# Patient Record
Sex: Female | Born: 1965
Health system: Southern US, Community
[De-identification: ages and names within clinical notes are randomized; demographics above are authoritative.]

## PROBLEM LIST (undated history)

## (undated) DIAGNOSIS — I10 Essential (primary) hypertension: Secondary | ICD-10-CM

## (undated) DIAGNOSIS — T4145XA Adverse effect of unspecified anesthetic, initial encounter: Secondary | ICD-10-CM

## (undated) DIAGNOSIS — E785 Hyperlipidemia, unspecified: Secondary | ICD-10-CM

## (undated) DIAGNOSIS — G709 Myoneural disorder, unspecified: Secondary | ICD-10-CM

## (undated) DIAGNOSIS — T8859XA Other complications of anesthesia, initial encounter: Secondary | ICD-10-CM

## (undated) HISTORY — DX: Essential (primary) hypertension: I10

## (undated) HISTORY — PX: FACIAL RECONSTRUCTION SURGERY: SHX631

## (undated) HISTORY — PX: BACK SURGERY: SHX140

## (undated) HISTORY — PX: OTHER SURGICAL HISTORY: SHX169

## (undated) HISTORY — DX: Hyperlipidemia, unspecified: E78.5

---
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
## 2005-04-07 ENCOUNTER — Ambulatory Visit (HOSPITAL_COMMUNITY): Admission: RE | Admit: 2005-04-07 | Discharge: 2005-04-07 | Payer: Self-pay | Admitting: Gynecology

## 2006-06-04 ENCOUNTER — Emergency Department: Payer: Self-pay | Admitting: Emergency Medicine

## 2006-07-15 ENCOUNTER — Ambulatory Visit: Payer: Self-pay | Admitting: Obstetrics & Gynecology

## 2007-03-04 ENCOUNTER — Ambulatory Visit: Payer: Self-pay | Admitting: Family Medicine

## 2007-10-27 ENCOUNTER — Ambulatory Visit: Payer: Self-pay | Admitting: Obstetrics and Gynecology

## 2007-10-27 ENCOUNTER — Other Ambulatory Visit: Payer: Self-pay

## 2007-11-04 ENCOUNTER — Ambulatory Visit: Payer: Self-pay | Admitting: Obstetrics and Gynecology

## 2008-04-02 ENCOUNTER — Emergency Department: Payer: Self-pay | Admitting: Emergency Medicine

## 2008-12-30 ENCOUNTER — Ambulatory Visit: Payer: Self-pay | Admitting: Internal Medicine

## 2009-04-17 ENCOUNTER — Ambulatory Visit: Payer: Self-pay | Admitting: Family Medicine

## 2009-05-29 ENCOUNTER — Other Ambulatory Visit: Payer: Self-pay | Admitting: Internal Medicine

## 2009-07-02 ENCOUNTER — Ambulatory Visit: Payer: Self-pay | Admitting: Internal Medicine

## 2009-09-03 ENCOUNTER — Other Ambulatory Visit: Payer: Self-pay | Admitting: Internal Medicine

## 2009-09-26 ENCOUNTER — Ambulatory Visit: Payer: Self-pay | Admitting: Internal Medicine

## 2009-12-11 ENCOUNTER — Ambulatory Visit: Payer: Self-pay

## 2009-12-16 ENCOUNTER — Ambulatory Visit: Payer: Self-pay | Admitting: General Practice

## 2010-01-08 ENCOUNTER — Ambulatory Visit: Payer: Self-pay | Admitting: Internal Medicine

## 2010-01-09 ENCOUNTER — Ambulatory Visit: Payer: Self-pay | Admitting: Internal Medicine

## 2010-03-13 LAB — HM PAP SMEAR

## 2010-04-24 ENCOUNTER — Ambulatory Visit: Payer: Self-pay | Admitting: Internal Medicine

## 2010-06-17 ENCOUNTER — Emergency Department: Payer: Self-pay | Admitting: Emergency Medicine

## 2010-10-23 ENCOUNTER — Ambulatory Visit: Payer: Self-pay | Admitting: Family Medicine

## 2010-12-31 ENCOUNTER — Ambulatory Visit: Payer: Self-pay | Admitting: Internal Medicine

## 2011-03-05 ENCOUNTER — Ambulatory Visit: Payer: Self-pay | Admitting: Family Medicine

## 2011-04-17 ENCOUNTER — Other Ambulatory Visit: Payer: Self-pay | Admitting: Internal Medicine

## 2011-04-17 ENCOUNTER — Telehealth: Payer: Self-pay | Admitting: Internal Medicine

## 2011-04-17 ENCOUNTER — Ambulatory Visit: Payer: Self-pay | Admitting: Internal Medicine

## 2011-04-17 NOTE — Telephone Encounter (Signed)
Can you please ask her to set up appointment?

## 2011-04-17 NOTE — Telephone Encounter (Signed)
[phone] fax [phone], they close at 1:00

## 2011-04-17 NOTE — Telephone Encounter (Signed)
LM for patient to CB to make an appt

## 2011-04-17 NOTE — Telephone Encounter (Signed)
Please send xray orders to [facility] for patient's wrist

## 2011-04-30 NOTE — Telephone Encounter (Signed)
Patient has an OV on 05/06/11

## 2011-05-04 ENCOUNTER — Other Ambulatory Visit: Payer: Self-pay | Admitting: Internal Medicine

## 2011-05-04 MED ORDER — LISINOPRIL 10 MG PO TABS: 10.0000 mg | ORAL_TABLET | Freq: Every day | ORAL | Status: DC

## 2011-05-05 ENCOUNTER — Ambulatory Visit: Payer: Self-pay | Admitting: Internal Medicine

## 2011-05-06 ENCOUNTER — Ambulatory Visit: Payer: Self-pay | Admitting: Internal Medicine

## 2011-05-07 ENCOUNTER — Ambulatory Visit: Payer: Self-pay | Admitting: Internal Medicine

## 2011-05-14 ENCOUNTER — Ambulatory Visit: Payer: Self-pay | Admitting: Internal Medicine

## 2011-05-21 ENCOUNTER — Encounter: Payer: Self-pay | Admitting: Internal Medicine

## 2011-05-21 ENCOUNTER — Ambulatory Visit (INDEPENDENT_AMBULATORY_CARE_PROVIDER_SITE_OTHER): Payer: PRIVATE HEALTH INSURANCE | Admitting: Internal Medicine

## 2011-05-21 DIAGNOSIS — E663 Overweight: Secondary | ICD-10-CM

## 2011-05-21 DIAGNOSIS — Z6830 Body mass index (BMI) 30.0-30.9, adult: Secondary | ICD-10-CM | POA: Insufficient documentation

## 2011-05-21 DIAGNOSIS — E119 Type 2 diabetes mellitus without complications: Secondary | ICD-10-CM | POA: Insufficient documentation

## 2011-05-21 MED ORDER — PHENTERMINE HCL 37.5 MG PO CAPS: 37.5000 mg | ORAL_CAPSULE | ORAL | Status: DC

## 2011-05-21 NOTE — Patient Instructions (Signed)
Calorie Counting Diet A calorie counting diet requires you to eat the number of calories that are right for you during a day. Calories are the measurement of how much energy you get from the food you eat. Eating the right amount of calories is important for staying at a healthy weight. If you eat too many calories your body will store them as fat and you may gain weight. If you eat too few calories you may lose weight. Counting the number of calories that you eat during a day will help you to know if you're eating the right amount. A Registered Dietitian can determine how many calories you need in a day. The amount of calories you need varies from person to person. If your goal is to lose weight you will need to eat fewer calories. Losing weight can benefit you if you are overweight or have health problems such as heart disease, high blood pressure or diabetes. If your goal is to gain weight, you will need to eat more calories. Gaining weight may be necessary if you have a certain health problem that causes your body to need more energy. TIPS Whether you are increasing or decreasing the number of calories you eat during a day, it may be hard to get used to changing what you eat and drink. The following are tips to help you keep track of the number of calories you are eating.  Measuring foods at home with measuring cups will help you to know the actual amount of food and number of calories you are eating.   Restaurants serve food in all different portion sizes. It is common that restaurants will serve food in amounts worth 2 or more serving sizes. While eating out, it may be helpful to estimate how many servings of a food you are given. For example, a serving of cooked rice is 1/2 cup and that is the size of half of a fist. Knowing serving sizes will help you have a better idea of how much food you are eating at restaurants.   Ask for smaller portion sizes or child-size portions at restaurants.   Plan to  eat half of a meal at a restaurant and take the rest home or share the other half with a friend   Read food labels for calorie content and serving size   Most packaged food has a Nutrition Facts Panel on its side or back. Here you can find out how many servings are in a package, the size of a serving, and the number of calories each serving has.   The serving size and number of servings per container are listed right below the Nutrition Facts heading. Just below the serving information, the number of calories in each serving is listed.   For example, say that a package has three cookies inside. The Nutrition Facts panel says that one serving is one cookie. Below that, it says that there are three servings in the container. The calories section of the Nutrition Facts says there are 90 calories. That means that there are 90 calories in one cookie. If you eat one cookie you have eaten 90 calories. If you eat all three cookies, you have eaten three times that amount, or 270 calories.  The list below tells you how big or small some common portion sizes are.  1 ounce (oz).................4 stacked dice.   3 oz..............................Deck of cards.   1 teaspoon (tsp)...........Tip of little finger.   1 tablespoon (Tbsp)....Tip of thumb.     2 Tbsp..........................Golf ball.    Cup..........................Half of a fist.   1 Cup...........................A fist.  KEEP A FOOD LOG Write down every food item that you eat, how much of the food you eat, and the number of calories in each food that you eat during the day. At the end of the day or throughout the day you can add up the total number of calories you have eaten.  It may help to set up a list like the one below. Find out the calorie information by reading food labels.  Breakfast   Bran Flakes (1 cup, 110 calories).   Fat free milk ( cup, 45 calories).   Snack   Apple (1 medium, 80 calories).   Lunch   Spinach (1  cup, 20 calories).   Tomato ( medium, 20 calories).   Chicken breast strips (3 oz, 165 calories).   Shredded cheddar cheese ( cup, 110 calories).   Light Italian dressing (2 Tbsp, 60 calories).   Whole wheat bread (1 slice, 80 calories).   Tub margarine (1 tsp, 35 calories).   Vegetable soup (1 cup, 160 calories).   Dinner   Pork chop (3 oz, 190 calories).   Brown rice (1 cup, 215 calories).   Steamed broccoli ( cup, 20 calories).   Strawberries (1  cup, 65 calories).   Whipped cream (1 Tbsp, 50 calories).  Daily Calorie Total: 1425 Information from www.eatright.org, Foodwise Nutritional Analysis Database. Document Released: 08/03/2005 Document Re-Released: 08/25/2009 Exit[employer] Patient Information 2011 [employer]. 

## 2011-05-21 NOTE — Progress Notes (Signed)
Subjective:    Patient ID: Alyssa Schultz, female    DOB: 17-Feb-1966, 45 y.o.   MRN: [id number]  HPI 45YO female with history of DM and obesity presents for follow up. Notes recent increase in blood sugars and weight in setting of working two jobs. Has had decreased exercise with some dietary indiscretions.  Has plan to get back on track. Will limit time at work and has been running again both before and after work.  Outpatient Encounter Prescriptions as of 05/21/2011  Medication Sig Dispense Refill  . lisinopril (PRINIVIL) 10 MG tablet Take 1 tablet (10 mg total) by mouth daily.  30 tablet  6    Review of Systems  Constitutional: Negative for fever, chills, appetite change, fatigue and unexpected weight change.  HENT: Negative for ear pain, congestion, sore throat, trouble swallowing, neck pain, voice change and sinus pressure.   Eyes: Negative for visual disturbance.  Respiratory: Negative for cough, shortness of breath, wheezing and stridor.   Cardiovascular: Negative for chest pain, palpitations and leg swelling.  Gastrointestinal: Negative for nausea, vomiting, abdominal pain, diarrhea, constipation, blood in stool, abdominal distention and anal bleeding.  Genitourinary: Negative for dysuria and flank pain.  Musculoskeletal: Negative for myalgias, arthralgias and gait problem.  Skin: Negative for color change and rash.  Neurological: Negative for dizziness and headaches.  Hematological: Negative for adenopathy. Does not bruise/bleed easily.  Psychiatric/Behavioral: Negative for suicidal ideas, sleep disturbance and dysphoric mood. The patient is not nervous/anxious.    BP 118/80  Pulse 81  Temp(Src) 98.1 F (36.7 C) (Oral)  Resp 12  Ht 5\' 4"  (1.626 m)  Wt 180 lb (81.647 kg)  BMI 30.90 kg/m2  SpO2 99%     Objective:   Physical Exam  Constitutional: She is oriented to person, place, and time. She appears well-developed and well-nourished. No distress.  HENT:  Head:  Normocephalic and atraumatic.  Right Ear: External ear normal.  Left Ear: External ear normal.  Nose: Nose normal.  Mouth/Throat: Oropharynx is clear and moist. No oropharyngeal exudate.  Eyes: Conjunctivae are normal. Pupils are equal, round, and reactive to light. Right eye exhibits no discharge. Left eye exhibits no discharge. No scleral icterus.  Neck: Normal range of motion. Neck supple. No tracheal deviation present. No thyromegaly present.  Cardiovascular: Normal rate, regular rhythm, normal heart sounds and intact distal pulses.  Exam reveals no gallop and no friction rub.   No murmur heard. Pulmonary/Chest: Effort normal and breath sounds normal. No respiratory distress. She has no wheezes. She has no rales. She exhibits no tenderness.  Musculoskeletal: Normal range of motion. She exhibits no edema and no tenderness.  Lymphadenopathy:    She has no cervical adenopathy.  Neurological: She is alert and oriented to person, place, and time. No cranial nerve deficit. She exhibits normal muscle tone. Coordination normal.  Skin: Skin is warm and dry. No rash noted. She is not diaphoretic. No erythema. No pallor.  Psychiatric: She has a normal mood and affect. Her behavior is normal. Judgment and thought content normal.          Assessment & Plan:  1. Diabetes - Recent worsened control in setting of dietary indiscretions and decreased physical activity. HgbA1c 7% on recent hospital labs. Will focus on diet and exercise, recheck HgbA1c in 3 months. If no improvement, restart metformin.  2. Overweight - Weight recently increased with BMI of 30. Has plan for diet and exercise. Will restart phentermine with plan to use for 1-3 months.  Tolerated this well in the past. We reviewed potential side effects. RTC in 1 month.

## 2011-06-05 ENCOUNTER — Encounter: Payer: Self-pay | Admitting: Internal Medicine

## 2011-06-16 ENCOUNTER — Other Ambulatory Visit: Payer: Self-pay | Admitting: Internal Medicine

## 2011-06-17 ENCOUNTER — Telehealth: Payer: Self-pay | Admitting: Internal Medicine

## 2011-06-17 NOTE — Telephone Encounter (Signed)
Pt called, she would like to get a rx for not sure what it was called but it was for bacteria build up for order in private area.  [facility] [phone] Pt called [facility] to see if they would give her the name of the rx, they told her to call here

## 2011-06-19 ENCOUNTER — Telehealth: Payer: Self-pay | Admitting: Internal Medicine

## 2011-06-19 DIAGNOSIS — N760 Acute vaginitis: Secondary | ICD-10-CM

## 2011-06-19 MED ORDER — METRONIDAZOLE 0.75 % VA GEL: 1.0000 | Freq: Two times a day (BID) | VAGINAL | Status: AC

## 2011-06-19 NOTE — Telephone Encounter (Signed)
Left VM for patient to call me back.

## 2011-06-25 ENCOUNTER — Encounter: Payer: Self-pay | Admitting: Internal Medicine

## 2011-06-25 ENCOUNTER — Ambulatory Visit: Payer: Self-pay | Admitting: Internal Medicine

## 2011-07-02 ENCOUNTER — Ambulatory Visit: Payer: Self-pay

## 2011-07-08 ENCOUNTER — Encounter: Payer: Self-pay | Admitting: Internal Medicine

## 2011-07-13 ENCOUNTER — Ambulatory Visit (INDEPENDENT_AMBULATORY_CARE_PROVIDER_SITE_OTHER): Payer: PRIVATE HEALTH INSURANCE | Admitting: Internal Medicine

## 2011-07-13 ENCOUNTER — Encounter: Payer: Self-pay | Admitting: Internal Medicine

## 2011-07-13 ENCOUNTER — Telehealth: Payer: Self-pay | Admitting: Internal Medicine

## 2011-07-13 VITALS — BP 138/84 | HR 72 | Temp 98.6°F | Wt 183.0 lb

## 2011-07-13 DIAGNOSIS — R1013 Epigastric pain: Secondary | ICD-10-CM

## 2011-07-13 LAB — COMPREHENSIVE METABOLIC PANEL
AST: 23 U/L (ref 0–37)
Alkaline Phosphatase: 52 U/L (ref 39–117)
BUN: 13 mg/dL (ref 6–23)
CO2: 25 mEq/L (ref 19–32)
Chloride: 109 mEq/L (ref 96–112)
Creatinine, Ser: 0.8 mg/dL (ref 0.4–1.2)
GFR: 96.1 mL/min (ref >60.00)
Glucose, Bld: 93 mg/dL (ref 70–99)
Potassium: 4.1 mEq/L (ref 3.5–5.1)
Sodium: 142 mEq/L (ref 135–145)
Total Bilirubin: 0.7 mg/dL (ref 0.3–1.2)

## 2011-07-13 LAB — CBC WITH DIFFERENTIAL/PLATELET
Basophils Absolute: 0 10*3/uL (ref 0.0–0.1)
Hemoglobin: 13.1 g/dL (ref 12.0–15.0)
MCHC: 33.4 g/dL (ref 30.0–36.0)
MCV: 89.7 fl (ref 78.0–100.0)
Monocytes Absolute: 0.6 10*3/uL (ref 0.1–1.0)
Neutro Abs: 4.8 10*3/uL (ref 1.4–7.7)
Platelets: 223 10*3/uL (ref 150.0–400.0)

## 2011-07-13 MED ORDER — DEXLANSOPRAZOLE 60 MG PO CPDR: 60.0000 mg | DELAYED_RELEASE_CAPSULE | Freq: Every day | ORAL | Status: DC

## 2011-07-13 NOTE — Progress Notes (Signed)
  Subjective:    Patient ID: Alyssa Schultz, female    DOB: [birth year], 45 y.o.   MRN: [id number]  HPI 45YO female with DM presents for acute visit c/o epigastric abdominal pain x 1 month. Pain described as severe and burning. Worse after eating. Associated with some loose dark black stool on occasion. No bright red blood in stool. No nausea or vomiting. Pt was seen at urgent care, started on omeprazole with no improvement.  Notes some improvement with bland liquid diet. Denies fever or chills.  Outpatient Encounter Prescriptions as of 07/13/2011  Medication Sig Dispense Refill  . lisinopril (PRINIVIL) 10 MG tablet Take 1 tablet (10 mg total) by mouth daily.  30 tablet  6  . dexlansoprazole (DEXILANT) 60 MG capsule Take 1 capsule (60 mg total) by mouth daily.  30 capsule  6    Review of Systems  Constitutional: Positive for appetite change. Negative for fever, chills, diaphoresis, fatigue and unexpected weight change.  Gastrointestinal: Positive for abdominal pain, diarrhea, constipation and abdominal distention. Negative for nausea, vomiting, blood in stool, anal bleeding and rectal pain.  Skin: Negative for color change.   BP 138/84  Pulse 72  Temp(Src) 98.6 F (37 C) (Oral)  Wt 183 lb (83.008 kg)  SpO2 98%     Objective:   Physical Exam  Constitutional: She is oriented to person, place, and time. She appears well-developed and well-nourished. No distress.  HENT:  Head: Normocephalic and atraumatic.  Right Ear: External ear normal.  Left Ear: External ear normal.  Nose: Nose normal.  Mouth/Throat: Oropharynx is clear and moist. No oropharyngeal exudate.  Eyes: Conjunctivae are normal. Pupils are equal, round, and reactive to light. Right eye exhibits no discharge. Left eye exhibits no discharge. No scleral icterus.  Neck: Normal range of motion. Neck supple. No tracheal deviation present. No thyromegaly present.  Cardiovascular: Normal rate, regular rhythm, normal heart sounds  and intact distal pulses.  Exam reveals no gallop and no friction rub.   No murmur heard. Pulmonary/Chest: Effort normal and breath sounds normal. No respiratory distress. She has no wheezes. She has no rales. She exhibits no tenderness.  Abdominal: She exhibits no distension and no mass. There is tenderness (epigastric). There is no rebound and no guarding.  Musculoskeletal: Normal range of motion. She exhibits no edema and no tenderness.  Lymphadenopathy:    She has no cervical adenopathy.  Neurological: She is alert and oriented to person, place, and time. No cranial nerve deficit. She exhibits normal muscle tone. Coordination normal.  Skin: Skin is warm and dry. No rash noted. She is not diaphoretic. No erythema. No pallor.  Psychiatric: She has a normal mood and affect. Her behavior is normal. Judgment and thought content normal.          Assessment & Plan:  1. Abdominal Pain - Suspect gastritis vs ulcer. Stool hemoccult neg today. Will check H. Pylori. Will start Dexilant.  Continue bland diet. Will set up GI referral for endoscopy. Follow up 2 weeks.

## 2011-07-13 NOTE — Telephone Encounter (Signed)
Pt said [facility] will not fill rx this needs pre authorization.   Please advise pt when this is done

## 2011-07-13 NOTE — Telephone Encounter (Signed)
Left mess to call office back. Unsure name of med.

## 2011-07-14 NOTE — Telephone Encounter (Signed)
Pt needs PA on Dexilant. She has tried and failed omeprazole. I explained the PA process to patient and will inform her when we hear back from insurance.

## 2011-07-14 NOTE — Telephone Encounter (Signed)
Called PA number and was required to leave a detailed VM w/request. I left VM requesting that PA form for Dexilant be faxed to office.

## 2011-07-15 ENCOUNTER — Telehealth: Payer: Self-pay | Admitting: Internal Medicine

## 2011-07-15 DIAGNOSIS — R1013 Epigastric pain: Secondary | ICD-10-CM

## 2011-07-15 MED ORDER — DEXLANSOPRAZOLE 60 MG PO CPDR: 60.0000 mg | DELAYED_RELEASE_CAPSULE | Freq: Every day | ORAL | Status: AC

## 2011-07-15 NOTE — Telephone Encounter (Signed)
Patient called in states she is feeling so much better, she is doing the clear liquid diet, she hasn't started the medication yet. If you need to call her please use her work number. She does know that she can't stay on the liquid diet forever.   By Lysbeth Galas   PA for Dexilant is in process, pt is aware of this. I gave her 10 days of samples. She will continue as directed and keep f/u apt.

## 2011-07-15 NOTE — Telephone Encounter (Signed)
Now that we got samples of Dexilant, lets have her start those. GI appt is being scheduled.

## 2011-07-20 ENCOUNTER — Encounter: Payer: Self-pay | Admitting: Internal Medicine

## 2011-07-20 ENCOUNTER — Ambulatory Visit (INDEPENDENT_AMBULATORY_CARE_PROVIDER_SITE_OTHER): Payer: PRIVATE HEALTH INSURANCE | Admitting: Internal Medicine

## 2011-07-20 VITALS — BP 120/82 | HR 78 | Temp 97.9°F | Wt 177.0 lb

## 2011-07-20 DIAGNOSIS — R1013 Epigastric pain: Secondary | ICD-10-CM

## 2011-07-20 NOTE — Progress Notes (Signed)
  Subjective:    Patient ID: Alyssa Schultz, female    DOB: 1966-05-12, 45 y.o.   MRN: [id number]  HPI 45YO female with DM presents to follow up recent episode of epigastric abdominal pain.  Pt reports pain is improved with use of Dexilant, but not resolved. Pt continues on liquid diet. Deviated once by eating hash browns and sausage and had severe epigastric pain and diarrhea. Denies fever, chills, blood in stool or black stool. Has not yet been seen by GI.   Outpatient Encounter Prescriptions as of 07/20/2011  Medication Sig Dispense Refill  . dexlansoprazole (DEXILANT) 60 MG capsule Take 1 capsule (60 mg total) by mouth daily.  10 capsule  0  . lisinopril (PRINIVIL) 10 MG tablet Take 1 tablet (10 mg total) by mouth daily.  30 tablet  6    Review of Systems  Constitutional: Positive for appetite change. Negative for fever, chills, fatigue and unexpected weight change.  HENT: Negative for ear pain, congestion, sore throat, trouble swallowing, neck pain, voice change and sinus pressure.   Eyes: Negative for visual disturbance.  Respiratory: Negative for cough and shortness of breath.   Cardiovascular: Negative for chest pain, palpitations and leg swelling.  Gastrointestinal: Positive for abdominal pain and diarrhea. Negative for nausea, vomiting, constipation, blood in stool, abdominal distention and anal bleeding.  Musculoskeletal: Negative for myalgias, arthralgias and gait problem.  Skin: Negative for color change and rash.  Neurological: Negative for dizziness.  Hematological: Negative for adenopathy. Does not bruise/bleed easily.  Psychiatric/Behavioral: Negative for suicidal ideas, sleep disturbance and dysphoric mood. The patient is not nervous/anxious.    BP 120/82  Pulse 78  Temp(Src) 97.9 F (36.6 C) (Oral)  Wt 177 lb (80.287 kg)  SpO2 97%     Objective:   Physical Exam  Constitutional: She is oriented to person, place, and time. She appears well-developed and  well-nourished. No distress.  HENT:  Head: Normocephalic and atraumatic.  Right Ear: External ear normal.  Left Ear: External ear normal.  Nose: Nose normal.  Mouth/Throat: Oropharynx is clear and moist. No oropharyngeal exudate.  Eyes: Conjunctivae are normal. Pupils are equal, round, and reactive to light. Right eye exhibits no discharge. Left eye exhibits no discharge. No scleral icterus.  Neck: Normal range of motion. Neck supple. No tracheal deviation present. No thyromegaly present.  Pulmonary/Chest: Effort normal.  Abdominal: Soft. She exhibits no distension and no mass. There is tenderness (epigastric). There is no rebound and no guarding.  Musculoskeletal: Normal range of motion. She exhibits no edema and no tenderness.  Lymphadenopathy:    She has no cervical adenopathy.  Neurological: She is alert and oriented to person, place, and time. No cranial nerve deficit. She exhibits normal muscle tone. Coordination normal.  Skin: Skin is warm and dry. No rash noted. She is not diaphoretic. No erythema. No pallor.  Psychiatric: She has a normal mood and affect. Her behavior is normal. Judgment and thought content normal.          Assessment & Plan:  1. Epigastric Abdominal Pain - Improved with Dexilant, but not resolved. H. Pylori negative.  Concerning for gastric ulcer. Will set her up with GI for endoscopy. Follow up here in 28month.

## 2011-08-14 NOTE — Telephone Encounter (Signed)
Called again and requested form to be faxed

## 2011-08-17 ENCOUNTER — Telehealth: Payer: Self-pay | Admitting: Internal Medicine

## 2011-08-17 NOTE — Telephone Encounter (Signed)
Until 12 today [phone]   After 12    [phone]  Pt called to see if you would call in her an anti fungal cream she has a fungal on the back of her right and it is coming on the left side.  Its really really  Red and burning.  She also has a spot on her nose [facility] [place]

## 2011-08-17 NOTE — Telephone Encounter (Signed)
Spoke with patient and she is going to go to urgent care to have the possible fungus checked.

## 2011-08-17 NOTE — Telephone Encounter (Signed)
Pt was also checking on meds that needed pre autho

## 2011-08-20 NOTE — Telephone Encounter (Signed)
Form received, completed and pending signature from MD 

## 2011-08-21 ENCOUNTER — Ambulatory Visit: Payer: PRIVATE HEALTH INSURANCE | Admitting: Internal Medicine

## 2011-08-24 ENCOUNTER — Ambulatory Visit: Payer: PRIVATE HEALTH INSURANCE | Admitting: Internal Medicine

## 2011-08-27 MED ORDER — DEXLANSOPRAZOLE 60 MG PO CPDR: 60.0000 mg | DELAYED_RELEASE_CAPSULE | Freq: Every day | ORAL | Status: AC

## 2011-08-27 NOTE — Telephone Encounter (Signed)
MD back in the office today, Form faxed

## 2011-08-27 NOTE — Telephone Encounter (Signed)
Insurance faxed back, med approved thru next year. Sent mess to pharm

## 2011-09-02 ENCOUNTER — Telehealth: Payer: Self-pay | Admitting: Internal Medicine

## 2011-09-02 NOTE — Telephone Encounter (Signed)
Please call [phone]  Pt is out of  dexalant can she get more samples.  And she also checking on prior auth Pt stated place on arm is doing better.  She thinks she has yeast   She put over the counter aniti fungual cream this helped it but didn't clear all the way up  Can you call something in for she [facility]

## 2011-09-02 NOTE — Telephone Encounter (Signed)
1. PA approved, pt is aware  2. She is c/o rash x 3 wks on edge of her armpit. She says it is exactly same as when she had fungal infection on her stomach. Pt has been using OTC antifungal cream bid x 2 wks. Area is no longer as red or painful but has not cleared completely. She would like prescription cream if possible.

## 2011-09-02 NOTE — Telephone Encounter (Signed)
Patient informed, She has been using generic lotrimin but wants to try brand and will come in if not better.

## 2011-09-02 NOTE — Telephone Encounter (Signed)
She can use OTC Lotrimin bid x 7 days for rash. If no improvement, should be seen.

## 2011-09-04 ENCOUNTER — Ambulatory Visit: Payer: PRIVATE HEALTH INSURANCE | Admitting: Internal Medicine

## 2011-10-01 ENCOUNTER — Encounter: Payer: Self-pay | Admitting: Internal Medicine

## 2011-10-01 ENCOUNTER — Ambulatory Visit (INDEPENDENT_AMBULATORY_CARE_PROVIDER_SITE_OTHER): Payer: PRIVATE HEALTH INSURANCE | Admitting: Internal Medicine

## 2011-10-01 VITALS — BP 126/80 | HR 100 | Temp 98.5°F | Ht 65.0 in | Wt 180.0 lb

## 2011-10-01 DIAGNOSIS — J020 Streptococcal pharyngitis: Secondary | ICD-10-CM | POA: Insufficient documentation

## 2011-10-01 DIAGNOSIS — H6690 Otitis media, unspecified, unspecified ear: Secondary | ICD-10-CM

## 2011-10-01 DIAGNOSIS — R21 Rash and other nonspecific skin eruption: Secondary | ICD-10-CM

## 2011-10-01 DIAGNOSIS — E663 Overweight: Secondary | ICD-10-CM

## 2011-10-01 DIAGNOSIS — H6692 Otitis media, unspecified, left ear: Secondary | ICD-10-CM

## 2011-10-01 MED ORDER — AMOXICILLIN-POT CLAVULANATE 875-125 MG PO TABS: 1.0000 | ORAL_TABLET | Freq: Two times a day (BID) | ORAL | Status: AC

## 2011-10-01 MED ORDER — PHENTERMINE HCL 37.5 MG PO CAPS: 37.5000 mg | ORAL_CAPSULE | ORAL | Status: DC

## 2011-10-01 NOTE — Progress Notes (Signed)
Subjective:    Patient ID: Alyssa Schultz, female    DOB: May 17, 1966, 46 y.o.   MRN: [id number]  HPI 46YO female with h/o DM presents for acute visit c/o sore throat and chills x 2 days.  Also notes bilateral ear pain and mild clear nasal drainage. Works at hospital, so numerous sick contacts. Has been taking OTC cold preparations with no improvement. Denies cough, dyspnea, chest pain or other concerns.  Also has h/o being overweight. Used phentermine in the past to help with appetite suppression with some improvement. Would like to start back on diet/exercise plan and use phentermine again after returning from vacation next week.  She is also concerned today about recent rash in her right axilla. She notes that the rash was red and itchy. She applied Lotrimin cream daily over the last 2 weeks and reports significant improvement in her symptoms.  Outpatient Encounter Prescriptions as of 10/01/2011  Medication Sig Dispense Refill  . amoxicillin-clavulanate (AUGMENTIN) 875-125 MG per tablet Take 1 tablet by mouth 2 (two) times daily.  20 tablet  0  . lisinopril (PRINIVIL) 10 MG tablet Take 1 tablet (10 mg total) by mouth daily.  30 tablet  6  . phentermine 37.5 MG capsule Take 1 capsule (37.5 mg total) by mouth every morning.  30 capsule  1    Review of Systems  Constitutional: Positive for chills. Negative for fever and unexpected weight change.  HENT: Positive for ear pain, congestion, sore throat and postnasal drip. Negative for hearing loss, nosebleeds, facial swelling, rhinorrhea, sneezing, mouth sores, trouble swallowing, neck pain, neck stiffness, voice change, sinus pressure, tinnitus and ear discharge.   Eyes: Negative for pain, discharge, redness and visual disturbance.  Respiratory: Negative for cough, chest tightness, shortness of breath, wheezing and stridor.   Cardiovascular: Negative for chest pain, palpitations and leg swelling.  Musculoskeletal: Negative for myalgias and  arthralgias.  Skin: Negative for color change and rash.  Neurological: Negative for dizziness, weakness, light-headedness and headaches.  Hematological: Negative for adenopathy.   BP 126/80  Pulse 100  Temp(Src) 98.5 F (36.9 C) (Oral)  Ht 5\' 5"  (1.651 m)  Wt 180 lb (81.647 kg)  BMI 29.95 kg/m2  SpO2 100%     Objective:   Physical Exam  Constitutional: She is oriented to person, place, and time. She appears well-developed and well-nourished. No distress.  HENT:  Head: Normocephalic and atraumatic.  Right Ear: External ear normal. Tympanic membrane is not erythematous and not bulging. A middle ear effusion is present.  Left Ear: External ear normal. Tympanic membrane is erythematous and bulging. A middle ear effusion is present.  Nose: Nose normal.  Mouth/Throat: Oropharyngeal exudate and posterior oropharyngeal erythema present.  Eyes: Conjunctivae are normal. Pupils are equal, round, and reactive to light. Right eye exhibits no discharge. Left eye exhibits no discharge. No scleral icterus.  Neck: Normal range of motion. Neck supple. No tracheal deviation present. No thyromegaly present.  Cardiovascular: Normal rate, regular rhythm, normal heart sounds and intact distal pulses.  Exam reveals no gallop and no friction rub.   No murmur heard. Pulmonary/Chest: Effort normal and breath sounds normal. No respiratory distress. She has no wheezes. She has no rales. She exhibits no tenderness.  Musculoskeletal: Normal range of motion. She exhibits no edema and no tenderness.  Lymphadenopathy:    She has no cervical adenopathy.  Neurological: She is alert and oriented to person, place, and time. No cranial nerve deficit. She exhibits normal muscle tone. Coordination normal.  Skin: Skin is warm and dry. No rash noted. She is not diaphoretic. No erythema. No pallor.  Psychiatric: She has a normal mood and affect. Her behavior is normal. Judgment and thought content normal.            Assessment & Plan:

## 2011-10-01 NOTE — Assessment & Plan Note (Signed)
Will restart phentermine when pt returns from vacation. Goal <1500 calories per day with 30-57min aerobic exercise daily.  Goal weight loss 1-2lbs per week.  Follow up 1 month.

## 2011-10-01 NOTE — Assessment & Plan Note (Signed)
Symptoms and exam c/w strep pharyngitis.  Will treat with augmentin x 10 days. Ibuprofen and tylenol prn pain. Pt will call if symptoms not improving or worsening.

## 2011-10-01 NOTE — Assessment & Plan Note (Addendum)
Pt concerned about rash in right axilla. No rash visible today, however symptoms described seem consistent with candidiasis. Will continue to monitor for recurrence.

## 2011-10-02 ENCOUNTER — Telehealth: Payer: Self-pay | Admitting: Internal Medicine

## 2011-10-02 NOTE — Telephone Encounter (Signed)
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 She says that she is not feeling any better today and will not be going in to work.

## 2011-10-02 NOTE — Telephone Encounter (Signed)
[phone] Pt called needs not to employer still not better today has fever Fax coach [phone] [facility] in [place]  [phone]  Pt is not feeling any better

## 2011-10-02 NOTE — Telephone Encounter (Signed)
Letter faxed to both numbers below.

## 2011-10-09 ENCOUNTER — Telehealth: Payer: Self-pay | Admitting: Internal Medicine

## 2011-10-09 MED ORDER — FLUCONAZOLE 150 MG PO TABS: 150.0000 mg | ORAL_TABLET | Freq: Every day | ORAL | Status: AC

## 2011-10-09 NOTE — Telephone Encounter (Signed)
Call her at work [phone]

## 2011-10-09 NOTE — Telephone Encounter (Signed)
Ok to call in Fluconazole 150 mg daily x 2 days  #2   Pick another pharmacy [facility] closes at 5?

## 2011-10-09 NOTE — Telephone Encounter (Signed)
Pt thinks she has a yeast infection from taking her anti bodic.  Can you call her in something for this [facility] in [facility]

## 2011-10-10 MED ORDER — FLUCONAZOLE 150 MG PO TABS: 150.0000 mg | ORAL_TABLET | Freq: Every day | ORAL | Status: AC

## 2011-10-10 NOTE — Telephone Encounter (Signed)
Patient informed. 

## 2011-10-10 NOTE — Telephone Encounter (Signed)
Agree. Ok to call in

## 2011-11-02 ENCOUNTER — Ambulatory Visit: Payer: Self-pay

## 2011-11-02 LAB — URINALYSIS, COMPLETE
Bilirubin,UR: NEGATIVE
Ketone: NEGATIVE
Ph: 6.5 (ref 4.5–8.0)

## 2011-11-02 LAB — RAPID STREP-A WITH REFLX: Micro Text Report: NEGATIVE

## 2011-11-04 ENCOUNTER — Ambulatory Visit: Payer: PRIVATE HEALTH INSURANCE | Admitting: Internal Medicine

## 2011-11-04 LAB — BETA STREP CULTURE(ARMC)

## 2011-11-04 LAB — URINE CULTURE

## 2011-11-09 ENCOUNTER — Encounter: Payer: Self-pay | Admitting: Internal Medicine

## 2011-11-09 ENCOUNTER — Ambulatory Visit (INDEPENDENT_AMBULATORY_CARE_PROVIDER_SITE_OTHER): Payer: PRIVATE HEALTH INSURANCE | Admitting: Internal Medicine

## 2011-11-09 DIAGNOSIS — E119 Type 2 diabetes mellitus without complications: Secondary | ICD-10-CM

## 2011-11-09 DIAGNOSIS — E663 Overweight: Secondary | ICD-10-CM

## 2011-11-09 DIAGNOSIS — I10 Essential (primary) hypertension: Secondary | ICD-10-CM

## 2011-11-09 DIAGNOSIS — N390 Urinary tract infection, site not specified: Secondary | ICD-10-CM

## 2011-11-09 LAB — POCT URINALYSIS DIPSTICK
Blood, UA: NEGATIVE
Nitrite, UA: NEGATIVE
Protein, UA: NEGATIVE

## 2011-11-09 MED ORDER — PHENTERMINE HCL 37.5 MG PO CAPS: 37.5000 mg | ORAL_CAPSULE | ORAL | Status: DC

## 2011-11-09 NOTE — Progress Notes (Addendum)
  Subjective:    Patient ID: Alyssa Schultz, female    DOB: 10/27/1965, 46 y.o.   MRN: [id number]  HPI 46YO female with h/o HTN, DM presents for follow up. Doing very well.  Running for exercise.  Following healthy diet.  Did not bring record of BG today. DM has been controlled with diet alone.   Recently treated for UTI at urgent care with resolution of symptoms. No concerns today.  Outpatient Encounter Prescriptions as of 11/09/2011  Medication Sig Dispense Refill  . lisinopril (PRINIVIL) 10 MG tablet Take 1 tablet (10 mg total) by mouth daily.  30 tablet  6  . phentermine 37.5 MG capsule Take 1 capsule (37.5 mg total) by mouth every morning.  30 capsule  1    Review of Systems  Constitutional: Negative for fever, chills, appetite change, fatigue and unexpected weight change.  HENT: Negative for ear pain, congestion, sore throat, trouble swallowing, neck pain, voice change and sinus pressure.   Eyes: Negative for visual disturbance.  Respiratory: Negative for cough, shortness of breath, wheezing and stridor.   Cardiovascular: Negative for chest pain, palpitations and leg swelling.  Gastrointestinal: Negative for nausea, vomiting, abdominal pain, diarrhea, constipation, blood in stool, abdominal distention and anal bleeding.  Genitourinary: Negative for dysuria and flank pain.  Musculoskeletal: Negative for myalgias, arthralgias and gait problem.  Skin: Negative for color change and rash.  Neurological: Negative for dizziness and headaches.  Hematological: Negative for adenopathy. Does not bruise/bleed easily.  Psychiatric/Behavioral: Negative for suicidal ideas, sleep disturbance and dysphoric mood. The patient is not nervous/anxious.    BP 112/82  Pulse 84  Wt 179 lb (81.194 kg)     Objective:   Physical Exam  Constitutional: She is oriented to person, place, and time. She appears well-developed and well-nourished. No distress.  HENT:  Head: Normocephalic and atraumatic.  Right  Ear: External ear normal.  Left Ear: External ear normal.  Nose: Nose normal.  Mouth/Throat: Oropharynx is clear and moist. No oropharyngeal exudate.  Eyes: Conjunctivae are normal. Pupils are equal, round, and reactive to light. Right eye exhibits no discharge. Left eye exhibits no discharge. No scleral icterus.  Neck: Normal range of motion. Neck supple. No tracheal deviation present. No thyromegaly present.  Cardiovascular: Normal rate, regular rhythm, normal heart sounds and intact distal pulses.  Exam reveals no gallop and no friction rub.   No murmur heard. Pulmonary/Chest: Effort normal and breath sounds normal. No respiratory distress. She has no wheezes. She has no rales. She exhibits no tenderness.  Musculoskeletal: Normal range of motion. She exhibits no edema and no tenderness.  Lymphadenopathy:    She has no cervical adenopathy.  Neurological: She is alert and oriented to person, place, and time. No cranial nerve deficit. She exhibits normal muscle tone. Coordination normal.  Skin: Skin is warm and dry. No rash noted. She is not diaphoretic. No erythema. No pallor.  Psychiatric: She has a normal mood and affect. Her behavior is normal. Judgment and thought content normal.          Assessment & Plan:

## 2011-11-09 NOTE — Assessment & Plan Note (Signed)
BP well controlled on lisinopril. Will check renal function with labs today.

## 2011-11-09 NOTE — Assessment & Plan Note (Signed)
Congratulated pt on weight loss and efforts at running. Follow up 3 months.

## 2011-11-09 NOTE — Assessment & Plan Note (Signed)
Will check A1c with labs today. 

## 2011-11-10 LAB — COMPREHENSIVE METABOLIC PANEL
Albumin: 4 g/dL (ref 3.5–5.2)
Alkaline Phosphatase: 56 U/L (ref 39–117)
CO2: 26 mEq/L (ref 19–32)
GFR: 119.1 mL/min (ref >60.00)
Glucose, Bld: 117 mg/dL — ABNORMAL HIGH (ref 70–99)
Sodium: 140 mEq/L (ref 135–145)

## 2011-11-10 LAB — LIPID PANEL
Cholesterol: 188 mg/dL (ref 0–200)
LDL Cholesterol: 100 mg/dL — ABNORMAL HIGH (ref 0–99)
Triglycerides: 126 mg/dL (ref 0.0–149.0)
VLDL: 25.2 mg/dL (ref 0.0–40.0)

## 2011-11-18 ENCOUNTER — Telehealth: Payer: Self-pay | Admitting: *Deleted

## 2011-11-18 NOTE — Telephone Encounter (Signed)
Patient was notified of lab results.

## 2011-11-22 ENCOUNTER — Emergency Department: Payer: Self-pay | Admitting: Emergency Medicine

## 2011-11-22 LAB — CBC
MCV: 89 fL (ref 80–100)
RBC: 5.01 10*6/uL (ref 3.80–5.20)
RDW: 14.3 % (ref 11.5–14.5)

## 2011-11-22 LAB — URINALYSIS, COMPLETE
Bilirubin,UR: NEGATIVE
Ketone: NEGATIVE
Leukocyte Esterase: NEGATIVE
Nitrite: NEGATIVE
Ph: 9 (ref 4.5–8.0)
Protein: NEGATIVE
RBC,UR: 1 /HPF (ref 0–5)
Squamous Epithelial: 4
WBC UR: <1 /HPF (ref 0–5)

## 2011-11-22 LAB — COMPREHENSIVE METABOLIC PANEL
Albumin: 4.1 g/dL (ref 3.4–5.0)
Alkaline Phosphatase: 66 U/L (ref 50–136)
Anion Gap: 9 (ref 7–16)
EGFR (African American): >60
EGFR (Non-African Amer.): >60
Glucose: 115 mg/dL — ABNORMAL HIGH (ref 65–99)
SGOT(AST): 25 U/L (ref 15–37)
Sodium: 142 mmol/L (ref 136–145)
Total Protein: 8.5 g/dL — ABNORMAL HIGH (ref 6.4–8.2)

## 2011-11-22 LAB — LIPASE, BLOOD: Lipase: 66 U/L — ABNORMAL LOW (ref 73–393)

## 2011-11-25 ENCOUNTER — Other Ambulatory Visit: Payer: Self-pay | Admitting: Internal Medicine

## 2011-11-25 MED ORDER — LISINOPRIL 10 MG PO TABS: 10.0000 mg | ORAL_TABLET | Freq: Every day | ORAL | Status: DC

## 2011-11-30 ENCOUNTER — Ambulatory Visit: Payer: Self-pay | Admitting: Surgery

## 2011-11-30 ENCOUNTER — Other Ambulatory Visit: Payer: Self-pay | Admitting: *Deleted

## 2011-11-30 MED ORDER — LISINOPRIL 10 MG PO TABS: 10.0000 mg | ORAL_TABLET | Freq: Every day | ORAL | Status: DC

## 2011-12-09 ENCOUNTER — Ambulatory Visit: Payer: Self-pay | Admitting: Gastroenterology

## 2011-12-11 LAB — PATHOLOGY REPORT

## 2011-12-17 ENCOUNTER — Encounter: Payer: Self-pay | Admitting: Internal Medicine

## 2012-01-19 ENCOUNTER — Ambulatory Visit: Payer: Self-pay | Admitting: Internal Medicine

## 2012-01-19 LAB — URINALYSIS, COMPLETE
Blood: NEGATIVE
Glucose,UR: NEGATIVE mg/dL (ref 0–75)
Ketone: NEGATIVE
Leukocyte Esterase: NEGATIVE
Protein: NEGATIVE
Specific Gravity: 1.02 (ref 1.003–1.030)

## 2012-01-21 LAB — URINE CULTURE

## 2012-01-23 ENCOUNTER — Ambulatory Visit: Payer: Self-pay | Admitting: Internal Medicine

## 2012-02-10 ENCOUNTER — Ambulatory Visit: Payer: PRIVATE HEALTH INSURANCE | Admitting: Internal Medicine

## 2012-02-11 ENCOUNTER — Ambulatory Visit: Payer: PRIVATE HEALTH INSURANCE | Admitting: Internal Medicine

## 2012-02-26 ENCOUNTER — Ambulatory Visit (INDEPENDENT_AMBULATORY_CARE_PROVIDER_SITE_OTHER): Payer: PRIVATE HEALTH INSURANCE | Admitting: *Deleted

## 2012-02-26 ENCOUNTER — Ambulatory Visit: Payer: PRIVATE HEALTH INSURANCE | Admitting: Internal Medicine

## 2012-02-26 ENCOUNTER — Encounter: Payer: Self-pay | Admitting: Internal Medicine

## 2012-02-26 VITALS — BP 122/80 | HR 79 | Temp 98.7°F | Ht 65.0 in | Wt 184.5 lb

## 2012-02-26 DIAGNOSIS — I10 Essential (primary) hypertension: Secondary | ICD-10-CM

## 2012-02-26 DIAGNOSIS — E119 Type 2 diabetes mellitus without complications: Secondary | ICD-10-CM

## 2012-02-26 DIAGNOSIS — N760 Acute vaginitis: Secondary | ICD-10-CM

## 2012-02-26 DIAGNOSIS — E663 Overweight: Secondary | ICD-10-CM

## 2012-02-26 DIAGNOSIS — R300 Dysuria: Secondary | ICD-10-CM

## 2012-02-26 LAB — COMPREHENSIVE METABOLIC PANEL
AST: 19 U/L (ref 0–37)
CO2: 27 mEq/L (ref 19–32)
Calcium: 9.1 mg/dL (ref 8.4–10.5)
Chloride: 101 mEq/L (ref 96–112)
Creat: 0.66 mg/dL (ref 0.50–1.10)
Glucose, Bld: 120 mg/dL — ABNORMAL HIGH (ref 70–99)
Potassium: 4.1 mEq/L (ref 3.5–5.3)

## 2012-02-26 LAB — POCT URINALYSIS DIPSTICK
Bilirubin, UA: NEGATIVE
Nitrite, UA: NEGATIVE
Protein, UA: NEGATIVE
Spec Grav, UA: 1.015
Urobilinogen, UA: 0.2
pH, UA: 5

## 2012-02-26 MED ORDER — CIPROFLOXACIN HCL 500 MG PO TABS: 500.0000 mg | ORAL_TABLET | Freq: Two times a day (BID) | ORAL | Status: AC

## 2012-02-26 MED ORDER — PHENTERMINE HCL 37.5 MG PO CAPS: 37.5000 mg | ORAL_CAPSULE | ORAL | Status: DC

## 2012-02-26 MED ORDER — LISINOPRIL 10 MG PO TABS: 10.0000 mg | ORAL_TABLET | Freq: Every day | ORAL | Status: DC

## 2012-02-27 LAB — HEMOGLOBIN A1C
Hgb A1c MFr Bld: 6.2 % — ABNORMAL HIGH (ref <5.7)
Mean Plasma Glucose: 131 mg/dL — ABNORMAL HIGH (ref <117)

## 2012-02-29 ENCOUNTER — Encounter: Payer: Self-pay | Admitting: Internal Medicine

## 2012-04-04 NOTE — Progress Notes (Signed)
  Subjective:    Patient ID: Alyssa Schultz, female    DOB: 09/12/1965, 46 y.o.   MRN: [id number]  HPI Nurse only   Review of Systems     Objective:   Physical Exam        Assessment & Plan:

## 2012-05-20 ENCOUNTER — Ambulatory Visit (INDEPENDENT_AMBULATORY_CARE_PROVIDER_SITE_OTHER): Payer: PRIVATE HEALTH INSURANCE | Admitting: Internal Medicine

## 2012-05-20 ENCOUNTER — Encounter: Payer: Self-pay | Admitting: Internal Medicine

## 2012-05-20 VITALS — BP 132/80 | HR 69 | Temp 97.7°F | Ht 65.0 in | Wt 189.5 lb

## 2012-05-20 DIAGNOSIS — E119 Type 2 diabetes mellitus without complications: Secondary | ICD-10-CM

## 2012-05-20 DIAGNOSIS — B372 Candidiasis of skin and nail: Secondary | ICD-10-CM

## 2012-05-20 DIAGNOSIS — H6690 Otitis media, unspecified, unspecified ear: Secondary | ICD-10-CM

## 2012-05-20 DIAGNOSIS — I10 Essential (primary) hypertension: Secondary | ICD-10-CM

## 2012-05-20 DIAGNOSIS — E663 Overweight: Secondary | ICD-10-CM

## 2012-05-20 DIAGNOSIS — R300 Dysuria: Secondary | ICD-10-CM

## 2012-05-20 DIAGNOSIS — H6691 Otitis media, unspecified, right ear: Secondary | ICD-10-CM

## 2012-05-20 LAB — POCT URINALYSIS DIPSTICK
Clarity, UA: NEGATIVE
Glucose, UA: NEGATIVE
Ketones, UA: NEGATIVE
Spec Grav, UA: 1.015
Urobilinogen, UA: 0.2
pH, UA: 5.5

## 2012-05-20 LAB — LIPID PANEL
HDL: 54.8 mg/dL (ref >39.00)
Total CHOL/HDL Ratio: 3
VLDL: 24 mg/dL (ref 0.0–40.0)

## 2012-05-20 LAB — COMPREHENSIVE METABOLIC PANEL
ALT: 15 U/L (ref 0–35)
AST: 17 U/L (ref 0–37)
Albumin: 3.8 g/dL (ref 3.5–5.2)
Alkaline Phosphatase: 58 U/L (ref 39–117)
BUN: 12 mg/dL (ref 6–23)
Calcium: 9 mg/dL (ref 8.4–10.5)
Creatinine, Ser: 0.6 mg/dL (ref 0.4–1.2)
Total Protein: 7.3 g/dL (ref 6.0–8.3)

## 2012-05-20 MED ORDER — NYSTATIN 100000 UNIT/GM EX POWD: Freq: Four times a day (QID) | CUTANEOUS | Status: DC

## 2012-05-20 MED ORDER — AMOXICILLIN-POT CLAVULANATE 875-125 MG PO TABS: 1.0000 | ORAL_TABLET | Freq: Two times a day (BID) | ORAL | Status: DC

## 2012-05-20 MED ORDER — PHENTERMINE HCL 37.5 MG PO CAPS: 37.5000 mg | ORAL_CAPSULE | ORAL | Status: DC

## 2012-05-20 MED ORDER — FLUCONAZOLE 150 MG PO TABS: 150.0000 mg | ORAL_TABLET | Freq: Every day | ORAL | Status: DC

## 2012-05-20 NOTE — Assessment & Plan Note (Signed)
Symptoms and exam are consistent with superficial candidiasis under the breasts. Will treat with topical nystatin powder and oral fluconazole. Patient will call if symptoms are persistent.

## 2012-05-20 NOTE — Assessment & Plan Note (Signed)
Blood pressure has been well-controlled with use of lisinopril. Will continue. Followup in 3 months or sooner as needed.

## 2012-05-20 NOTE — Progress Notes (Signed)
Subjective:    Patient ID: Alyssa Schultz, female    DOB: 05-11-1966, 46 y.o.   MRN: [id number]  HPI 46 year old female with history of diabetes, hypertension, obesity presents for followup. She reports she is generally feeling well. She has 2 concerns today. First, she reports a red itchy rash under her breasts and between her breasts. She has been applying topical nystatin ointment with minimal improvement. Secondly, she is concerned about increased nasal congestion over the last couple of weeks and right ear pain. She denies any fever, chills, cough, shortness of breath. She has not taken any medication for this.  In regards to diabetes, she has not been regularly checking her blood sugars but reports that she has been following a healthy diet and has started a running program, running or walking 2-1/2 miles daily. She has not recently been taking phentermine to help with appetite suppression and weight loss.  Outpatient Encounter Prescriptions as of 05/20/2012  Medication Sig Dispense Refill  . lisinopril (PRINIVIL) 10 MG tablet Take 1 tablet (10 mg total) by mouth daily.  90 tablet  2  . phentermine 37.5 MG capsule Take 1 capsule (37.5 mg total) by mouth every morning.  30 capsule  2  . DISCONTD: phentermine 37.5 MG capsule Take 1 capsule (37.5 mg total) by mouth every morning.  30 capsule  2  . amoxicillin-clavulanate (AUGMENTIN) 875-125 MG per tablet Take 1 tablet by mouth 2 (two) times daily.  20 tablet  0  . fluconazole (DIFLUCAN) 150 MG tablet Take 1 tablet (150 mg total) by mouth daily.  3 tablet  0  . nystatin (MYCOSTATIN) powder Apply topically 4 (four) times daily.  60 g  2   BP 132/80  Pulse 69  Temp 97.7 F (36.5 C) (Oral)  Ht 5\' 5"  (1.651 m)  Wt 189 lb 8 oz (85.957 kg)  BMI 31.53 kg/m2  SpO2 96%  Review of Systems  Constitutional: Negative for fever, chills, appetite change, fatigue and unexpected weight change.  HENT: Positive for ear pain and congestion. Negative for  sore throat, trouble swallowing, neck pain, voice change and sinus pressure.   Eyes: Negative for visual disturbance.  Respiratory: Negative for cough, shortness of breath, wheezing and stridor.   Cardiovascular: Negative for chest pain, palpitations and leg swelling.  Gastrointestinal: Negative for nausea, vomiting, abdominal pain, diarrhea, constipation, blood in stool, abdominal distention and anal bleeding.  Genitourinary: Negative for dysuria and flank pain.  Musculoskeletal: Negative for myalgias, arthralgias and gait problem.  Skin: Positive for color change and rash.  Neurological: Negative for dizziness and headaches.  Hematological: Negative for adenopathy. Does not bruise/bleed easily.  Psychiatric/Behavioral: Negative for suicidal ideas, disturbed wake/sleep cycle and dysphoric mood. The patient is not nervous/anxious.        Objective:   Physical Exam  Constitutional: She is oriented to person, place, and time. She appears well-developed and well-nourished. No distress.  HENT:  Head: Normocephalic and atraumatic.  Right Ear: External ear normal. Tympanic membrane is erythematous and bulging. A middle ear effusion is present.  Left Ear: External ear normal. Tympanic membrane is not bulging.  No middle ear effusion.  Nose: Nose normal.  Mouth/Throat: Oropharynx is clear and moist. No oropharyngeal exudate.  Eyes: Conjunctivae normal are normal. Pupils are equal, round, and reactive to light. Right eye exhibits no discharge. Left eye exhibits no discharge. No scleral icterus.  Neck: Normal range of motion. Neck supple. No tracheal deviation present. No thyromegaly present.  Cardiovascular: Normal rate,  regular rhythm, normal heart sounds and intact distal pulses.  Exam reveals no gallop and no friction rub.   No murmur heard. Pulmonary/Chest: Effort normal and breath sounds normal. No respiratory distress. She has no wheezes. She has no rales. She exhibits no tenderness.    Musculoskeletal: Normal range of motion. She exhibits no edema and no tenderness.  Lymphadenopathy:    She has no cervical adenopathy.  Neurological: She is alert and oriented to person, place, and time. No cranial nerve deficit. She exhibits normal muscle tone. Coordination normal.  Skin: Skin is warm and dry. Rash noted. Rash is maculopapular. She is not diaphoretic. No erythema. No pallor.     Psychiatric: She has a normal mood and affect. Her behavior is normal. Judgment and thought content normal.          Assessment & Plan:

## 2012-05-20 NOTE — Assessment & Plan Note (Signed)
Blood sugars have generally been well controlled without use of medication. Will recheck A1c with labs today. Follow up 3 months or sooner as needed.

## 2012-05-20 NOTE — Assessment & Plan Note (Signed)
BMI 31. Patient has started a running program and is limiting her caloric intake. Encouraged her to continue with this. Will continue phentermine to help with appetite suppression. Followup in 3 months or sooner if needed.

## 2012-05-20 NOTE — Assessment & Plan Note (Signed)
Symptoms and exam are consistent with right otitis media. Will treat with Augmentin. Patient will call if symptoms are not improving over the next 48 hours.

## 2012-05-26 ENCOUNTER — Ambulatory Visit: Payer: PRIVATE HEALTH INSURANCE | Admitting: Internal Medicine

## 2012-06-01 ENCOUNTER — Encounter: Payer: Self-pay | Admitting: Internal Medicine

## 2012-06-29 ENCOUNTER — Ambulatory Visit: Payer: Self-pay | Admitting: Internal Medicine

## 2012-07-07 ENCOUNTER — Encounter: Payer: Self-pay | Admitting: Internal Medicine

## 2012-08-24 ENCOUNTER — Ambulatory Visit: Payer: PRIVATE HEALTH INSURANCE | Admitting: Internal Medicine

## 2012-08-24 ENCOUNTER — Other Ambulatory Visit: Payer: Self-pay | Admitting: Internal Medicine

## 2012-08-24 MED ORDER — ACCU-CHEK MULTICLIX LANCETS MISC: Status: DC

## 2012-08-24 NOTE — Telephone Encounter (Signed)
Rx sent to pharmacy electronically.   

## 2012-08-24 NOTE — Telephone Encounter (Signed)
ACCU-Chek Multiclix Lance  102 EAC  Use to check blood sugar as directed

## 2012-08-25 ENCOUNTER — Ambulatory Visit: Payer: PRIVATE HEALTH INSURANCE | Admitting: Internal Medicine

## 2012-09-06 ENCOUNTER — Encounter: Payer: Self-pay | Admitting: Internal Medicine

## 2012-09-13 ENCOUNTER — Ambulatory Visit (INDEPENDENT_AMBULATORY_CARE_PROVIDER_SITE_OTHER): Payer: 59 | Admitting: Adult Health

## 2012-09-13 ENCOUNTER — Encounter: Payer: Self-pay | Admitting: Adult Health

## 2012-09-13 VITALS — BP 115/84 | HR 85 | Temp 98.0°F | Resp 16 | Ht 66.0 in | Wt 189.0 lb

## 2012-09-13 DIAGNOSIS — E119 Type 2 diabetes mellitus without complications: Secondary | ICD-10-CM

## 2012-09-13 DIAGNOSIS — H6691 Otitis media, unspecified, right ear: Secondary | ICD-10-CM

## 2012-09-13 DIAGNOSIS — N390 Urinary tract infection, site not specified: Secondary | ICD-10-CM

## 2012-09-13 DIAGNOSIS — H6690 Otitis media, unspecified, unspecified ear: Secondary | ICD-10-CM

## 2012-09-13 LAB — POCT URINALYSIS DIPSTICK
Ketones, UA: NEGATIVE
Nitrite, UA: NEGATIVE
Protein, UA: NEGATIVE
Urobilinogen, UA: 0.2

## 2012-09-13 MED ORDER — ANTIPYRINE-BENZOCAINE 5.4-1.4 % OT SOLN: 3.0000 [drp] | OTIC | Status: DC | PRN

## 2012-09-13 MED ORDER — AMOXICILLIN 875 MG PO TABS: 875.0000 mg | ORAL_TABLET | Freq: Two times a day (BID) | ORAL | Status: DC

## 2012-09-13 NOTE — Progress Notes (Signed)
  Subjective:    Patient ID: Alyssa Schultz, female    DOB: 08/01/1966, 47 y.o.   MRN: [id number]  HPI  Patient is a very pleasant 47 y/o female who presents to clinic with c/o ear ache. She also reports that her blood glucose finger sticks have been above her usual and she is concerned about this. She is still eating healthy and exercising. Pt has also been feeling a little lightheaded. Denies fever, chills. Patient also wonders if she might have a uti 2/2 mild dysuria.   Current Outpatient Prescriptions on File Prior to Visit  Medication Sig Dispense Refill  . Lancets (ACCU-CHEK MULTICLIX) lancets Use as instructed to check blood sugar  100 each  4  . lisinopril (PRINIVIL) 10 MG tablet Take 1 tablet (10 mg total) by mouth daily.  90 tablet  2  . nystatin (MYCOSTATIN) powder Apply topically 4 (four) times daily.  60 g  2  . amoxicillin-clavulanate (AUGMENTIN) 875-125 MG per tablet Take 1 tablet by mouth 2 (two) times daily.  20 tablet  0  . fluconazole (DIFLUCAN) 150 MG tablet Take 1 tablet (150 mg total) by mouth daily.  3 tablet  0  . phentermine 37.5 MG capsule Take 1 capsule (37.5 mg total) by mouth every morning.  30 capsule  2     Review of Systems  Constitutional: Negative for fever and chills.  HENT: Positive for ear pain. Negative for congestion, sore throat, tinnitus and ear discharge.   Respiratory: Negative.   Cardiovascular: Negative.   Gastrointestinal: Negative.   Genitourinary: Positive for dysuria. Negative for urgency and pelvic pain.  Neurological: Positive for light-headedness.  Psychiatric/Behavioral: Negative.     BP 115/84  Pulse 85  Temp 98 F (36.7 C) (Oral)  Resp 16  Ht 5\' 6"  (1.676 m)  Wt 189 lb (85.73 kg)  BMI 30.51 kg/m2  SpO2 97%     Objective:   Physical Exam  Constitutional: She is oriented to person, place, and time. She appears well-developed and well-nourished. No distress.  HENT:  Head: Normocephalic and atraumatic.  Left Ear: External  ear normal.       Right ear canal erythematous. TM bulging and erythema.  Eyes: Conjunctivae normal are normal. Pupils are equal, round, and reactive to light.  Cardiovascular: Normal rate, regular rhythm and normal heart sounds.  Exam reveals no gallop and no friction rub.   No murmur heard. Pulmonary/Chest: Effort normal and breath sounds normal.  Abdominal: Soft. Bowel sounds are normal.  Neurological: She is alert and oriented to person, place, and time. No cranial nerve deficit. Coordination normal.  Skin: Skin is warm and dry.  Psychiatric: She has a normal mood and affect. Her behavior is normal. Judgment and thought content normal.          Assessment & Plan:

## 2012-09-13 NOTE — Assessment & Plan Note (Signed)
This has been well controlled with her diet. Recent elevation in blood glucose - suspect this is related to illness. Discussed about waiting to have resolution of ear infection and we would f/u if blood glucose still elevated. I suspect that it will return to her baseline.

## 2012-09-13 NOTE — Assessment & Plan Note (Addendum)
Start amoxicillin. Also ordered Auralgan drops for pain. RTC if no resolution of symptoms.

## 2012-09-13 NOTE — Patient Instructions (Signed)
  You have a right ear infection.  Start Amoxicillin today. You will take this medication twice a day for 10 days.  Your blood glucose may increase with infection. After resolution of ear infection we will re-assess if blood glucose still remaining elevated.  I suspect your blood glucose will return to baseline.  Call if you have any questions.  RTC if no improvement within 3-4 days.

## 2012-09-15 ENCOUNTER — Ambulatory Visit: Payer: PRIVATE HEALTH INSURANCE | Admitting: Internal Medicine

## 2012-09-22 ENCOUNTER — Other Ambulatory Visit: Payer: Self-pay | Admitting: Adult Health

## 2012-09-22 ENCOUNTER — Telehealth: Payer: Self-pay | Admitting: Emergency Medicine

## 2012-09-22 DIAGNOSIS — B372 Candidiasis of skin and nail: Secondary | ICD-10-CM

## 2012-09-22 MED ORDER — FLUCONAZOLE 150 MG PO TABS: 150.0000 mg | ORAL_TABLET | Freq: Once | ORAL | Status: DC

## 2012-09-22 NOTE — Telephone Encounter (Signed)
Patient said she is taking some antibiotics for her ear infection and she now has a yeast infection. Can we call her something in for this please?

## 2012-09-22 NOTE — Telephone Encounter (Signed)
Alyssa Schultz,  Please let patient know I sent in prescription for yeast infection to the [facility].  Thanks.

## 2012-09-26 ENCOUNTER — Ambulatory Visit (INDEPENDENT_AMBULATORY_CARE_PROVIDER_SITE_OTHER): Payer: 59 | Admitting: Adult Health

## 2012-09-26 ENCOUNTER — Other Ambulatory Visit: Payer: Self-pay | Admitting: *Deleted

## 2012-09-26 ENCOUNTER — Encounter: Payer: Self-pay | Admitting: Adult Health

## 2012-09-26 VITALS — BP 134/88 | HR 82 | Temp 98.1°F | Resp 14 | Wt 194.0 lb

## 2012-09-26 DIAGNOSIS — N390 Urinary tract infection, site not specified: Secondary | ICD-10-CM

## 2012-09-26 DIAGNOSIS — B001 Herpesviral vesicular dermatitis: Secondary | ICD-10-CM | POA: Insufficient documentation

## 2012-09-26 DIAGNOSIS — B009 Herpesviral infection, unspecified: Secondary | ICD-10-CM

## 2012-09-26 DIAGNOSIS — Z113 Encounter for screening for infections with a predominantly sexual mode of transmission: Secondary | ICD-10-CM

## 2012-09-26 DIAGNOSIS — R300 Dysuria: Secondary | ICD-10-CM | POA: Insufficient documentation

## 2012-09-26 LAB — POCT URINALYSIS DIPSTICK
Bilirubin, UA: NEGATIVE
Blood, UA: NEGATIVE
Glucose, UA: NEGATIVE
Nitrite, UA: NEGATIVE

## 2012-09-26 MED ORDER — VALACYCLOVIR HCL 1 G PO TABS: ORAL_TABLET | ORAL | Status: DC

## 2012-09-26 MED ORDER — LIDOCAINE 5 % EX OINT: TOPICAL_OINTMENT | CUTANEOUS | Status: DC

## 2012-09-26 NOTE — Patient Instructions (Addendum)
  Apply lidocaine to your lip for pain. You can use a small amount every 3-4 hours.  Take the Valtrex as we discussed.  Please have labs drawn prior to leaving the office.  I will notify you of results once they are available.

## 2012-09-26 NOTE — Assessment & Plan Note (Addendum)
Presents with symptoms ongoing for 2 days. Urine dip negative for UTI. Will send for culture. Patient is also concerned that her fever blister may be associated with this dysuria. Concerns if this is herpes. There are no lesions that she has identified. Will order some blood work for herpes simplex I & II. Also sending urine for gonorrhea and chlamydia.

## 2012-09-26 NOTE — Assessment & Plan Note (Signed)
Symptom of swollen, painful Left lower lip on Sunday. She is concerned because she has never had this before. Try to allay anxiety that this is a very common occurrence. Will give short course of Valtrex and also some lidocaine for pain.

## 2012-09-26 NOTE — Progress Notes (Signed)
  Subjective:    Patient ID: Alyssa Schultz, female    DOB: Jan 07, 1966, 47 y.o.   MRN: [id number]  HPI  Patient is a very pleasant 47 y/o female who presents to clinic today with painful fever blister on the left side of lower lip. She first noticed this on Sunday morning. She denies taking any new medications or eating any foods that may have triggered any inflammatory reaction. Patient also reports she has dysuria.   Current Outpatient Prescriptions on File Prior to Visit  Medication Sig Dispense Refill  . antipyrine-benzocaine (AURALGAN) otic solution Place 3 drops into the right ear every 2 (two) hours as needed for pain.  10 mL  0  . Lancets (ACCU-CHEK MULTICLIX) lancets Use as instructed to check blood sugar  100 each  4  . lisinopril (PRINIVIL) 10 MG tablet Take 1 tablet (10 mg total) by mouth daily.  90 tablet  2  . nystatin (MYCOSTATIN) powder Apply topically 4 (four) times daily.  60 g  2  . phentermine 37.5 MG capsule Take 1 capsule (37.5 mg total) by mouth every morning.  30 capsule  2   No current facility-administered medications on file prior to visit.     Review of Systems  Constitutional: Negative for fever and chills.  HENT: Negative.   Respiratory: Negative for cough and shortness of breath.   Cardiovascular: Negative for chest pain.  Gastrointestinal: Negative.   Genitourinary: Positive for dysuria.  Neurological: Negative.   Psychiatric/Behavioral: Negative.     BP 134/88  Pulse 82  Temp(Src) 98.1 F (36.7 C) (Oral)  Resp 14  Wt 194 lb (87.998 kg)  BMI 31.33 kg/m2  SpO2 97%     Objective:   Physical Exam  Constitutional: She is oriented to person, place, and time. She appears well-developed and well-nourished. No distress.  Neurological: She is alert and oriented to person, place, and time.  Skin:  Swollen, erythema L lower lip  Psychiatric: She has a normal mood and affect. Her behavior is normal. Judgment and thought content normal.           Assessment & Plan:

## 2012-09-26 NOTE — Telephone Encounter (Signed)
Received faxed refill request for test strips, test strip not on medication list added Please advise directions

## 2012-09-26 NOTE — Telephone Encounter (Signed)
Pt should check blood sugar 1-2 times daily. Disp #100 test strips refill 11

## 2012-09-27 LAB — URINE CULTURE

## 2012-10-02 ENCOUNTER — Emergency Department: Payer: Self-pay | Admitting: Emergency Medicine

## 2012-10-03 ENCOUNTER — Other Ambulatory Visit: Payer: Self-pay | Admitting: Internal Medicine

## 2012-10-03 MED ORDER — GLUCOSE BLOOD VI STRP: ORAL_STRIP | Status: DC

## 2012-10-03 NOTE — Telephone Encounter (Signed)
Pt has not received the rx back for her Strips. Her pharmacy says they have faxed those over last week ??

## 2012-10-03 NOTE — Telephone Encounter (Signed)
Rx has been escribed to [facility] pharmacy

## 2012-10-05 LAB — CHLAMYDIA/GONOCOCCUS/TRICHOMONAS, NAA
Chlamydia by NAA: NEGATIVE
Gonococcus by NAA: NEGATIVE
Trich vag by NAA: NEGATIVE

## 2012-10-06 ENCOUNTER — Telehealth: Payer: Self-pay | Admitting: *Deleted

## 2012-10-06 NOTE — Telephone Encounter (Signed)
Called patient and no answer;msg left

## 2012-10-06 NOTE — Telephone Encounter (Signed)
Message copied by Algis Downs on Thu Oct 06, 2012 10:24 AM ------      Message from: Novella Olive      Created: Wed Oct 05, 2012 11:59 AM       Asher Muir,      Please call Braylon and let her know that her results of Gonorrhea/Chlamydia and Trich were negative.      Thanks,      Raquel ------

## 2012-10-06 NOTE — Telephone Encounter (Signed)
Patient would like a script to take her A1C1 at her employer. Fax number [phone]

## 2012-10-07 NOTE — Telephone Encounter (Signed)
Faxed

## 2012-10-12 ENCOUNTER — Ambulatory Visit: Payer: Self-pay | Admitting: Internal Medicine

## 2012-10-12 LAB — HEMOGLOBIN A1C: Hemoglobin A1C: 7.1 % — ABNORMAL HIGH (ref 4.2–6.3)

## 2012-10-14 ENCOUNTER — Ambulatory Visit (INDEPENDENT_AMBULATORY_CARE_PROVIDER_SITE_OTHER): Payer: 59 | Admitting: Internal Medicine

## 2012-10-14 ENCOUNTER — Encounter: Payer: Self-pay | Admitting: Internal Medicine

## 2012-10-14 VITALS — BP 140/90 | HR 92 | Temp 97.9°F | Wt 196.0 lb

## 2012-10-14 DIAGNOSIS — E119 Type 2 diabetes mellitus without complications: Secondary | ICD-10-CM

## 2012-10-14 DIAGNOSIS — J309 Allergic rhinitis, unspecified: Secondary | ICD-10-CM | POA: Insufficient documentation

## 2012-10-14 DIAGNOSIS — J0100 Acute maxillary sinusitis, unspecified: Secondary | ICD-10-CM

## 2012-10-14 MED ORDER — AZELASTINE-FLUTICASONE 137-50 MCG/ACT NA SUSP: 1.0000 | Freq: Every day | NASAL | Status: DC

## 2012-10-14 MED ORDER — LEVOFLOXACIN 750 MG PO TABS: 750.0000 mg | ORAL_TABLET | Freq: Every day | ORAL | Status: AC

## 2012-10-14 NOTE — Assessment & Plan Note (Signed)
Will start Dymista and Claritin to help with allergy symptoms which are likely exacerbating and contributing to current infection. Follow up 2 weeks.

## 2012-10-14 NOTE — Assessment & Plan Note (Signed)
BG elevated this month in setting of recent infection. A1c 7.1%. For now, will continue to monitor and make effort at healthy diet and exercise. If no improvement in BG after treatment for sinusitis complete, then consider restarting metformin.

## 2012-10-14 NOTE — Assessment & Plan Note (Signed)
Severe persistent symptoms of acute maxillary sinusitis x 1 month despite treatment with Augmentin. Question if she may have resistant infection such as Pseudomonas, or if her previous facial injury may have led to scar tissue resulting in obstruction. Will start Levaquin and Dymista. Will set up referral to ENT for further evaluation. Question if CT and/or direct visualization may be helpful.

## 2012-10-14 NOTE — Progress Notes (Signed)
Subjective:    Patient ID: Alyssa Schultz, female    DOB: [birth year], 47 y.o.   MRN: [id number]  HPI 47YO female with h/o DM presents for acute visit complaining of 1 month h/o nasal congestion, sinus pressure, facial pain, bilateral ear pain, non-productive cough, general malaise. No recent fever or chills. Was seen in the office 1 month ago and treated with augmentin for otitis media with no improvement.  Denies chest pain or dyspnea. Has been using OTC cough and cold medications with no improvement.  Notes that BG have been elevated over last month during period of illness. Recent A1c was 7.1%.  Outpatient Encounter Prescriptions as of 10/14/2012  Medication Sig Dispense Refill  . glucose blood (ACCU-CHEK AVIVA PLUS) test strip Use as instructed  100 each  1  . Lancets (ACCU-CHEK MULTICLIX) lancets Use as instructed to check blood sugar  100 each  4  . lisinopril (PRINIVIL) 10 MG tablet Take 1 tablet (10 mg total) by mouth daily.  90 tablet  2  . nystatin (MYCOSTATIN) powder Apply topically 4 (four) times daily.  60 g  2  . valACYclovir (VALTREX) 1000 MG tablet Take 2 tablets in the morning and 2 tablets in the evening x 1 day. Then take 1/2 tablet daily for 2 weeks.  30 tablet  0  . [DISCONTINUED] phentermine 37.5 MG capsule Take 1 capsule (37.5 mg total) by mouth every morning.  30 capsule  2  . antipyrine-benzocaine (AURALGAN) otic solution Place 3 drops into the right ear every 2 (two) hours as needed for pain.  10 mL  0  . Azelastine-Fluticasone 137-50 MCG/ACT SUSP Place 1 spray into the nose daily.  23 g  0  . levofloxacin (LEVAQUIN) 750 MG tablet Take 1 tablet (750 mg total) by mouth daily.  7 tablet  0  . lidocaine (XYLOCAINE) 5 % ointment Apply topically to lip every 3-4 hours as needed for pain.  35.44 g  0   No facility-administered encounter medications on file as of 10/14/2012.   BP 140/90  Pulse 92  Temp(Src) 97.9 F (36.6 C) (Oral)  Wt 196 lb (88.905 kg)  BMI 31.65 kg/m2   SpO2 97%  Review of Systems  Constitutional: Positive for fatigue. Negative for fever, chills, appetite change and unexpected weight change.  HENT: Positive for ear pain, congestion, sore throat, rhinorrhea, postnasal drip and sinus pressure. Negative for trouble swallowing, neck pain and voice change.   Eyes: Positive for itching. Negative for visual disturbance.  Respiratory: Positive for cough. Negative for shortness of breath, wheezing and stridor.   Cardiovascular: Negative for chest pain, palpitations and leg swelling.  Gastrointestinal: Negative for nausea, vomiting, abdominal pain, diarrhea, constipation, blood in stool, abdominal distention and anal bleeding.  Genitourinary: Negative for dysuria and flank pain.  Musculoskeletal: Negative for myalgias, arthralgias and gait problem.  Skin: Negative for color change and rash.  Neurological: Negative for dizziness and headaches.  Hematological: Negative for adenopathy. Does not bruise/bleed easily.  Psychiatric/Behavioral: Negative for suicidal ideas, sleep disturbance and dysphoric mood. The patient is not nervous/anxious.        Objective:   Physical Exam  Constitutional: She is oriented to person, place, and time. She appears well-developed and well-nourished. No distress.  HENT:  Head: Normocephalic and atraumatic.  Right Ear: External ear normal. Tympanic membrane is erythematous and bulging. A middle ear effusion is present.  Left Ear: External ear normal. Tympanic membrane is erythematous and bulging. A middle ear effusion is present.  Nose: Mucosal edema and sinus tenderness present. Right sinus exhibits maxillary sinus tenderness. Left sinus exhibits maxillary sinus tenderness.  Mouth/Throat: Oropharynx is clear and moist. No oropharyngeal exudate.  Eyes: Conjunctivae are normal. Pupils are equal, round, and reactive to light. Right eye exhibits no discharge. Left eye exhibits no discharge. No scleral icterus.  Neck: Normal  range of motion. Neck supple. No tracheal deviation present. No thyromegaly present.  Cardiovascular: Normal rate, regular rhythm, normal heart sounds and intact distal pulses.  Exam reveals no gallop and no friction rub.   No murmur heard. Pulmonary/Chest: Effort normal and breath sounds normal. No accessory muscle usage. Not tachypneic. No respiratory distress. She has no decreased breath sounds. She has no wheezes. She has no rhonchi. She has no rales. She exhibits no tenderness.  Musculoskeletal: Normal range of motion. She exhibits no edema and no tenderness.  Lymphadenopathy:    She has no cervical adenopathy.  Neurological: She is alert and oriented to person, place, and time. No cranial nerve deficit. She exhibits normal muscle tone. Coordination normal.  Skin: Skin is warm and dry. No rash noted. She is not diaphoretic. No erythema. No pallor.  Psychiatric: She has a normal mood and affect. Her behavior is normal. Judgment and thought content normal.          Assessment & Plan:

## 2012-10-28 ENCOUNTER — Encounter: Payer: Self-pay | Admitting: Internal Medicine

## 2012-11-29 ENCOUNTER — Other Ambulatory Visit: Payer: Self-pay | Admitting: *Deleted

## 2012-11-29 DIAGNOSIS — I10 Essential (primary) hypertension: Secondary | ICD-10-CM

## 2012-11-29 MED ORDER — LISINOPRIL 10 MG PO TABS: 10.0000 mg | ORAL_TABLET | Freq: Every day | ORAL | Status: DC

## 2012-11-29 NOTE — Telephone Encounter (Signed)
Refill Request  Phentermine 37.5 MG tablet  #30 tab  Take one tablet by mouth every morning

## 2012-12-01 ENCOUNTER — Telehealth: Payer: Self-pay | Admitting: Internal Medicine

## 2012-12-01 NOTE — Telephone Encounter (Signed)
Patient calling for refill on her Phentermine

## 2012-12-01 NOTE — Telephone Encounter (Signed)
Pt called [facility] does not have her rx she just checked  Please resend.  Her lisinopril and phenermine  Please call pt when this is called in

## 2012-12-01 NOTE — Telephone Encounter (Signed)
Fine to refill phentermine x 1 month and lisinopril x 6 months.

## 2012-12-01 NOTE — Telephone Encounter (Signed)
Prescriptions has been called to [facility]

## 2013-01-12 ENCOUNTER — Telehealth: Payer: Self-pay | Admitting: Internal Medicine

## 2013-01-12 DIAGNOSIS — E663 Overweight: Secondary | ICD-10-CM

## 2013-01-12 MED ORDER — PHENTERMINE HCL 37.5 MG PO CAPS: 37.5000 mg | ORAL_CAPSULE | ORAL | Status: DC

## 2013-01-12 NOTE — Telephone Encounter (Signed)
We can call in 1 month refill

## 2013-01-12 NOTE — Telephone Encounter (Signed)
Patient informed, prescription printed signed and left up front for patient pick up

## 2013-01-12 NOTE — Telephone Encounter (Signed)
Patient informed and first available appointment is 6/20. Would like to know if you would give her some pills until then?

## 2013-01-12 NOTE — Telephone Encounter (Signed)
Phentermine was filled last month. She will need monthly follow up while on Phentermine.

## 2013-01-12 NOTE — Telephone Encounter (Signed)
Pt calling to check on her phentermine 37.5    [facility] Please advise

## 2013-02-03 ENCOUNTER — Ambulatory Visit (INDEPENDENT_AMBULATORY_CARE_PROVIDER_SITE_OTHER): Payer: 59 | Admitting: Internal Medicine

## 2013-02-03 ENCOUNTER — Encounter: Payer: Self-pay | Admitting: Internal Medicine

## 2013-02-03 ENCOUNTER — Other Ambulatory Visit (HOSPITAL_COMMUNITY)
Admission: RE | Admit: 2013-02-03 | Discharge: 2013-02-03 | Disposition: A | Discharge status: Home / Self Care | Payer: 59 | Source: Ambulatory Visit | Attending: Internal Medicine | Admitting: Internal Medicine

## 2013-02-03 VITALS — BP 148/100 | HR 80 | Temp 98.3°F | Wt 192.0 lb

## 2013-02-03 DIAGNOSIS — Z113 Encounter for screening for infections with a predominantly sexual mode of transmission: Secondary | ICD-10-CM | POA: Insufficient documentation

## 2013-02-03 DIAGNOSIS — R8290 Unspecified abnormal findings in urine: Secondary | ICD-10-CM

## 2013-02-03 DIAGNOSIS — N760 Acute vaginitis: Secondary | ICD-10-CM | POA: Insufficient documentation

## 2013-02-03 DIAGNOSIS — B372 Candidiasis of skin and nail: Secondary | ICD-10-CM

## 2013-02-03 DIAGNOSIS — R82998 Other abnormal findings in urine: Secondary | ICD-10-CM

## 2013-02-03 DIAGNOSIS — I10 Essential (primary) hypertension: Secondary | ICD-10-CM

## 2013-02-03 DIAGNOSIS — E119 Type 2 diabetes mellitus without complications: Secondary | ICD-10-CM

## 2013-02-03 DIAGNOSIS — E663 Overweight: Secondary | ICD-10-CM

## 2013-02-03 LAB — POCT URINALYSIS DIPSTICK
Blood, UA: NEGATIVE
Glucose, UA: NEGATIVE
Nitrite, UA: NEGATIVE
Protein, UA: NEGATIVE
Spec Grav, UA: 1.015
Urobilinogen, UA: 0.2
pH, UA: 5.5

## 2013-02-03 LAB — COMPREHENSIVE METABOLIC PANEL
Alkaline Phosphatase: 62 U/L (ref 39–117)
BUN: 10 mg/dL (ref 6–23)
CO2: 25 mEq/L (ref 19–32)
Creatinine, Ser: 0.7 mg/dL (ref 0.4–1.2)
GFR: 120.52 mL/min (ref >60.00)
Glucose, Bld: 219 mg/dL — ABNORMAL HIGH (ref 70–99)
Total Bilirubin: 0.5 mg/dL (ref 0.3–1.2)

## 2013-02-03 MED ORDER — PHENTERMINE HCL 37.5 MG PO CAPS: 37.5000 mg | ORAL_CAPSULE | ORAL | Status: DC

## 2013-02-03 NOTE — Addendum Note (Signed)
Addended by: Montine Circle D on: 02/03/2013 01:34 PM   Modules accepted: Orders

## 2013-02-03 NOTE — Assessment & Plan Note (Signed)
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 Will continue to monitor. Continue Lisinopril 10mg  daily. If BP consistently  >140/90, will increase Lisinopril to 20mg  daily.

## 2013-02-03 NOTE — Assessment & Plan Note (Signed)
Wt Readings from Last 3 Encounters:  02/03/13 192 lb (87.091 kg)  10/14/12 196 lb (88.905 kg)  09/26/12 194 lb (87.998 kg)   Body mass index is 31 kg/(m^2). Congratulated pt on recent weight loss. Will continue phentermine to help with appetite suppression. Follow up 3 months and prn.

## 2013-02-03 NOTE — Progress Notes (Signed)
Subjective:    Patient ID: Alyssa Schultz, female    DOB: October 29, 1965, 47 y.o.   MRN: [id number]  HPI 47YO female with h/o DM, HTN presents for follow up. Doing well. Following healthy diet. Most BG <120. No BG >150.   Notes recent increase in yellow vaginal discharge and foul odor. No pelvic pain, fever, chills, urinary urgency, frequency. No new sexual partners. Reports symptoms are consistent with previous episodes of BV.  Outpatient Encounter Prescriptions as of 02/03/2013  Medication Sig Dispense Refill  . glucose blood (ACCU-CHEK AVIVA PLUS) test strip Use as instructed  100 each  1  . Lancets (ACCU-CHEK MULTICLIX) lancets Use as instructed to check blood sugar  100 each  4  . lisinopril (PRINIVIL) 10 MG tablet Take 1 tablet (10 mg total) by mouth daily.  90 tablet  0  . nystatin (MYCOSTATIN) powder Apply topically 4 (four) times daily.  60 g  2  . phentermine 37.5 MG capsule Take 1 capsule (37.5 mg total) by mouth every morning.  30 capsule  0   No facility-administered encounter medications on file as of 02/03/2013.   BP 148/100  Pulse 80  Temp(Src) 98.3 F (36.8 C) (Oral)  Wt 192 lb (87.091 kg)  BMI 31 kg/m2  SpO2 97%  Review of Systems  Constitutional: Negative for fever, chills, appetite change, fatigue and unexpected weight change.  HENT: Negative for ear pain, congestion, sore throat, trouble swallowing, neck pain, voice change and sinus pressure.   Eyes: Negative for visual disturbance.  Respiratory: Negative for cough, shortness of breath, wheezing and stridor.   Cardiovascular: Negative for chest pain, palpitations and leg swelling.  Gastrointestinal: Negative for nausea, vomiting, abdominal pain, diarrhea, constipation, blood in stool, abdominal distention and anal bleeding.  Genitourinary: Positive for vaginal discharge. Negative for dysuria, urgency, frequency, hematuria, flank pain, vaginal bleeding, genital sores, vaginal pain, menstrual problem and pelvic pain.   Musculoskeletal: Negative for myalgias, arthralgias and gait problem.  Skin: Negative for color change and rash.  Neurological: Negative for dizziness and headaches.  Hematological: Negative for adenopathy. Does not bruise/bleed easily.  Psychiatric/Behavioral: Negative for suicidal ideas, sleep disturbance and dysphoric mood. The patient is not nervous/anxious.        Objective:   Physical Exam  Constitutional: She is oriented to person, place, and time. She appears well-developed and well-nourished. No distress.  HENT:  Head: Normocephalic and atraumatic.  Right Ear: External ear normal.  Left Ear: External ear normal.  Nose: Nose normal.  Mouth/Throat: Oropharynx is clear and moist. No oropharyngeal exudate.  Eyes: Conjunctivae are normal. Pupils are equal, round, and reactive to light. Right eye exhibits no discharge. Left eye exhibits no discharge. No scleral icterus.  Neck: Normal range of motion. Neck supple. No tracheal deviation present. No thyromegaly present.  Cardiovascular: Normal rate, regular rhythm, normal heart sounds and intact distal pulses.  Exam reveals no gallop and no friction rub.   No murmur heard. Pulmonary/Chest: Effort normal and breath sounds normal. No accessory muscle usage. Not tachypneic. No respiratory distress. She has no decreased breath sounds. She has no wheezes. She has no rhonchi. She has no rales. She exhibits no tenderness.  Abdominal: Soft. Bowel sounds are normal. She exhibits no distension. There is no tenderness.  Musculoskeletal: Normal range of motion. She exhibits no edema and no tenderness.  Lymphadenopathy:    She has no cervical adenopathy.  Neurological: She is alert and oriented to person, place, and time. No cranial nerve deficit. She  exhibits normal muscle tone. Coordination normal.  Skin: Skin is warm and dry. No rash noted. She is not diaphoretic. No erythema. No pallor.  Psychiatric: She has a normal mood and affect. Her  behavior is normal. Judgment and thought content normal.          Assessment & Plan:

## 2013-02-03 NOTE — Assessment & Plan Note (Signed)
Previously diet controlled. Will check A1c with labs today. Encouraged compliance with healthy diet and regular physical activity.

## 2013-02-03 NOTE — Assessment & Plan Note (Signed)
Pt notes some increase in discharge consistent with previous episodes of BV. No pelvic pain, fever, chills. Urinalysis normal. Will send vaginal swab for testing for GC, CHL, BV, and yeast.

## 2013-02-05 LAB — URINE CULTURE: Colony Count: 6000

## 2013-02-06 ENCOUNTER — Telehealth: Payer: Self-pay | Admitting: *Deleted

## 2013-02-06 MED ORDER — METFORMIN HCL 500 MG PO TABS: 500.0000 mg | ORAL_TABLET | Freq: Two times a day (BID) | ORAL | Status: DC

## 2013-02-06 NOTE — Telephone Encounter (Signed)
Message copied by Theola Sequin on Mon Feb 06, 2013  1:35 PM ------      Message from: Ronna Polio A      Created: Fri Feb 03, 2013  2:54 PM       Labs show that your blood sugars are up a little. I would recommend that we restart Metformin 500mg  po bid. We can call this in for her. Follow up in 3 months. ------

## 2013-02-06 NOTE — Telephone Encounter (Signed)
Rx faxed to pharmacy  

## 2013-02-07 ENCOUNTER — Encounter: Payer: Self-pay | Admitting: Internal Medicine

## 2013-02-07 ENCOUNTER — Other Ambulatory Visit: Payer: Self-pay | Admitting: *Deleted

## 2013-02-07 MED ORDER — METRONIDAZOLE 500 MG PO TABS: 500.0000 mg | ORAL_TABLET | Freq: Two times a day (BID) | ORAL | Status: DC

## 2013-02-07 MED ORDER — ACCU-CHEK SOFT TOUCH LANCETS MISC: Status: DC

## 2013-02-09 ENCOUNTER — Telehealth: Payer: Self-pay | Admitting: Internal Medicine

## 2013-02-09 ENCOUNTER — Encounter: Payer: Self-pay | Admitting: Internal Medicine

## 2013-02-09 NOTE — Telephone Encounter (Signed)
Patient called stating that she is not at work so we can not reply back to any [employer]. Please call her directly with any information. Patient is dizzy and nauseated, this is day 3 on metformin and has been able to work a full day. Please advise

## 2013-02-09 NOTE — Telephone Encounter (Signed)
Please have her STOP Metformin and then set up follow up visit. If symptoms are not improving, she should be seen.

## 2013-02-09 NOTE — Telephone Encounter (Signed)
Patient informed and she would like to give it a try a little longer. She will just take it at night to see how that goes.

## 2013-02-13 ENCOUNTER — Encounter: Payer: Self-pay | Admitting: Internal Medicine

## 2013-02-13 MED ORDER — MICROLET LANCETS MISC: 1.0000 | Freq: Two times a day (BID) | Status: DC

## 2013-02-23 ENCOUNTER — Encounter: Payer: Self-pay | Admitting: Internal Medicine

## 2013-02-23 MED ORDER — GLUCOSE BLOOD VI STRP: ORAL_STRIP | Status: DC

## 2013-03-02 ENCOUNTER — Encounter: Payer: Self-pay | Admitting: Adult Health

## 2013-03-02 ENCOUNTER — Ambulatory Visit (INDEPENDENT_AMBULATORY_CARE_PROVIDER_SITE_OTHER): Payer: 59 | Admitting: Adult Health

## 2013-03-02 VITALS — BP 118/84 | HR 93 | Temp 97.9°F | Resp 12 | Wt 184.0 lb

## 2013-03-02 DIAGNOSIS — E119 Type 2 diabetes mellitus without complications: Secondary | ICD-10-CM

## 2013-03-02 DIAGNOSIS — R300 Dysuria: Secondary | ICD-10-CM

## 2013-03-02 LAB — POCT URINALYSIS DIPSTICK
Blood, UA: NEGATIVE
Glucose, UA: NEGATIVE
Nitrite, UA: NEGATIVE
Urobilinogen, UA: 0.2
pH, UA: 7

## 2013-03-02 NOTE — Assessment & Plan Note (Signed)
Blood sugars are well controlled on metformin. Patient is exercising regularly. She is eating a diabetic appropriate diet. No hypoglycemic events. She inquired about having a hemoglobin A1c; however, educated about having this test done approximately every 3 months (too soon). She will return in one month for followup.

## 2013-03-02 NOTE — Progress Notes (Signed)
Subjective:    Patient ID: Alyssa Schultz, female    DOB: Nov 24, 1965, 47 y.o.   MRN: [id number]  HPI  Alyssa Schultz is a very pleasant 47 year old female who presents to clinic for followup of diabetes. She was diagnosed in the middle of June and started on metformin. Alyssa Schultz describes that this was a life altering moment for her. She had been prediabetic for a very long time thinking that it would never comfort to actual diabetes. She has been extremely proactive in the management of her diabetes. She is eating healthy. She is participating in rigorous activity. Alyssa Schultz has joined a [employer]" and attends  3 times a week. She is taking her medication without fail. She brings with her a diary of her blood glucose readings beginning June 24th showing great improvement in controls. Morning readings are in the mid 90s - low 100s. Her evening readings are 80s-90s. She has such a good attitude and desire to improve her health.  Alyssa Schultz was also wanting follow up on recent UTI. She was recently treated for one and wanted to make sure it had cleared. Apparently she has had some recently which have recurred and she is just a bit concerned about this. She also reports that they informed her she had some blood in her urine.   Current Outpatient Prescriptions on File Prior to Visit  Medication Sig Dispense Refill  . glucose blood (ACCU-CHEK AVIVA PLUS) test strip Use as instructed  100 each  1  . glucose blood test strip Patient is testing at home 1-2 times daily.  100 each  12  . Lancets (ACCU-CHEK MULTICLIX) lancets Use as instructed to check blood sugar  100 each  4  . Lancets (ACCU-CHEK SOFT TOUCH) lancets Use as instructed to check blood sugar  100 each  12  . lisinopril (PRINIVIL) 10 MG tablet Take 1 tablet (10 mg total) by mouth daily.  90 tablet  0  . MICROLET LANCETS MISC 1 each by Does not apply route 2 (two) times daily.  100 each  6  . nystatin (MYCOSTATIN) powder Apply topically 4 (four) times daily.  60 g  2    No current facility-administered medications on file prior to visit.    Review of Systems  Constitutional: Negative.   Eyes: Negative.   Respiratory: Negative.   Cardiovascular: Negative.   Gastrointestinal: Negative.   Endocrine: Negative.   Genitourinary: Positive for dysuria.       Recently treated for UTI. Had hematuria at the time and would like this rechecked.  Neurological: Negative.   Psychiatric/Behavioral: Negative.    BP 118/84  Pulse 93  Temp(Src) 97.9 F (36.6 C) (Oral)  Resp 12  Wt 184 lb (83.462 kg)  BMI 29.71 kg/m2  SpO2 96%    Objective:   Physical Exam  Constitutional: She is oriented to person, place, and time.  Overweight, very pleasant female in NAD.  HENT:  Head: Normocephalic and atraumatic.  Cardiovascular: Normal rate, regular rhythm, normal heart sounds and intact distal pulses.  Exam reveals no gallop.   No murmur heard. Pulmonary/Chest: Effort normal and breath sounds normal. No respiratory distress. She has no wheezes. She has no rales.  Abdominal: Soft. Bowel sounds are normal.  Musculoskeletal: Normal range of motion. She exhibits no edema and no tenderness.  Neurological: She is alert and oriented to person, place, and time. She has normal reflexes.  Skin: Skin is warm and dry.  Psychiatric: She has a normal mood and affect. Her  behavior is normal. Judgment and thought content normal.       Assessment & Plan:

## 2013-03-02 NOTE — Assessment & Plan Note (Signed)
Recheck urine dipstick without any evidence of blood, nitrites or leukocytes.

## 2013-03-22 ENCOUNTER — Other Ambulatory Visit: Payer: Self-pay | Admitting: *Deleted

## 2013-03-22 MED ORDER — GLUCOSE BLOOD VI STRP: ORAL_STRIP | Status: DC

## 2013-03-22 NOTE — Telephone Encounter (Signed)
Patient sent personal email stating she will begin taking the Metformin 500 BID and requested refill on test strips. Rx sent to pharmacy per patient request.

## 2013-03-31 ENCOUNTER — Ambulatory Visit (INDEPENDENT_AMBULATORY_CARE_PROVIDER_SITE_OTHER): Payer: 59 | Admitting: Adult Health

## 2013-03-31 ENCOUNTER — Encounter: Payer: Self-pay | Admitting: Adult Health

## 2013-03-31 ENCOUNTER — Telehealth: Payer: Self-pay | Admitting: *Deleted

## 2013-03-31 VITALS — BP 112/80 | HR 84 | Temp 98.7°F | Resp 12 | Wt 183.2 lb

## 2013-03-31 DIAGNOSIS — I10 Essential (primary) hypertension: Secondary | ICD-10-CM

## 2013-03-31 DIAGNOSIS — E663 Overweight: Secondary | ICD-10-CM

## 2013-03-31 DIAGNOSIS — E119 Type 2 diabetes mellitus without complications: Secondary | ICD-10-CM

## 2013-03-31 NOTE — Progress Notes (Signed)
  Subjective:    Patient ID: Alyssa Schultz, female    DOB: 1965-09-05, 47 y.o.   MRN: [id number]  HPI  Patient is a pleasant 47 y/o female who is here for follow-up diabetes. She is exercising regularly. She has brought in her BG log. [name] is disappointed that she has only lost 1 lb. She is also experiencing stress at work with coworkers who are not very cooperative and do not play well with others. She feels that this stress is contributing somewhat to her weight loss slowing down.    Current Outpatient Prescriptions on File Prior to Visit  Medication Sig Dispense Refill  . glucose blood (ACCU-CHEK AVIVA PLUS) test strip Use as instructed  100 each  1  . glucose blood test strip Patient is testing at home 1-2 times daily.  100 each  12  . Lancets (ACCU-CHEK MULTICLIX) lancets Use as instructed to check blood sugar  100 each  4  . Lancets (ACCU-CHEK SOFT TOUCH) lancets Use as instructed to check blood sugar  100 each  12  . lisinopril (PRINIVIL) 10 MG tablet Take 1 tablet (10 mg total) by mouth daily.  90 tablet  0  . metFORMIN (GLUCOPHAGE) 500 MG tablet Take 500 mg by mouth 2 (two) times daily with a meal. 1/2 tablet in the AM, 1 tablet in the PM      . nystatin (MYCOSTATIN) powder Apply topically 4 (four) times daily.  60 g  2  . MICROLET LANCETS MISC 1 each by Does not apply route 2 (two) times daily.  100 each  6   No current facility-administered medications on file prior to visit.     Review of Systems  Constitutional: Negative for fever and chills.  HENT: Negative.   Eyes: Negative.   Respiratory: Negative.   Cardiovascular: Negative.   Gastrointestinal: Negative.   Endocrine: Negative.   Musculoskeletal: Negative.   Neurological: Negative.   Psychiatric/Behavioral: The patient is nervous/anxious.        Increased stress    BP 112/80  Pulse 84  Temp(Src) 98.7 F (37.1 C) (Oral)  Resp 12  Wt 183 lb 4 oz (83.122 kg)  BMI 29.59 kg/m2  SpO2 98%    Objective:   Physical Exam  Constitutional: She is oriented to person, place, and time. She appears well-developed and well-nourished. No distress.  HENT:  Head: Normocephalic and atraumatic.  Eyes: Conjunctivae and EOM are normal. Pupils are equal, round, and reactive to light.  Neck: Normal range of motion. Neck supple.  Cardiovascular: Normal rate, regular rhythm and normal heart sounds.  Exam reveals no gallop.   No murmur heard. Pulmonary/Chest: Effort normal and breath sounds normal.  Abdominal: Soft. Bowel sounds are normal.  Musculoskeletal: Normal range of motion.  Lymphadenopathy:    She has no cervical adenopathy.  Neurological: She is alert and oriented to person, place, and time.  Skin: Skin is warm and dry.  Psychiatric: She has a normal mood and affect. Her behavior is normal. Judgment and thought content normal.          Assessment & Plan:

## 2013-03-31 NOTE — Telephone Encounter (Signed)
Updated test strips brand

## 2013-03-31 NOTE — Assessment & Plan Note (Signed)
Her blood glucose have all been below 150. Most readings are in the low 100s but she is upset about having some readings in the 120s and very few in the 130s. No lows reported. She has been taking metformin 250 mg in the am and 500 mg in the PM. Instructed to increase morning dose to 500 mg. Follow up in 1 month with Dr. Dan Humphreys

## 2013-03-31 NOTE — Assessment & Plan Note (Signed)
Blood pressure is very well controlled. Continue to follow.

## 2013-03-31 NOTE — Assessment & Plan Note (Signed)
She is down 1 lb which she feels is not adequate. She has definitely lost inches. I have explained that muscle weighs more than fat. I have asked her to take measurements and track this as well. Encouraged her to continue and I have also congratulated her on moving in the right direction. Continue to follow.

## 2013-05-12 ENCOUNTER — Encounter: Payer: Self-pay | Admitting: Internal Medicine

## 2013-05-12 ENCOUNTER — Ambulatory Visit (INDEPENDENT_AMBULATORY_CARE_PROVIDER_SITE_OTHER): Payer: 59 | Admitting: Internal Medicine

## 2013-05-12 VITALS — BP 122/94 | HR 80 | Temp 98.5°F | Wt 181.0 lb

## 2013-05-12 DIAGNOSIS — E663 Overweight: Secondary | ICD-10-CM

## 2013-05-12 DIAGNOSIS — I10 Essential (primary) hypertension: Secondary | ICD-10-CM

## 2013-05-12 DIAGNOSIS — E119 Type 2 diabetes mellitus without complications: Secondary | ICD-10-CM

## 2013-05-12 LAB — COMPREHENSIVE METABOLIC PANEL
ALT: 17 U/L (ref 0–35)
AST: 15 U/L (ref 0–37)
Albumin: 4 g/dL (ref 3.5–5.2)
Alkaline Phosphatase: 42 U/L (ref 39–117)
CO2: 29 mEq/L (ref 19–32)
GFR: 116.37 mL/min (ref >60.00)
Potassium: 3.9 mEq/L (ref 3.5–5.1)
Sodium: 138 mEq/L (ref 135–145)
Total Protein: 7.5 g/dL (ref 6.0–8.3)

## 2013-05-12 LAB — MICROALBUMIN / CREATININE URINE RATIO
Creatinine,U: 45.8 mg/dL
Microalb Creat Ratio: 1.1 mg/g (ref 0.0–30.0)
Microalb, Ur: 0.5 mg/dL (ref 0.0–1.9)

## 2013-05-12 MED ORDER — PHENTERMINE HCL 37.5 MG PO CAPS: 37.5000 mg | ORAL_CAPSULE | ORAL | Status: DC

## 2013-05-12 NOTE — Progress Notes (Signed)
Subjective:    Patient ID: Alyssa Schultz, female    DOB: [birth year], 47 y.o.   MRN: [id number]  HPI 47 year old female with history of diabetes, hypertension, obesity presents for followup. She reports she is generally feeling well. Blood sugars have been well-controlled, typically near 100 fasting. She is compliant with medications. She has been following a healthy diet and has been exercising by running or walking. She denies any new concerns today.  Outpatient Encounter Prescriptions as of 05/12/2013  Medication Sig Dispense Refill  . glucose blood test strip Patient is testing at home 1-2 times daily.  100 each  12  . glucose blood test strip Accu-Check Smart View test strips: Use as instructed      . Lancets (ACCU-CHEK MULTICLIX) lancets Use as instructed to check blood sugar  100 each  4  . Lancets (ACCU-CHEK SOFT TOUCH) lancets Use as instructed to check blood sugar  100 each  12  . lisinopril (PRINIVIL) 10 MG tablet Take 1 tablet (10 mg total) by mouth daily.  90 tablet  0  . metFORMIN (GLUCOPHAGE) 500 MG tablet Take 500 mg by mouth 2 (two) times daily with a meal. 1/2 tablet in the AM, 1 tablet in the PM      . MICROLET LANCETS MISC 1 each by Does not apply route 2 (two) times daily.  100 each  6  . nystatin (MYCOSTATIN) powder Apply topically 4 (four) times daily.  60 g  2  . phentermine 37.5 MG capsule Take 1 capsule (37.5 mg total) by mouth every morning.  30 capsule  1   No facility-administered encounter medications on file as of 05/12/2013.   BP 122/94  Pulse 80  Temp(Src) 98.5 F (36.9 C) (Oral)  Wt 181 lb (82.101 kg)  BMI 29.23 kg/m2  SpO2 96%  Review of Systems  Constitutional: Negative for fever, chills, appetite change, fatigue and unexpected weight change.  HENT: Negative for ear pain, congestion, sore throat, trouble swallowing, neck pain, voice change and sinus pressure.   Eyes: Negative for visual disturbance.  Respiratory: Negative for cough, shortness of  breath, wheezing and stridor.   Cardiovascular: Negative for chest pain, palpitations and leg swelling.  Gastrointestinal: Negative for nausea, vomiting, abdominal pain, diarrhea, constipation, blood in stool, abdominal distention and anal bleeding.  Genitourinary: Negative for dysuria and flank pain.  Musculoskeletal: Negative for myalgias, arthralgias and gait problem.  Skin: Negative for color change and rash.  Neurological: Negative for dizziness and headaches.  Hematological: Negative for adenopathy. Does not bruise/bleed easily.  Psychiatric/Behavioral: Negative for suicidal ideas, sleep disturbance and dysphoric mood. The patient is not nervous/anxious.        Objective:   Physical Exam  Constitutional: She is oriented to person, place, and time. She appears well-developed and well-nourished. No distress.  HENT:  Head: Normocephalic and atraumatic.  Right Ear: External ear normal.  Left Ear: External ear normal.  Nose: Nose normal.  Mouth/Throat: Oropharynx is clear and moist. No oropharyngeal exudate.  Eyes: Conjunctivae are normal. Pupils are equal, round, and reactive to light. Right eye exhibits no discharge. Left eye exhibits no discharge. No scleral icterus.  Neck: Normal range of motion. Neck supple. No tracheal deviation present. No thyromegaly present.  Cardiovascular: Normal rate, regular rhythm, normal heart sounds and intact distal pulses.  Exam reveals no gallop and no friction rub.   No murmur heard. Pulmonary/Chest: Effort normal and breath sounds normal. No accessory muscle usage. Not tachypneic. No respiratory distress. She has  no decreased breath sounds. She has no wheezes. She has no rhonchi. She has no rales. She exhibits no tenderness.  Musculoskeletal: Normal range of motion. She exhibits no edema and no tenderness.  Lymphadenopathy:    She has no cervical adenopathy.  Neurological: She is alert and oriented to person, place, and time. No cranial nerve  deficit. She exhibits normal muscle tone. Coordination normal.  Skin: Skin is warm and dry. No rash noted. She is not diaphoretic. No erythema. No pallor.  Psychiatric: She has a normal mood and affect. Her behavior is normal. Judgment and thought content normal.          Assessment & Plan:

## 2013-05-12 NOTE — Assessment & Plan Note (Signed)
BP Readings from Last 3 Encounters:  05/12/13 122/94  03/31/13 112/80  03/02/13 118/84   BP well controlled on lisinopril. Will continue.

## 2013-05-12 NOTE — Assessment & Plan Note (Signed)
Congratulated pt on weight loss. Encouraged continued effort at healthy diet and regular physical activity. Continue phentermine to help with appetite suppression.

## 2013-05-12 NOTE — Assessment & Plan Note (Signed)
Lab Results  Component Value Date   HGBA1C 6.1 05/12/2013   Excellent control of blood sugars. Continue current medications.

## 2013-05-16 ENCOUNTER — Encounter: Payer: Self-pay | Admitting: Internal Medicine

## 2013-05-26 ENCOUNTER — Telehealth: Payer: Self-pay | Admitting: Internal Medicine

## 2013-05-26 NOTE — Telephone Encounter (Signed)
I tried calling patient at the number of [phone] which came and said that it was disconnected. I did try calling the other number that she had on file which was [phone] and it said the wireless caller you are calling is unavailable. I was going to offer Saturday clinic to patient since she would need to be seen.

## 2013-05-26 NOTE — Telephone Encounter (Signed)
Called patient, she state her great toe is hurting and throbbing. She knows it is gout, because she read it online and does not want any medications she just want to call to let us know. Told patient she needs to be seen to make sure it not something else going on. Please advise.

## 2013-05-26 NOTE — Telephone Encounter (Signed)
Pt is in a lot of pain and thinks she has gout. She wanted me to send a message back and see what she should do. O told her that Dr. Dan Humphreys was out of office and she didn't want to speak with a triage burse but to [facility].

## 2013-06-09 ENCOUNTER — Ambulatory Visit: Payer: 59 | Admitting: Internal Medicine

## 2013-06-22 ENCOUNTER — Other Ambulatory Visit: Payer: Self-pay

## 2013-07-05 ENCOUNTER — Ambulatory Visit: Payer: Self-pay | Admitting: Internal Medicine

## 2013-07-11 ENCOUNTER — Other Ambulatory Visit: Payer: Self-pay | Admitting: Internal Medicine

## 2013-07-11 DIAGNOSIS — I10 Essential (primary) hypertension: Secondary | ICD-10-CM

## 2013-07-11 MED ORDER — LISINOPRIL 10 MG PO TABS: 10.0000 mg | ORAL_TABLET | Freq: Every day | ORAL | Status: DC

## 2013-07-18 ENCOUNTER — Encounter: Payer: Self-pay | Admitting: Internal Medicine

## 2013-07-24 ENCOUNTER — Encounter: Payer: Self-pay | Admitting: Internal Medicine

## 2013-07-27 ENCOUNTER — Ambulatory Visit: Payer: Self-pay

## 2013-07-27 LAB — URINALYSIS, COMPLETE
Bacteria: NEGATIVE
Bilirubin,UR: NEGATIVE
Blood: NEGATIVE
Leukocyte Esterase: NEGATIVE
Nitrite: NEGATIVE
Protein: NEGATIVE
RBC,UR: NONE SEEN /HPF (ref 0–5)
Specific Gravity: 1.005 (ref 1.003–1.030)

## 2013-07-29 LAB — URINE CULTURE

## 2013-07-31 ENCOUNTER — Telehealth: Payer: Self-pay | Admitting: Internal Medicine

## 2013-07-31 ENCOUNTER — Encounter: Payer: Self-pay | Admitting: Internal Medicine

## 2013-07-31 NOTE — Telephone Encounter (Signed)
Dr. Dan Humphreys responded via myChart, see other note.

## 2013-07-31 NOTE — Telephone Encounter (Signed)
Pt wanted to inform Dr. Dan Humphreys she rs her appt from 12/30 to February because she has not been doing well with her diet and would like more time to do better.  States if Dr. Dan Humphreys feels this is too long to let her know and she will come sooner but does prefer to wait if it is okay.

## 2013-08-14 ENCOUNTER — Ambulatory Visit: Payer: 59 | Admitting: Internal Medicine

## 2013-08-15 ENCOUNTER — Ambulatory Visit: Payer: 59 | Admitting: Internal Medicine

## 2013-08-31 ENCOUNTER — Encounter: Payer: Self-pay | Admitting: Internal Medicine

## 2013-09-29 ENCOUNTER — Ambulatory Visit: Payer: 59 | Admitting: Internal Medicine

## 2013-10-02 ENCOUNTER — Encounter: Payer: Self-pay | Admitting: Emergency Medicine

## 2013-10-02 ENCOUNTER — Ambulatory Visit (INDEPENDENT_AMBULATORY_CARE_PROVIDER_SITE_OTHER): Payer: 59 | Admitting: Internal Medicine

## 2013-10-02 ENCOUNTER — Encounter: Payer: Self-pay | Admitting: Internal Medicine

## 2013-10-02 ENCOUNTER — Ambulatory Visit: Payer: 59 | Admitting: Internal Medicine

## 2013-10-02 VITALS — BP 130/90 | HR 76 | Temp 98.0°F | Wt 189.8 lb

## 2013-10-02 DIAGNOSIS — N760 Acute vaginitis: Secondary | ICD-10-CM | POA: Insufficient documentation

## 2013-10-02 DIAGNOSIS — R209 Unspecified disturbances of skin sensation: Secondary | ICD-10-CM

## 2013-10-02 DIAGNOSIS — R200 Anesthesia of skin: Secondary | ICD-10-CM

## 2013-10-02 DIAGNOSIS — E663 Overweight: Secondary | ICD-10-CM

## 2013-10-02 DIAGNOSIS — H6692 Otitis media, unspecified, left ear: Secondary | ICD-10-CM

## 2013-10-02 DIAGNOSIS — H6690 Otitis media, unspecified, unspecified ear: Secondary | ICD-10-CM

## 2013-10-02 DIAGNOSIS — I10 Essential (primary) hypertension: Secondary | ICD-10-CM

## 2013-10-02 DIAGNOSIS — E119 Type 2 diabetes mellitus without complications: Secondary | ICD-10-CM

## 2013-10-02 LAB — COMPREHENSIVE METABOLIC PANEL
ALBUMIN: 4.2 g/dL (ref 3.5–5.2)
ALK PHOS: 51 U/L (ref 39–117)
ALT: 19 U/L (ref 0–35)
AST: 17 U/L (ref 0–37)
BUN: 13 mg/dL (ref 6–23)
CO2: 29 mEq/L (ref 19–32)
CREATININE: 0.7 mg/dL (ref 0.4–1.2)
Calcium: 9.4 mg/dL (ref 8.4–10.5)
Chloride: 105 mEq/L (ref 96–112)
GFR: 112.41 mL/min (ref >60.00)
GLUCOSE: 184 mg/dL — AB (ref 70–99)
Potassium: 4.1 mEq/L (ref 3.5–5.1)
Sodium: 140 mEq/L (ref 135–145)
Total Bilirubin: 1 mg/dL (ref 0.3–1.2)
Total Protein: 8.1 g/dL (ref 6.0–8.3)

## 2013-10-02 LAB — MICROALBUMIN / CREATININE URINE RATIO
CREATININE, U: 81.2 mg/dL
MICROALB UR: 2.2 mg/dL — AB (ref 0.0–1.9)
Microalb Creat Ratio: 2.7 mg/g (ref 0.0–30.0)

## 2013-10-02 LAB — LIPID PANEL
CHOLESTEROL: 213 mg/dL — AB (ref 0–200)
HDL: 59.5 mg/dL (ref >39.00)
Total CHOL/HDL Ratio: 4
Triglycerides: 121 mg/dL (ref 0.0–149.0)
VLDL: 24.2 mg/dL (ref 0.0–40.0)

## 2013-10-02 LAB — HEMOGLOBIN A1C: Hgb A1c MFr Bld: 7.1 % — ABNORMAL HIGH (ref 4.6–6.5)

## 2013-10-02 LAB — LDL CHOLESTEROL, DIRECT: LDL DIRECT: 153 mg/dL

## 2013-10-02 MED ORDER — PHENTERMINE HCL 37.5 MG PO CAPS: 37.5000 mg | ORAL_CAPSULE | ORAL | Status: DC

## 2013-10-02 MED ORDER — AMOXICILLIN-POT CLAVULANATE 875-125 MG PO TABS: 1.0000 | ORAL_TABLET | Freq: Two times a day (BID) | ORAL | Status: DC

## 2013-10-02 NOTE — Progress Notes (Signed)
Subjective:    Patient ID: Alyssa Schultz, female    DOB: May 14, 1966, 48 y.o.   MRN: [id number]  HPI 48YO female with DM and HTN presents for follow up. Difficult time for her recently. Declared bankruptcy. Nearly lost her home. Has not been taking medications. BG fasting typically near 130s. No exercise recently. Would like to get back on board with diet and exercise.  Concerned about several weeks of intermittent left distal arm numbness. Symptoms occur with certain positions of her arm. Resolve with change in position. No pain, weakness. No right arm symptoms. No neck or back pain. No trauma noted to arm or back.  Also notes intermittent vaginal discharge. No pelvic pain, fever. No fever, chills. No urinary symptoms.  Review of Systems  Constitutional: Negative for fever, chills, appetite change, fatigue and unexpected weight change.  HENT: Negative for congestion, ear pain, sinus pressure, sore throat, trouble swallowing and voice change.   Eyes: Negative for visual disturbance.  Respiratory: Negative for cough, shortness of breath, wheezing and stridor.   Cardiovascular: Negative for chest pain, palpitations and leg swelling.  Gastrointestinal: Negative for nausea, vomiting, abdominal pain, diarrhea, constipation, blood in stool, abdominal distention and anal bleeding.  Genitourinary: Positive for vaginal discharge. Negative for dysuria and flank pain.  Musculoskeletal: Negative for arthralgias, gait problem, myalgias and neck pain.  Skin: Negative for color change and rash.  Neurological: Positive for numbness (left arm). Negative for dizziness, tremors, weakness and headaches.  Hematological: Negative for adenopathy. Does not bruise/bleed easily.  Psychiatric/Behavioral: Positive for dysphoric mood. Negative for suicidal ideas and sleep disturbance. The patient is nervous/anxious.        Objective:    BP 130/90  Pulse 76  Temp(Src) 98 F (36.7 C) (Oral)  Wt 189 lb 12 oz  (86.07 kg)  SpO2 95% Physical Exam  Constitutional: She is oriented to person, place, and time. She appears well-developed and well-nourished. No distress.  HENT:  Head: Normocephalic and atraumatic.  Right Ear: External ear normal.  Left Ear: External ear normal.  Nose: Nose normal.  Mouth/Throat: Oropharynx is clear and moist. No oropharyngeal exudate.  Eyes: Conjunctivae are normal. Pupils are equal, round, and reactive to light. Right eye exhibits no discharge. Left eye exhibits no discharge. No scleral icterus.  Neck: Normal range of motion. Neck supple. No tracheal deviation present. No thyromegaly present.  Cardiovascular: Normal rate, regular rhythm, normal heart sounds and intact distal pulses.  Exam reveals no gallop and no friction rub.   No murmur heard. Pulmonary/Chest: Effort normal and breath sounds normal. No accessory muscle usage. Not tachypneic. No respiratory distress. She has no decreased breath sounds. She has no wheezes. She has no rhonchi. She has no rales. She exhibits no tenderness.  Musculoskeletal: Normal range of motion. She exhibits no edema and no tenderness.  Lymphadenopathy:    She has no cervical adenopathy.  Neurological: She is alert and oriented to person, place, and time. No cranial nerve deficit. She exhibits normal muscle tone. Coordination normal.  Skin: Skin is warm and dry. No rash noted. She is not diaphoretic. No erythema. No pallor.  Psychiatric: She has a normal mood and affect. Her behavior is normal. Judgment and thought content normal.          Assessment & Plan:   Problem List Items Addressed This Visit   Diabetes mellitus type 2, controlled - Primary     Will check A1c with labs today. Continue Metformin. Foot exam normal today.  Relevant Orders      Comprehensive metabolic panel      Hemoglobin A1c      Microalbumin / creatinine urine ratio      Lipid panel   Hypertension      BP Readings from Last 3 Encounters:  10/02/13  130/90  05/12/13 122/94  03/31/13 112/80   BP elevated today, however pt is tearful. Will monitor BP closely at her work [employer]). Call if persistently >140/90. Continue lisinopril. Check renal function with labs today.    Left arm numbness     Intermittent distal left arm numbness. Suspect radiculopathy, however no back pain. We also discussed that lateral epicondylitis may cause these symptoms, however no tenderness over the lateral epicondyle to suggest this. Will set up neurology evaluation for possible EMG testing and MRI cervical spine.    Relevant Orders      Ambulatory referral to Neurology   Left otitis media     Symptoms and exam consistent with left OM. Will start Augmentin. Ibuprofen prn pain. Follow up prn.    Relevant Medications      AMOXICILLIN-POT CLAVULANATE 875-125 MG PO TABS   Overweight     Encouraged healthy diet and exercise. Will resume phentermine to help with appetite suppression. Follow up 4 weeks and prn.    Relevant Medications      phentermine capsule   Vaginitis and vulvovaginitis     Will send vaginal swab today for BV.    Relevant Orders      Culture, routine-genital       Return in about 4 weeks (around 10/30/2013) for Recheck of Diabetes.

## 2013-10-02 NOTE — Progress Notes (Signed)
Pre-visit discussion using our clinic review tool. No additional management support is needed unless otherwise documented below in the visit note.  

## 2013-10-02 NOTE — Assessment & Plan Note (Signed)
BP Readings from Last 3 Encounters:  10/02/13 130/90  05/12/13 122/94  03/31/13 112/80   BP elevated today, however pt is tearful. Will monitor BP closely at her work [employer]). Call if persistently >140/90. Continue lisinopril. Check renal function with labs today.

## 2013-10-02 NOTE — Assessment & Plan Note (Signed)
Intermittent distal left arm numbness. Suspect radiculopathy, however no back pain. We also discussed that lateral epicondylitis may cause these symptoms, however no tenderness over the lateral epicondyle to suggest this. Will set up neurology evaluation for possible EMG testing and MRI cervical spine.

## 2013-10-02 NOTE — Patient Instructions (Addendum)
We will check A1c with labs today.  Start Augmentin for left ear infection.  Follow up in 4 weeks.

## 2013-10-02 NOTE — Assessment & Plan Note (Signed)
Will send vaginal swab today for BV.

## 2013-10-02 NOTE — Assessment & Plan Note (Signed)
Symptoms and exam consistent with left OM. Will start Augmentin. Ibuprofen prn pain. Follow up prn.

## 2013-10-02 NOTE — Assessment & Plan Note (Signed)
Will check A1c with labs today. Continue Metformin. Foot exam normal today. 

## 2013-10-02 NOTE — Assessment & Plan Note (Signed)
Encouraged healthy diet and exercise. Will resume phentermine to help with appetite suppression. Follow up 4 weeks and prn.

## 2013-10-03 ENCOUNTER — Encounter: Payer: Self-pay | Admitting: Internal Medicine

## 2013-10-04 MED ORDER — METFORMIN HCL 500 MG PO TABS: 500.0000 mg | ORAL_TABLET | Freq: Two times a day (BID) | ORAL | Status: DC

## 2013-10-05 LAB — GC/CHLAMYDIA PROBE AMP
CT Probe RNA: NEGATIVE
GC PROBE AMP APTIMA: NEGATIVE

## 2013-10-17 ENCOUNTER — Ambulatory Visit: Payer: 59 | Admitting: Internal Medicine

## 2013-10-30 ENCOUNTER — Ambulatory Visit: Payer: 59 | Admitting: Internal Medicine

## 2013-11-14 ENCOUNTER — Ambulatory Visit (INDEPENDENT_AMBULATORY_CARE_PROVIDER_SITE_OTHER): Payer: 59 | Admitting: Internal Medicine

## 2013-11-14 ENCOUNTER — Encounter: Payer: Self-pay | Admitting: Internal Medicine

## 2013-11-14 VITALS — BP 118/80 | HR 77 | Temp 98.4°F | Wt 192.0 lb

## 2013-11-14 DIAGNOSIS — E663 Overweight: Secondary | ICD-10-CM

## 2013-11-14 DIAGNOSIS — H6691 Otitis media, unspecified, right ear: Secondary | ICD-10-CM

## 2013-11-14 DIAGNOSIS — E119 Type 2 diabetes mellitus without complications: Secondary | ICD-10-CM

## 2013-11-14 DIAGNOSIS — I10 Essential (primary) hypertension: Secondary | ICD-10-CM

## 2013-11-14 DIAGNOSIS — H6690 Otitis media, unspecified, unspecified ear: Secondary | ICD-10-CM

## 2013-11-14 MED ORDER — LEVOFLOXACIN 500 MG PO TABS: 500.0000 mg | ORAL_TABLET | Freq: Every day | ORAL | Status: DC

## 2013-11-14 NOTE — Assessment & Plan Note (Signed)
Blood sugars are much improved with taking metformin and recent efforts at diet and exercise. We'll plan to repeat A1c in may 2015.

## 2013-11-14 NOTE — Assessment & Plan Note (Signed)
BP Readings from Last 3 Encounters:  11/14/13 118/80  10/02/13 130/90  05/12/13 122/94   BP well controlled with Lisinopril. Will continue to monitor.

## 2013-11-14 NOTE — Assessment & Plan Note (Signed)
Symptoms and exam are consistent with right otitis media. Given that she was recently treated with Augmentin for left ear infection, will change to Levaquin. She will call if symptoms are not improving. Plan for recheck in 2-3 weeks.

## 2013-11-14 NOTE — Progress Notes (Signed)
Pre visit review using our clinic review tool, if applicable. No additional management support is needed unless otherwise documented below in the visit note. 

## 2013-11-14 NOTE — Progress Notes (Signed)
Subjective:    Patient ID: Alyssa Schultz, female    DOB: 1966/02/24, 48 y.o.   MRN: [id number]  HPI 48YO female presents for follow up.  DM - BG sugars running in low 100s. Much improved since she is back on medication and has been following a healthy diet. She is also exercising by participating in an aerobics class or walking daily.  Right ear pain -she notes several days of right ear pain. She was recently treated for left otitis media with Augmentin. Symptoms of left ear pain have resolved. She denies any fever or chills. She denies congestion. She denies change in hearing.  Review of Systems  Constitutional: Negative for fever, chills, appetite change, fatigue and unexpected weight change.  HENT: Positive for ear pain. Negative for congestion, sinus pressure, sore throat, trouble swallowing and voice change.   Eyes: Negative for visual disturbance.  Respiratory: Negative for cough, shortness of breath, wheezing and stridor.   Cardiovascular: Negative for chest pain, palpitations and leg swelling.  Gastrointestinal: Negative for nausea, vomiting, abdominal pain, diarrhea, constipation, blood in stool, abdominal distention and anal bleeding.  Genitourinary: Negative for dysuria and flank pain.  Musculoskeletal: Negative for arthralgias, gait problem, myalgias and neck pain.  Skin: Negative for color change and rash.  Neurological: Negative for dizziness and headaches.  Hematological: Negative for adenopathy. Does not bruise/bleed easily.  Psychiatric/Behavioral: Negative for suicidal ideas, sleep disturbance and dysphoric mood. The patient is not nervous/anxious.        Objective:    BP 118/80  Pulse 77  Temp(Src) 98.4 F (36.9 C) (Oral)  Wt 192 lb (87.091 kg)  SpO2 96% Physical Exam  Constitutional: She is oriented to person, place, and time. She appears well-developed and well-nourished. No distress.  HENT:  Head: Normocephalic and atraumatic.  Right Ear: External ear  normal. Tympanic membrane is erythematous and bulging. A middle ear effusion is present.  Left Ear: Tympanic membrane and external ear normal.  Nose: Nose normal.  Mouth/Throat: Oropharynx is clear and moist. No oropharyngeal exudate.  Eyes: Conjunctivae are normal. Pupils are equal, round, and reactive to light. Right eye exhibits no discharge. Left eye exhibits no discharge. No scleral icterus.  Neck: Normal range of motion. Neck supple. No tracheal deviation present. No thyromegaly present.  Cardiovascular: Normal rate, regular rhythm, normal heart sounds and intact distal pulses.  Exam reveals no gallop and no friction rub.   No murmur heard. Pulmonary/Chest: Effort normal and breath sounds normal. No accessory muscle usage. Not tachypneic. No respiratory distress. She has no decreased breath sounds. She has no wheezes. She has no rhonchi. She has no rales. She exhibits no tenderness.  Musculoskeletal: Normal range of motion. She exhibits no edema and no tenderness.  Lymphadenopathy:    She has no cervical adenopathy.  Neurological: She is alert and oriented to person, place, and time. No cranial nerve deficit. She exhibits normal muscle tone. Coordination normal.  Skin: Skin is warm and dry. No rash noted. She is not diaphoretic. No erythema. No pallor.  Psychiatric: She has a normal mood and affect. Her behavior is normal. Judgment and thought content normal.          Assessment & Plan:   Problem List Items Addressed This Visit   Diabetes mellitus type 2, controlled - Primary     Blood sugars are much improved with taking metformin and recent efforts at diet and exercise. We'll plan to repeat A1c in may 2015.    Relevant Orders  Comprehensive metabolic panel      Hemoglobin A1c      Microalbumin / creatinine urine ratio   Hypertension      BP Readings from Last 3 Encounters:  11/14/13 118/80  10/02/13 130/90  05/12/13 122/94   BP well controlled with Lisinopril. Will  continue to monitor.    Overweight      Wt Readings from Last 3 Encounters:  11/14/13 192 lb (87.091 kg)  10/02/13 189 lb 12 oz (86.07 kg)  05/12/13 181 lb (82.101 kg)   Encouraged efforts at healthy diet and regular physical activity. Will continue phentermine for appetite suppression.    Right otitis media     Symptoms and exam are consistent with right otitis media. Given that she was recently treated with Augmentin for left ear infection, will change to Levaquin. She will call if symptoms are not improving. Plan for recheck in 2-3 weeks.    Relevant Medications      levofloxacin (LEVAQUIN) tablet       Return in about 2 months (around 01/14/2014).

## 2013-11-14 NOTE — Assessment & Plan Note (Signed)
Wt Readings from Last 3 Encounters:  11/14/13 192 lb (87.091 kg)  10/02/13 189 lb 12 oz (86.07 kg)  05/12/13 181 lb (82.101 kg)   Encouraged efforts at healthy diet and regular physical activity. Will continue phentermine for appetite suppression.

## 2013-11-28 ENCOUNTER — Telehealth: Payer: Self-pay

## 2013-11-28 NOTE — Telephone Encounter (Signed)
Relevant patient education assigned to patient using Emmi. ° °

## 2013-12-01 ENCOUNTER — Ambulatory Visit: Payer: Self-pay | Admitting: Physician Assistant

## 2013-12-18 ENCOUNTER — Encounter: Payer: Self-pay | Admitting: Internal Medicine

## 2013-12-19 ENCOUNTER — Other Ambulatory Visit (INDEPENDENT_AMBULATORY_CARE_PROVIDER_SITE_OTHER): Payer: 59

## 2013-12-19 ENCOUNTER — Telehealth: Payer: Self-pay | Admitting: *Deleted

## 2013-12-19 ENCOUNTER — Encounter: Payer: Self-pay | Admitting: Internal Medicine

## 2013-12-19 DIAGNOSIS — L293 Anogenital pruritus, unspecified: Secondary | ICD-10-CM

## 2013-12-19 DIAGNOSIS — N898 Other specified noninflammatory disorders of vagina: Secondary | ICD-10-CM

## 2013-12-19 LAB — URINALYSIS
BILIRUBIN URINE: NEGATIVE
Hgb urine dipstick: NEGATIVE
KETONES UR: NEGATIVE
Leukocytes, UA: NEGATIVE
Nitrite: NEGATIVE
PH: 6.5 (ref 5.0–8.0)
Specific Gravity, Urine: 1.01 (ref 1.000–1.030)
Total Protein, Urine: NEGATIVE
UROBILINOGEN UA: 0.2 (ref 0.0–1.0)
Urine Glucose: NEGATIVE

## 2013-12-19 NOTE — Telephone Encounter (Signed)
Pt left a urine sample and did a swab she said that you told her it would be ok??  But i didn't see no orders ?

## 2013-12-19 NOTE — Telephone Encounter (Signed)
[facility] lab called, stating the vaginal swab that was sent in on patient was incorrect, they are unable to run the requested tests. Would need recollect.

## 2013-12-19 NOTE — Telephone Encounter (Signed)
Can we please clarify with Hoyle Sauer and ask pt to recollect sample?

## 2013-12-19 NOTE — Telephone Encounter (Signed)
I sent her a MyChart message saying that she needed to schedule a visit, but we can order (this time only) vaginal culture and UA.

## 2013-12-20 ENCOUNTER — Other Ambulatory Visit (HOSPITAL_COMMUNITY)
Admission: RE | Admit: 2013-12-20 | Discharge: 2013-12-20 | Disposition: A | Discharge status: Home / Self Care | Payer: 59 | Source: Ambulatory Visit | Attending: Internal Medicine | Admitting: Internal Medicine

## 2013-12-20 DIAGNOSIS — Z113 Encounter for screening for infections with a predominantly sexual mode of transmission: Secondary | ICD-10-CM | POA: Insufficient documentation

## 2013-12-20 DIAGNOSIS — N760 Acute vaginitis: Secondary | ICD-10-CM | POA: Insufficient documentation

## 2013-12-20 NOTE — Telephone Encounter (Signed)
OK. Can you call her and have her come back in to re-swab?

## 2013-12-20 NOTE — Telephone Encounter (Signed)
Called cytology, there are two swabs in the pack one to clean and the other swab for collection and the pt put the one to clean in the vital

## 2013-12-20 NOTE — Telephone Encounter (Signed)
Just FYI- I sent mychart to patient to recollect.

## 2013-12-20 NOTE — Telephone Encounter (Signed)
Called pt she said she would come back today in the afternoon

## 2013-12-21 ENCOUNTER — Other Ambulatory Visit: Payer: Self-pay | Admitting: *Deleted

## 2013-12-21 ENCOUNTER — Telehealth: Payer: Self-pay | Admitting: *Deleted

## 2013-12-21 LAB — CERVICOVAGINAL ANCILLARY ONLY
CHLAMYDIA, DNA PROBE: NEGATIVE
NEISSERIA GONORRHEA: NEGATIVE
TRICH (WINDOWPATH): NEGATIVE

## 2013-12-21 MED ORDER — CIPROFLOXACIN HCL 500 MG PO TABS: 500.0000 mg | ORAL_TABLET | Freq: Two times a day (BID) | ORAL | Status: DC

## 2013-12-21 NOTE — Telephone Encounter (Signed)
I called cytology and they said they were a little backed up and they will be processing it soon

## 2013-12-21 NOTE — Telephone Encounter (Signed)
Message copied by Lilyan Punt on Thu Dec 21, 2013  2:22 PM ------      Message from: Ronette Deter A      Created: Thu Dec 21, 2013  1:13 PM       Can we make sure that BV testing was included? ------

## 2013-12-22 LAB — URINE CULTURE

## 2013-12-26 ENCOUNTER — Other Ambulatory Visit: Payer: Self-pay | Admitting: Internal Medicine

## 2013-12-26 LAB — CERVICOVAGINAL ANCILLARY ONLY
Bacterial vaginitis: POSITIVE — AB
Bacterial vaginitis: POSITIVE — AB
CANDIDA VAGINITIS: NEGATIVE

## 2013-12-26 MED ORDER — METRONIDAZOLE 500 MG PO TABS: 500.0000 mg | ORAL_TABLET | Freq: Two times a day (BID) | ORAL | Status: DC

## 2014-02-20 ENCOUNTER — Other Ambulatory Visit: Payer: Self-pay | Admitting: Internal Medicine

## 2014-02-20 NOTE — Telephone Encounter (Signed)
Spoke with pt advised Rx denied until seen.

## 2014-02-20 NOTE — Telephone Encounter (Signed)
Last refill 5.29.15, last OV 3.31.15, no future OV.  Please advise refill.

## 2014-03-07 ENCOUNTER — Other Ambulatory Visit: Payer: Self-pay | Admitting: Internal Medicine

## 2014-03-23 ENCOUNTER — Encounter: Payer: Self-pay | Admitting: Adult Health

## 2014-03-23 ENCOUNTER — Ambulatory Visit (INDEPENDENT_AMBULATORY_CARE_PROVIDER_SITE_OTHER): Payer: 59 | Admitting: Adult Health

## 2014-03-23 VITALS — BP 119/86 | HR 76 | Temp 97.7°F | Resp 14 | Wt 195.5 lb

## 2014-03-23 DIAGNOSIS — E119 Type 2 diabetes mellitus without complications: Secondary | ICD-10-CM

## 2014-03-23 DIAGNOSIS — E663 Overweight: Secondary | ICD-10-CM

## 2014-03-23 LAB — COMPREHENSIVE METABOLIC PANEL
ALBUMIN: 4.1 g/dL (ref 3.5–5.2)
ALT: 23 U/L (ref 0–35)
AST: 22 U/L (ref 0–37)
Alkaline Phosphatase: 50 U/L (ref 39–117)
BUN: 10 mg/dL (ref 6–23)
CALCIUM: 9.1 mg/dL (ref 8.4–10.5)
CHLORIDE: 103 meq/L (ref 96–112)
CO2: 26 meq/L (ref 19–32)
CREATININE: 0.7 mg/dL (ref 0.4–1.2)
GFR: 112.2 mL/min (ref >60.00)
Glucose, Bld: 120 mg/dL — ABNORMAL HIGH (ref 70–99)
POTASSIUM: 3.8 meq/L (ref 3.5–5.1)
Sodium: 138 mEq/L (ref 135–145)
Total Bilirubin: 0.6 mg/dL (ref 0.2–1.2)
Total Protein: 7.5 g/dL (ref 6.0–8.3)

## 2014-03-23 LAB — MICROALBUMIN / CREATININE URINE RATIO
CREATININE, U: 65.3 mg/dL
Microalb Creat Ratio: 1.4 mg/g (ref 0.0–30.0)
Microalb, Ur: 0.9 mg/dL (ref 0.0–1.9)

## 2014-03-23 LAB — HEMOGLOBIN A1C: Hgb A1c MFr Bld: 7.2 % — ABNORMAL HIGH (ref 4.6–6.5)

## 2014-03-23 MED ORDER — PHENTERMINE HCL 37.5 MG PO CAPS
37.5000 mg | ORAL_CAPSULE | ORAL | Status: DC
Start: 2014-03-23 — End: 2014-09-12

## 2014-03-23 NOTE — Progress Notes (Signed)
Patient ID: Alyssa Schultz, female   DOB: 1966-04-04, 48 y.o.   MRN: [id number]   Subjective:    Patient ID: Alyssa Schultz, female    DOB: 04/02/1966, 48 y.o.   MRN: [id number]  HPI Pt is a pleasant 48 y/o female who presents for her diabetes f/u and for refill on phentermine. She has been doing well. She is starting a boot camp for exercise. Had her diabetic eye exam today.   Past Medical History  Diagnosis Date  . Diabetes mellitus   . Hypertension   . Hyperlipidemia     Current Outpatient Prescriptions on File Prior to Visit  Medication Sig Dispense Refill  . ciprofloxacin (CIPRO) 500 MG tablet Take 1 tablet (500 mg total) by mouth 2 (two) times daily. For 7 days  14 tablet  0  . glucose blood test strip Patient is testing at home 1-2 times daily.  100 each  12  . glucose blood test strip Accu-Check Smart View test strips: Use as instructed      . Lancets (ACCU-CHEK MULTICLIX) lancets Use as instructed to check blood sugar  100 each  4  . Lancets (ACCU-CHEK SOFT TOUCH) lancets Use as instructed to check blood sugar  100 each  12  . levofloxacin (LEVAQUIN) 500 MG tablet Take 1 tablet (500 mg total) by mouth daily.  7 tablet  0  . lisinopril (PRINIVIL,ZESTRIL) 10 MG tablet TAKE ONE TABLET BY MOUTH DAILY  90 tablet  1  . metFORMIN (GLUCOPHAGE) 500 MG tablet Take 1 tablet (500 mg total) by mouth 2 (two) times daily with a meal. 1/2 tablet in the AM, 1 tablet in the PM  45 tablet  6  . metroNIDAZOLE (FLAGYL) 500 MG tablet Take 1 tablet (500 mg total) by mouth 2 (two) times daily.  14 tablet  0  . MICROLET LANCETS MISC 1 each by Does not apply route 2 (two) times daily.  100 each  6  . nystatin (MYCOSTATIN) powder Apply topically 4 (four) times daily.  60 g  2   No current facility-administered medications on file prior to visit.     Review of Systems  Constitutional: Negative.   HENT: Negative.   Eyes: Negative.   Respiratory: Negative.   Cardiovascular: Negative.     Gastrointestinal: Negative.   Endocrine: Negative.   Genitourinary: Negative.   Musculoskeletal: Negative.   Skin: Negative.   Allergic/Immunologic: Negative.   Neurological: Negative.   Hematological: Negative.   Psychiatric/Behavioral: Negative.        Objective:  BP 119/86  Pulse 76  Temp(Src) 97.7 F (36.5 C) (Oral)  Resp 14  Wt 195 lb 8 oz (88.678 kg)  SpO2 96%   Physical Exam  Constitutional: She is oriented to person, place, and time. No distress.  Cardiovascular: Normal rate and regular rhythm.   Pulmonary/Chest: Effort normal. No respiratory distress.  Musculoskeletal: Normal range of motion.  Neurological: She is alert and oriented to person, place, and time.  Skin: Skin is warm and dry.  Psychiatric: She has a normal mood and affect. Her behavior is normal. Judgment and thought content normal.      Assessment & Plan:   1. Overweight(278.02) Will start boot camp for exercise. Start phentermine for appetite control - phentermine 37.5 MG capsule; Take 1 capsule (37.5 mg total) by mouth every morning.  Dispense: 30 capsule; Refill: 2  2. Diabetes mellitus type 2, controlled Check her labs. Follow up in 3 month with PCP - Comprehensive  metabolic panel - Hemoglobin A1c - Microalbumin / creatinine urine ratio

## 2014-03-27 ENCOUNTER — Encounter: Payer: Self-pay | Admitting: Internal Medicine

## 2014-05-01 ENCOUNTER — Encounter: Payer: Self-pay | Admitting: Internal Medicine

## 2014-05-21 ENCOUNTER — Encounter: Payer: Self-pay | Admitting: Internal Medicine

## 2014-07-06 ENCOUNTER — Encounter: Payer: Self-pay | Admitting: *Deleted

## 2014-07-06 ENCOUNTER — Ambulatory Visit: Payer: Self-pay | Admitting: Internal Medicine

## 2014-07-06 LAB — HM MAMMOGRAPHY: HM Mammogram: NEGATIVE

## 2014-07-18 ENCOUNTER — Encounter: Payer: Self-pay | Admitting: Internal Medicine

## 2014-08-01 ENCOUNTER — Encounter: Payer: Self-pay | Admitting: Internal Medicine

## 2014-08-05 ENCOUNTER — Ambulatory Visit: Payer: Self-pay | Admitting: Physician Assistant

## 2014-08-06 ENCOUNTER — Encounter: Payer: Self-pay | Admitting: Internal Medicine

## 2014-08-06 MED ORDER — METFORMIN HCL 500 MG PO TABS: ORAL_TABLET | ORAL | Status: DC

## 2014-08-07 ENCOUNTER — Encounter: Payer: Self-pay | Admitting: Internal Medicine

## 2014-08-08 ENCOUNTER — Telehealth: Payer: Self-pay | Admitting: Internal Medicine

## 2014-08-08 NOTE — Telephone Encounter (Signed)
Please see below (appt scheduled for 09/12/14)

## 2014-08-08 NOTE — Telephone Encounter (Signed)
Fine to refill a 1 month supply on HCTZ.

## 2014-08-08 NOTE — Telephone Encounter (Signed)
Received this message from pt "I just started on a new med HCTZ they gave me a 30 day supply at [facility]. I will run out before I see Dr. Gilford Rile. Will she refill it for me before I see her? Also I'm going to see Dr. Tamala Julian today about my foot that swollen. I'll send the report over after I leave." Please advise pt/msn

## 2014-08-08 NOTE — Telephone Encounter (Signed)
Left message for pt to return my call, need to know dose of medication and pharmacy

## 2014-08-09 NOTE — Telephone Encounter (Signed)
Spoke with pt yesterday, pt states that she did not request a refill and very abruptly hung up the phone.

## 2014-08-20 ENCOUNTER — Other Ambulatory Visit: Payer: Self-pay | Admitting: Internal Medicine

## 2014-08-24 ENCOUNTER — Encounter: Payer: Self-pay | Admitting: Internal Medicine

## 2014-08-24 MED ORDER — HYDROCHLOROTHIAZIDE 25 MG PO TABS: 25.0000 mg | ORAL_TABLET | Freq: Every day | ORAL | Status: DC

## 2014-08-24 NOTE — Telephone Encounter (Signed)
Not on med list, ok to send as HCTZ 25 mg?

## 2014-09-12 ENCOUNTER — Encounter: Payer: Self-pay | Admitting: Internal Medicine

## 2014-09-12 ENCOUNTER — Ambulatory Visit (INDEPENDENT_AMBULATORY_CARE_PROVIDER_SITE_OTHER): Payer: 59 | Admitting: Internal Medicine

## 2014-09-12 VITALS — BP 109/70 | HR 83 | Temp 97.7°F | Wt 195.2 lb

## 2014-09-12 DIAGNOSIS — S92302A Fracture of unspecified metatarsal bone(s), left foot, initial encounter for closed fracture: Secondary | ICD-10-CM

## 2014-09-12 DIAGNOSIS — E119 Type 2 diabetes mellitus without complications: Secondary | ICD-10-CM

## 2014-09-12 DIAGNOSIS — I10 Essential (primary) hypertension: Secondary | ICD-10-CM

## 2014-09-12 DIAGNOSIS — S92309A Fracture of unspecified metatarsal bone(s), unspecified foot, initial encounter for closed fracture: Secondary | ICD-10-CM | POA: Insufficient documentation

## 2014-09-12 LAB — COMPREHENSIVE METABOLIC PANEL
ALBUMIN: 4.3 g/dL (ref 3.5–5.2)
ALK PHOS: 65 U/L (ref 39–117)
ALT: 18 U/L (ref 0–35)
AST: 17 U/L (ref 0–37)
BILIRUBIN TOTAL: 0.4 mg/dL (ref 0.2–1.2)
BUN: 16 mg/dL (ref 6–23)
CHLORIDE: 101 meq/L (ref 96–112)
CO2: 28 mEq/L (ref 19–32)
CREATININE: 0.81 mg/dL (ref 0.40–1.20)
Calcium: 9.7 mg/dL (ref 8.4–10.5)
GFR: 96.18 mL/min (ref >60.00)
GLUCOSE: 232 mg/dL — AB (ref 70–99)
POTASSIUM: 3.7 meq/L (ref 3.5–5.1)
SODIUM: 136 meq/L (ref 135–145)
Total Protein: 7.9 g/dL (ref 6.0–8.3)

## 2014-09-12 LAB — LIPID PANEL
CHOL/HDL RATIO: 5
Cholesterol: 213 mg/dL — ABNORMAL HIGH (ref 0–200)
HDL: 44.7 mg/dL (ref >39.00)
LDL Cholesterol: 129 mg/dL — ABNORMAL HIGH (ref 0–99)
NonHDL: 168.3
TRIGLYCERIDES: 198 mg/dL — AB (ref 0.0–149.0)
VLDL: 39.6 mg/dL (ref 0.0–40.0)

## 2014-09-12 LAB — MICROALBUMIN / CREATININE URINE RATIO
Creatinine,U: 117.7 mg/dL
MICROALB UR: 1 mg/dL (ref 0.0–1.9)
Microalb Creat Ratio: 0.8 mg/g (ref 0.0–30.0)

## 2014-09-12 LAB — HEMOGLOBIN A1C: HEMOGLOBIN A1C: 9.6 % — AB (ref 4.6–6.5)

## 2014-09-12 MED ORDER — SAXAGLIPTIN HCL 2.5 MG PO TABS: 2.5000 mg | ORAL_TABLET | Freq: Every day | ORAL | Status: DC

## 2014-09-12 MED ORDER — FLUCONAZOLE 150 MG PO TABS: 150.0000 mg | ORAL_TABLET | Freq: Every day | ORAL | Status: DC

## 2014-09-12 NOTE — Assessment & Plan Note (Signed)
Recent left metatarsal fracture. Unable to run because of persistent pain. Will set up podiatry evaluation. Question if orthotic might be helpful.

## 2014-09-12 NOTE — Patient Instructions (Signed)
Start Onglyza 2.5mg  daily with breakfast.  Continue to monitor blood sugars 1-2 times daily.  Follow up in 4 weeks.

## 2014-09-12 NOTE — Assessment & Plan Note (Signed)
Will check A1c with labs. Per her report BG elevated. Will add Onglyza 2.5mg  daily. Discussed risks of this medication. Follow up in 4 weeks and prn.

## 2014-09-12 NOTE — Progress Notes (Signed)
Subjective:    Patient ID: Alyssa Schultz, female    DOB: [birth year], 49 y.o.   MRN: [id number]  HPI 48YO female presents for follow up.  DM - BG have been high. Near 200 fasting. Compliant with metformin.  Recently broke left 2nd metatarsal when running. Foot was swollen, painful. This has improved, however has not started back running because of pain.  Past medical, surgical, family and social history per today's encounter.  Review of Systems  Constitutional: Negative for fever, chills, appetite change, fatigue and unexpected weight change.  Eyes: Negative for visual disturbance.  Respiratory: Negative for shortness of breath.   Cardiovascular: Negative for chest pain and leg swelling.  Gastrointestinal: Negative for nausea, vomiting, abdominal pain, diarrhea and constipation.  Musculoskeletal: Positive for arthralgias. Negative for myalgias.  Skin: Negative for color change and rash.  Hematological: Negative for adenopathy. Does not bruise/bleed easily.  Psychiatric/Behavioral: Negative for sleep disturbance and dysphoric mood. The patient is not nervous/anxious.        Objective:    BP 109/70 mmHg  Pulse 83  Temp(Src) 97.7 F (36.5 C) (Oral)  Wt 195 lb 4 oz (88.565 kg)  SpO2 96% Physical Exam  Constitutional: She is oriented to person, place, and time. She appears well-developed and well-nourished. No distress.  HENT:  Head: Normocephalic and atraumatic.  Right Ear: External ear normal.  Left Ear: External ear normal.  Nose: Nose normal.  Mouth/Throat: Oropharynx is clear and moist. No oropharyngeal exudate.  Eyes: Conjunctivae are normal. Pupils are equal, round, and reactive to light. Right eye exhibits no discharge. Left eye exhibits no discharge. No scleral icterus.  Neck: Normal range of motion. Neck supple. No tracheal deviation present. No thyromegaly present.  Cardiovascular: Normal rate, regular rhythm, normal heart sounds and intact distal pulses.  Exam  reveals no gallop and no friction rub.   No murmur heard. Pulmonary/Chest: Effort normal and breath sounds normal. No accessory muscle usage. No tachypnea. No respiratory distress. She has no decreased breath sounds. She has no wheezes. She has no rhonchi. She has no rales. She exhibits no tenderness.  Musculoskeletal: Normal range of motion. She exhibits no edema or tenderness.       Feet:  Lymphadenopathy:    She has no cervical adenopathy.  Neurological: She is alert and oriented to person, place, and time. No cranial nerve deficit. She exhibits normal muscle tone. Coordination normal.  Skin: Skin is warm and dry. No rash noted. She is not diaphoretic. No erythema. No pallor.  Psychiatric: She has a normal mood and affect. Her behavior is normal. Judgment and thought content normal.          Assessment & Plan:   Problem List Items Addressed This Visit      Unprioritized   Diabetes mellitus type 2, controlled - Primary    Will check A1c with labs. Per her report BG elevated. Will add Onglyza 2.5mg  daily. Discussed risks of this medication. Follow up in 4 weeks and prn.      Relevant Medications   saxagliptin HCl (ONGLYZA) 2.5 MG TABS tablet   Other Relevant Orders   Comprehensive metabolic panel   Hemoglobin A1c   Lipid panel   Microalbumin / creatinine urine ratio   Hypertension    BP Readings from Last 3 Encounters:  09/12/14 109/70  03/23/14 119/86  11/14/13 118/80   BP well controlled. Renal function with labs today. Continue current medication.      Metatarsal fracture  Recent left metatarsal fracture. Unable to run because of persistent pain. Will set up podiatry evaluation. Question if orthotic might be helpful.      Relevant Orders   Ambulatory referral to Podiatry       Return in about 4 weeks (around 10/10/2014) for Recheck of Diabetes.

## 2014-09-12 NOTE — Assessment & Plan Note (Signed)
BP Readings from Last 3 Encounters:  09/12/14 109/70  03/23/14 119/86  11/14/13 118/80   BP well controlled. Renal function with labs today. Continue current medication.

## 2014-09-12 NOTE — Progress Notes (Signed)
Pre visit review using our clinic review tool, if applicable. No additional management support is needed unless otherwise documented below in the visit note. 

## 2014-09-13 ENCOUNTER — Telehealth: Payer: Self-pay | Admitting: Internal Medicine

## 2014-09-13 MED ORDER — METFORMIN HCL 500 MG PO TABS: 500.0000 mg | ORAL_TABLET | Freq: Two times a day (BID) | ORAL | Status: DC

## 2014-09-13 NOTE — Telephone Encounter (Signed)
emmi emailed °

## 2014-09-17 ENCOUNTER — Encounter: Payer: Self-pay | Admitting: Internal Medicine

## 2014-09-17 MED ORDER — BD LANCET ULTRAFINE 33G MISC: Status: DC

## 2014-09-19 ENCOUNTER — Ambulatory Visit: Payer: Self-pay | Admitting: Podiatry

## 2014-09-24 ENCOUNTER — Ambulatory Visit (INDEPENDENT_AMBULATORY_CARE_PROVIDER_SITE_OTHER): Payer: 59

## 2014-09-24 ENCOUNTER — Ambulatory Visit (INDEPENDENT_AMBULATORY_CARE_PROVIDER_SITE_OTHER): Payer: 59 | Admitting: Podiatry

## 2014-09-24 ENCOUNTER — Encounter: Payer: Self-pay | Admitting: Podiatry

## 2014-09-24 VITALS — BP 137/86 | HR 69 | Resp 12

## 2014-09-24 DIAGNOSIS — R52 Pain, unspecified: Secondary | ICD-10-CM

## 2014-09-24 DIAGNOSIS — M722 Plantar fascial fibromatosis: Secondary | ICD-10-CM

## 2014-09-24 DIAGNOSIS — M19172 Post-traumatic osteoarthritis, left ankle and foot: Secondary | ICD-10-CM

## 2014-09-24 MED ORDER — MELOXICAM 15 MG PO TABS: 15.0000 mg | ORAL_TABLET | Freq: Every day | ORAL | Status: DC

## 2014-09-24 NOTE — Progress Notes (Signed)
   Subjective:    Patient ID: Alyssa Schultz, female    DOB: 03/13/1966, 49 y.o.   MRN: [id number]  HPI PT STATED HAVE HISTORY LT FOOT  FRACTURED LAST  November  AND STILL PAINFUL WHEN RUNNING OR WALKING. THE FOOT IS BEEN THE SAME BUT  NOT WORSE. PT WENT TO URGENT CARE IN [place] LET WEAR BOOT AND BRACE AND IT HELP SOME.   Review of Systems  Eyes: Positive for visual disturbance.  All other systems reviewed and are negative.      Objective:   Physical Exam: I have reviewed her past mental history medications allergies surgery social history and review of systems. Pulses are strongly palpable bilateral. Neurologic sensorium is intact percent as was the monofilament. Deep tendon reflexes are intact bilateral and muscle strength is 5 over 5 dorsiflexion plantar flexors inverters and everters onto the musculature is intact. Orthopedic evaluation of his drains all joints distal to the ankle for range of motion without crepitation. Cutaneous evaluation of straight supple well-hydrated cutis no erythema edema cellulitis drainage or odor. She does have pain on palpation of the second metatarsal along the entire length to the level of the second metatarsal intermediate cuneiform base. She has a dorsal exostosis in this area consistent with osteoarthritis which is visible on radiograph. She also has mild tenderness on palpation of the medial calcaneal tubercle of the left heel this is indicative of plantar fasciitis. Cutaneous evaluation does not demonstrate any type of skin breakdown or wear.        Assessment & Plan:  Assessment: Plantar fasciitis of the left foot. 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  Plan: She was scanned for set of orthotics today and I also wrote a prescription for meloxicam 15 mg 1 by mouth daily. I will follow up with her once her orthotics come in.

## 2014-10-10 ENCOUNTER — Other Ambulatory Visit: Payer: Self-pay | Admitting: Internal Medicine

## 2014-10-15 ENCOUNTER — Ambulatory Visit (INDEPENDENT_AMBULATORY_CARE_PROVIDER_SITE_OTHER): Payer: 59 | Admitting: Internal Medicine

## 2014-10-15 ENCOUNTER — Ambulatory Visit (INDEPENDENT_AMBULATORY_CARE_PROVIDER_SITE_OTHER): Payer: 59 | Admitting: *Deleted

## 2014-10-15 ENCOUNTER — Encounter: Payer: Self-pay | Admitting: Internal Medicine

## 2014-10-15 VITALS — BP 110/74 | HR 75 | Temp 97.5°F | Ht 66.0 in | Wt 189.5 lb

## 2014-10-15 DIAGNOSIS — I10 Essential (primary) hypertension: Secondary | ICD-10-CM

## 2014-10-15 DIAGNOSIS — Z6830 Body mass index (BMI) 30.0-30.9, adult: Secondary | ICD-10-CM

## 2014-10-15 DIAGNOSIS — E119 Type 2 diabetes mellitus without complications: Secondary | ICD-10-CM

## 2014-10-15 DIAGNOSIS — M722 Plantar fascial fibromatosis: Secondary | ICD-10-CM

## 2014-10-15 NOTE — Assessment & Plan Note (Signed)
BP Readings from Last 3 Encounters:  10/15/14 110/74  09/24/14 137/86  09/12/14 109/70   BP well controlled. Continue Lisinopril and HCTZ.

## 2014-10-15 NOTE — Progress Notes (Signed)
Pre visit review using our clinic review tool, if applicable. No additional management support is needed unless otherwise documented below in the visit note. 

## 2014-10-15 NOTE — Progress Notes (Signed)
Orthotics dispensed. Instructions given

## 2014-10-15 NOTE — Assessment & Plan Note (Signed)
Wt Readings from Last 3 Encounters:  10/15/14 189 lb 8 oz (85.957 kg)  09/12/14 195 lb 4 oz (88.565 kg)  03/23/14 195 lb 8 oz (88.678 kg)   Body mass index is 30.6 kg/(m^2). The patient is asked to make an attempt to improve diet and exercise patterns to aid in medical management of this problem.

## 2014-10-15 NOTE — Patient Instructions (Signed)

## 2014-10-15 NOTE — Patient Instructions (Signed)
Plan to recheck A1c in 11/2014.

## 2014-10-15 NOTE — Assessment & Plan Note (Addendum)
BG much improved. Encouraged continued healthy diet and exercise. Will continue Onglyza and Metformin. Recheck A1c in 11/2014.

## 2014-10-15 NOTE — Progress Notes (Signed)
Subjective:    Patient ID: Alyssa Schultz, female    DOB: 02/26/1966, 49 y.o.   MRN: [id number]  HPI 48YO female presents for follow up.  DM - Last A1c 9.6%. Started on [place]. Most BG near 80-100 Started walking MWFS. Rides bike on Tues. Goal 30-45 carbs per meal with snacks in between, less than 15g. No side effects with Onglyza.  Wt Readings from Last 3 Encounters:  10/15/14 189 lb 8 oz (85.957 kg)  09/12/14 195 lb 4 oz (88.565 kg)  03/23/14 195 lb 8 oz (88.678 kg)    Past medical, surgical, family and social history per today's encounter.  Review of Systems  Constitutional: Negative for fever, chills, appetite change, fatigue and unexpected weight change.  Eyes: Negative for visual disturbance.  Respiratory: Negative for shortness of breath.   Cardiovascular: Negative for chest pain and leg swelling.  Gastrointestinal: Negative for nausea, vomiting, abdominal pain, diarrhea and constipation.  Musculoskeletal: Negative for myalgias and arthralgias.  Skin: Negative for color change and rash.  Hematological: Negative for adenopathy. Does not bruise/bleed easily.  Psychiatric/Behavioral: Negative for suicidal ideas, sleep disturbance and dysphoric mood. The patient is not nervous/anxious.        Objective:    BP 110/74 mmHg  Pulse 75  Temp(Src) 97.5 F (36.4 C) (Oral)  Ht 5\' 6"  (1.676 m)  Wt 189 lb 8 oz (85.957 kg)  BMI 30.60 kg/m2  SpO2 96% Physical Exam  Constitutional: She is oriented to person, place, and time. She appears well-developed and well-nourished. No distress.  HENT:  Head: Normocephalic and atraumatic.  Right Ear: External ear normal.  Left Ear: External ear normal.  Nose: Nose normal.  Mouth/Throat: Oropharynx is clear and moist. No oropharyngeal exudate.  Eyes: Conjunctivae are normal. Pupils are equal, round, and reactive to light. Right eye exhibits no discharge. Left eye exhibits no discharge. No scleral icterus.  Neck: Normal range of  motion. Neck supple. No tracheal deviation present. No thyromegaly present.  Cardiovascular: Normal rate, regular rhythm, normal heart sounds and intact distal pulses.  Exam reveals no gallop and no friction rub.   No murmur heard. Pulmonary/Chest: Effort normal and breath sounds normal. No respiratory distress. She has no wheezes. She has no rales. She exhibits no tenderness.  Musculoskeletal: Normal range of motion. She exhibits no edema or tenderness.  Lymphadenopathy:    She has no cervical adenopathy.  Neurological: She is alert and oriented to person, place, and time. No cranial nerve deficit. She exhibits normal muscle tone. Coordination normal.  Skin: Skin is warm and dry. No rash noted. She is not diaphoretic. No erythema. No pallor.  Psychiatric: She has a normal mood and affect. Her behavior is normal. Judgment and thought content normal.          Assessment & Plan:  Over 42min of which >50% spent in face-to-face contact with patient discussing plan of care  Problem List Items Addressed This Visit      Unprioritized   BMI 30.0-30.9,adult    Wt Readings from Last 3 Encounters:  10/15/14 189 lb 8 oz (85.957 kg)  09/12/14 195 lb 4 oz (88.565 kg)  03/23/14 195 lb 8 oz (88.678 kg)   Body mass index is 30.6 kg/(m^2). The patient is asked to make an attempt to improve diet and exercise patterns to aid in medical management of this problem.       Diabetes mellitus type 2, controlled - Primary    BG much improved. Encouraged continued  healthy diet and exercise. Will continue Onglyza and Metformin. Recheck A1c in 11/2014.      Hypertension    BP Readings from Last 3 Encounters:  10/15/14 110/74  09/24/14 137/86  09/12/14 109/70   BP well controlled. Continue Lisinopril and HCTZ.          Return in about 3 months (around 01/13/2015) for Recheck of Diabetes.

## 2014-10-31 ENCOUNTER — Encounter: Payer: Self-pay | Admitting: Internal Medicine

## 2014-10-31 MED ORDER — LUBIPROSTONE 24 MCG PO CAPS: 24.0000 ug | ORAL_CAPSULE | Freq: Every day | ORAL | Status: DC

## 2014-11-07 ENCOUNTER — Encounter: Payer: Self-pay | Admitting: Internal Medicine

## 2014-11-07 ENCOUNTER — Ambulatory Visit: Payer: 59 | Admitting: Podiatry

## 2014-11-07 MED ORDER — METRONIDAZOLE 500 MG PO TABS: 500.0000 mg | ORAL_TABLET | Freq: Two times a day (BID) | ORAL | Status: DC

## 2014-11-14 ENCOUNTER — Other Ambulatory Visit: Payer: Self-pay

## 2014-11-14 NOTE — Patient Outreach (Signed)
[name]) Care Management  [place]  11/14/2014   Alyssa Schultz [birth year] [id number]  Subjective: [name] stopped by but was unable to stay for her scheduled appointment because of a work conflict.  She tells me continues to check her blood sugar a number of times through the day and she is maintaining blood sugars less than 120 mg/dl both fasting and after meals.   She continues to track her dietary intake in a phone app and is managing to maintain 30-45gm of carbs per meal and less than 15gm at snacks but 30 gm of carbohydrate at bedtime because she finds her blood sugars are better in the morning when she does.  She continues to loose weight- pleased with her progress and says she feels so much better.   Plan: Continue current habit of tracking dietary intake. Follow up with me in 1 month.   [name], RN, BA, [place], [place] Direct Dial:  [phone]  Fax:  [phone] E-mail: [email or web address] [address] [place], [place]  [address]

## 2014-11-28 ENCOUNTER — Other Ambulatory Visit: Payer: Self-pay

## 2014-11-28 NOTE — Patient Outreach (Signed)
Spoke with Alyssa Schultz today to check on her progress with her diabetes. Fasting blood sugars have been 94-98mg /dl most of the time except once this week.  She didn't eat enough carbs in the evening and then her blood was 112mg /dl.  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   [name], RN, BA, MHA, CDE Diabetes Coordinator Inpatient Diabetes Program  [phone] (Team Pager) [phone] [facility]) 11/28/2014 12:40 PM

## 2014-12-18 ENCOUNTER — Ambulatory Visit: Payer: 59

## 2014-12-19 ENCOUNTER — Encounter: Payer: Self-pay | Admitting: Internal Medicine

## 2015-01-06 ENCOUNTER — Ambulatory Visit
Admission: EM | Admit: 2015-01-06 | Discharge: 2015-01-06 | Disposition: A | Discharge status: Home / Self Care | Payer: 59 | Attending: Family Medicine | Admitting: Family Medicine

## 2015-01-06 DIAGNOSIS — R42 Dizziness and giddiness: Secondary | ICD-10-CM

## 2015-01-06 MED ORDER — MECLIZINE HCL 50 MG PO TABS: 50.0000 mg | ORAL_TABLET | Freq: Three times a day (TID) | ORAL | Status: DC | PRN

## 2015-01-06 NOTE — ED Provider Notes (Signed)
CSN: [id number]     Arrival date & time 01/06/15  0806 History   First MD Initiated Contact with Patient 01/06/15 0901     Chief Complaint  Patient presents with  . Dizziness  . Otalgia  . Shoulder Pain   (Consider location/radiation/quality/duration/timing/severity/associated sxs/prior Treatment) HPI  49 year old female who presents with the sudden onset of dizziness approximately 3 days ago associated with a left earache. 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 She has seen Dr. Jerral Bonito in the past. Usually she has an associated ear infection and is usually treated with some antibiotics as well. She ran a fever last night of 100.3 and today is 99.6. She states that any turning of her head makes the room spin. She has no associated nausea or vomiting. She has had a slightly nonproductive cough.  Past Medical History  Diagnosis Date  . Diabetes mellitus   . Hypertension   . Hyperlipidemia    Past Surgical History  Procedure Laterality Date  . Facial reconstruction surgery     Family History  Problem Relation Age of Onset  . Diabetes Mother    History  Substance Use Topics  . Smoking status: Never Smoker   . Smokeless tobacco: Not on file  . Alcohol Use: Yes     Comment: Occasional wine   OB History    No data available     Review of Systems  HENT: Positive for congestion and ear pain. Negative for hearing loss, rhinorrhea and tinnitus.   Neurological: Positive for dizziness. Negative for tremors, seizures, syncope, facial asymmetry, speech difficulty, weakness, light-headedness, numbness and headaches.  All other systems reviewed and are negative.   Allergies  Influenza vaccine live  Home Medications   Prior to Admission medications   Medication Sig Start Date End Date Taking? Authorizing Provider  B-D ULTRA-FINE 33 LANCETS MISC Use as directed 09/17/14   Jackolyn Confer, MD  fluconazole (DIFLUCAN) 150 MG tablet Take 1 tablet (150  mg total) by mouth daily. 09/12/14   Jackolyn Confer, MD  glucose blood test strip Patient is testing at home 1-2 times daily. 02/23/13   Jackolyn Confer, MD  hydrochlorothiazide (HYDRODIURIL) 25 MG tablet Take 1 tablet (25 mg total) by mouth daily. 08/24/14   Jackolyn Confer, MD  lisinopril (PRINIVIL,ZESTRIL) 10 MG tablet TAKE ONE TABLET BY MOUTH DAILY 10/10/14   Jackolyn Confer, MD  lubiprostone (AMITIZA) 24 MCG capsule Take 1 capsule (24 mcg total) by mouth daily with breakfast. 10/31/14   Jackolyn Confer, MD  meloxicam (MOBIC) 15 MG tablet Take 1 tablet (15 mg total) by mouth daily. 09/24/14   Max T Hyatt, DPM  metFORMIN (GLUCOPHAGE) 500 MG tablet Take 1 tablet (500 mg total) by mouth 2 (two) times daily with a meal. 09/13/14   Jackolyn Confer, MD  metroNIDAZOLE (FLAGYL) 500 MG tablet Take 1 tablet (500 mg total) by mouth 2 (two) times daily. 11/07/14   Jackolyn Confer, MD  nystatin (MYCOSTATIN) powder Apply topically 4 (four) times daily. 05/20/12   Jackolyn Confer, MD  saxagliptin HCl (ONGLYZA) 2.5 MG TABS tablet Take 1 tablet (2.5 mg total) by mouth daily. 09/12/14   Jackolyn Confer, MD   BP 126/83 mmHg  Pulse 84  Temp(Src) 99.6 F (37.6 C) (Tympanic)  Ht 5\' 5"  (1.651 m)  Wt 183 lb (83.008 kg)  BMI 30.45 kg/m2  SpO2 97% Physical Exam  Constitutional: She is oriented to  person, place, and time. She appears well-developed and well-nourished.  HENT:  Head: Normocephalic and atraumatic.  Right Ear: External ear normal.  Left Ear: External ear normal.  Mouth/Throat: Oropharynx is clear and moist.  Eyes: EOM are normal. Pupils are equal, round, and reactive to light.  The patient demonstrates that 2-3 beat horizontal nystagmus is no vertical denies tightness component. Pupils are equal react to light.  Neck: Normal range of motion. Neck supple.  Cardiovascular: Normal rate, regular rhythm and normal heart sounds.  Exam reveals no gallop and no friction rub.   No murmur  heard. Pulmonary/Chest: Effort normal and breath sounds normal. No respiratory distress. She has no wheezes. She has no rales. She exhibits no tenderness.  Musculoskeletal: Normal range of motion. She exhibits no edema.  Lymphadenopathy:    She has no cervical adenopathy.  Neurological: She is alert and oriented to person, place, and time. She has normal reflexes. She displays normal reflexes. No cranial nerve deficit. Coordination normal.  Skin: Skin is warm and dry.  Psychiatric: She has a normal mood and affect. Her behavior is normal. Judgment and thought content normal.    ED Course  Procedures (including critical care time) Labs Review Labs Reviewed - No data to display  Imaging Review No results found.   MDM   1. Vertigo    Discharge Medication List as of 01/06/2015  9:27 AM      I discussed my findings with the patient and told her that the vertigo probably is caused by a virus which is in turn causing her fever and upper respiratory symptoms. She states that the meclizine usually helps her feel much better within a day. Metoprolol will not place her on any antibiotics at this time since I feel is viral in nature. I recommended that she may consider seeing ENT if her vertigo persists. A note was written for work tomorrow as she starts her taking her meclizine. May follow-up with [place] here or with her primary care physician if she continues to have problems.  Lorin Picket, PA-C 01/06/15 0930  Lorin Picket, PA-C 01/06/15 [phone]

## 2015-01-06 NOTE — Discharge Instructions (Signed)
Benign Positional Vertigo Vertigo means you feel like you or your surroundings are moving when they are not. Benign positional vertigo is the most common form of vertigo. Benign means that the cause of your condition is not serious. Benign positional vertigo is more common in older adults. CAUSES  Benign positional vertigo is the result of an upset in the labyrinth system. This is an area in the middle ear that helps control your balance. This may be caused by a viral infection, head injury, or repetitive motion. However, often no specific cause is found. SYMPTOMS  Symptoms of benign positional vertigo occur when you move your head or eyes in different directions. Some of the symptoms may include:  Loss of balance and falls.  Vomiting.  Blurred vision.  Dizziness.  Nausea.  Involuntary eye movements (nystagmus). DIAGNOSIS  Benign positional vertigo is usually diagnosed by physical exam. If the specific cause of your benign positional vertigo is unknown, your caregiver may perform imaging tests, such as magnetic resonance imaging (MRI) or computed tomography (CT). TREATMENT  Your caregiver may recommend movements or procedures to correct the benign positional vertigo. Medicines such as meclizine, benzodiazepines, and medicines for nausea may be used to treat your symptoms. In rare cases, if your symptoms are caused by certain conditions that affect the inner ear, you may need surgery. HOME CARE INSTRUCTIONS   Follow your caregiver's instructions.  Move slowly. Do not make sudden body or head movements.  Avoid driving.  Avoid operating heavy machinery.  Avoid performing any tasks that would be dangerous to you or others during a vertigo episode.  Drink enough fluids to keep your urine clear or pale yellow. SEEK IMMEDIATE MEDICAL CARE IF:   You develop problems with walking, weakness, numbness, or using your arms, hands, or legs.  You have difficulty speaking.  You develop  severe headaches.  Your nausea or vomiting continues or gets worse.  You develop visual changes.  Your family or friends notice any behavioral changes.  Your condition gets worse.  You have a fever.  You develop a stiff neck or sensitivity to light. MAKE SURE YOU:   Understand these instructions.  Will watch your condition.  Will get help right away if you are not doing well or get worse. Document Released: 05/11/2006 Document Revised: 10/26/2011 Document Reviewed: 04/23/2011 ExitCare Patient Information 2015 [employer]. This information is not intended to replace advice given to you by your health care provider. Make sure you discuss any questions you have with your health care provider.    

## 2015-01-06 NOTE — ED Notes (Signed)
Patient reports feeling dizzy Thursday, was also feeling sick with cough. Then started having earache last night. Shoulder pain for the past 2 weeks ache left side.

## 2015-01-09 ENCOUNTER — Ambulatory Visit (INDEPENDENT_AMBULATORY_CARE_PROVIDER_SITE_OTHER): Payer: 59 | Admitting: Internal Medicine

## 2015-01-09 ENCOUNTER — Encounter: Payer: Self-pay | Admitting: Internal Medicine

## 2015-01-09 VITALS — BP 100/62 | HR 92 | Temp 98.0°F | Resp 16 | Ht 66.0 in | Wt 179.1 lb

## 2015-01-09 DIAGNOSIS — R5381 Other malaise: Secondary | ICD-10-CM | POA: Insufficient documentation

## 2015-01-09 LAB — COMPREHENSIVE METABOLIC PANEL
ALT: 11 U/L (ref 0–35)
AST: 16 U/L (ref 0–37)
Albumin: 4.5 g/dL (ref 3.5–5.2)
Alkaline Phosphatase: 40 U/L (ref 39–117)
BUN: 21 mg/dL (ref 6–23)
CHLORIDE: 96 meq/L (ref 96–112)
CO2: 28 meq/L (ref 19–32)
CREATININE: 0.99 mg/dL (ref 0.40–1.20)
Calcium: 9.2 mg/dL (ref 8.4–10.5)
GFR: 76.2 mL/min (ref >60.00)
Glucose, Bld: 122 mg/dL — ABNORMAL HIGH (ref 70–99)
Potassium: 3.8 mEq/L (ref 3.5–5.1)
Sodium: 135 mEq/L (ref 135–145)
Total Bilirubin: 0.6 mg/dL (ref 0.2–1.2)
Total Protein: 7.9 g/dL (ref 6.0–8.3)

## 2015-01-09 LAB — CBC
HEMATOCRIT: 43.4 % (ref 36.0–46.0)
Hemoglobin: 14.6 g/dL (ref 12.0–15.0)
MCHC: 33.5 g/dL (ref 30.0–36.0)
MCV: 84.3 fl (ref 78.0–100.0)
Platelets: 263 10*3/uL (ref 150.0–400.0)
RBC: 5.15 Mil/uL — AB (ref 3.87–5.11)
RDW: 14.2 % (ref 11.5–15.5)
WBC: 10.1 10*3/uL (ref 4.0–10.5)

## 2015-01-09 LAB — TSH: TSH: 3.57 u[IU]/mL (ref 0.35–4.50)

## 2015-01-09 NOTE — Patient Instructions (Signed)
Continue Doxycycline twice daily.  Continue to push fluid intake.   Labs today.  Follow up next week.

## 2015-01-09 NOTE — Assessment & Plan Note (Signed)
Recent generalized malaise, fatigue, fever, headache, dizziness most consistent with viral infection versus RMSF/Ehrlichia. Will check CBC, TSH, CMP, RMSF titer. Encouraged fluid intake, Meclizine for dizziness prn.  Follow up in 1 week and prn.

## 2015-01-09 NOTE — Progress Notes (Signed)
Subjective:    Patient ID: Alyssa Schultz, female    DOB: 1965-12-10, 49 y.o.   MRN: [id number]  HPI  48YO female presents for acute visit.  Last Thursday developed cough. Sunday went to urgent care, told she had a virus. Had fever all weekend 102F. Had headache with this. Rash noted on left arm about 2 weeks ago, but nothing current.  Monday went to [facility]. Started Doxycycline. Saw a tick on her leg last week, but did not latch on. Some nausea but no vomiting. Tolerating liquids.  DM - Trying to follow a healthy diet. Walking after work. BG have been well controlled. No low BG less than 70.  Wt Readings from Last 3 Encounters:  01/09/15 179 lb 1.9 oz (81.248 kg)  01/06/15 183 lb (83.008 kg)  10/15/14 189 lb 8 oz (85.957 kg)     Past medical, surgical, family and social history per today's encounter.  Review of Systems  Constitutional: Positive for fever, chills and fatigue. Negative for appetite change and unexpected weight change.  HENT: Negative for congestion, postnasal drip, rhinorrhea, sinus pressure, sneezing, sore throat and voice change.   Eyes: Negative for visual disturbance.  Respiratory: Negative for shortness of breath.   Cardiovascular: Negative for chest pain and leg swelling.  Gastrointestinal: Positive for nausea. Negative for vomiting, abdominal pain, diarrhea and constipation.  Musculoskeletal: Negative for myalgias and arthralgias.  Skin: Positive for rash. Negative for color change.  Neurological: Positive for dizziness and headaches. Negative for light-headedness.  Hematological: Negative for adenopathy. Does not bruise/bleed easily.  Psychiatric/Behavioral: Negative for sleep disturbance and dysphoric mood. The patient is not nervous/anxious.        Objective:    BP 100/62 mmHg  Pulse 92  Temp(Src) 98 F (36.7 C)  Resp 16  Ht 5\' 6"  (1.676 m)  Wt 179 lb 1.9 oz (81.248 kg)  BMI 28.92 kg/m2  SpO2 96% Physical Exam    Constitutional: She is oriented to person, place, and time. She appears well-developed and well-nourished. No distress.  HENT:  Head: Normocephalic and atraumatic.  Right Ear: External ear normal.  Left Ear: External ear normal.  Nose: Nose normal.  Mouth/Throat: Oropharynx is clear and moist. No oropharyngeal exudate.  Eyes: Conjunctivae are normal. Pupils are equal, round, and reactive to light. Right eye exhibits no discharge. Left eye exhibits no discharge. No scleral icterus.  Neck: Normal range of motion. Neck supple. No tracheal deviation present. No thyromegaly present.  Cardiovascular: Normal rate, regular rhythm, normal heart sounds and intact distal pulses.  Exam reveals no gallop and no friction rub.   No murmur heard. Pulmonary/Chest: Effort normal and breath sounds normal. No accessory muscle usage. No respiratory distress. She has no decreased breath sounds. She has no wheezes. She has no rhonchi. She has no rales. She exhibits no tenderness.  Musculoskeletal: Normal range of motion. She exhibits no edema or tenderness.  Lymphadenopathy:    She has no cervical adenopathy.  Neurological: She is alert and oriented to person, place, and time. No cranial nerve deficit. She exhibits normal muscle tone. Coordination normal.  Skin: Skin is warm and dry. No rash noted. She is not diaphoretic. No erythema. No pallor.  Psychiatric: She has a normal mood and affect. Her behavior is normal. Judgment and thought content normal.          Assessment & Plan:   Problem List Items Addressed This Visit      Unprioritized   Malaise -  Primary    Recent generalized malaise, fatigue, fever, headache, dizziness most consistent with viral infection versus RMSF/Ehrlichia. Will check CBC, TSH, CMP, RMSF titer. Encouraged fluid intake, Meclizine for dizziness prn.  Follow up in 1 week and prn.      Relevant Orders   Rocky mtn spotted fvr abs pnl(IgG+IgM)   Comprehensive metabolic panel   TSH    CBC       Return in about 1 week (around 01/16/2015).

## 2015-01-10 ENCOUNTER — Other Ambulatory Visit: Payer: 59

## 2015-01-10 LAB — ROCKY MTN SPOTTED FVR ABS PNL(IGG+IGM)
RMSF IGG: 1.1 IV — AB
RMSF IgM: 0.18 IV

## 2015-01-11 ENCOUNTER — Encounter: Payer: Self-pay | Admitting: Internal Medicine

## 2015-01-16 ENCOUNTER — Encounter: Payer: Self-pay | Admitting: Internal Medicine

## 2015-01-16 ENCOUNTER — Ambulatory Visit (INDEPENDENT_AMBULATORY_CARE_PROVIDER_SITE_OTHER): Payer: 59 | Admitting: Internal Medicine

## 2015-01-16 VITALS — BP 97/66 | HR 76 | Temp 98.3°F | Ht 66.0 in | Wt 183.4 lb

## 2015-01-16 DIAGNOSIS — Z124 Encounter for screening for malignant neoplasm of cervix: Secondary | ICD-10-CM | POA: Diagnosis not present

## 2015-01-16 DIAGNOSIS — I10 Essential (primary) hypertension: Secondary | ICD-10-CM | POA: Diagnosis not present

## 2015-01-16 DIAGNOSIS — R5381 Other malaise: Secondary | ICD-10-CM | POA: Diagnosis not present

## 2015-01-16 DIAGNOSIS — E119 Type 2 diabetes mellitus without complications: Secondary | ICD-10-CM

## 2015-01-16 LAB — HEMOGLOBIN A1C: HEMOGLOBIN A1C: 6.1 % (ref 4.6–6.5)

## 2015-01-16 LAB — COMPREHENSIVE METABOLIC PANEL
ALBUMIN: 4.2 g/dL (ref 3.5–5.2)
ALT: 15 U/L (ref 0–35)
AST: 15 U/L (ref 0–37)
Alkaline Phosphatase: 39 U/L (ref 39–117)
BUN: 11 mg/dL (ref 6–23)
CO2: 32 mEq/L (ref 19–32)
Calcium: 9.6 mg/dL (ref 8.4–10.5)
Chloride: 101 mEq/L (ref 96–112)
Creatinine, Ser: 0.77 mg/dL (ref 0.40–1.20)
GFR: 101.83 mL/min (ref >60.00)
Glucose, Bld: 133 mg/dL — ABNORMAL HIGH (ref 70–99)
POTASSIUM: 3.8 meq/L (ref 3.5–5.1)
SODIUM: 139 meq/L (ref 135–145)
TOTAL PROTEIN: 7.5 g/dL (ref 6.0–8.3)
Total Bilirubin: 0.4 mg/dL (ref 0.2–1.2)

## 2015-01-16 LAB — LIPID PANEL
CHOL/HDL RATIO: 4
Cholesterol: 213 mg/dL — ABNORMAL HIGH (ref 0–200)
HDL: 49 mg/dL (ref >39.00)
LDL Cholesterol: 143 mg/dL — ABNORMAL HIGH (ref 0–99)
NonHDL: 164
TRIGLYCERIDES: 105 mg/dL (ref 0.0–149.0)
VLDL: 21 mg/dL (ref 0.0–40.0)

## 2015-01-16 NOTE — Assessment & Plan Note (Signed)
BG well controlled by report. Will check A1c with labs. Continue Onglyza and Metformin.

## 2015-01-16 NOTE — Assessment & Plan Note (Signed)
Symptoms have resolved. Suspect viral infection. Will monitor for any recurrent symptoms.

## 2015-01-16 NOTE — Assessment & Plan Note (Signed)
BP Readings from Last 3 Encounters:  01/16/15 97/66  01/09/15 100/62  01/06/15 126/83   BP well controlled. Will check renal function with labs.

## 2015-01-16 NOTE — Progress Notes (Signed)
Pre visit review using our clinic review tool, if applicable. No additional management support is needed unless otherwise documented below in the visit note. 

## 2015-01-16 NOTE — Progress Notes (Signed)
Subjective:    Patient ID: Alyssa Schultz, female    DOB: Aug 25, 1965, 49 y.o.   MRN: [id number]  HPI [name] presents for follow up.  Last seen 5/25 with generalized malaise, headache. Suspected RMSF. Started on Doxycycline. Titer for RMSF was negative.  Feeling better. No further headache, fever.  DM - BG 80-130. Tolerating medication well with no side effects. Compliant with medication.   Past medical, surgical, family and social history per today's encounter.  Review of Systems  Constitutional: Negative for fever, chills, appetite change, fatigue and unexpected weight change.  Eyes: Negative for visual disturbance.  Respiratory: Negative for shortness of breath.   Cardiovascular: Negative for chest pain and leg swelling.  Gastrointestinal: Negative for nausea, vomiting, abdominal pain, diarrhea and constipation.  Musculoskeletal: Negative for myalgias and arthralgias.  Skin: Negative for color change and rash.  Hematological: Negative for adenopathy. Does not bruise/bleed easily.  Psychiatric/Behavioral: Negative for sleep disturbance and dysphoric mood. The patient is not nervous/anxious.        Objective:    BP 97/66 mmHg  Pulse 76  Temp(Src) 98.3 F (36.8 C) (Oral)  Ht 5\' 6"  (1.676 m)  Wt 183 lb 6 oz (83.178 kg)  BMI 29.61 kg/m2  SpO2 98% Physical Exam  Constitutional: She is oriented to person, place, and time. She appears well-developed and well-nourished. No distress.  HENT:  Head: Normocephalic and atraumatic.  Right Ear: External ear normal.  Left Ear: External ear normal.  Nose: Nose normal.  Mouth/Throat: Oropharynx is clear and moist. No oropharyngeal exudate.  Eyes: Conjunctivae are normal. Pupils are equal, round, and reactive to light. Right eye exhibits no discharge. Left eye exhibits no discharge. No scleral icterus.  Neck: Normal range of motion. Neck supple. No tracheal deviation present. No thyromegaly present.  Cardiovascular: Normal  rate, regular rhythm, normal heart sounds and intact distal pulses.  Exam reveals no gallop and no friction rub.   No murmur heard. Pulmonary/Chest: Effort normal and breath sounds normal. No respiratory distress. She has no wheezes. She has no rales. She exhibits no tenderness.  Musculoskeletal: Normal range of motion. She exhibits no edema or tenderness.  Lymphadenopathy:    She has no cervical adenopathy.  Neurological: She is alert and oriented to person, place, and time. No cranial nerve deficit. She exhibits normal muscle tone. Coordination normal.  Skin: Skin is warm and dry. No rash noted. She is not diaphoretic. No erythema. No pallor.  Psychiatric: She has a normal mood and affect. Her behavior is normal. Judgment and thought content normal.          Assessment & Plan:   Problem List Items Addressed This Visit      Unprioritized   Diabetes mellitus type 2, controlled - Primary    BG well controlled by report. Will check A1c with labs. Continue Onglyza and Metformin.      Relevant Orders   Comprehensive metabolic panel   Hemoglobin A1c   Lipid panel   Microalbumin / creatinine urine ratio   Hypertension    BP Readings from Last 3 Encounters:  01/16/15 97/66  01/09/15 100/62  01/06/15 126/83   BP well controlled. Will check renal function with labs.      Malaise    Symptoms have resolved. Suspect viral infection. Will monitor for any recurrent symptoms.       Other Visit Diagnoses    Cervical cancer screening        Relevant Orders    Ambulatory  referral to Gynecology        Return in about 3 months (around 04/18/2015) for Recheck of Diabetes.

## 2015-01-16 NOTE — Patient Instructions (Signed)
Labs today

## 2015-02-05 NOTE — Patient Outreach (Signed)
[place] [facility]) Care Management  02/05/2015  Alyssa Schultz Dec 08, 1965 [id number]   Alyssa Schultz e-mailed me to share her most recent A1C results6.1%;  down from 9.6% in February.  She is very pleased.  Her weight has also dropped from 196.8 to her current 183.4lbs- she would like to lose 15 lbs more. MD wants her to continue taking Glucophage.  Alyssa Schultz is exercising 30 minutes per day 5/7 days a week for at least 30 minutes.   Alyssa Fitz, RN, BA, [place], [place] Direct Dial:  [phone]  Fax:  [phone] E-mail: [email or web address] [address], [place], [place]  [address]

## 2015-02-11 ENCOUNTER — Other Ambulatory Visit: Payer: Self-pay

## 2015-02-19 ENCOUNTER — Encounter: Payer: Self-pay | Admitting: Internal Medicine

## 2015-02-19 ENCOUNTER — Encounter: Payer: Self-pay | Admitting: *Deleted

## 2015-03-11 ENCOUNTER — Other Ambulatory Visit: Payer: Self-pay | Admitting: Internal Medicine

## 2015-03-11 ENCOUNTER — Other Ambulatory Visit: Payer: Self-pay

## 2015-03-11 NOTE — Patient Outreach (Signed)
[facility]) Care Management  03/11/2015  Alyssa Schultz 1965/10/23 [id number]  [name] to check on blood sugars.  I saw her a couple of weeks ago and she indicated her fasting blood sugars were elevated and she thought it was because she wasn't exercising. She reports to me today that in fact her blood sugars remain elevated and she has not started to exercise.  She does not see MD until September 2016.  i will schedule to see her in August.    [name], RN, BA, [place], [place]:  [phone]  Fax:  [phone] E-mail: [email or web address] [address] [place], [place]  [address]

## 2015-04-08 ENCOUNTER — Other Ambulatory Visit: Payer: Self-pay | Admitting: Internal Medicine

## 2015-04-15 ENCOUNTER — Ambulatory Visit: Payer: 59

## 2015-04-23 ENCOUNTER — Other Ambulatory Visit: Payer: Self-pay | Admitting: Internal Medicine

## 2015-04-26 ENCOUNTER — Ambulatory Visit: Payer: Self-pay | Admitting: Internal Medicine

## 2015-05-15 ENCOUNTER — Other Ambulatory Visit: Payer: Self-pay

## 2015-05-15 ENCOUNTER — Ambulatory Visit: Payer: 59

## 2015-05-15 NOTE — Patient Outreach (Signed)
[facility]) Care Management  05/15/2015  Alyssa Schultz August 16, 1966 [id number]   Patient did not show for scheduled visit.    [name], RN, BA, [place], [place] Direct Dial:  [phone]  Fax:  [phone] E-mail: [email or web address] [address] [place], [place]  [address]

## 2015-05-15 NOTE — Patient Outreach (Signed)
[place]) Care Management  05/15/2015  Alyssa Schultz 1965/10/24 [id number]  I sent an e-mail to [name] about today's missed appointment. I have requested she call me to schedule for after her appointment with Dr. Gilford Rile on 05/24/15.    [name], RN, BA, [name], [name] Direct Dial:  [phone]  Fax:  [phone] E-mail: [email or web address] [address] [place],   [address]

## 2015-05-21 ENCOUNTER — Encounter: Payer: Self-pay | Admitting: Internal Medicine

## 2015-05-24 ENCOUNTER — Ambulatory Visit: Payer: 59 | Admitting: Internal Medicine

## 2015-05-27 ENCOUNTER — Other Ambulatory Visit: Payer: Self-pay

## 2015-05-27 NOTE — Patient Outreach (Signed)
[place] [facility]) Care Management  05/27/2015  Alyssa Schultz 07-22-1966 [id number]   Patient did not show for today's scheduled appointment.    [name], RN, BA, [place], [place] Direct Dial:  [phone]  Fax:  [phone] E-mail: [email or web address] [address], [place], [place]  [address]

## 2015-05-27 NOTE — Patient Outreach (Signed)
[place] [facility]) Care Management  05/27/2015  Alyssa Schultz 01/30/1966 [id number]  [name] to get an update- she was unable to speak and this time. I have asked her to return my call.   [name], RN, BA, [place], [place] Direct Dial:  [phone]  Fax:  [phone] E-mail: [name] [address], [place], [place]  [address]

## 2015-05-30 ENCOUNTER — Encounter: Payer: Self-pay | Admitting: Physician Assistant

## 2015-05-30 ENCOUNTER — Ambulatory Visit: Payer: Self-pay | Admitting: Physician Assistant

## 2015-05-30 VITALS — BP 129/80 | HR 72 | Temp 97.8°F

## 2015-05-30 LAB — POCT URINALYSIS DIPSTICK
BILIRUBIN UA: NEGATIVE
Blood, UA: NEGATIVE
GLUCOSE UA: NEGATIVE
KETONES UA: NEGATIVE
LEUKOCYTES UA: NEGATIVE
NITRITE UA: NEGATIVE
PH UA: 5.5
Protein, UA: NEGATIVE
Spec Grav, UA: 1.02
Urobilinogen, UA: 0.2

## 2015-05-30 NOTE — Progress Notes (Signed)
S:  C/o ears popping and feels light headed, ? If has uti, felt this way last time right before she had a uti, no drainage from ears, no fever/chills, no cough or congestion, some sinus pressure, remainder ros neg Using otc meds without relief  O:  Vitals wnl, nad, perrl eomi tms dull b/l, nasal mucosa wnl, throat wnl, neck supple no lymph, lungs c t a, cv rrr, neuro intact  A: acute vertigo  P: drink plenty of fluids, pt has antivert at home to use, return if not improving in 3 to 5 days, return earlier if worsening

## 2015-06-11 ENCOUNTER — Encounter: Payer: Self-pay | Admitting: Internal Medicine

## 2015-06-11 ENCOUNTER — Other Ambulatory Visit: Payer: Self-pay | Admitting: Internal Medicine

## 2015-06-11 DIAGNOSIS — Z1231 Encounter for screening mammogram for malignant neoplasm of breast: Secondary | ICD-10-CM

## 2015-06-14 ENCOUNTER — Emergency Department: Payer: 59

## 2015-06-14 ENCOUNTER — Emergency Department
Admission: EM | Admit: 2015-06-14 | Discharge: 2015-06-14 | Disposition: A | Discharge status: Home / Self Care | Payer: 59 | Attending: Emergency Medicine | Admitting: Emergency Medicine

## 2015-06-14 ENCOUNTER — Encounter: Payer: Self-pay | Admitting: Emergency Medicine

## 2015-06-14 DIAGNOSIS — Z79899 Other long term (current) drug therapy: Secondary | ICD-10-CM | POA: Diagnosis not present

## 2015-06-14 DIAGNOSIS — R911 Solitary pulmonary nodule: Secondary | ICD-10-CM | POA: Diagnosis not present

## 2015-06-14 DIAGNOSIS — I10 Essential (primary) hypertension: Secondary | ICD-10-CM | POA: Diagnosis not present

## 2015-06-14 DIAGNOSIS — Z792 Long term (current) use of antibiotics: Secondary | ICD-10-CM | POA: Diagnosis not present

## 2015-06-14 DIAGNOSIS — Z3202 Encounter for pregnancy test, result negative: Secondary | ICD-10-CM | POA: Diagnosis not present

## 2015-06-14 DIAGNOSIS — E119 Type 2 diabetes mellitus without complications: Secondary | ICD-10-CM | POA: Insufficient documentation

## 2015-06-14 DIAGNOSIS — Z7984 Long term (current) use of oral hypoglycemic drugs: Secondary | ICD-10-CM | POA: Insufficient documentation

## 2015-06-14 DIAGNOSIS — R079 Chest pain, unspecified: Secondary | ICD-10-CM | POA: Diagnosis not present

## 2015-06-14 LAB — CBC
HEMATOCRIT: 39.9 % (ref 35.0–47.0)
HEMOGLOBIN: 13.3 g/dL (ref 12.0–16.0)
MCH: 28.4 pg (ref 26.0–34.0)
MCHC: 33.4 g/dL (ref 32.0–36.0)
MCV: 85.2 fL (ref 80.0–100.0)
Platelets: 244 10*3/uL (ref 150–440)
RBC: 4.68 MIL/uL (ref 3.80–5.20)
RDW: 14.7 % — ABNORMAL HIGH (ref 11.5–14.5)
WBC: 8.7 10*3/uL (ref 3.6–11.0)

## 2015-06-14 LAB — BASIC METABOLIC PANEL
ANION GAP: 10 (ref 5–15)
BUN: 13 mg/dL (ref 6–20)
CALCIUM: 9.3 mg/dL (ref 8.9–10.3)
CO2: 28 mmol/L (ref 22–32)
Chloride: 101 mmol/L (ref 101–111)
Creatinine, Ser: 0.63 mg/dL (ref 0.44–1.00)
GFR calc non Af Amer: >60 mL/min (ref >60)
GLUCOSE: 151 mg/dL — AB (ref 65–99)
POTASSIUM: 3.8 mmol/L (ref 3.5–5.1)
Sodium: 139 mmol/L (ref 135–145)

## 2015-06-14 LAB — FIBRIN DERIVATIVES D-DIMER (ARMC ONLY): FIBRIN DERIVATIVES D-DIMER (ARMC): 771 — AB (ref 0–499)

## 2015-06-14 LAB — TROPONIN I: Troponin I: <0.03 ng/mL (ref <0.031)

## 2015-06-14 LAB — POCT PREGNANCY, URINE: PREG TEST UR: NEGATIVE

## 2015-06-14 MED ORDER — IOHEXOL 350 MG/ML SOLN
100.0000 mL | Freq: Once | INTRAVENOUS | Status: AC | PRN
Start: 2015-06-14 — End: 2015-06-14
  Administered 2015-06-14: 100 mL via INTRAVENOUS

## 2015-06-14 MED ORDER — SODIUM CHLORIDE 0.9 % IV BOLUS (SEPSIS)
1000.0000 mL | Freq: Once | INTRAVENOUS | Status: AC
  Administered 2015-06-14: 1000 mL via INTRAVENOUS

## 2015-06-14 NOTE — ED Provider Notes (Signed)
[facility] Emergency Department Provider Note  ____________________________________________  Time seen: Approximately 430 PM  I have reviewed the triage vital signs and the nursing notes.   HISTORY  Chief Complaint Chest Pain    HPI Alyssa Schultz is a 49 y.o. female with a history of hypertension and diabetes who is presenting today with 3 days of chest pain. She says that the chest pain has been constant but worsened at 11 AM this morning. She says it hurts to breathe and also to turn her head to the left. She says it feels as if something is pulling in her chest. She denies any heavy lifting or bending. Says that the pain feels better when she gets up and walks around. The patient says that she does a lot of brisk walking and took her last walk 2 days ago and had no worsening of her chest pain or shortness of breath at this time. She denies any nausea or vomiting. Denies any previous MI or history of MI in her family.   Past Medical History  Diagnosis Date  . Diabetes mellitus   . Hypertension   . Hyperlipidemia     Patient Active Problem List   Diagnosis Date Noted  . Malaise 01/09/2015  . Metatarsal fracture 09/12/2014  . Allergic rhinitis 10/14/2012  . Hypertension 11/09/2011  . Diabetes mellitus type 2, controlled ([place]) 05/21/2011  . BMI 30.0-30.9,adult 05/21/2011    Past Surgical History  Procedure Laterality Date  . Facial reconstruction surgery    . Ablation      Current Outpatient Rx  Name  Route  Sig  Dispense  Refill  . B-D ULTRA-FINE 33 LANCETS MISC      Use as directed   100 each   5   . glucose blood test strip      Patient is testing at home 1-2 times daily.   100 each   12     Please dispense testing strips for patient to use  ...   . hydrochlorothiazide (HYDRODIURIL) 25 MG tablet   Oral   Take 1 tablet (25 mg total) by mouth daily.   90 tablet   3   . lisinopril (PRINIVIL,ZESTRIL) 10 MG tablet      TAKE  ONE TABLET BY MOUTH DAILY   90 tablet   1   . lubiprostone (AMITIZA) 24 MCG capsule   Oral   Take 1 capsule (24 mcg total) by mouth daily with breakfast.   30 capsule   3   . meclizine (ANTIVERT) 50 MG tablet   Oral   Take 1 tablet (50 mg total) by mouth 3 (three) times daily as needed for dizziness.   30 tablet   1   . metFORMIN (GLUCOPHAGE) 500 MG tablet      TAKE 1 TABLET BY MOUTH 2 TIMES DAILY WITH A MEAL.   60 tablet   5   . metroNIDAZOLE (FLAGYL) 500 MG tablet   Oral   Take 1 tablet (500 mg total) by mouth 2 (two) times daily.   14 tablet   0   . ONGLYZA 2.5 MG TABS tablet      TAKE 1 TABLET (2.5 MG TOTAL) BY MOUTH DAILY.   30 tablet   6   . fluconazole (DIFLUCAN) 100 MG tablet   Oral   Take 100 mg by mouth daily as needed.       2     Allergies Influenza vaccine live  Family History  Problem Relation Age of Onset  . Diabetes Mother     Social History Social History  Substance Use Topics  . Smoking status: Never Smoker   . Smokeless tobacco: None  . Alcohol Use: Yes     Comment: Occasional wine    Review of Systems Constitutional: No fever/chills Eyes: No visual changes. ENT: No sore throat. Cardiovascular: As above  Respiratory: Denies shortness of breath. Gastrointestinal: No abdominal pain.  No nausea, no vomiting.  No diarrhea.  No constipation. Genitourinary: Negative for dysuria. Musculoskeletal: Negative for back pain. Skin: Negative for rash. Neurological: Negative for headaches, focal weakness or numbness.  10-point ROS otherwise negative.  ____________________________________________   PHYSICAL EXAM:  VITAL SIGNS: ED Triage Vitals  Enc Vitals Group     BP 06/14/15 1249 143/80 mmHg     Pulse Rate 06/14/15 1249 79     Resp 06/14/15 1249 20     Temp 06/14/15 1249 98.4 F (36.9 C)     Temp Source 06/14/15 1249 Oral     SpO2 06/14/15 1249 99 %     Weight 06/14/15 1249 189 lb (85.73 kg)     Height 06/14/15 1249 5' 5.5"  (1.664 m)     Head Cir --      Peak Flow --      Pain Score 06/14/15 1249 5     Pain Loc --      Pain Edu? --      Excl. in [place]? --     Constitutional: Alert and oriented. Well appearing and in no acute distress. Eyes: Conjunctivae are normal. PERRL. EOMI. Head: Atraumatic. Nose: No congestion/rhinnorhea. Mouth/Throat: Mucous membranes are moist.  Oropharynx non-erythematous. Neck: No stridor.   Cardiovascular: Normal rate, regular rhythm. Grossly normal heart sounds.  Good peripheral circulation. Chest pain is reproducible to palpation. Respiratory: Normal respiratory effort.  No retractions. Lungs CTAB. Gastrointestinal: Soft and nontender. No distention. No abdominal bruits. No CVA tenderness. Musculoskeletal: No lower extremity tenderness nor edema.  No joint effusions. Neurologic:  Normal speech and language. No gross focal neurologic deficits are appreciated. No gait instability. Skin:  Skin is warm, dry and intact. No rash noted. Psychiatric: Mood and affect are normal. Speech and behavior are normal.  ____________________________________________   LABS (all labs ordered are listed, but only abnormal results are displayed)  Labs Reviewed  BASIC METABOLIC PANEL - Abnormal; Notable for the following:    Glucose, Bld 151 (*)    All other components within normal limits  CBC - Abnormal; Notable for the following:    RDW 14.7 (*)    All other components within normal limits  FIBRIN DERIVATIVES D-DIMER ([facility] ONLY) - Abnormal; Notable for the following:    Fibrin derivatives D-dimer ([facility]) 771 (*)    All other components within normal limits  TROPONIN I  TROPONIN I  POC URINE PREG, ED  POCT PREGNANCY, URINE   ____________________________________________  EKG  ED ECG REPORT I, [place],  Youlanda Roys, the attending physician, personally viewed and interpreted this ECG.   Date: 06/14/2015  EKG Time: 1244  Rate: 75  Rhythm: normal sinus rhythm  Axis: Normal axis   Intervals:none  ST&T Change: No ST segment elevation or depression. No abnormal T-wave inversion.  ____________________________________________  RADIOLOGY  No active cardiopulmonary disease on the chest x-ray. I personally reviewed these films. ____________________________________________   PROCEDURES   ____________________________________________   INITIAL IMPRESSION / ASSESSMENT AND PLAN / ED COURSE  Pertinent labs & imaging results that were available  during my care of the patient were reviewed by me and considered in my medical decision making (see chart for details).  Patient has history and physical consistent with chest wall pain. However cannot PERC out since she is 49 years old and has risk factors for cardiac disease we'll repeat a troponin.  ----------------------------------------- 6:41 PM on 06/14/2015 -----------------------------------------  Patient resting comfortably at this time but has an elevated d-dimer.  We'll perform a CT angiography. Patient with the plan.  ----------------------------------------- 8:34 PM on 06/14/2015 -----------------------------------------  Patient with CAT scan of the chest negative for pulmonary embolus. Nodularity detected. I discussed this with the patient. I gave her a copy of the report to take with her to her primary care doctor. She says that she works at the [employer] [place] [facility] here at [employer] and will also take it with her to her job to be reviewed by the cancer doctor there. I believe her pain is likely musculoskeletal. It is reproducible to palpation and also with motion. She'll be discharged home. She'll try heating pad as well as Aspercreme or icy hot for pain relief.  ____________________________________________   FINAL CLINICAL IMPRESSION(S) / ED DIAGNOSES  Acute chest pain. Acute pulmonary nodules.    Orbie Pyo, MD 06/14/15 2035

## 2015-06-14 NOTE — ED Notes (Signed)
Patient presents to the ED with left sided chest pain that radiates into her left arm.  Patient states pain started 2 days ago but today pain is constant.  Patient reports occasional shortness of breath and dizziness with the chest pain.  Patient denies nausea and vomiting.  Patient states pain is worse when she moves her left arm.  Patient states pain is improved when she stands.  Patient reports feeling like there is a "heaviness" in her chest.

## 2015-06-17 ENCOUNTER — Encounter: Payer: Self-pay | Admitting: Internal Medicine

## 2015-06-19 ENCOUNTER — Encounter: Payer: Self-pay | Admitting: Internal Medicine

## 2015-06-19 ENCOUNTER — Ambulatory Visit (INDEPENDENT_AMBULATORY_CARE_PROVIDER_SITE_OTHER): Payer: 59 | Admitting: Internal Medicine

## 2015-06-19 VITALS — BP 111/73 | HR 84 | Temp 98.2°F | Ht 66.0 in | Wt 191.5 lb

## 2015-06-19 DIAGNOSIS — M25512 Pain in left shoulder: Secondary | ICD-10-CM | POA: Diagnosis not present

## 2015-06-19 DIAGNOSIS — E119 Type 2 diabetes mellitus without complications: Secondary | ICD-10-CM

## 2015-06-19 MED ORDER — TRAMADOL HCL 50 MG PO TABS
50.0000 mg | ORAL_TABLET | Freq: Three times a day (TID) | ORAL | Status: DC | PRN
Start: 2015-06-19 — End: 2015-09-19

## 2015-06-19 MED ORDER — CYCLOBENZAPRINE HCL 5 MG PO TABS: 5.0000 mg | ORAL_TABLET | Freq: Three times a day (TID) | ORAL | Status: DC | PRN

## 2015-06-19 NOTE — Assessment & Plan Note (Signed)
Symptoms most consistent with spasm of the trapezius muscle. Will start Flexeril and prn Tramadol. Encouraged her to ice area several times daily. Set up evaluation with sports med. Follow up 4 weeks and prn.

## 2015-06-19 NOTE — Patient Instructions (Addendum)
We will set up evaluation with Dr. Gardenia Phlegm.  Start Flexeril 5mg  at bedtime as needed for pain.  Start Tramadol 50mg  every 8 hr as needed for pain.

## 2015-06-19 NOTE — Assessment & Plan Note (Signed)
Will plan to check A1c at next visit per her preference. Continue current medications.

## 2015-06-19 NOTE — Progress Notes (Signed)
Subjective:    Patient ID: Alyssa Schultz, female    DOB: August 03, 1966, 49 y.o.   MRN: [id number]  HPI  48YO female presents for acute visit.  Recently seen in the ED for chest pain.  Chest pain started 1 week ago Monday. Left sided substernal constant pain. Chest pain subsided, now having left substernal pain. Tried anti-gas medication, heating pad with no improvement. Slight dyspnea Friday, which has resolved. Walking 60 or more daily with no issues. No nausea. In the ER, cardiac markers neg. CT angiogram was normal except for small right lower lobe nodule. Using Tylenol and Motrin with no improvement.   DM - BG more elevated recently. 100-130 fasting. Compliant with medication.  Wt Readings from Last 3 Encounters:  06/19/15 191 lb 8 oz (86.864 kg)  06/14/15 189 lb (85.73 kg)  01/16/15 183 lb 6 oz (83.178 kg)   BP Readings from Last 3 Encounters:  06/19/15 111/73  06/14/15 126/99  05/30/15 129/80    Past Medical History  Diagnosis Date  . Diabetes mellitus   . Hypertension   . Hyperlipidemia    Family History  Problem Relation Age of Onset  . Diabetes Mother    Past Surgical History  Procedure Laterality Date  . Facial reconstruction surgery    . Ablation     Social History   Social History  . Marital Status: Single    Spouse Name: N/A  . Number of Children: N/A  . Years of Education: N/A   Social History Main Topics  . Smoking status: Never Smoker   . Smokeless tobacco: None  . Alcohol Use: Yes     Comment: Occasional wine  . Drug Use: No  . Sexual Activity: Not Asked   Other Topics Concern  . None   Social History Narrative    Review of Systems  Constitutional: Negative for fever, chills, appetite change, fatigue and unexpected weight change.  Eyes: Negative for visual disturbance.  Respiratory: Negative for shortness of breath.   Cardiovascular: Positive for chest pain. Negative for palpitations and leg swelling.  Gastrointestinal:  Negative for abdominal pain.  Musculoskeletal: Positive for myalgias, back pain and arthralgias. Negative for neck pain.  Skin: Negative for color change and rash.  Hematological: Negative for adenopathy. Does not bruise/bleed easily.  Psychiatric/Behavioral: Negative for sleep disturbance and dysphoric mood. The patient is not nervous/anxious.        Objective:    BP 111/73 mmHg  Pulse 84  Temp(Src) 98.2 F (36.8 C) (Oral)  Ht 5\' 6"  (1.676 m)  Wt 191 lb 8 oz (86.864 kg)  BMI 30.92 kg/m2  SpO2 95% Physical Exam  Constitutional: She is oriented to person, place, and time. She appears well-developed and well-nourished. No distress.  HENT:  Head: Normocephalic and atraumatic.  Right Ear: External ear normal.  Left Ear: External ear normal.  Nose: Nose normal.  Mouth/Throat: Oropharynx is clear and moist. No oropharyngeal exudate.  Eyes: Conjunctivae are normal. Pupils are equal, round, and reactive to light. Right eye exhibits no discharge. Left eye exhibits no discharge. No scleral icterus.  Neck: Normal range of motion. Neck supple. No tracheal deviation present. No thyromegaly present.  Cardiovascular: Normal rate, regular rhythm, normal heart sounds and intact distal pulses.  Exam reveals no gallop and no friction rub.   No murmur heard. Pulmonary/Chest: Effort normal and breath sounds normal. No respiratory distress. She has no wheezes. She has no rales. She exhibits no tenderness.  Musculoskeletal: Normal range of motion.  She exhibits no edema.       Cervical back: She exhibits tenderness, pain and spasm. She exhibits normal range of motion and no bony tenderness.       Back:  Lymphadenopathy:    She has no cervical adenopathy.  Neurological: She is alert and oriented to person, place, and time. No cranial nerve deficit. She exhibits normal muscle tone. Coordination normal.  Skin: Skin is warm and dry. No rash noted. She is not diaphoretic. No erythema. No pallor.    Psychiatric: She has a normal mood and affect. Her behavior is normal. Judgment and thought content normal.          Assessment & Plan:   Problem List Items Addressed This Visit      Unprioritized   Diabetes mellitus type 2, controlled ([place])    Will plan to check A1c at next visit per her preference. Continue current medications.      Pain in joint of left shoulder - Primary    Symptoms most consistent with spasm of the trapezius muscle. Will start Flexeril and prn Tramadol. Encouraged her to ice area several times daily. Set up evaluation with sports med. Follow up 4 weeks and prn.      Relevant Medications   traMADol (ULTRAM) 50 MG tablet   cyclobenzaprine (FLEXERIL) 5 MG tablet   Other Relevant Orders   Ambulatory referral to Sports Medicine       Return in about 4 weeks (around 07/17/2015) for Recheck.

## 2015-06-19 NOTE — Progress Notes (Signed)
Pre visit review using our clinic review tool, if applicable. No additional management support is needed unless otherwise documented below in the visit note. 

## 2015-06-20 ENCOUNTER — Telehealth: Payer: Self-pay | Admitting: Internal Medicine

## 2015-06-20 NOTE — Telephone Encounter (Signed)
Pt called wanting to know if there was a pain medicine that she could take that wouldn't make her sleepy during the day. Please advise pt @ [phone].Marland Kitchen

## 2015-06-20 NOTE — Telephone Encounter (Signed)
Did she try the Tramadol? I gave her Rx for this at last visit. This should not make her drowsy. If it does, then we are really limited to either Tylenol or Ibuprofen.

## 2015-06-20 NOTE — Telephone Encounter (Signed)
Pt states that the Tramadol is making her sleepy, but she will try taking it at night & using Ibuprofen during the day.

## 2015-06-21 ENCOUNTER — Ambulatory Visit: Payer: 59 | Admitting: Internal Medicine

## 2015-06-23 NOTE — Telephone Encounter (Signed)
Please let pt know that I recommend that she sees [employer]. It is worth the ride to [employer].

## 2015-07-01 ENCOUNTER — Other Ambulatory Visit: Payer: Self-pay | Admitting: *Deleted

## 2015-07-01 MED ORDER — GABAPENTIN 100 MG PO CAPS: 100.0000 mg | ORAL_CAPSULE | Freq: Three times a day (TID) | ORAL | Status: DC

## 2015-07-01 NOTE — Telephone Encounter (Signed)
Per Dr Gilford Rile, send Gabapentin 100mg , TID, #90

## 2015-07-02 ENCOUNTER — Ambulatory Visit: Payer: Self-pay | Admitting: Family Medicine

## 2015-07-02 ENCOUNTER — Encounter: Payer: Self-pay | Admitting: Internal Medicine

## 2015-07-09 ENCOUNTER — Ambulatory Visit: Admission: RE | Admit: 2015-07-09 | Payer: 59 | Source: Ambulatory Visit

## 2015-07-09 ENCOUNTER — Ambulatory Visit
Admission: RE | Admit: 2015-07-09 | Discharge: 2015-07-09 | Disposition: A | Discharge status: Home / Self Care | Payer: 59 | Source: Ambulatory Visit | Attending: Internal Medicine | Admitting: Internal Medicine

## 2015-07-09 ENCOUNTER — Encounter: Payer: Self-pay | Admitting: Internal Medicine

## 2015-07-09 ENCOUNTER — Other Ambulatory Visit: Payer: Self-pay | Admitting: Internal Medicine

## 2015-07-09 DIAGNOSIS — Z1231 Encounter for screening mammogram for malignant neoplasm of breast: Secondary | ICD-10-CM | POA: Diagnosis not present

## 2015-07-10 ENCOUNTER — Encounter: Payer: Self-pay | Admitting: *Deleted

## 2015-07-23 ENCOUNTER — Other Ambulatory Visit: Payer: Self-pay

## 2015-07-23 ENCOUNTER — Encounter: Payer: Self-pay | Admitting: Internal Medicine

## 2015-07-23 ENCOUNTER — Ambulatory Visit (INDEPENDENT_AMBULATORY_CARE_PROVIDER_SITE_OTHER): Payer: 59 | Admitting: Internal Medicine

## 2015-07-23 ENCOUNTER — Telehealth: Payer: Self-pay | Admitting: Internal Medicine

## 2015-07-23 VITALS — BP 136/83 | HR 78 | Temp 98.5°F | Ht 66.0 in | Wt 190.0 lb

## 2015-07-23 DIAGNOSIS — M79602 Pain in left arm: Secondary | ICD-10-CM | POA: Diagnosis not present

## 2015-07-23 DIAGNOSIS — I10 Essential (primary) hypertension: Secondary | ICD-10-CM | POA: Diagnosis not present

## 2015-07-23 DIAGNOSIS — E119 Type 2 diabetes mellitus without complications: Secondary | ICD-10-CM | POA: Diagnosis not present

## 2015-07-23 LAB — POCT URINALYSIS DIPSTICK
BILIRUBIN UA: NEGATIVE
GLUCOSE UA: NEGATIVE
Ketones, UA: NEGATIVE
Leukocytes, UA: NEGATIVE
Nitrite, UA: NEGATIVE
PH UA: 7.5
Protein, UA: NEGATIVE
RBC UA: NEGATIVE
SPEC GRAV UA: 1.015
Urobilinogen, UA: 1

## 2015-07-23 LAB — LIPID PANEL
Cholesterol: 214 mg/dL — ABNORMAL HIGH (ref 0–200)
HDL: 45.8 mg/dL (ref >39.00)
LDL Cholesterol: 153 mg/dL — ABNORMAL HIGH (ref 0–99)
NONHDL: 168.4
Total CHOL/HDL Ratio: 5
Triglycerides: 79 mg/dL (ref 0.0–149.0)
VLDL: 15.8 mg/dL (ref 0.0–40.0)

## 2015-07-23 LAB — MICROALBUMIN / CREATININE URINE RATIO
Creatinine,U: 102 mg/dL
MICROALB/CREAT RATIO: 0.8 mg/g (ref 0.0–30.0)
Microalb, Ur: 0.8 mg/dL (ref 0.0–1.9)

## 2015-07-23 LAB — COMPREHENSIVE METABOLIC PANEL
ALK PHOS: 51 U/L (ref 39–117)
ALT: 17 U/L (ref 0–35)
AST: 19 U/L (ref 0–37)
Albumin: 4.4 g/dL (ref 3.5–5.2)
BILIRUBIN TOTAL: 0.4 mg/dL (ref 0.2–1.2)
BUN: 12 mg/dL (ref 6–23)
CO2: 29 mEq/L (ref 19–32)
Calcium: 9.8 mg/dL (ref 8.4–10.5)
Chloride: 102 mEq/L (ref 96–112)
Creatinine, Ser: 0.69 mg/dL (ref 0.40–1.20)
GFR: 115.34 mL/min (ref >60.00)
GLUCOSE: 168 mg/dL — AB (ref 70–99)
Potassium: 3.8 mEq/L (ref 3.5–5.1)
SODIUM: 139 meq/L (ref 135–145)
TOTAL PROTEIN: 8 g/dL (ref 6.0–8.3)

## 2015-07-23 LAB — HEMOGLOBIN A1C: Hgb A1c MFr Bld: 7.4 % — ABNORMAL HIGH (ref 4.6–6.5)

## 2015-07-23 MED ORDER — GABAPENTIN 300 MG PO CAPS: 300.0000 mg | ORAL_CAPSULE | Freq: Three times a day (TID) | ORAL | Status: DC

## 2015-07-23 MED ORDER — PREDNISONE 20 MG PO TABS
20.0000 mg | ORAL_TABLET | Freq: Every day | ORAL | Status: DC
Start: 2015-07-23 — End: 2015-09-20

## 2015-07-23 NOTE — Progress Notes (Signed)
Subjective:    Patient ID: Alyssa Schultz, female    DOB: June 08, 1966, 49 y.o.   MRN: [id number]  HPI  [name] presents for follow up.  Left arm pain - Continues to have left arm pain and 4th and 5th finger ar numb. Strength in left hand improving. Taking Gabapentin.   DM - BG near 130-160 in morning, near 90 in evening.  Wt Readings from Last 3 Encounters:  07/23/15 190 lb (86.183 kg)  06/19/15 191 lb 8 oz (86.864 kg)  06/14/15 189 lb (85.73 kg)   BP Readings from Last 3 Encounters:  07/23/15 136/83  06/19/15 111/73  06/14/15 126/99    Past Medical History  Diagnosis Date  . Diabetes mellitus   . Hypertension   . Hyperlipidemia    Family History  Problem Relation Age of Onset  . Diabetes Mother   . Breast cancer Neg Hx    Past Surgical History  Procedure Laterality Date  . Facial reconstruction surgery    . Ablation     Social History   Social History  . Marital Status: Single    Spouse Name: N/A  . Number of Children: N/A  . Years of Education: N/A   Social History Main Topics  . Smoking status: Never Smoker   . Smokeless tobacco: None  . Alcohol Use: Yes     Comment: Occasional wine  . Drug Use: No  . Sexual Activity: Not Asked   Other Topics Concern  . None   Social History Narrative    Review of Systems  Constitutional: Negative for fever, chills, appetite change, fatigue and unexpected weight change.  Eyes: Negative for visual disturbance.  Respiratory: Negative for shortness of breath.   Cardiovascular: Negative for chest pain and leg swelling.  Gastrointestinal: Negative for abdominal pain.  Musculoskeletal: Positive for myalgias and arthralgias. Negative for neck pain and neck stiffness.  Skin: Negative for color change and rash.  Neurological: Positive for numbness. Negative for weakness and light-headedness.  Hematological: Negative for adenopathy. Does not bruise/bleed easily.  Psychiatric/Behavioral: Negative for dysphoric  mood. The patient is not nervous/anxious.        Objective:    BP 136/83 mmHg  Pulse 78  Temp(Src) 98.5 F (36.9 C) (Oral)  Ht 5\' 6"  (1.676 m)  Wt 190 lb (86.183 kg)  BMI 30.68 kg/m2  SpO2 96% Physical Exam  Constitutional: She is oriented to person, place, and time. She appears well-developed and well-nourished. No distress.  HENT:  Head: Normocephalic and atraumatic.  Right Ear: External ear normal.  Left Ear: External ear normal.  Nose: Nose normal.  Mouth/Throat: Oropharynx is clear and moist. No oropharyngeal exudate.  Eyes: Conjunctivae are normal. Pupils are equal, round, and reactive to light. Right eye exhibits no discharge. Left eye exhibits no discharge. No scleral icterus.  Neck: Normal range of motion. Neck supple. No tracheal deviation present. No thyromegaly present.  Cardiovascular: Normal rate, regular rhythm, normal heart sounds and intact distal pulses.  Exam reveals no gallop and no friction rub.   No murmur heard. Pulmonary/Chest: Effort normal and breath sounds normal. No respiratory distress. She has no wheezes. She has no rales. She exhibits no tenderness.  Musculoskeletal: Normal range of motion. She exhibits no edema or tenderness.  Lymphadenopathy:    She has no cervical adenopathy.  Neurological: She is alert and oriented to person, place, and time. She displays no atrophy. A sensory deficit is present. No cranial nerve deficit. She exhibits normal muscle  tone. Coordination and gait normal.  Decreased sensation to light touch over 4th and 5th fingers left hand. Grip strength intact.  Skin: Skin is warm and dry. No rash noted. She is not diaphoretic. No erythema. No pallor.  Psychiatric: She has a normal mood and affect. Her behavior is normal. Judgment and thought content normal.          Assessment & Plan:   Problem List Items Addressed This Visit      Unprioritized   Diabetes mellitus type 2, controlled ([place])    Will check A1c with labs.  Continue Onglyza and Metformin.      Relevant Orders   Comprehensive metabolic panel   Hemoglobin A1c   Microalbumin / creatinine urine ratio   Lipid panel   POCT Urinalysis Dipstick   Hypertension    BP Readings from Last 3 Encounters:  07/23/15 136/83  06/19/15 111/73  06/14/15 126/99   BP well controlled. Renal function with labs. Continue current medication.      Left arm pain - Primary    Left lateral arm pain radiating down to hand with left 4th and 5th finger numbness. Suspect radiculopathy. No improvement with NSAIDS and chiropractic care. Will increase Gabapentin to 300mg  tid. Add Prednisone burst. Set up MRI cervical spine to look for radiculopathy. Follow up 4 weeks.      Relevant Medications   predniSONE (DELTASONE) 20 MG tablet   gabapentin (NEURONTIN) 300 MG capsule   Other Relevant Orders   MR Cervical Spine Wo Contrast       Return in about 4 weeks (around 08/20/2015) for Recheck.

## 2015-07-23 NOTE — Assessment & Plan Note (Signed)
Left lateral arm pain radiating down to hand with left 4th and 5th finger numbness. Suspect radiculopathy. No improvement with NSAIDS and chiropractic care. Will increase Gabapentin to 300mg  tid. Add Prednisone burst. Set up MRI cervical spine to look for radiculopathy. Follow up 4 weeks.

## 2015-07-23 NOTE — Patient Outreach (Signed)
[place] [facility]) Care Management  07/23/2015  Alyssa Schultz 01/16/1966 [id number]   [facility] e-mailed me.  "I'm so overwhelmed my A1C is 7.4!!! Yes 7.4..      I have been dealing with a pinch nerve and two vertebrae going to chiropractor. I'm stressed to the max!!! I need new job like now. I'm trying to fine me one anywhere. I just want out.  Ok I'm going to do better. I got to go back to eating and walking!!! I will start back. I just have to get my pain under control. "  I have requested she schedule an appointment with me - it looks like she has an appointment scheduled in January with primary care- I will see her in February 2017.    [name], RN, BA, [place], [place] Direct Dial:  [phone]  Fax:  [phone] E-mail: [email or web address] [address] [place], [place]  [address]

## 2015-07-23 NOTE — Assessment & Plan Note (Signed)
BP Readings from Last 3 Encounters:  07/23/15 136/83  06/19/15 111/73  06/14/15 126/99   BP well controlled. Renal function with labs. Continue current medication.

## 2015-07-23 NOTE — Telephone Encounter (Signed)
Pt just wanted to make you aware that she has an appt with Dr.Shah on 08/09/15.Marland Kitchen

## 2015-07-23 NOTE — Progress Notes (Signed)
Pre visit review using our clinic review tool, if applicable. No additional management support is needed unless otherwise documented below in the visit note. 

## 2015-07-23 NOTE — Assessment & Plan Note (Signed)
Will check A1c with labs. Continue Onglyza and Metformin.

## 2015-07-23 NOTE — Patient Instructions (Signed)
Increase Gabapentin to 300mg  three times daily.  Start Prednisone 20mg  daily x 3 days.  We will set up MRI of cervical spine.

## 2015-07-25 ENCOUNTER — Encounter: Payer: Self-pay | Admitting: Internal Medicine

## 2015-08-02 ENCOUNTER — Other Ambulatory Visit: Payer: Self-pay | Admitting: Internal Medicine

## 2015-08-08 ENCOUNTER — Ambulatory Visit
Admission: RE | Admit: 2015-08-08 | Discharge: 2015-08-08 | Disposition: A | Discharge status: Home / Self Care | Payer: 59 | Source: Ambulatory Visit | Attending: Internal Medicine | Admitting: Internal Medicine

## 2015-08-08 DIAGNOSIS — M4802 Spinal stenosis, cervical region: Secondary | ICD-10-CM | POA: Insufficient documentation

## 2015-08-08 DIAGNOSIS — G9589 Other specified diseases of spinal cord: Secondary | ICD-10-CM | POA: Diagnosis not present

## 2015-08-08 DIAGNOSIS — M79602 Pain in left arm: Secondary | ICD-10-CM | POA: Insufficient documentation

## 2015-08-09 ENCOUNTER — Encounter: Payer: Self-pay | Admitting: Internal Medicine

## 2015-08-20 ENCOUNTER — Encounter: Payer: Self-pay | Admitting: Internal Medicine

## 2015-08-20 ENCOUNTER — Other Ambulatory Visit: Payer: Self-pay | Admitting: *Deleted

## 2015-08-20 MED ORDER — GABAPENTIN 100 MG PO CAPS: 100.0000 mg | ORAL_CAPSULE | Freq: Two times a day (BID) | ORAL | Status: DC

## 2015-08-22 ENCOUNTER — Ambulatory Visit: Payer: Self-pay | Admitting: Internal Medicine

## 2015-08-23 DIAGNOSIS — G959 Disease of spinal cord, unspecified: Secondary | ICD-10-CM | POA: Diagnosis not present

## 2015-08-26 ENCOUNTER — Encounter: Payer: Self-pay | Admitting: Internal Medicine

## 2015-08-27 ENCOUNTER — Ambulatory Visit: Payer: Self-pay | Admitting: Internal Medicine

## 2015-09-02 ENCOUNTER — Other Ambulatory Visit: Payer: Self-pay | Admitting: Internal Medicine

## 2015-09-02 ENCOUNTER — Other Ambulatory Visit: Payer: Self-pay | Admitting: Orthopedic Surgery

## 2015-09-04 ENCOUNTER — Encounter: Payer: Self-pay | Admitting: Internal Medicine

## 2015-09-05 ENCOUNTER — Ambulatory Visit: Payer: Self-pay | Admitting: Internal Medicine

## 2015-09-05 NOTE — Pre-Procedure Instructions (Signed)
Alyssa Schultz  09/05/2015      [place], [place] [address] Phone: [phone] Fax: [phone]  [facility] [place], [place] [address] Phone: [phone] Fax: [phone]    Your procedure is scheduled on February 2nd, Thursday   Report to [facility] Admitting at 5:30 Am             (Surgery time is posted 7:30 - 10:11am)   Call this number if you have problems the morning of surgery:  [phone]   Remember:  Do not eat food or drink liquids after midnight Wednesday.   Take these medicines the morning of surgery with A SIP OF WATER : Neurontin, Tramadol.             DO NOT TAKE any diabetic medication the morning of surgery             (Stop taking any aspirin, anti-inflammatories, herbal medications and supplements 4-5 days prior to surgery)   Do not wear jewelry, make-up or nail polish.  Do not wear lotions, powders, or perfumes.  You may NOT wear deodorant the day of surgery..  Do not shave underarms & legs 48 hours prior to surgery.   Do not bring valuables to the hospital.  [facility] is not responsible for any belongings or valuables.  Contacts, dentures or bridgework may not be worn into surgery.  Leave your suitcase in the car.  After surgery it may be brought to your room. For patients admitted to the hospital, discharge time will be determined by your treatment team.  Name and phone number of your driver:     Please read over the following fact sheets that you were given. Pain Booklet, Coughing and Deep Breathing, MRSA Information and Surgical Site Infection Prevention

## 2015-09-06 ENCOUNTER — Encounter (HOSPITAL_COMMUNITY): Payer: Self-pay

## 2015-09-06 ENCOUNTER — Encounter (HOSPITAL_COMMUNITY)
Admission: RE | Admit: 2015-09-06 | Discharge: 2015-09-06 | Disposition: A | Discharge status: Home / Self Care | Payer: 59 | Source: Ambulatory Visit | Attending: Orthopedic Surgery | Admitting: Orthopedic Surgery

## 2015-09-06 DIAGNOSIS — Z01812 Encounter for preprocedural laboratory examination: Secondary | ICD-10-CM | POA: Diagnosis not present

## 2015-09-06 DIAGNOSIS — M5410 Radiculopathy, site unspecified: Secondary | ICD-10-CM | POA: Insufficient documentation

## 2015-09-06 LAB — URINALYSIS, ROUTINE W REFLEX MICROSCOPIC
Bilirubin Urine: NEGATIVE
GLUCOSE, UA: NEGATIVE mg/dL
HGB URINE DIPSTICK: NEGATIVE
Ketones, ur: NEGATIVE mg/dL
Leukocytes, UA: NEGATIVE
Nitrite: NEGATIVE
PH: 6 (ref 5.0–8.0)
PROTEIN: NEGATIVE mg/dL
Specific Gravity, Urine: 1.014 (ref 1.005–1.030)

## 2015-09-06 LAB — CBC WITH DIFFERENTIAL/PLATELET
Basophils Absolute: 0 10*3/uL (ref 0.0–0.1)
Basophils Relative: 0 %
Eosinophils Absolute: 0.1 10*3/uL (ref 0.0–0.7)
Eosinophils Relative: 1 %
HCT: 41.4 % (ref 36.0–46.0)
HEMOGLOBIN: 14.1 g/dL (ref 12.0–15.0)
LYMPHS ABS: 2 10*3/uL (ref 0.7–4.0)
LYMPHS PCT: 23 %
MCH: 29 pg (ref 26.0–34.0)
MCHC: 34.1 g/dL (ref 30.0–36.0)
MCV: 85.2 fL (ref 78.0–100.0)
Monocytes Absolute: 0.5 10*3/uL (ref 0.1–1.0)
Monocytes Relative: 6 %
NEUTROS ABS: 6 10*3/uL (ref 1.7–7.7)
NEUTROS PCT: 70 %
Platelets: 253 10*3/uL (ref 150–400)
RBC: 4.86 MIL/uL (ref 3.87–5.11)
RDW: 14 % (ref 11.5–15.5)
WBC: 8.6 10*3/uL (ref 4.0–10.5)

## 2015-09-06 LAB — COMPREHENSIVE METABOLIC PANEL
ALK PHOS: 51 U/L (ref 38–126)
ALT: 18 U/L (ref 14–54)
AST: 19 U/L (ref 15–41)
Albumin: 4.2 g/dL (ref 3.5–5.0)
Anion gap: 11 (ref 5–15)
BUN: 10 mg/dL (ref 6–20)
CALCIUM: 9.8 mg/dL (ref 8.9–10.3)
CO2: 27 mmol/L (ref 22–32)
CREATININE: 0.75 mg/dL (ref 0.44–1.00)
Chloride: 103 mmol/L (ref 101–111)
Glucose, Bld: 198 mg/dL — ABNORMAL HIGH (ref 65–99)
Potassium: 4.2 mmol/L (ref 3.5–5.1)
Sodium: 141 mmol/L (ref 135–145)
Total Bilirubin: 0.6 mg/dL (ref 0.3–1.2)
Total Protein: 8.1 g/dL (ref 6.5–8.1)

## 2015-09-06 LAB — PROTIME-INR
INR: 0.95 (ref 0.00–1.49)
PROTHROMBIN TIME: 12.9 s (ref 11.6–15.2)

## 2015-09-06 LAB — SURGICAL PCR SCREEN
MRSA, PCR: NEGATIVE
Staphylococcus aureus: NEGATIVE

## 2015-09-06 LAB — GLUCOSE, CAPILLARY: Glucose-Capillary: 195 mg/dL — ABNORMAL HIGH (ref 65–99)

## 2015-09-06 LAB — APTT: aPTT: 28 seconds (ref 24–37)

## 2015-09-06 NOTE — Progress Notes (Signed)
Has never had any heart issues, no cardiac tests done, and has never seen a cardiologist. PCP is Ronette Deter - LOV 07/2015

## 2015-09-11 ENCOUNTER — Other Ambulatory Visit: Payer: Self-pay | Admitting: Internal Medicine

## 2015-09-11 ENCOUNTER — Other Ambulatory Visit: Payer: Self-pay | Admitting: Family

## 2015-09-13 ENCOUNTER — Other Ambulatory Visit: Payer: Self-pay

## 2015-09-13 DIAGNOSIS — E119 Type 2 diabetes mellitus without complications: Secondary | ICD-10-CM

## 2015-09-13 NOTE — Patient Outreach (Signed)
[place] [facility]) Care Management  [place]  09/13/2015   ALBA PERILLO Apr 14, 1966 [id number]  Subjective: Met with patient for her [facility] to Wellness diabetes visit through [facility].  Patient reports having to have a ACDR on 09/19/15.  She tells me although she currently does not have any pain in her left arm and back, she does experience numbness in her left arm when she stands and in the back of her left thigh.  She is not currently using pain meds but she is using Gabapentin as ordered.  She is taking diabetes meds as ordered and understands her pre- op medication instructions.  Jamea reports blood sugars highest in the morning 135-164m/dl and after work sugars 929mdl.  2 hr. Post prandial blood sugars 130-14096ml   Current Medications:  Current Outpatient Prescriptions  Medication Sig Dispense Refill  . gabapentin (NEURONTIN) 100 MG capsule Take 1 capsule (100 mg total) by mouth 2 (two) times daily. 180 capsule 1  . gabapentin (NEURONTIN) 300 MG capsule Take 1 capsule (300 mg total) by mouth 3 (three) times daily. (Patient taking differently: Take 300 mg by mouth at bedtime. ) 90 capsule 3  . glucose blood test strip Patient is testing at home 1-2 times daily. 100 each 12  . hydrochlorothiazide (HYDRODIURIL) 25 MG tablet TAKE 1 TABLET (25 MG TOTAL) BY MOUTH DAILY. 90 tablet 3  . lisinopril (PRINIVIL,ZESTRIL) 10 MG tablet TAKE ONE TABLET BY MOUTH DAILY 90 tablet 1  . meclizine (ANTIVERT) 50 MG tablet Take 1 tablet (50 mg total) by mouth 3 (three) times daily as needed for dizziness. 30 tablet 1  . metFORMIN (GLUCOPHAGE) 500 MG tablet TAKE 1 TABLET BY MOUTH 2 TIMES DAILY WITH A MEAL. 60 tablet 5  . ONGLYZA 2.5 MG TABS tablet TAKE 1 TABLET (2.5 MG TOTAL) BY MOUTH DAILY. 30 tablet 6  . traMADol (ULTRAM) 50 MG tablet Take 1 tablet (50 mg total) by mouth every 8 (eight) hours as needed. 90 tablet 0  . ACCU-CHEK SMARTVIEW test strip TEST AT HOME 1 TO 2 TIMES DAILY.  (Patient not taking: Reported on 09/13/2015) 100 each 12  . B-D ULTRA-FINE 33 LANCETS MISC Use as directed (Patient not taking: Reported on 09/13/2015) 100 each 5  . cyclobenzaprine (FLEXERIL) 5 MG tablet Take 1 tablet (5 mg total) by mouth 3 (three) times daily as needed for muscle spasms. (Patient not taking: Reported on 09/06/2015) 30 tablet 1  . fluconazole (DIFLUCAN) 100 MG tablet Take 100 mg by mouth daily as needed. Reported on 09/13/2015  2  . lubiprostone (AMITIZA) 24 MCG capsule Take 1 capsule (24 mcg total) by mouth daily with breakfast. (Patient not taking: Reported on 09/06/2015) 30 capsule 3  . metroNIDAZOLE (FLAGYL) 500 MG tablet Take 1 tablet (500 mg total) by mouth 2 (two) times daily. (Patient not taking: Reported on 09/06/2015) 14 tablet 0  . predniSONE (DELTASONE) 20 MG tablet Take 1 tablet (20 mg total) by mouth daily with breakfast. (Patient not taking: Reported on 09/06/2015) 3 tablet 0   No current facility-administered medications for this visit.    Functional Status:  In your present state of health, do you have any difficulty performing the following activities: 09/06/2015  Hearing? N  Vision? N  Difficulty concentrating or making decisions? N  Walking or climbing stairs? N  Dressing or bathing? N  Doing errands, shopping? N    Fall/Depression Screening: PHQ 2/9 Scores 09/13/2015  PHQ - 2 Score 0  Assessment: Ellenie is checking blood sugars regularly.  She consistently aims for 15 grams of carb at snacks and 30-45 grams of carbs at meals. We discussed the affect steroids have on blood sugar numbers (if the MD uses steroids during hospitalization).  She will check blood sugars regularly after surgery and MD has encouraged her to exercise as she is able post-op.  I have encouraged her to call me if she has questions or concerns.   Plan:  [facility] CM Care Plan Problem One        Most Recent Value   Care Plan Problem One  Potential for elevated blood sugars as a result of pain  and surgery (September 19, 2015)   Role Documenting the Problem One  Care Management [place] for Problem One  Active   [facility] Long Term Goal (31-90 days)  Patient will maintain blood sugars near pre- surgery levels.    [facility] Long Term Goal Start Date  09/13/15   Interventions for Problem One Long Term Goal  1. Speak to MD/surgeon about steroid use 2. Don't refuse insulin in hospital if it's needed to control sugars 3. check blood sugars  2x/day post surgery, more if it's elevated  4. Call Dr. Gilford Rile if sugars stay elevated post surgery  5. take pain med as ordered     I will follow up with her post surgery.   [name], RN, BA, [place], [place] Direct Dial:  [phone]  Fax:  [phone] E-mail: [email or web address] [address] [place], [place]  [address]

## 2015-09-14 DIAGNOSIS — G959 Disease of spinal cord, unspecified: Secondary | ICD-10-CM | POA: Diagnosis not present

## 2015-09-16 ENCOUNTER — Telehealth: Payer: Self-pay | Admitting: Physician Assistant

## 2015-09-16 MED ORDER — GLUCOSE BLOOD VI STRP: ORAL_STRIP | Status: DC

## 2015-09-16 NOTE — Telephone Encounter (Signed)
Pt needed rx for glucose test strips, sent to [facility]

## 2015-09-18 MED ORDER — CEFAZOLIN SODIUM-DEXTROSE 2-3 GM-% IV SOLR
2.0000 g | INTRAVENOUS | Status: AC
  Administered 2015-09-19: 2 g via INTRAVENOUS
  Filled 2015-09-18: qty 50

## 2015-09-18 NOTE — H&P (Signed)
PREOPERATIVE H&P  Chief Complaint: L arm pain  HPI: Alyssa Schultz is a 50 y.o. 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 Hypertension   . Hyperlipidemia   . Diabetes mellitus     dx 2014   just take pills   Past Surgical History  Procedure Laterality Date  . Facial reconstruction surgery    . Ablation     Social History   Social History  . Marital Status: Single    Spouse Name: N/A  . Number of Children: N/A  . Years of Education: N/A   Social History Main Topics  . Smoking status: Never Smoker   . Smokeless tobacco: Not on file  . Alcohol Use: Yes     Comment: Occasional wine  . Drug Use: No  . Sexual Activity: Not on file   Other Topics Concern  . Not on file   Social History Narrative   Family History  Problem Relation Age of Onset  . Diabetes Mother   . Breast cancer Neg Hx   . Diabetes Brother    Allergies  Allergen Reactions  . Influenza Vaccine Live   . Morphine And Related Other (See Comments)    Makes her crazy  . Hydrocodone Rash   Prior to Admission medications   Medication Sig Start Date End Date Taking? Authorizing Provider  gabapentin (NEURONTIN) 100 MG capsule Take 1 capsule (100 mg total) by mouth 2 (two) times daily. 08/20/15  Yes Jackolyn Confer, MD  gabapentin (NEURONTIN) 300 MG capsule Take 1 capsule (300 mg total) by mouth 3 (three) times daily. Patient taking differently: Take 300 mg by mouth at bedtime.  07/23/15  Yes Jackolyn Confer, MD  hydrochlorothiazide (HYDRODIURIL) 25 MG tablet TAKE 1 TABLET (25 MG TOTAL) BY MOUTH DAILY. 09/02/15  Yes Jackolyn Confer, MD  lisinopril (PRINIVIL,ZESTRIL) 10 MG tablet TAKE ONE TABLET BY MOUTH DAILY 04/23/15  Yes Jackolyn Confer, MD    metFORMIN (GLUCOPHAGE) 500 MG tablet TAKE 1 TABLET BY MOUTH 2 TIMES DAILY WITH A MEAL. 03/11/15  Yes Jackolyn Confer, MD  ONGLYZA 2.5 MG TABS tablet TAKE 1 TABLET (2.5 MG TOTAL) BY MOUTH DAILY. 04/08/15  Yes Jackolyn Confer, MD  B-D ULTRA-FINE 33 LANCETS MISC Use as directed Patient not taking: Reported on 09/13/2015 09/17/14   Jackolyn Confer, MD  cyclobenzaprine (FLEXERIL) 5 MG tablet Take 1 tablet (5 mg total) by mouth 3 (three) times daily as needed for muscle spasms. Patient not taking: Reported on 09/06/2015 06/19/15   Jackolyn Confer, MD  fluconazole (DIFLUCAN) 100 MG tablet Take 100 mg by mouth daily as needed. Reported on 09/13/2015 05/15/15   Historical Provider, MD  glucose blood test strip Patient is testing at home 1-2 times daily. 02/23/13   Jackolyn Confer, MD  glucose blood test strip Use as instructed 09/16/15   Versie Starks, PA-C  lubiprostone (AMITIZA) 24 MCG capsule Take 1 capsule (24 mcg total) by mouth daily with breakfast. Patient not taking: Reported on 09/06/2015 10/31/14   Jackolyn Confer, MD  meclizine (ANTIVERT) 50 MG tablet Take 1 tablet (50 mg total) by mouth 3 (three) times daily as needed for dizziness. 01/06/15   Malachy Chamber  [place], PA-C  metroNIDAZOLE (FLAGYL) 500 MG tablet Take 1 tablet (500 mg total) by mouth 2 (two) times daily. Patient not taking: Reported on 09/06/2015 11/07/14   Jackolyn Confer, MD  predniSONE (DELTASONE) 20 MG tablet Take 1 tablet (20 mg total) by mouth daily with breakfast. Patient not taking: Reported on 09/06/2015 07/23/15   Jackolyn Confer, MD  traMADol (ULTRAM) 50 MG tablet Take 1 tablet (50 mg total) by mouth every 8 (eight) hours as needed. 06/19/15   Jackolyn Confer, MD     All other systems have been reviewed and were otherwise negative with the exception of those mentioned in the HPI and as above.  Physical Exam: There were no vitals filed for this visit.  General: Alert, no acute distress Cardiovascular: No pedal  edema Respiratory: No cyanosis, no use of accessory musculature Skin: No lesions in the area of chief complaint Neurologic: Sensation intact distally Psychiatric: Patient is competent for consent with normal mood and affect Lymphatic: No axillary or cervical lymphadenopathy  MUSCULOSKELETAL: + hoffman's sign on the right  Assessment/Plan: Radiculopathy and myeolopathy Plan for Procedure(s): ANTERIOR CERVICAL DECOMPRESSION/DISCECTOMY FUSION 2 LEVELS   Sinclair Ship, MD 09/18/2015 4:30 PM

## 2015-09-18 NOTE — Anesthesia Preprocedure Evaluation (Addendum)
Anesthesia Evaluation  Patient identified by MRN, date of birth, ID band Patient awake    Reviewed: Allergy & Precautions, H&P , NPO status , Patient's Chart, lab work & pertinent test results  Airway Mallampati: II  TM Distance: >3 FB Neck ROM: Full    Dental no notable dental hx. (+) Teeth Intact, Dental Advisory Given   Pulmonary neg pulmonary ROS,    Pulmonary exam normal breath sounds clear to auscultation       Cardiovascular hypertension, Pt. on medications  Rhythm:Regular Rate:Normal     Neuro/Psych negative neurological ROS  negative psych ROS   GI/Hepatic negative GI ROS, Neg liver ROS,   Endo/Other  diabetes, Type 2, Oral Hypoglycemic Agents  Renal/GU negative Renal ROS  negative genitourinary   Musculoskeletal   Abdominal   Peds  Hematology negative hematology ROS (+)   Anesthesia Other Findings   Reproductive/Obstetrics negative OB ROS                            Anesthesia Physical Anesthesia Plan  ASA: II  Anesthesia Plan: General   Post-op Pain Management:    Induction: Intravenous  Airway Management Planned: Oral ETT  Additional Equipment:   Intra-op Plan:   Post-operative Plan: Extubation in OR  Informed Consent: I have reviewed the patients History and Physical, chart, labs and discussed the procedure including the risks, benefits and alternatives for the proposed anesthesia with the patient or authorized representative who has indicated his/her understanding and acceptance.   Dental advisory given  Plan Discussed with: CRNA  Anesthesia Plan Comments:         Anesthesia Quick Evaluation

## 2015-09-19 ENCOUNTER — Encounter (HOSPITAL_COMMUNITY)
Admission: AD | Disposition: A | Discharge status: Home / Self Care | Payer: 59 | Source: Ambulatory Visit | Attending: Orthopedic Surgery

## 2015-09-19 ENCOUNTER — Inpatient Hospital Stay (HOSPITAL_COMMUNITY)
Admission: AD | Admit: 2015-09-19 | Discharge: 2015-09-20 | DRG: 029 | Disposition: A | Discharge status: Home / Self Care | Payer: 59 | Source: Ambulatory Visit | Attending: Orthopedic Surgery | Admitting: Orthopedic Surgery

## 2015-09-19 ENCOUNTER — Encounter (HOSPITAL_COMMUNITY): Payer: Self-pay | Admitting: Anesthesiology

## 2015-09-19 ENCOUNTER — Ambulatory Visit (HOSPITAL_COMMUNITY): Payer: 59 | Admitting: Anesthesiology

## 2015-09-19 ENCOUNTER — Inpatient Hospital Stay (HOSPITAL_COMMUNITY): Payer: 59

## 2015-09-19 DIAGNOSIS — M5412 Radiculopathy, cervical region: Secondary | ICD-10-CM | POA: Diagnosis not present

## 2015-09-19 DIAGNOSIS — I10 Essential (primary) hypertension: Secondary | ICD-10-CM | POA: Diagnosis present

## 2015-09-19 DIAGNOSIS — Z7984 Long term (current) use of oral hypoglycemic drugs: Secondary | ICD-10-CM

## 2015-09-19 DIAGNOSIS — G9520 Unspecified cord compression: Secondary | ICD-10-CM | POA: Diagnosis not present

## 2015-09-19 DIAGNOSIS — E119 Type 2 diabetes mellitus without complications: Secondary | ICD-10-CM | POA: Diagnosis present

## 2015-09-19 DIAGNOSIS — E785 Hyperlipidemia, unspecified: Secondary | ICD-10-CM | POA: Diagnosis present

## 2015-09-19 DIAGNOSIS — M4322 Fusion of spine, cervical region: Secondary | ICD-10-CM | POA: Diagnosis not present

## 2015-09-19 DIAGNOSIS — G959 Disease of spinal cord, unspecified: Secondary | ICD-10-CM | POA: Diagnosis present

## 2015-09-19 DIAGNOSIS — M79602 Pain in left arm: Secondary | ICD-10-CM | POA: Diagnosis present

## 2015-09-19 DIAGNOSIS — G9589 Other specified diseases of spinal cord: Secondary | ICD-10-CM | POA: Diagnosis not present

## 2015-09-19 DIAGNOSIS — M4712 Other spondylosis with myelopathy, cervical region: Secondary | ICD-10-CM | POA: Diagnosis present

## 2015-09-19 DIAGNOSIS — Z419 Encounter for procedure for purposes other than remedying health state, unspecified: Secondary | ICD-10-CM

## 2015-09-19 HISTORY — PX: ANTERIOR CERVICAL DECOMP/DISCECTOMY FUSION: SHX1161

## 2015-09-19 LAB — GLUCOSE, CAPILLARY
GLUCOSE-CAPILLARY: 196 mg/dL — AB (ref 65–99)
Glucose-Capillary: 179 mg/dL — ABNORMAL HIGH (ref 65–99)
Glucose-Capillary: 176 mg/dL — ABNORMAL HIGH (ref 65–99)
Glucose-Capillary: 146 mg/dL — ABNORMAL HIGH (ref 65–99)

## 2015-09-19 SURGERY — ANTERIOR CERVICAL DECOMPRESSION/DISCECTOMY FUSION 2 LEVELS
Anesthesia: General

## 2015-09-19 MED ORDER — ACETAMINOPHEN 325 MG PO TABS: 650.0000 mg | ORAL_TABLET | ORAL | Status: DC | PRN

## 2015-09-19 MED ORDER — LINAGLIPTIN 5 MG PO TABS
5.0000 mg | ORAL_TABLET | Freq: Every day | ORAL | Status: DC
  Administered 2015-09-19 – 2015-09-20 (×2): 5 mg via ORAL
  Filled 2015-09-19 (×2): qty 1

## 2015-09-19 MED ORDER — POVIDONE-IODINE 7.5 % EX SOLN
Freq: Once | CUTANEOUS | Status: DC
  Filled 2015-09-19: qty 118

## 2015-09-19 MED ORDER — GLYCOPYRROLATE 0.2 MG/ML IJ SOLN
INTRAMUSCULAR | Status: AC
  Filled 2015-09-19: qty 3

## 2015-09-19 MED ORDER — ONDANSETRON HCL 4 MG/2ML IJ SOLN
4.0000 mg | INTRAMUSCULAR | Status: DC | PRN
  Administered 2015-09-19: 4 mg via INTRAVENOUS
  Filled 2015-09-19: qty 2

## 2015-09-19 MED ORDER — LISINOPRIL 20 MG PO TABS
10.0000 mg | ORAL_TABLET | Freq: Every day | ORAL | Status: DC
  Administered 2015-09-19 – 2015-09-20 (×2): 10 mg via ORAL
  Filled 2015-09-19 (×2): qty 1

## 2015-09-19 MED ORDER — EPHEDRINE SULFATE 50 MG/ML IJ SOLN
INTRAMUSCULAR | Status: DC | PRN
  Administered 2015-09-19: 15 mg via INTRAVENOUS

## 2015-09-19 MED ORDER — BISACODYL 5 MG PO TBEC: 5.0000 mg | DELAYED_RELEASE_TABLET | Freq: Every day | ORAL | Status: DC | PRN

## 2015-09-19 MED ORDER — BUPIVACAINE-EPINEPHRINE 0.25% -1:200000 IJ SOLN
INTRAMUSCULAR | Status: DC | PRN
  Administered 2015-09-19: 30 mL

## 2015-09-19 MED ORDER — THROMBIN 20000 UNITS EX KIT
PACK | CUTANEOUS | Status: DC | PRN
  Administered 2015-09-19: 20000 [IU] via TOPICAL

## 2015-09-19 MED ORDER — LACTATED RINGERS IV SOLN
INTRAVENOUS | Status: DC | PRN
  Administered 2015-09-19 (×3): via INTRAVENOUS

## 2015-09-19 MED ORDER — ALUM & MAG HYDROXIDE-SIMETH 200-200-20 MG/5ML PO SUSP: 30.0000 mL | Freq: Four times a day (QID) | ORAL | Status: DC | PRN

## 2015-09-19 MED ORDER — NEOSTIGMINE METHYLSULFATE 10 MG/10ML IV SOLN
INTRAVENOUS | Status: DC | PRN
  Administered 2015-09-19: 4 mg via INTRAVENOUS

## 2015-09-19 MED ORDER — PHENYLEPHRINE 40 MCG/ML (10ML) SYRINGE FOR IV PUSH (FOR BLOOD PRESSURE SUPPORT)
PREFILLED_SYRINGE | INTRAVENOUS | Status: AC
  Filled 2015-09-19: qty 10

## 2015-09-19 MED ORDER — PHENOL 1.4 % MT LIQD
1.0000 | OROMUCOSAL | Status: DC | PRN
  Administered 2015-09-19: 1 via OROMUCOSAL
  Filled 2015-09-19: qty 177

## 2015-09-19 MED ORDER — HYDROMORPHONE HCL 1 MG/ML IJ SOLN
0.2500 mg | INTRAMUSCULAR | Status: DC | PRN
  Administered 2015-09-19: 1 mg via INTRAVENOUS

## 2015-09-19 MED ORDER — MIDAZOLAM HCL 5 MG/5ML IJ SOLN
INTRAMUSCULAR | Status: DC | PRN
  Administered 2015-09-19: 2 mg via INTRAVENOUS

## 2015-09-19 MED ORDER — ROCURONIUM BROMIDE 100 MG/10ML IV SOLN
INTRAVENOUS | Status: DC | PRN
  Administered 2015-09-19: 50 mg via INTRAVENOUS

## 2015-09-19 MED ORDER — DOCUSATE SODIUM 100 MG PO CAPS
100.0000 mg | ORAL_CAPSULE | Freq: Two times a day (BID) | ORAL | Status: DC
  Administered 2015-09-19 – 2015-09-20 (×2): 100 mg via ORAL
  Filled 2015-09-19 (×2): qty 1

## 2015-09-19 MED ORDER — THROMBIN 20000 UNITS EX SOLR
CUTANEOUS | Status: AC
  Filled 2015-09-19: qty 20000

## 2015-09-19 MED ORDER — OXYCODONE-ACETAMINOPHEN 5-325 MG PO TABS
ORAL_TABLET | ORAL | Status: AC
  Filled 2015-09-19: qty 2

## 2015-09-19 MED ORDER — SENNOSIDES-DOCUSATE SODIUM 8.6-50 MG PO TABS
1.0000 | ORAL_TABLET | Freq: Every evening | ORAL | Status: DC | PRN
Start: 2015-09-19 — End: 2015-09-20

## 2015-09-19 MED ORDER — SUCCINYLCHOLINE CHLORIDE 20 MG/ML IJ SOLN
INTRAMUSCULAR | Status: AC
  Filled 2015-09-19: qty 1

## 2015-09-19 MED ORDER — 0.9 % SODIUM CHLORIDE (POUR BTL) OPTIME
TOPICAL | Status: DC | PRN
  Administered 2015-09-19: 1000 mL

## 2015-09-19 MED ORDER — LIDOCAINE HCL (CARDIAC) 20 MG/ML IV SOLN
INTRAVENOUS | Status: AC
  Filled 2015-09-19: qty 5

## 2015-09-19 MED ORDER — FLUCONAZOLE 100 MG PO TABS: 100.0000 mg | ORAL_TABLET | Freq: Every day | ORAL | Status: DC | PRN

## 2015-09-19 MED ORDER — PHENYLEPHRINE HCL 10 MG/ML IJ SOLN
INTRAMUSCULAR | Status: AC
  Filled 2015-09-19: qty 1

## 2015-09-19 MED ORDER — MINERAL OIL LIGHT 100 % EX OIL
TOPICAL_OIL | CUTANEOUS | Status: AC
  Filled 2015-09-19: qty 25

## 2015-09-19 MED ORDER — PHENYLEPHRINE HCL 10 MG/ML IJ SOLN
INTRAMUSCULAR | Status: DC | PRN
  Administered 2015-09-19: 160 ug via INTRAVENOUS
  Administered 2015-09-19 (×4): 80 ug via INTRAVENOUS

## 2015-09-19 MED ORDER — SODIUM CHLORIDE 0.9% FLUSH: 3.0000 mL | INTRAVENOUS | Status: DC | PRN

## 2015-09-19 MED ORDER — FENTANYL CITRATE (PF) 250 MCG/5ML IJ SOLN
INTRAMUSCULAR | Status: AC
  Filled 2015-09-19: qty 5

## 2015-09-19 MED ORDER — SODIUM CHLORIDE 0.9 % IJ SOLN
INTRAMUSCULAR | Status: AC
  Filled 2015-09-19: qty 10

## 2015-09-19 MED ORDER — METFORMIN HCL 500 MG PO TABS
500.0000 mg | ORAL_TABLET | Freq: Two times a day (BID) | ORAL | Status: DC
  Administered 2015-09-19 – 2015-09-20 (×2): 500 mg via ORAL
  Filled 2015-09-19 (×2): qty 1

## 2015-09-19 MED ORDER — ONDANSETRON HCL 4 MG/2ML IJ SOLN
INTRAMUSCULAR | Status: AC
  Filled 2015-09-19: qty 2

## 2015-09-19 MED ORDER — ACETAMINOPHEN 650 MG RE SUPP: 650.0000 mg | RECTAL | Status: DC | PRN

## 2015-09-19 MED ORDER — MINERAL OIL LIGHT 100 % EX OIL
TOPICAL_OIL | CUTANEOUS | Status: DC | PRN
  Administered 2015-09-19: 1 via TOPICAL

## 2015-09-19 MED ORDER — ZOLPIDEM TARTRATE 5 MG PO TABS: 5.0000 mg | ORAL_TABLET | Freq: Every evening | ORAL | Status: DC | PRN

## 2015-09-19 MED ORDER — GABAPENTIN 100 MG PO CAPS
100.0000 mg | ORAL_CAPSULE | Freq: Two times a day (BID) | ORAL | Status: DC
  Administered 2015-09-19 – 2015-09-20 (×2): 100 mg via ORAL
  Filled 2015-09-19 (×2): qty 1

## 2015-09-19 MED ORDER — ALBUMIN HUMAN 5 % IV SOLN
INTRAVENOUS | Status: DC | PRN
  Administered 2015-09-19: 08:00:00 via INTRAVENOUS

## 2015-09-19 MED ORDER — FLEET ENEMA 7-19 GM/118ML RE ENEM: 1.0000 | ENEMA | Freq: Once | RECTAL | Status: DC | PRN

## 2015-09-19 MED ORDER — GABAPENTIN 300 MG PO CAPS
300.0000 mg | ORAL_CAPSULE | Freq: Every day | ORAL | Status: DC
  Administered 2015-09-19: 300 mg via ORAL
  Filled 2015-09-19: qty 1

## 2015-09-19 MED ORDER — ROCURONIUM BROMIDE 50 MG/5ML IV SOLN
INTRAVENOUS | Status: AC
  Filled 2015-09-19: qty 1

## 2015-09-19 MED ORDER — MIDAZOLAM HCL 2 MG/2ML IJ SOLN
INTRAMUSCULAR | Status: AC
  Filled 2015-09-19: qty 2

## 2015-09-19 MED ORDER — MECLIZINE HCL 12.5 MG PO TABS: 50.0000 mg | ORAL_TABLET | Freq: Three times a day (TID) | ORAL | Status: DC | PRN

## 2015-09-19 MED ORDER — MENTHOL 3 MG MT LOZG
1.0000 | LOZENGE | OROMUCOSAL | Status: DC | PRN
  Filled 2015-09-19: qty 9

## 2015-09-19 MED ORDER — DIAZEPAM 5 MG PO TABS
5.0000 mg | ORAL_TABLET | Freq: Four times a day (QID) | ORAL | Status: DC | PRN
  Administered 2015-09-19 – 2015-09-20 (×3): 5 mg via ORAL
  Filled 2015-09-19 (×2): qty 1

## 2015-09-19 MED ORDER — NEOSTIGMINE METHYLSULFATE 10 MG/10ML IV SOLN
INTRAVENOUS | Status: AC
  Filled 2015-09-19: qty 1

## 2015-09-19 MED ORDER — OXYCODONE-ACETAMINOPHEN 5-325 MG PO TABS
1.0000 | ORAL_TABLET | ORAL | Status: DC | PRN
  Administered 2015-09-19 (×2): 2 via ORAL
  Administered 2015-09-19 – 2015-09-20 (×3): 1 via ORAL
  Filled 2015-09-19 (×3): qty 1
  Filled 2015-09-19: qty 2

## 2015-09-19 MED ORDER — CEFAZOLIN SODIUM 1-5 GM-% IV SOLN
1.0000 g | Freq: Three times a day (TID) | INTRAVENOUS | Status: AC
  Administered 2015-09-19 (×2): 1 g via INTRAVENOUS
  Filled 2015-09-19 (×2): qty 50

## 2015-09-19 MED ORDER — EPHEDRINE SULFATE 50 MG/ML IJ SOLN
INTRAMUSCULAR | Status: AC
  Filled 2015-09-19: qty 1

## 2015-09-19 MED ORDER — PROPOFOL 10 MG/ML IV BOLUS
INTRAVENOUS | Status: AC
  Filled 2015-09-19: qty 40

## 2015-09-19 MED ORDER — LIDOCAINE HCL (CARDIAC) 20 MG/ML IV SOLN
INTRAVENOUS | Status: DC | PRN
  Administered 2015-09-19 (×2): 50 mg via INTRAVENOUS

## 2015-09-19 MED ORDER — DIAZEPAM 5 MG PO TABS
ORAL_TABLET | ORAL | Status: AC
  Filled 2015-09-19: qty 1

## 2015-09-19 MED ORDER — HYDROCHLOROTHIAZIDE 25 MG PO TABS
25.0000 mg | ORAL_TABLET | Freq: Every day | ORAL | Status: DC
  Administered 2015-09-19 – 2015-09-20 (×2): 25 mg via ORAL
  Filled 2015-09-19 (×2): qty 1

## 2015-09-19 MED ORDER — ONDANSETRON HCL 4 MG/2ML IJ SOLN
INTRAMUSCULAR | Status: DC | PRN
  Administered 2015-09-19: 4 mg via INTRAVENOUS

## 2015-09-19 MED ORDER — HYDROMORPHONE HCL 1 MG/ML IJ SOLN
INTRAMUSCULAR | Status: AC
  Filled 2015-09-19: qty 1

## 2015-09-19 MED ORDER — GLYCOPYRROLATE 0.2 MG/ML IJ SOLN
INTRAMUSCULAR | Status: DC | PRN
  Administered 2015-09-19: 0.6 mg via INTRAVENOUS

## 2015-09-19 MED ORDER — BUPIVACAINE-EPINEPHRINE (PF) 0.25% -1:200000 IJ SOLN
INTRAMUSCULAR | Status: AC
  Filled 2015-09-19: qty 30

## 2015-09-19 MED ORDER — MORPHINE SULFATE (PF) 2 MG/ML IV SOLN: 1.0000 mg | INTRAVENOUS | Status: DC | PRN

## 2015-09-19 MED ORDER — FENTANYL CITRATE (PF) 100 MCG/2ML IJ SOLN
INTRAMUSCULAR | Status: DC | PRN
  Administered 2015-09-19 (×5): 50 ug via INTRAVENOUS

## 2015-09-19 MED ORDER — PROPOFOL 10 MG/ML IV BOLUS
INTRAVENOUS | Status: DC | PRN
  Administered 2015-09-19: 160 mg via INTRAVENOUS

## 2015-09-19 MED ORDER — METRONIDAZOLE 500 MG PO TABS: 500.0000 mg | ORAL_TABLET | Freq: Two times a day (BID) | ORAL | Status: DC

## 2015-09-19 MED ORDER — SODIUM CHLORIDE 0.9% FLUSH
3.0000 mL | Freq: Two times a day (BID) | INTRAVENOUS | Status: DC
  Administered 2015-09-19 (×2): 3 mL via INTRAVENOUS

## 2015-09-19 MED ORDER — PHENYLEPHRINE HCL 10 MG/ML IJ SOLN
10.0000 mg | INTRAMUSCULAR | Status: DC | PRN
  Administered 2015-09-19: 20 ug/min via INTRAVENOUS

## 2015-09-19 SURGICAL SUPPLY — 75 items
APL SKNCLS STERI-STRIP NONHPOA (GAUZE/BANDAGES/DRESSINGS) ×1
BENZOIN TINCTURE PRP APPL 2/3 (GAUZE/BANDAGES/DRESSINGS) ×2 IMPLANT
BIT DRILL NEURO 2X3.1 SFT TUCH (MISCELLANEOUS) ×1 IMPLANT
BIT DRILL SRG 14X2.2XFLT CHK (BIT) IMPLANT
BIT DRL SRG 14X2.2XFLT CHK (BIT) ×1
BLADE SURG 15 STRL LF DISP TIS (BLADE) ×1 IMPLANT
BLADE SURG 15 STRL SS (BLADE) ×2
BLADE SURG ROTATE 9660 (MISCELLANEOUS) ×2 IMPLANT
BUR MATCHSTICK NEURO 3.0 LAGG (BURR) IMPLANT
CARTRIDGE OIL MAESTRO DRILL (MISCELLANEOUS) ×1 IMPLANT
COLLAR CERV LO CONTOUR FIRM DE (SOFTGOODS) IMPLANT
CONT SPEC 4OZ CLIKSEAL STRL BL (MISCELLANEOUS) ×1 IMPLANT
CORDS BIPOLAR (ELECTRODE) ×2 IMPLANT
COVER SURGICAL LIGHT HANDLE (MISCELLANEOUS) ×2 IMPLANT
CRADLE DONUT ADULT HEAD (MISCELLANEOUS) ×2 IMPLANT
DECANTER SPIKE VIAL GLASS SM (MISCELLANEOUS) ×1 IMPLANT
DIFFUSER DRILL AIR PNEUMATIC (MISCELLANEOUS) ×2 IMPLANT
DRAIN JACKSON RD 7FR 3/32 (WOUND CARE) IMPLANT
DRAPE C-ARM 42X72 X-RAY (DRAPES) ×2 IMPLANT
DRAPE POUCH INSTRU U-SHP 10X18 (DRAPES) ×2 IMPLANT
DRAPE SURG 17X23 STRL (DRAPES) ×6 IMPLANT
DRILL BIT SKYLINE 14MM (BIT) ×2
DRILL NEURO 2X3.1 SOFT TOUCH (MISCELLANEOUS) ×2
DURAPREP 26ML APPLICATOR (WOUND CARE) ×2 IMPLANT
ELECT COATED BLADE 2.86 ST (ELECTRODE) ×2 IMPLANT
ELECT REM PT RETURN 9FT ADLT (ELECTROSURGICAL) ×2
ELECTRODE REM PT RTRN 9FT ADLT (ELECTROSURGICAL) ×1 IMPLANT
EVACUATOR SILICONE 100CC (DRAIN) IMPLANT
GAUZE SPONGE 4X4 12PLY STRL (GAUZE/BANDAGES/DRESSINGS) ×2 IMPLANT
GAUZE SPONGE 4X4 16PLY XRAY LF (GAUZE/BANDAGES/DRESSINGS) ×2 IMPLANT
GLOVE BIO SURGEON STRL SZ7 (GLOVE) ×2 IMPLANT
GLOVE BIO SURGEON STRL SZ8 (GLOVE) ×2 IMPLANT
GLOVE BIOGEL PI IND STRL 7.5 (GLOVE) ×2 IMPLANT
GLOVE BIOGEL PI IND STRL 8 (GLOVE) ×1 IMPLANT
GLOVE BIOGEL PI INDICATOR 7.5 (GLOVE) ×2
GLOVE BIOGEL PI INDICATOR 8 (GLOVE) ×1
GOWN STRL REUS W/ TWL LRG LVL3 (GOWN DISPOSABLE) ×1 IMPLANT
GOWN STRL REUS W/ TWL XL LVL3 (GOWN DISPOSABLE) ×1 IMPLANT
GOWN STRL REUS W/TWL LRG LVL3 (GOWN DISPOSABLE) ×2
GOWN STRL REUS W/TWL XL LVL3 (GOWN DISPOSABLE) ×2
INTERLOCK LRDTC CRVCL VBR 7MM (Bone Implant) IMPLANT
IV CATH 14GX2 1/4 (CATHETERS) ×2 IMPLANT
KIT BASIN OR (CUSTOM PROCEDURE TRAY) ×2 IMPLANT
KIT ROOM TURNOVER OR (KITS) ×2 IMPLANT
LORDOTIC CERVICAL VBR 7MM SM (Bone Implant) ×4 IMPLANT
MANIFOLD NEPTUNE II (INSTRUMENTS) ×2 IMPLANT
NDL SPNL 20GX3.5 QUINCKE YW (NEEDLE) ×1 IMPLANT
NEEDLE 27GAX1X1/2 (NEEDLE) ×2 IMPLANT
NEEDLE SPNL 20GX3.5 QUINCKE YW (NEEDLE) ×2 IMPLANT
NS IRRIG 1000ML POUR BTL (IV SOLUTION) ×2 IMPLANT
OIL CARTRIDGE MAESTRO DRILL (MISCELLANEOUS) ×2
PACK ORTHO CERVICAL (CUSTOM PROCEDURE TRAY) ×2 IMPLANT
PAD ARMBOARD 7.5X6 YLW CONV (MISCELLANEOUS) ×4 IMPLANT
PATTIES SURGICAL .5 X.5 (GAUZE/BANDAGES/DRESSINGS) IMPLANT
PATTIES SURGICAL .5 X1 (DISPOSABLE) IMPLANT
PIN DISTRACTION 14 (PIN) ×2 IMPLANT
PLATE TWO LEVEL SKYLINE 30MM (Plate) ×1 IMPLANT
PUTTY BONE DBX 5CC MIX (Putty) ×1 IMPLANT
SCREW SKYLINE VAR OS 14MM (Screw) ×6 IMPLANT
SPONGE INTESTINAL PEANUT (DISPOSABLE) ×2 IMPLANT
SPONGE SURGIFOAM ABS GEL 100 (HEMOSTASIS) ×2 IMPLANT
STRIP CLOSURE SKIN 1/2X4 (GAUZE/BANDAGES/DRESSINGS) ×2 IMPLANT
SURGIFLO W/THROMBIN 8M KIT (HEMOSTASIS) IMPLANT
SUT MNCRL AB 4-0 PS2 18 (SUTURE) ×2 IMPLANT
SUT SILK 4 0 (SUTURE)
SUT SILK 4-0 18XBRD TIE 12 (SUTURE) IMPLANT
SUT VIC AB 2-0 CT2 18 VCP726D (SUTURE) ×2 IMPLANT
SYR BULB IRRIGATION 50ML (SYRINGE) ×2 IMPLANT
SYR CONTROL 10ML LL (SYRINGE) ×4 IMPLANT
TAPE CLOTH 4X10 WHT NS (GAUZE/BANDAGES/DRESSINGS) ×2 IMPLANT
TAPE UMBILICAL COTTON 1/8X30 (MISCELLANEOUS) ×2 IMPLANT
TOWEL OR 17X24 6PK STRL BLUE (TOWEL DISPOSABLE) ×2 IMPLANT
TOWEL OR 17X26 10 PK STRL BLUE (TOWEL DISPOSABLE) ×2 IMPLANT
WATER STERILE IRR 1000ML POUR (IV SOLUTION) ×2 IMPLANT
YANKAUER SUCT BULB TIP NO VENT (SUCTIONS) ×2 IMPLANT

## 2015-09-19 NOTE — Progress Notes (Signed)
Utilization review completed.  

## 2015-09-19 NOTE — Anesthesia Procedure Notes (Signed)
Procedure Name: Intubation Date/Time: 09/19/2015 7:41 AM Performed by: Scheryl Darter Pre-anesthesia Checklist: Patient identified, Emergency Drugs available, Suction available, Patient being monitored and Timeout performed Patient Re-evaluated:Patient Re-evaluated prior to inductionOxygen Delivery Method: Circle system utilized Preoxygenation: Pre-oxygenation with 100% oxygen Intubation Type: IV induction Ventilation: Mask ventilation without difficulty Laryngoscope Size: Miller and 2 Grade View: Grade I Tube type: Oral Tube size: 7.0 mm Number of attempts: 1 Airway Equipment and Method: Stylet Placement Confirmation: ETT inserted through vocal cords under direct vision,  positive ETCO2 and breath sounds checked- equal and bilateral Secured at: 21 cm Tube secured with: Tape Dental Injury: Teeth and Oropharynx as per pre-operative assessment

## 2015-09-19 NOTE — Anesthesia Postprocedure Evaluation (Signed)
Anesthesia Post Note  Patient: Alyssa Schultz  Procedure(s) Performed: Procedure(s) (LRB): ANTERIOR CERVICAL DECOMPRESSION/DISCECTOMY FUSION 2 LEVELS (N/A)  Patient location during evaluation: PACU Anesthesia Type: General Level of consciousness: awake and alert Pain management: pain level controlled Vital Signs Assessment: post-procedure vital signs reviewed and stable Respiratory status: spontaneous breathing, nonlabored ventilation and respiratory function stable Cardiovascular status: blood pressure returned to baseline and stable Postop Assessment: no signs of nausea or vomiting Anesthetic complications: no    Last Vitals:  Filed Vitals:   09/19/15 1207 09/19/15 1223  BP: 108/70 110/63  Pulse: 70 77  Temp: 36.6 C 36.5 C  Resp: 7 16    Last Pain:  Filed Vitals:   09/19/15 1327  PainSc: 3                  Harrison Paulson,W. EDMOND

## 2015-09-19 NOTE — Transfer of Care (Signed)
Immediate Anesthesia Transfer of Care Note  Patient: Alyssa Schultz  Procedure(s) Performed: Procedure(s) with comments: ANTERIOR CERVICAL DECOMPRESSION/DISCECTOMY FUSION 2 LEVELS (N/A) - Anterior cervical decompression fusion, cervical 3-4, cervical 4-5 decompression with instrumentation and allograft  Patient Location: PACU  Anesthesia Type:General  Level of Consciousness: awake, alert , oriented and sedated  Airway & Oxygen Therapy: Patient Spontanous Breathing and Patient connected to nasal cannula oxygen  Post-op Assessment: Report given to RN, Post -op Vital signs reviewed and stable and Patient moving all extremities  Post vital signs: Reviewed and stable  Last Vitals:  Filed Vitals:   09/19/15 0625  BP: 113/83  Pulse: 74  Temp: 36.3 C  Resp: 20    Complications: No apparent anesthesia complications

## 2015-09-19 NOTE — Op Note (Signed)
NAME:  Alyssa Schultz, Alyssa Schultz NO.:  [id number]  MEDICAL RECORD NO.:  [id number]  LOCATION:  [facility]                         FACILITY:  [facility]  PHYSICIAN:  Phylliss Bob, MD      DATE OF BIRTH:  [birth year]  DATE OF PROCEDURE:  09/19/2015                              OPERATIVE REPORT   PREOPERATIVE DIAGNOSES: 1. 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 2. Cervical myelomalacia. 3. Left-sided cervical radiculopathy. 4. Physical exam and MRI findings associated with cervical myelopathy.  POSTOPERATIVE DIAGNOSES: 1. 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 2. Cervical myelomalacia. 3. Left-sided cervical radiculopathy. 4. Physical exam and MRI findings associated with cervical myelopathy.  PROCEDURE: 1. Anterior cervical decompression and fusion C3-4, C4-5. 2. Placement of anterior instrumentation, C3 to C5. 3. Insertion of interbody device x2 (Titan intervertebral spacers). 4. Use of morselized allograft-DBX mix. 5. Intraoperative use of fluoroscopy.  SURGEON:  Phylliss Bob, MD  ASSISTANT:  Pricilla Holm, PA-C  ANESTHESIA:  General endotracheal anesthesia.  COMPLICATIONS:  None.  DISPOSITION:  Stable.  ESTIMATED BLOOD LOSS:  Minimal.  INDICATIONS FOR SURGERY:  Briefly, Alyssa Schultz is a very pleasant 50 year old female, who did initially present to me on August 23, 2015, with a 6- week history of pain in the left arm.  The patient's pain was noted to be rather severe.  I did review an MRI with the patient, which was clearly notable for myelomalacia associated with a C3-4 and C4-5 levels, in addition to prominent spinal cord compression at C4-5, with minimal spinal cord compression noted at C3-4.  There was also left-sided neuroforaminal stenosis noted at C4-5 greater than in C3-4.  Given the patient's physical exam and history and MRI findings, we did discuss proceeding with an anterior cervical decompression and fusion at the levels noted above.  The patient was fully  aware of the risks and limitations of the procedure as outlined in my preoperative note.  OPERATIVE DETAILS:  On September 19, 2015, patient was brought to surgery and general endotracheal anesthesia was administered.  The patient was placed supine on the hospital bed.  The patient's neck was maintained in the neutral position, given the compression of the spinal cord noted on the MRI.  The patient's arms were secured to her sides.  All bony prominences were padded.  The neck was prepped and draped and a time-out was performed.  A left-sided transverse incision was made.  The platysma was incised.  A Smith-Robinson approach was used.  The vertebral bodies from C3-C5 were subperiosteally exposed.  Caspar distractor pins were placed into the C4 and C5 vertebral bodies and distraction was applied. Of note, self-retaining retractor was placed prior to placing the Caspar pins.  I then performed a thorough and complete C4-5 intervertebral diskectomy.  There was a very prominent calcified aspect of the anulus and posterior longitudinal ligament noted posteriorly.  I was able to meticulously remove the prominence of the level of the intervertebral space.  There was additional prominence noted upward, behind the C4 vertebral body.  This was also removed then in a safe manner.  There were some very minor calcifications that did remain immediately posterior to the C4 vertebral body.  I did feel that any additional attempts at removing these would bring with it increased risk of spinal cord injury.  However, I did feel that the spinal cord was adequately decompressed.  The endplates were then prepared and the appropriate size interbody spacer was packed with DBX mix and tamped into position.  The lower Caspar pin was removed and bone wax was placed in its place.  A new Caspar pin was then placed into the C3 vertebral body and again, distraction was applied across the intervertebral space.  Once again,  a thorough and complete central and bilateral neuroforaminal decompression was performed and confirmed using a nerve hook.  The endplates were prepared and the appropriate size interbody spacer was packed with DBX mix and tamped into position.  I was very pleased with the AP and lateral fluoroscopic images.  I then chose the appropriate-sized anterior cervical plate, which was placed over the anterior spine.  A 14 mm variable angle screws were placed, 2 in each vertebral body from C3 and C5 for a total of 6 vertebral body screws.  These screws were then locked to the plate using the CAM locking mechanism.  The wound was then copiously irrigated.  I was very pleased with the final AP and lateral fluoroscopic images.  The platysma was then closed with 2-0 Vicryl and the skin was closed using 3-0 Monocryl.  Benzoin and Steri-Strips were applied followed by sterile dressing.  The patient was then awoken from general endotracheal anesthesia and transferred to recovery in stable condition.  Of note, Pricilla Holm was my assistant throughout surgery, and did aid in retraction, suctioning, and closure from start to finish.     Phylliss Bob, MD     MD/MEDQ  D:  09/19/2015  T:  09/19/2015  Job:  [id number]

## 2015-09-20 ENCOUNTER — Encounter (HOSPITAL_COMMUNITY): Payer: Self-pay | Admitting: Orthopedic Surgery

## 2015-09-20 LAB — GLUCOSE, CAPILLARY: GLUCOSE-CAPILLARY: 174 mg/dL — AB (ref 65–99)

## 2015-09-20 MED FILL — Thrombin For Soln 20000 Unit: CUTANEOUS | Qty: 1 | Status: AC

## 2015-09-20 NOTE — Progress Notes (Signed)
Patient alert and oriented, mae's well, voiding adequate amount of urine, swallowing without difficulty, no c/o pain. Patient discharged home with family. Script and discharged instructions given to patient. Patient and family stated understanding of d/c instructions given and has an appointment with MD. 

## 2015-09-20 NOTE — Progress Notes (Signed)
    Patient doing well day 1 PO C3-5 ACDF with resolved pre-op L arm pain and myelopathy symptomatology. Reports neck pain and stiffness and throat soreness. Pain medications help. She has been up and walking.   Physical Exam: BP 103/75 mmHg  Pulse 75  Temp(Src) 98.3 F (36.8 C) (Oral)  Resp 18  SpO2 97%  Dressing in place, neck soft and supple, collar in place and worn appropriately. Pt sitting up comfortably.  NVI  POD #1 s/p C3-5 ACDF for L radiculopathy and myelopathy  - Resolved pre-op arm pain and symptoms  - Encourage ambulation - Percocet for pain, Valium for muscle spasms  -Scripts signed in chart - D/C instructions printed in chart  - likely d/c home today

## 2015-09-20 NOTE — Progress Notes (Signed)
Orthopedic Tech Progress Note Patient Details:  Alyssa Schultz 17-May-1966 [id number]  Ortho Devices Type of Ortho Device: [place] cervical collar Ortho Device/Splint Interventions: Application   [name] 09/20/2015, 12:47 PM

## 2015-09-23 NOTE — Discharge Summary (Signed)
Patient ID: Alyssa Schultz MRN: [id number] DOB/AGE: 11-24-1965 50 y.o.  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 Hypertension   . Hyperlipidemia   . Diabetes mellitus     dx 2014   just take pills    Surgeries: Procedure(s): ANTERIOR CERVICAL DECOMPRESSION/DISCECTOMY FUSION 2 LEVELS C3-5 on 09/19/2015   Consultants:  None  Discharged Condition: Improved  Hospital Course: Alyssa Schultz is an 50 y.o. female who was admitted 09/19/2015 for operative treatment of myeloradiculopathy. Patient has severe unremitting pain that affects sleep, daily activities, and work/hobbies. After pre-op clearance the patient was taken to the operating room on 09/19/2015 and underwent  Procedure(s): ANTERIOR CERVICAL DECOMPRESSION/DISCECTOMY FUSION 2 LEVELS C3-5.    Patient was given perioperative antibiotics:  Anti-infectives    Start     Dose/Rate Route Frequency Ordered Stop   09/19/15 1600  ceFAZolin (ANCEF) IVPB 1 g/50 mL premix     1 g 100 mL/hr over 30 Minutes Intravenous Every 8 hours 09/19/15 1255 09/19/15 2335   09/19/15 1300  metroNIDAZOLE (FLAGYL) tablet 500 mg  Status:  Discontinued     500 mg Oral 2 times daily 09/19/15 1254 09/19/15 1301   09/19/15 1259  fluconazole (DIFLUCAN) tablet 100 mg  Status:  Discontinued     100 mg Oral Daily PRN 09/19/15 1254 09/20/15 1631   09/19/15 0600  ceFAZolin (ANCEF) IVPB 2 g/50 mL premix     2 g 100 mL/hr over 30 Minutes Intravenous On call to O.R. 09/18/15 1236 09/19/15 0744       Patient was given sequential compression devices, early ambulation to prevent DVT.  Patient benefited maximally from hospital stay and there were no complications.    Recent vital signs: BP 129/84 mmHg  Pulse 101  Temp(Src) 98.2 F (36.8 C) (Oral)  Resp 16  SpO2 98%  Discharge  Medications:     Medication List    STOP taking these medications        predniSONE 20 MG tablet  Commonly known as:  DELTASONE      TAKE these medications        B-D ULTRA-FINE 33 LANCETS Misc  Use as directed     fluconazole 100 MG tablet  Commonly known as:  DIFLUCAN  Take 100 mg by mouth daily as needed. Reported on 09/13/2015     gabapentin 300 MG capsule  Commonly known as:  NEURONTIN  Take 1 capsule (300 mg total) by mouth 3 (three) times daily.     gabapentin 100 MG capsule  Commonly known as:  NEURONTIN  Take 1 capsule (100 mg total) by mouth 2 (two) times daily.     glucose blood test strip  Patient is testing at home 1-2 times daily.     glucose blood test strip  Use as instructed     hydrochlorothiazide 25 MG tablet  Commonly known as:  HYDRODIURIL  TAKE 1 TABLET (25 MG TOTAL) BY MOUTH DAILY.     lisinopril 10 MG tablet  Commonly known as:  PRINIVIL,ZESTRIL  TAKE ONE TABLET BY MOUTH DAILY     lubiprostone 24 MCG capsule  Commonly known as:  AMITIZA  Take 1 capsule (24 mcg total) by mouth daily with breakfast.     meclizine 50 MG tablet  Commonly known as:  ANTIVERT  Take  1 tablet (50 mg total) by mouth 3 (three) times daily as needed for dizziness.     metFORMIN 500 MG tablet  Commonly known as:  GLUCOPHAGE  TAKE 1 TABLET BY MOUTH 2 TIMES DAILY WITH A MEAL.     metroNIDAZOLE 500 MG tablet  Commonly known as:  FLAGYL  Take 1 tablet (500 mg total) by mouth 2 (two) times daily.     ONGLYZA 2.5 MG Tabs tablet  Generic drug:  saxagliptin HCl  TAKE 1 TABLET (2.5 MG TOTAL) BY MOUTH DAILY.        Diagnostic Studies: Dg Cervical Spine 1 View  09/19/2015  CLINICAL DATA:  Lateral intraoperative view from ACDF at C3-4 and C4-5; reported fluoro time 7 seconds. EXAM: DG C-ARM 61-120 MIN; DG CERVICAL SPINE - 1 VIEW COMPARISON:  MR of the cervical spine of August 08, 2015 FINDINGS: The patient has undergone placement of cortical screws and metallic  sideplate across the 075-GRM and C4-5 discs. Intradiscal devices are present at both disc levels. The trachea is intubated. A surgical sponge is present in the operative wound anterior to the hardware. IMPRESSION: Intraoperative image revealing ACDF at C3-4 and C4-5. No immediate complication is observed. Electronically Signed   By: Alyssa  Schultz M.D.   On: 09/19/2015 12:51   Dg C-arm 61-120 Min  09/19/2015  CLINICAL DATA:  Lateral intraoperative view from ACDF at C3-4 and C4-5; reported fluoro time 7 seconds. EXAM: DG C-ARM 61-120 MIN; DG CERVICAL SPINE - 1 VIEW COMPARISON:  MR of the cervical spine of August 08, 2015 FINDINGS: The patient has undergone placement of cortical screws and metallic sideplate across the 075-GRM and C4-5 discs. Intradiscal devices are present at both disc levels. The trachea is intubated. A surgical sponge is present in the operative wound anterior to the hardware. IMPRESSION: Intraoperative image revealing ACDF at C3-4 and C4-5. No immediate complication is observed. Electronically Signed   By: Alyssa  Schultz M.D.   On: 09/19/2015 12:51    Disposition: 01-Home or Self Care   POD #1 s/p C3-5 ACDF for L radiculopathy and myelopathy  - Resolved pre-op arm pain and symptoms  - Encourage ambulation - Percocet for pain, Valium for muscle spasms -Scripts signed in chart -D/C instructions sheet printed and in chart -D/C today  -F/U in office 2 weeks   Signed: Justice Schultz 09/23/2015, 5:15 PM

## 2015-09-24 ENCOUNTER — Other Ambulatory Visit: Payer: Self-pay

## 2015-09-24 NOTE — Patient Outreach (Signed)
[name]) Care Management  09/24/2015  [name] March 03, 1966 [id number]   Spoke to Alyssa Schultz post spinal surgery last week, Feb. 2, 2017.  She reports that she was discharged on 09/20/15 and " I don't feel bad".  She tells me she was told to drink only clear liquids and she tells me her throat is still sore.  She is taking in sherbet and sugar free fluids plus chicken broth.  Her mother is staying with her and [place] was able to go out for a walk with her yesterday.  She is using limited pain medication.   Ger reports elevated fasting blood sugars 155mg /dl this am and before lunch blood sugar today of 114mg /dl.  She reports these are typical numbers.  She will continue to check sugars a minimum of 2x/day and call if she has elevations or questions.  She will follow up with her MD next week.    [name], RN, BA, [place], [place] Direct Dial:  [phone]  Fax:  [phone] E-mail: [email or web address] [address] [place], [place]  [address]

## 2015-09-25 ENCOUNTER — Ambulatory Visit: Payer: Self-pay

## 2015-09-30 ENCOUNTER — Other Ambulatory Visit: Payer: Self-pay

## 2015-09-30 NOTE — Patient Outreach (Signed)
[facility]) Care Management  09/30/2015  Alyssa Schultz [birth year] [id number]  [facility] sent me a message this am.  Her fasting blood sugars are 201-106mg /dl, before lunch 85-92mg /dl .  She is feeling good post surgery and recognizes that work stress and pre-surgery pain has been impacting her blood sugars.   [name], RN, BA, [place], [place] Direct Dial:  [phone]  Fax:  [phone] E-mail: [email or web address] [address] [place], [place]  [address]

## 2015-10-02 DIAGNOSIS — M5412 Radiculopathy, cervical region: Secondary | ICD-10-CM | POA: Diagnosis not present

## 2015-10-07 ENCOUNTER — Other Ambulatory Visit: Payer: Self-pay | Admitting: Internal Medicine

## 2015-10-10 DIAGNOSIS — H5213 Myopia, bilateral: Secondary | ICD-10-CM | POA: Diagnosis not present

## 2015-10-10 DIAGNOSIS — E119 Type 2 diabetes mellitus without complications: Secondary | ICD-10-CM | POA: Diagnosis not present

## 2015-10-12 ENCOUNTER — Ambulatory Visit
Admission: EM | Admit: 2015-10-12 | Discharge: 2015-10-12 | Disposition: A | Discharge status: Home / Self Care | Payer: 59 | Attending: Family Medicine | Admitting: Family Medicine

## 2015-10-12 ENCOUNTER — Encounter: Payer: Self-pay | Admitting: Gynecology

## 2015-10-12 DIAGNOSIS — R300 Dysuria: Secondary | ICD-10-CM | POA: Diagnosis not present

## 2015-10-12 LAB — URINALYSIS COMPLETE WITH MICROSCOPIC (ARMC ONLY)
Bilirubin Urine: NEGATIVE
Glucose, UA: NEGATIVE mg/dL
Hgb urine dipstick: NEGATIVE
KETONES UR: NEGATIVE mg/dL
LEUKOCYTES UA: NEGATIVE
Nitrite: NEGATIVE
PH: 6.5 (ref 5.0–8.0)
PROTEIN: NEGATIVE mg/dL
RBC / HPF: NONE SEEN RBC/hpf (ref 0–5)
SPECIFIC GRAVITY, URINE: 1.01 (ref 1.005–1.030)

## 2015-10-12 MED ORDER — FLUCONAZOLE 150 MG PO TABS: 150.0000 mg | ORAL_TABLET | Freq: Every day | ORAL | Status: DC

## 2015-10-12 MED ORDER — CIPROFLOXACIN HCL 500 MG PO TABS: 500.0000 mg | ORAL_TABLET | Freq: Two times a day (BID) | ORAL | Status: DC

## 2015-10-12 NOTE — ED Provider Notes (Signed)
CSN: [id number]     Arrival date & time 10/12/15  0820 History   None    Chief Complaint  Patient presents with  . Urinary Tract Infection   (Consider location/radiation/quality/duration/timing/severity/associated sxs/prior Treatment) Patient is a 50 y.o. female presenting with dysuria. The history is provided by the patient.  Dysuria Pain quality:  Aching Pain severity:  Mild Onset quality:  Sudden Timing:  Constant Progression:  Unchanged Chronicity:  New Recent urinary tract infections: no   Relieved by:  None tried Urinary symptoms: no discolored urine, no foul-smelling urine, no frequent urination, no hematuria, no hesitancy and no bladder incontinence   Associated symptoms: flank pain   Associated symptoms: no abdominal pain, no fever, no genital lesions, no nausea, no vaginal discharge and no vomiting   Risk factors: no hx of pyelonephritis, no hx of urolithiasis, no kidney transplant, not pregnant, no recurrent urinary tract infections, no renal cysts, no renal disease, not single kidney, no sexually transmitted infections and no urinary catheter   Risk factors comment:  Diabetes   Past Medical History  Diagnosis Date  . Hypertension   . Hyperlipidemia   . Diabetes mellitus     dx 2014   just take pills   Past Surgical History  Procedure Laterality Date  . Facial reconstruction surgery    . Ablation    . Anterior cervical decomp/discectomy fusion N/A 09/19/2015    Procedure: ANTERIOR CERVICAL DECOMPRESSION/DISCECTOMY FUSION 2 LEVELS;  Surgeon: Phylliss Bob, MD;  Location: [facility];  Service: Orthopedics;  Laterality: N/A;  Anterior cervical decompression fusion, cervical 3-4, cervical 4-5 decompression with instrumentation and allograft   Family History  Problem Relation Age of Onset  . Diabetes Mother   . Breast cancer Neg Hx   . Diabetes Brother    Social History  Substance Use Topics  . Smoking status: Never Smoker   . Smokeless tobacco: None  . Alcohol Use:  Yes     Comment: Occasional wine   OB History    No data available     Review of Systems  Constitutional: Negative for fever.  Gastrointestinal: Negative for nausea, vomiting and abdominal pain.  Genitourinary: Positive for dysuria and flank pain. Negative for vaginal discharge.    Allergies  Influenza vaccine live; Morphine and related; Prednisone; and Hydrocodone  Home Medications   Prior to Admission medications   Medication Sig Start Date End Date Taking? Authorizing Provider  B-D ULTRA-FINE 33 LANCETS MISC Use as directed 09/17/14  Yes Jackolyn Confer, MD  gabapentin (NEURONTIN) 100 MG capsule Take 1 capsule (100 mg total) by mouth 2 (two) times daily. 08/20/15  Yes Jackolyn Confer, MD  gabapentin (NEURONTIN) 300 MG capsule Take 1 capsule (300 mg total) by mouth 3 (three) times daily. Patient taking differently: Take 300 mg by mouth at bedtime.  07/23/15  Yes Jackolyn Confer, MD  glucose blood test strip Patient is testing at home 1-2 times daily. 02/23/13  Yes Jackolyn Confer, MD  glucose blood test strip Use as instructed 09/16/15  Yes Versie Starks, PA-C  hydrochlorothiazide (HYDRODIURIL) 25 MG tablet TAKE 1 TABLET (25 MG TOTAL) BY MOUTH DAILY. 09/02/15  Yes Jackolyn Confer, MD  lisinopril (PRINIVIL,ZESTRIL) 10 MG tablet TAKE ONE TABLET BY MOUTH DAILY 04/23/15  Yes Jackolyn Confer, MD  lubiprostone (AMITIZA) 24 MCG capsule Take 1 capsule (24 mcg total) by mouth daily with breakfast. 10/31/14  Yes Jackolyn Confer, MD  meclizine (ANTIVERT) 50 MG tablet Take 1  tablet (50 mg total) by mouth 3 (three) times daily as needed for dizziness. 01/06/15  Yes Lorin Picket, PA-C  metFORMIN (GLUCOPHAGE) 500 MG tablet TAKE 1 TABLET BY MOUTH 2 TIMES DAILY WITH A MEAL. 10/07/15  Yes Jackolyn Confer, MD  metroNIDAZOLE (FLAGYL) 500 MG tablet Take 1 tablet (500 mg total) by mouth 2 (two) times daily. 11/07/14  Yes Jackolyn Confer, MD  ONGLYZA 2.5 MG TABS tablet TAKE 1 TABLET (2.5 MG  TOTAL) BY MOUTH DAILY. 04/08/15  Yes Jackolyn Confer, MD  ciprofloxacin (CIPRO) 500 MG tablet Take 1 tablet (500 mg total) by mouth every 12 (twelve) hours. 10/12/15   Norval Gable, MD  fluconazole (DIFLUCAN) 150 MG tablet Take 1 tablet (150 mg total) by mouth daily. 10/12/15   Norval Gable, MD   Meds Ordered and Administered this Visit  Medications - No data to display  BP 114/78 mmHg  Pulse 74  Temp(Src) 97 F (36.1 C) (Oral)  Resp 16  Ht 5\' 5"  (1.651 m)  Wt 182 lb (82.555 kg)  BMI 30.29 kg/m2  SpO2 100% No data found.   Physical Exam  Constitutional: She appears well-developed and well-nourished. No distress.  Abdominal: Soft. Bowel sounds are normal. She exhibits no distension and no mass. There is no tenderness. There is no rebound and no guarding.  Skin: She is not diaphoretic.  Nursing note and vitals reviewed.   ED Course  Procedures (including critical care time)  Labs Review Labs Reviewed  URINALYSIS COMPLETEWITH MICROSCOPIC ([facility] ONLY) - Abnormal; Notable for the following:    Color, Urine STRAW (*)    Bacteria, UA RARE (*)    Squamous Epithelial / LPF 0-5 (*)    All other components within normal limits  URINE CULTURE    Imaging Review No results found.   Visual Acuity Review  Right Eye Distance:   Left Eye Distance:   Bilateral Distance:    Right Eye Near:   Left Eye Near:    Bilateral Near:         MDM   1. Dysuria    Discharge Medication List as of 10/12/2015  9:51 AM    START taking these medications   Details  ciprofloxacin (CIPRO) 500 MG tablet Take 1 tablet (500 mg total) by mouth every 12 (twelve) hours., Starting 10/12/2015, Until Discontinued, Normal       1. Lab results and diagnosis reviewed with patient 2. rx as per orders above; reviewed possible side effects, interactions, risks and benefits  3. Recommend supportive treatment with increased water intake 4. Follow-up prn if symptoms worsen or don't improve      Norval Gable, MD 10/12/15 [id number]

## 2015-10-12 NOTE — ED Notes (Signed)
Patient c/o lower back pain.

## 2015-10-14 LAB — URINE CULTURE: Culture: 3000

## 2015-10-23 ENCOUNTER — Other Ambulatory Visit: Payer: Self-pay

## 2015-10-23 NOTE — Patient Outreach (Signed)
[facility]) Care Management  10/23/2015  Alyssa Schultz [birth year] [id number]  Spoke to patient by phone today expecting to get her at home.  She tells me she returned to work last week and instead of returning for 4 hours each day, her note said 4-8 hours and the first day back she worked 8 hours and felt miserable.  Tells me her blood sugars were perfect when she was home but now that she has returned to work, her blood sugars have been very elevated.  She was unable to talk longer this morning but will give me an update after she sees her physician on Friday, October 25, 2015.   [name], RN, BA, [place], [place] Direct Dial:  [phone]  Fax:  [phone] E-mail: [email or web address] [address], [place], [place]  [address]

## 2015-10-25 DIAGNOSIS — M5412 Radiculopathy, cervical region: Secondary | ICD-10-CM | POA: Diagnosis not present

## 2015-11-01 ENCOUNTER — Encounter: Payer: Self-pay | Admitting: Internal Medicine

## 2015-11-06 ENCOUNTER — Other Ambulatory Visit: Payer: Self-pay | Admitting: Internal Medicine

## 2015-11-07 NOTE — Telephone Encounter (Signed)
Med refill approved 

## 2015-11-18 ENCOUNTER — Other Ambulatory Visit: Payer: Self-pay | Admitting: Podiatry

## 2015-11-19 ENCOUNTER — Telehealth: Payer: Self-pay | Admitting: Internal Medicine

## 2015-11-19 MED ORDER — MELOXICAM 15 MG PO TABS: 15.0000 mg | ORAL_TABLET | Freq: Every day | ORAL | Status: DC

## 2015-11-19 NOTE — Telephone Encounter (Signed)
I don't see this on her medication list.  I do see that another provider denied this at another office today( Dr.Hyatt).  Please advise?

## 2015-11-19 NOTE — Telephone Encounter (Signed)
Refilled times one month

## 2015-11-19 NOTE — Telephone Encounter (Signed)
Fine to refill x 1 month. 

## 2015-11-19 NOTE — Addendum Note (Signed)
Addended by: Bevelyn Ngo on: 11/19/2015 02:23 PM   Modules accepted: Orders

## 2015-11-19 NOTE — Telephone Encounter (Signed)
Pt is requesting a refill on meloxicam - it helps with her hip pain.Marland Kitchen Please advise pt

## 2015-11-20 ENCOUNTER — Other Ambulatory Visit: Payer: Self-pay
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
## 2015-11-22 ENCOUNTER — Other Ambulatory Visit: Payer: Self-pay | Admitting: Internal Medicine

## 2015-12-10 ENCOUNTER — Other Ambulatory Visit: Payer: Self-pay

## 2015-12-10 NOTE — Patient Outreach (Signed)
[facility]) Care Management  12/10/2015  [name] 06-08-1966 [id number]   I have called [name] and left a message to schedule a May.  She promptly called back and has scheduled her visit to 01/15/16.  "I haven't been good so I can't come the first 2 weeks of May".  She tells me she is having a scan on her neck on Dec 20, 2015.    [name], RN, BA, [place], [place] Direct Dial:  [phone]  Fax:  [phone] E-mail: [email or web address] [address] [place], [place]  [address]

## 2015-12-20 DIAGNOSIS — M542 Cervicalgia: Secondary | ICD-10-CM | POA: Diagnosis not present

## 2015-12-24 ENCOUNTER — Ambulatory Visit: Payer: Self-pay

## 2016-01-03 ENCOUNTER — Telehealth: Payer: Self-pay | Admitting: Internal Medicine

## 2016-01-03 ENCOUNTER — Encounter: Payer: Self-pay | Admitting: Physician Assistant

## 2016-01-03 ENCOUNTER — Ambulatory Visit: Payer: Self-pay | Admitting: Physician Assistant

## 2016-01-03 VITALS — BP 120/80 | HR 80 | Temp 98.3°F

## 2016-01-03 DIAGNOSIS — R300 Dysuria: Secondary | ICD-10-CM

## 2016-01-03 LAB — POCT URINALYSIS DIPSTICK
BILIRUBIN UA: NEGATIVE
Glucose, UA: NEGATIVE
Ketones, UA: NEGATIVE
LEUKOCYTES UA: NEGATIVE
NITRITE UA: NEGATIVE
PH UA: 5.5
PROTEIN UA: NEGATIVE
RBC UA: NEGATIVE
Spec Grav, UA: 1.01
UROBILINOGEN UA: 0.2

## 2016-01-03 MED ORDER — METRONIDAZOLE 500 MG PO TABS: 500.0000 mg | ORAL_TABLET | Freq: Two times a day (BID) | ORAL | Status: DC

## 2016-01-03 MED ORDER — FLUCONAZOLE 150 MG PO TABS: 150.0000 mg | ORAL_TABLET | Freq: Once | ORAL | Status: DC

## 2016-01-03 NOTE — Telephone Encounter (Signed)
Patient states it is fishy smell in her vaginal area.  Please advise.

## 2016-01-03 NOTE — Progress Notes (Signed)
   Subjective:UTI    Patient ID: Alyssa Schultz, female    DOB: April 12, 1966, 50 y.o.   MRN: [id number]  HPI Patient c/o foul vaginal odor and urinary frequency for 2 days. Patient denies vaginal discharge, flank pain, or fever.  Unable to see GYN Doctor for 2 weeks.   Review of Systems HTN and DM.    Objective:   Physical Exam: Defetrred Negative urine Dipstick test.        Assessment & Plan:Bacterial Vaginosis  Flagyl and Diflucan.  Follow up with GYN Doctor.

## 2016-01-03 NOTE — Telephone Encounter (Signed)
Pt has a virgina smell and was wondering if Dr. Gilford Rile could give her something again for it. Please call work number.

## 2016-01-03 NOTE — Telephone Encounter (Signed)
Patient needs an appt, please call and schedule, thanks

## 2016-01-03 NOTE — Telephone Encounter (Signed)
Needs to be seen

## 2016-01-14 ENCOUNTER — Other Ambulatory Visit: Payer: Self-pay

## 2016-01-14 DIAGNOSIS — E119 Type 2 diabetes mellitus without complications: Secondary | ICD-10-CM

## 2016-01-14 NOTE — Patient Outreach (Signed)
[facility] Care Management  [place]  01/14/2016   Alyssa Schultz [birth year] [id number]  Subjective: Patient in to see me- she has not brought her meter with her but reports fasting blood sugar 159-196 mg/dl and pre-lunch and supper 95-135 mg/dl.  She reports no pain or numbness in her left arm post surgery. She continues to take Mobic for left hip pain- she has no pain in my office today. She reports that she's skipping meals, often times supper because she's not hungry after she exercises. She denies any hypoglycemia.    Encounter Medications:  Outpatient Encounter Prescriptions as of 01/14/2016  Medication Sig  . B-D ULTRA-FINE 33 LANCETS MISC Use as directed  . glucose blood test strip Use as instructed  . hydrochlorothiazide (HYDRODIURIL) 25 MG tablet TAKE 1 TABLET (25 MG TOTAL) BY MOUTH DAILY.  Marland Kitchen lisinopril (PRINIVIL,ZESTRIL) 10 MG tablet TAKE 1 TABLET BY MOUTH ONCE DAILY  . meclizine (ANTIVERT) 50 MG tablet Take 1 tablet (50 mg total) by mouth 3 (three) times daily as needed for dizziness.  . meloxicam (MOBIC) 15 MG tablet Take 1 tablet (15 mg total) by mouth daily.  . metFORMIN (GLUCOPHAGE) 500 MG tablet TAKE 1 TABLET BY MOUTH 2 TIMES DAILY WITH A MEAL.  Marland Kitchen ONGLYZA 2.5 MG TABS tablet TAKE 1 TABLET (2.5 MG TOTAL) BY MOUTH DAILY.  Marland Kitchen TRUE METRIX BLOOD GLUCOSE TEST test strip CHECK BLOOD SUGAR TWICE A DAY AND AS NEEDED  . ciprofloxacin (CIPRO) 500 MG tablet Take 1 tablet (500 mg total) by mouth every 12 (twelve) hours. (Patient not taking: Reported on 01/03/2016)  . fluconazole (DIFLUCAN) 150 MG tablet Take 1 tablet (150 mg total) by mouth daily. (Patient not taking: Reported on 01/03/2016)  . fluconazole (DIFLUCAN) 150 MG tablet Take 1 tablet (150 mg total) by mouth once. (Patient not taking: Reported on 01/14/2016)  . gabapentin (NEURONTIN) 100 MG capsule Take 1 capsule (100 mg total) by mouth 2 (two) times daily. (Patient not taking: Reported on 01/14/2016)  .  gabapentin (NEURONTIN) 300 MG capsule Take 1 capsule (300 mg total) by mouth 3 (three) times daily. (Patient not taking: Reported on 01/14/2016)  . lubiprostone (AMITIZA) 24 MCG capsule Take 1 capsule (24 mcg total) by mouth daily with breakfast. (Patient not taking: Reported on 01/03/2016)  . metroNIDAZOLE (FLAGYL) 500 MG tablet Take 1 tablet (500 mg total) by mouth 2 (two) times daily. (Patient not taking: Reported on 01/03/2016)  . metroNIDAZOLE (FLAGYL) 500 MG tablet Take 1 tablet (500 mg total) by mouth 2 (two) times daily. (Patient not taking: Reported on 01/14/2016)   No facility-administered encounter medications on file as of 01/14/2016.    Functional Status:  In your present state of health, do you have any difficulty performing the following activities: 01/14/2016 09/06/2015  Hearing? N N  Vision? N N  Difficulty concentrating or making decisions? N N  Walking or climbing stairs? N N  Dressing or bathing? N N  Doing errands, shopping? N N    Fall/Depression Screening: PHQ 2/9 Scores 01/14/2016 09/13/2015  PHQ - 2 Score 0 0    Assessment: Alyssa Schultz has increased her ambulation since having her surgery.  She has no complaints. Fasting blood sugars elevated- likely a result of poor/no dietary intake in the evening.   Plan:  [facility] CM Care Plan Problem One        Most Recent Value   Care Plan Problem One  Potential for elevated blood sugars as a result of  pain and surgery (September 19, 2015)   Role Documenting the Problem One  Care Management [place] for Problem One  Not Active   [facility] Long Term Goal (31-90 days)  Patient will maintain blood sugars near pre- surgery levels.    [facility] Long Term Goal Start Date  09/13/15   [facility] Long Term Goal Met Date  01/14/16   Interventions for Problem One Long Term Goal  1. Speak to MD/surgeon about steroid use 2. Don't refuse insulin in hospital if it's needed to control sugars 3. check blood sugars  2x/day post surgery, more if it's elevated  4.  Call Dr. Gilford Rile if sugars stay elevated post surgery  5. take pain med as ordered    [facility] CM Care Plan Problem Two        Most Recent Value   Care Plan Problem Two  Elevated A1C at 7.4%   Role Documenting the Problem Two  Care Management [place] for Problem Two  Active   Interventions for Problem Two Long Term Goal   1. continue to check sugars 2x/day  2. continue to walk 5/7 for at least 30 minutes  3. Never skip supper   4. Aim for 3 meals per day  5. Eat supper every night   [facility] Long Term Goal (31-90) days  Maintain A1C less than 7.4%   [facility] Long Term Goal Start Date  01/14/16     I have asked Alyssa Schultz to eat 1 protein, 2-3 carbs and "free vegetables at each meal.   Alyssa Schultz will follow up with Dr. Gilford Rile in late June, 2017. I look forward to getting her A1C.   [name], RN, BA, MHA, CDE Diabetes Coordinator Inpatient Diabetes Program  [phone] (Team Pager) [phone] ([place]) 01/14/2016 2:27 PM

## 2016-01-15 ENCOUNTER — Ambulatory Visit: Payer: Self-pay

## 2016-01-25 ENCOUNTER — Ambulatory Visit (INDEPENDENT_AMBULATORY_CARE_PROVIDER_SITE_OTHER): Payer: 59

## 2016-01-25 ENCOUNTER — Encounter: Payer: Self-pay | Admitting: Gynecology

## 2016-01-25 ENCOUNTER — Ambulatory Visit
Admission: EM | Admit: 2016-01-25 | Discharge: 2016-01-25 | Disposition: A | Discharge status: Home / Self Care | Payer: 59 | Attending: Family Medicine | Admitting: Family Medicine

## 2016-01-25 DIAGNOSIS — S9031XA Contusion of right foot, initial encounter: Secondary | ICD-10-CM

## 2016-01-25 DIAGNOSIS — M7989 Other specified soft tissue disorders: Secondary | ICD-10-CM | POA: Diagnosis not present

## 2016-01-25 DIAGNOSIS — M79671 Pain in right foot: Secondary | ICD-10-CM | POA: Diagnosis not present

## 2016-01-25 DIAGNOSIS — S99921A Unspecified injury of right foot, initial encounter: Secondary | ICD-10-CM | POA: Diagnosis not present

## 2016-01-25 MED ORDER — MELOXICAM 15 MG PO TABS: 15.0000 mg | ORAL_TABLET | Freq: Every day | ORAL | Status: DC | PRN

## 2016-01-25 NOTE — ED Provider Notes (Signed)
[facility]  ____________________________________________  Time seen: Approximately 3:05 PM  I have reviewed the triage vital signs and the nursing notes.   HISTORY  Chief Complaint Toe Injury   HPI Alyssa Schultz is a 50 y.o. female presents for the complaint of right big toe pain post injury 2 weeks ago. Patient reports that she was running and stepped down from the side walk and states that as she did that she stepped down directly on her right big toe. Patient reports that she had pain in that area immediately after injury. Patient reports that she has intermittently applied ice and tried to rest some but has had continued pain. Reports has continued to remain active and continued to run even with pain. Denies swelling. States occasional tingling sensation but denies numbness. Denies pain radiation. Denies any other pain or injury. Reports history of breaking this ankle. Denies fall to the ground.   Reports remained upright and continued to walk home. Denies head injury or loss of consciousness. States pain is mild but wanted to make sure that her foot was not broken.  Denies other leg or extremity pain. Denies recent sickness. Denies fevers. Denies chest pain, shortness of breath, dysuria, abdominal pain, neck pain, back pain, numbness or loss of sensation. Reports continues to eat and drink well. Reports continues to remain active.  PCP; Rica Mast, MD    Past Medical History  Diagnosis Date  . Hypertension   . Hyperlipidemia   . Diabetes mellitus     dx 2014   just take pills    Patient Active Problem List   Diagnosis Date Noted  . 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 Left arm pain 07/23/2015  . Allergic rhinitis 10/14/2012  . Hypertension 11/09/2011  . Diabetes mellitus type 2, controlled ([place]) 05/21/2011  . BMI 30.0-30.9,adult 05/21/2011    Past Surgical History  Procedure Laterality Date  . Facial  reconstruction surgery    . Ablation    . Anterior cervical decomp/discectomy fusion N/A 09/19/2015    Procedure: ANTERIOR CERVICAL DECOMPRESSION/DISCECTOMY FUSION 2 LEVELS;  Surgeon: Phylliss Bob, MD;  Location: [facility];  Service: Orthopedics;  Laterality: N/A;  Anterior cervical decompression fusion, cervical 3-4, cervical 4-5 decompression with instrumentation and allograft    Current Outpatient Rx  Name  Route  Sig  Dispense  Refill  . B-D ULTRA-FINE 33 LANCETS MISC      Use as directed   100 each   5   . ciprofloxacin (CIPRO) 500 MG tablet   Oral   Take 1 tablet (500 mg total) by mouth every 12 (twelve) hours. Patient not taking: Reported on 01/03/2016   10 tablet   0   . fluconazole (DIFLUCAN) 150 MG tablet   Oral   Take 1 tablet (150 mg total) by mouth daily. Patient not taking: Reported on 01/03/2016   1 tablet   0   . fluconazole (DIFLUCAN) 150 MG tablet   Oral   Take 1 tablet (150 mg total) by mouth once. Patient not taking: Reported on 01/14/2016   1 tablet   0   . gabapentin (NEURONTIN) 100 MG capsule   Oral   Take 1 capsule (100 mg total) by mouth 2 (two) times daily. Patient not taking: Reported on 01/14/2016   180 capsule   1   . gabapentin (NEURONTIN) 300 MG capsule   Oral   Take 1 capsule (300 mg total) by mouth 3 (three) times daily. Patient  not taking: Reported on 01/14/2016   90 capsule   3   . glucose blood test strip      Use as instructed   100 each   12   . hydrochlorothiazide (HYDRODIURIL) 25 MG tablet      TAKE 1 TABLET (25 MG TOTAL) BY MOUTH DAILY.   90 tablet   3   . lisinopril (PRINIVIL,ZESTRIL) 10 MG tablet      TAKE 1 TABLET BY MOUTH ONCE DAILY   90 tablet   1   . lubiprostone (AMITIZA) 24 MCG capsule   Oral   Take 1 capsule (24 mcg total) by mouth daily with breakfast. Patient not taking: Reported on 01/03/2016   30 capsule   3   . meclizine (ANTIVERT) 50 MG tablet   Oral   Take 1 tablet (50 mg total) by mouth 3  (three) times daily as needed for dizziness.   30 tablet   1   . meloxicam (MOBIC) 15 MG tablet   Oral   Take 1 tablet (15 mg total) by mouth daily.   30 tablet   0   . metFORMIN (GLUCOPHAGE) 500 MG tablet      TAKE 1 TABLET BY MOUTH 2 TIMES DAILY WITH A MEAL.   60 tablet   11   . metroNIDAZOLE (FLAGYL) 500 MG tablet   Oral   Take 1 tablet (500 mg total) by mouth 2 (two) times daily. Patient not taking: Reported on 01/03/2016   14 tablet   0   . metroNIDAZOLE (FLAGYL) 500 MG tablet   Oral   Take 1 tablet (500 mg total) by mouth 2 (two) times daily. Patient not taking: Reported on 01/14/2016   14 tablet   0   . ONGLYZA 2.5 MG TABS tablet      TAKE 1 TABLET (2.5 MG TOTAL) BY MOUTH DAILY.   30 tablet   6   . TRUE METRIX BLOOD GLUCOSE TEST test strip      CHECK BLOOD SUGAR TWICE A DAY AND AS NEEDED   100 each   PRN     Allergies Influenza vaccine live; Morphine and related; Prednisone; and Hydrocodone  Family History  Problem Relation Age of Onset  . Diabetes Mother   . Breast cancer Neg Hx   . Diabetes Brother     Social History Social History  Substance Use Topics  . Smoking status: Never Smoker   . Smokeless tobacco: None  . Alcohol Use: Yes     Comment: Occasional wine    Review of Systems Constitutional: No fever/chills Eyes: No visual changes. ENT: No sore throat. Cardiovascular: Denies chest pain. Respiratory: Denies shortness of breath. Gastrointestinal: No abdominal pain.  No nausea, no vomiting.  No diarrhea.  No constipation. Genitourinary: Negative for dysuria. Musculoskeletal: Negative for back pain. As above.  Skin: Negative for rash. Neurological: Negative for headaches, focal weakness or numbness.  10-point ROS otherwise negative.  ____________________________________________   PHYSICAL EXAM:  VITAL SIGNS: ED Triage Vitals  Enc Vitals Group at 1509     BP 113/84      Pulse 86      Resp 18      Temp 97.4 F      Temp src  oral      SpO2 99% room air      Weight --      Height --      Head Cir --      Peak Flow --  Pain Score --      Pain Loc --      Pain Edu? --      Excl. in [place]? --     Constitutional: Alert and oriented. Well appearing and in no acute distress. Eyes: Conjunctivae are normal. PERRL. EOMI. Head: Atraumatic.  Mouth/Throat: Mucous membranes are moist.   Neck: No stridor.  No cervical spine tenderness to palpation. Cardiovascular: Normal rate, regular rhythm. Grossly normal heart sounds.  Good peripheral circulation. Respiratory: Normal respiratory effort.  No retractions. Lungs CTAB.No wheezes, rales or rhonchi.  Gastrointestinal: Soft and nontender. No distention.  Musculoskeletal: No lower or upper extremity tenderness nor edema.  Bilateral pedal pulses equal and easily palpated.  Except: right medial foot along proximal first phalanx and distal first and second metatarsal mild tenderness to palpation, no swelling, no ecchymosis, full range of motion, steady gait, sensation intact, distal capillary refill <2 seconds, no motor or tendon deficits, steady gait.  Neurologic:  Normal speech and language. No gross focal neurologic deficits are appreciated. No gait instability. Skin:  Skin is warm, dry and intact. No rash noted. Psychiatric: Mood and affect are normal. Speech and behavior are normal.  ____________________________________________   LABS (all labs ordered are listed, but only abnormal results are displayed)  Labs Reviewed - No data to display ____________________________________________ RADIOLOGY  EXAM: RIGHT FOOT COMPLETE - 3+ VIEW  COMPARISON: None.  FINDINGS: No fracture or dislocation. Small heel spur. Minimal arthritic change first tarsometatarsal and first metatarsophalangeal joint.  IMPRESSION: No acute findings   Electronically Signed By: Skipper Cliche M.D. On: 01/25/2016 16:12  I, Marylene Land, personally viewed and evaluated these  images (plain radiographs) as part of my medical decision making, as well as reviewing the written report by the radiologist. ____________________________________________   PROCEDURES  Procedure(s) performed:  Post operative shoe applied by RN. Denies need for crutches.  ____________________________________________   INITIAL IMPRESSION / ASSESSMENT AND PLAN / ED COURSE  Pertinent labs & imaging results that were available during my care of the patient were reviewed by me and considered in my medical decision making (see chart for details).  Well appearing patient. No acute distress. Presents for the complaints of right medial foot pain post mechanical injury 2 weeks ago while running. Denies any other trauma. Denies radiation. Denies head injury or loss of consciousness. No focal neurological deficits. Patient with point bony tenderness to medial right midfoot, first and second metatarsal and first phalanx, no swelling, no ecchymosis, full range of motion. Will evaluate by x-ray.   Right foot x-ray negative per radiologist. Will treat supportively and with oral Mobic, patient reports taking Mobic in past and tolerating well. Postoperative shoe, ice, rest. Follow up with orthopedic as needed for continued pain. Discussed indication, risks and benefits of medications with patient.  Discussed follow up with Primary care physician this week. Discussed follow up and return parameters including no resolution or any worsening concerns. Patient verbalized understanding and agreed to plan.   ____________________________________________   FINAL CLINICAL IMPRESSION(S) / ED DIAGNOSES  Final diagnoses:  Foot contusion, right, initial encounter     Discharge Medication List as of 01/25/2016  4:17 PM    Mobic  Note: This dictation was prepared with Dragon dictation along with smaller phrase technology. Any transcriptional errors that result from this process are unintentional.        Marylene Land, NP 02/07/16 [phone]

## 2016-01-25 NOTE — Discharge Instructions (Signed)
Rest. Drink plenty of fluids. Apply ice and elevate.    Follow up with your primary care physician or orthopedic this week as needed. Return to Urgent care for new or worsening concerns.    Foot Contusion A foot contusion is a deep bruise to the foot. Contusions are the result of an injury that caused bleeding under the skin. The contusion may turn blue, purple, or yellow. Minor injuries will give you a painless contusion, but more severe contusions may stay painful and swollen for a few weeks. CAUSES  A foot contusion comes from a direct blow to that area, such as a heavy object falling on the foot. SYMPTOMS   Swelling of the foot.  Discoloration of the foot.  Tenderness or soreness of the foot. DIAGNOSIS  You will have a physical exam and will be asked about your history. You may need an X-ray of your foot to look for a broken bone (fracture).  TREATMENT  An elastic wrap may be recommended to support your foot. Resting, elevating, and applying cold compresses to your foot are often the best treatments for a foot contusion. Over-the-counter medicines may also be recommended for pain control. HOME CARE INSTRUCTIONS   Put ice on the injured area.  Put ice in a plastic bag.  Place a towel between your skin and the bag.  Leave the ice on for 15-20 minutes, 03-04 times a day.  Only take over-the-counter or prescription medicines for pain, discomfort, or fever as directed by your caregiver.  If told, use an elastic wrap as directed. This can help reduce swelling. You may remove the wrap for sleeping, showering, and bathing. If your toes become numb, cold, or blue, take the wrap off and reapply it more loosely.  Elevate your foot with pillows to reduce swelling.  Try to avoid standing or walking while the foot is painful. Do not resume use until instructed by your caregiver. Then, begin use gradually. If pain develops, decrease use. Gradually increase activities that do not cause  discomfort until you have normal use of your foot.  See your caregiver as directed. It is very important to keep all follow-up appointments in order to avoid any lasting problems with your foot, including long-term (chronic) pain. SEEK IMMEDIATE MEDICAL CARE IF:   You have increased redness, swelling, or pain in your foot.  Your swelling or pain is not relieved with medicines.  You have loss of feeling in your foot or are unable to move your toes.  Your foot turns cold or blue.  You have pain when you move your toes.  Your foot becomes warm to the touch.  Your contusion does not improve in 2 days. MAKE SURE YOU:   Understand these instructions.  Will watch your condition.  Will get help right away if you are not doing well or get worse.   This information is not intended to replace advice given to you by your health care provider. Make sure you discuss any questions you have with your health care provider.   Document Released: 05/25/2006 Document Revised: 02/02/2012 Document Reviewed: 04/09/2015 Elsevier Interactive Patient Education [employer].

## 2016-01-25 NOTE — ED Notes (Signed)
Patient c/o x 6 days while running step down from sidewalk and injury right big toe.

## 2016-02-10 ENCOUNTER — Ambulatory Visit: Payer: Self-pay | Admitting: Internal Medicine

## 2016-02-17 ENCOUNTER — Encounter: Payer: Self-pay | Admitting: Physician Assistant

## 2016-02-17 ENCOUNTER — Ambulatory Visit: Payer: Self-pay | Admitting: Physician Assistant

## 2016-02-17 VITALS — BP 120/80 | HR 78 | Temp 98.2°F

## 2016-02-17 DIAGNOSIS — R319 Hematuria, unspecified: Secondary | ICD-10-CM

## 2016-02-17 DIAGNOSIS — R300 Dysuria: Secondary | ICD-10-CM

## 2016-02-17 DIAGNOSIS — N390 Urinary tract infection, site not specified: Secondary | ICD-10-CM

## 2016-02-17 LAB — POCT URINALYSIS DIPSTICK
Bilirubin, UA: NEGATIVE
Glucose, UA: NEGATIVE
Ketones, UA: NEGATIVE
Nitrite, UA: NEGATIVE
PROTEIN UA: NEGATIVE
UROBILINOGEN UA: 0.2
pH, UA: 5.5

## 2016-02-17 MED ORDER — CIPROFLOXACIN HCL 500 MG PO TABS: 500.0000 mg | ORAL_TABLET | Freq: Two times a day (BID) | ORAL | Status: DC

## 2016-02-17 NOTE — Progress Notes (Signed)
S:  C/o uti sx for 2 days, burning, urgency, frequency, some low back pain, felt like she had a fever this am;  denies vaginal discharge, abdominal pain or flank pain:  Remainder ros neg  O:  Vitals wnl, nad, no cva tenderness, back nontender, lungs c t a,cv rrr, abd soft nontender, bs normal, n/v intact  A: uti  P: cipro 500mg  bid x 3d, increase water intake, add cranberry juice, return if not improving in 2 -3 days, return earlier if worsening, discussed pyelonephritis sx

## 2016-03-04 ENCOUNTER — Other Ambulatory Visit (HOSPITAL_COMMUNITY)
Admission: RE | Admit: 2016-03-04 | Discharge: 2016-03-04 | Disposition: A | Discharge status: Home / Self Care | Payer: 59 | Source: Ambulatory Visit | Attending: Internal Medicine | Admitting: Internal Medicine

## 2016-03-04 ENCOUNTER — Ambulatory Visit (INDEPENDENT_AMBULATORY_CARE_PROVIDER_SITE_OTHER): Payer: 59 | Admitting: Internal Medicine

## 2016-03-04 ENCOUNTER — Encounter: Payer: Self-pay | Admitting: Internal Medicine

## 2016-03-04 VITALS — BP 116/82 | HR 81 | Ht 65.0 in | Wt 187.4 lb

## 2016-03-04 DIAGNOSIS — Z01419 Encounter for gynecological examination (general) (routine) without abnormal findings: Secondary | ICD-10-CM | POA: Insufficient documentation

## 2016-03-04 DIAGNOSIS — I10 Essential (primary) hypertension: Secondary | ICD-10-CM | POA: Diagnosis not present

## 2016-03-04 DIAGNOSIS — Z1211 Encounter for screening for malignant neoplasm of colon: Secondary | ICD-10-CM | POA: Diagnosis not present

## 2016-03-04 DIAGNOSIS — Z1151 Encounter for screening for human papillomavirus (HPV): Secondary | ICD-10-CM | POA: Insufficient documentation

## 2016-03-04 DIAGNOSIS — Z0000 Encounter for general adult medical examination without abnormal findings: Secondary | ICD-10-CM

## 2016-03-04 DIAGNOSIS — E119 Type 2 diabetes mellitus without complications: Secondary | ICD-10-CM | POA: Diagnosis not present

## 2016-03-04 LAB — COMPREHENSIVE METABOLIC PANEL
ALBUMIN: 4.5 g/dL (ref 3.5–5.2)
ALT: 15 U/L (ref 0–35)
AST: 14 U/L (ref 0–37)
Alkaline Phosphatase: 57 U/L (ref 39–117)
BUN: 12 mg/dL (ref 6–23)
CALCIUM: 9.9 mg/dL (ref 8.4–10.5)
CHLORIDE: 99 meq/L (ref 96–112)
CO2: 27 meq/L (ref 19–32)
Creatinine, Ser: 0.77 mg/dL (ref 0.40–1.20)
GFR: 101.37 mL/min (ref >60.00)
Glucose, Bld: 189 mg/dL — ABNORMAL HIGH (ref 70–99)
POTASSIUM: 3.8 meq/L (ref 3.5–5.1)
SODIUM: 137 meq/L (ref 135–145)
Total Bilirubin: 0.6 mg/dL (ref 0.2–1.2)
Total Protein: 8.1 g/dL (ref 6.0–8.3)

## 2016-03-04 LAB — CBC WITH DIFFERENTIAL/PLATELET
BASOS PCT: 0.4 % (ref 0.0–3.0)
Basophils Absolute: 0 10*3/uL (ref 0.0–0.1)
EOS ABS: 0.1 10*3/uL (ref 0.0–0.7)
EOS PCT: 1.4 % (ref 0.0–5.0)
HEMATOCRIT: 42.2 % (ref 36.0–46.0)
HEMOGLOBIN: 13.8 g/dL (ref 12.0–15.0)
Lymphocytes Relative: 28.5 % (ref 12.0–46.0)
Lymphs Abs: 2.8 10*3/uL (ref 0.7–4.0)
MCHC: 32.8 g/dL (ref 30.0–36.0)
MCV: 84.7 fl (ref 78.0–100.0)
MONO ABS: 0.5 10*3/uL (ref 0.1–1.0)
Monocytes Relative: 4.8 % (ref 3.0–12.0)
NEUTROS ABS: 6.4 10*3/uL (ref 1.4–7.7)
Neutrophils Relative %: 64.9 % (ref 43.0–77.0)
PLATELETS: 276 10*3/uL (ref 150.0–400.0)
RBC: 4.98 Mil/uL (ref 3.87–5.11)
RDW: 14.6 % (ref 11.5–15.5)
WBC: 9.8 10*3/uL (ref 4.0–10.5)

## 2016-03-04 LAB — LIPID PANEL
CHOLESTEROL: 247 mg/dL — AB (ref 0–200)
HDL: 54.3 mg/dL (ref >39.00)
LDL CALC: 161 mg/dL — AB (ref 0–99)
NonHDL: 192.33
TRIGLYCERIDES: 157 mg/dL — AB (ref 0.0–149.0)
Total CHOL/HDL Ratio: 5
VLDL: 31.4 mg/dL (ref 0.0–40.0)

## 2016-03-04 LAB — MICROALBUMIN / CREATININE URINE RATIO
CREATININE, U: 107.8 mg/dL
MICROALB/CREAT RATIO: 0.6 mg/g (ref 0.0–30.0)
Microalb, Ur: 0.7 mg/dL (ref 0.0–1.9)

## 2016-03-04 LAB — TSH: TSH: 1.69 u[IU]/mL (ref 0.35–4.50)

## 2016-03-04 LAB — HEMOGLOBIN A1C: HEMOGLOBIN A1C: 8.5 % — AB (ref 4.6–6.5)

## 2016-03-04 NOTE — Progress Notes (Signed)
Subjective:    Patient ID: Alyssa Schultz, female    DOB: 10-02-1965, 50 y.o.   MRN: [id number]  HPI  50YO female presents for physical exam.  DM - BG elevated. Notes some dietary indiscretion. Not exercising. Planning to get back on track.   Aside from this, feeling well.   Wt Readings from Last 3 Encounters:  03/04/16 187 lb 6.4 oz (85.004 kg)  01/25/16 188 lb (85.276 kg)  10/12/15 182 lb (82.555 kg)   BP Readings from Last 3 Encounters:  03/04/16 116/82  02/17/16 120/80  01/25/16 113/84    Past Medical History  Diagnosis Date  . Hypertension   . Hyperlipidemia   . Diabetes mellitus     dx 2014   just take pills   Family History  Problem Relation Age of Onset  . Diabetes Mother   . Breast cancer Neg Hx   . Diabetes Brother    Past Surgical History  Procedure Laterality Date  . Facial reconstruction surgery    . Ablation    . Anterior cervical decomp/discectomy fusion N/A 09/19/2015    Procedure: ANTERIOR CERVICAL DECOMPRESSION/DISCECTOMY FUSION 2 LEVELS;  Surgeon: Phylliss Bob, MD;  Location: [facility];  Service: Orthopedics;  Laterality: N/A;  Anterior cervical decompression fusion, cervical 3-4, cervical 4-5 decompression with instrumentation and allograft   Social History   Social History  . Marital Status: Single    Spouse Name: N/A  . Number of Children: N/A  . Years of Education: N/A   Social History Main Topics  . Smoking status: Never Smoker   . Smokeless tobacco: None  . Alcohol Use: Yes     Comment: Occasional wine  . Drug Use: No  . Sexual Activity: Not Asked   Other Topics Concern  . None   Social History Narrative    Review of Systems  Constitutional: Negative for fever, chills, appetite change, fatigue and unexpected weight change.  Eyes: Negative for visual disturbance.  Respiratory: Negative for shortness of breath.   Cardiovascular: Negative for chest pain and leg swelling.  Gastrointestinal: Negative for nausea, vomiting,  abdominal pain, diarrhea and constipation.  Genitourinary: Negative for dysuria, vaginal bleeding, vaginal discharge and vaginal pain.  Musculoskeletal: Negative for myalgias and arthralgias.  Skin: Negative for color change and rash.  Neurological: Negative for weakness.  Hematological: Negative for adenopathy. Does not bruise/bleed easily.  Psychiatric/Behavioral: Negative for suicidal ideas, sleep disturbance and dysphoric mood. The patient is not nervous/anxious.        Objective:    BP 116/82 mmHg  Pulse 81  Ht 5\' 5"  (1.651 m)  Wt 187 lb 6.4 oz (85.004 kg)  BMI 31.18 kg/m2  SpO2 96% Physical Exam  Constitutional: She is oriented to person, place, and time. She appears well-developed and well-nourished. No distress.  HENT:  Head: Normocephalic and atraumatic.  Right Ear: External ear normal.  Left Ear: External ear normal.  Nose: Nose normal.  Mouth/Throat: Oropharynx is clear and moist. No oropharyngeal exudate.  Eyes: Conjunctivae are normal. Pupils are equal, round, and reactive to light. Right eye exhibits no discharge. Left eye exhibits no discharge. No scleral icterus.  Neck: Normal range of motion. Neck supple. No tracheal deviation present. No thyromegaly present.  Cardiovascular: Normal rate, regular rhythm, normal heart sounds and intact distal pulses.  Exam reveals no gallop and no friction rub.   No murmur heard. Pulmonary/Chest: Effort normal and breath sounds normal. No respiratory distress. She has no wheezes. She has no rales. She  exhibits no tenderness.  Abdominal: Soft. Bowel sounds are normal. She exhibits no distension and no mass. There is no tenderness. There is no rebound and no guarding.  Genitourinary: Rectum normal, vagina normal and uterus normal. No breast swelling, tenderness, discharge or bleeding. Pelvic exam was performed with patient supine. There is no rash, tenderness or lesion on the right labia. There is no rash, tenderness or lesion on the  left labia. Uterus is not enlarged and not tender. Cervix exhibits no motion tenderness, no discharge and no friability. Right adnexum displays no mass, no tenderness and no fullness. Left adnexum displays no mass, no tenderness and no fullness. No erythema or tenderness in the vagina. No vaginal discharge found.  Musculoskeletal: Normal range of motion. She exhibits no edema or tenderness.  Lymphadenopathy:    She has no cervical adenopathy.  Neurological: She is alert and oriented to person, place, and time. No cranial nerve deficit. She exhibits normal muscle tone. Coordination normal.  Skin: Skin is warm and dry. No rash noted. She is not diaphoretic. No erythema. No pallor.  Psychiatric: She has a normal mood and affect. Her behavior is normal. Judgment and thought content normal.          Assessment & Plan:   Problem List Items Addressed This Visit      Unprioritized   Diabetes mellitus type 2, controlled ([place])    A1c with labs today.      Hypertension   Routine general medical examination at a health care facility - Primary    General medical exam normal today including breast and pelvic exam. PAP pending. Labs today. Mammogram UTD. Immunizations UTD. Encouraged healthy diet and exercise. Encouraged colonoscopy and GI referral placed.      Relevant Orders   Cytology - PAP   CBC with Differential/Platelet   Comprehensive metabolic panel   Lipid panel   Hemoglobin A1c   TSH   Microalbumin / creatinine urine ratio    Other Visit Diagnoses    Screening for colon cancer        Relevant Orders    Ambulatory referral to Gastroenterology        Return in about 3 months (around 06/04/2016) for New Patient.  Ronette Deter, MD [facility]

## 2016-03-04 NOTE — Progress Notes (Signed)
Pre visit review using our clinic review tool, if applicable. No additional management support is needed unless otherwise documented below in the visit note. 

## 2016-03-04 NOTE — Patient Instructions (Signed)
Health Maintenance, Female Adopting a healthy lifestyle and getting preventive care can go a long way to promote health and wellness. Talk with your health care provider about what schedule of regular examinations is right for you. This is a good chance for you to check in with your provider about disease prevention and staying healthy. In between checkups, there are plenty of things you can do on your own. Experts have done a lot of research about which lifestyle changes and preventive measures are most likely to keep you healthy. Ask your health care provider for more information. WEIGHT AND DIET  Eat a healthy diet  Be sure to include plenty of vegetables, fruits, low-fat dairy products, and lean protein.  Do not eat a lot of foods high in solid fats, added sugars, or salt.  Get regular exercise. This is one of the most important things you can do for your health.  Most adults should exercise for at least 150 minutes each week. The exercise should increase your heart rate and make you sweat (moderate-intensity exercise).  Most adults should also do strengthening exercises at least twice a week. This is in addition to the moderate-intensity exercise.  Maintain a healthy weight  Body mass index (BMI) is a measurement that can be used to identify possible weight problems. It estimates body fat based on height and weight. Your health care provider can help determine your BMI and help you achieve or maintain a healthy weight.  For females 20 years of age and older:   A BMI below 18.5 is considered underweight.  A BMI of 18.5 to 24.9 is normal.  A BMI of 25 to 29.9 is considered overweight.  A BMI of 30 and above is considered obese.  Watch levels of cholesterol and blood lipids  You should start having your blood tested for lipids and cholesterol at 52 years of age, then have this test every 5 years.  You may need to have your cholesterol levels checked more often if:  Your lipid  or cholesterol levels are high.  You are older than 52 years of age.  You are at high risk for heart disease.  CANCER SCREENING   Lung Cancer  Lung cancer screening is recommended for adults 55-80 years old who are at high risk for lung cancer because of a history of smoking.  A yearly low-dose CT scan of the lungs is recommended for people who:  Currently smoke.  Have quit within the past 15 years.  Have at least a 30-pack-year history of smoking. A pack year is smoking an average of one pack of cigarettes a day for 1 year.  Yearly screening should continue until it has been 15 years since you quit.  Yearly screening should stop if you develop a health problem that would prevent you from having lung cancer treatment.  Breast Cancer  Practice breast self-awareness. This means understanding how your breasts normally appear and feel.  It also means doing regular breast self-exams. Let your health care provider know about any changes, no matter how small.  If you are in your 20s or 30s, you should have a clinical breast exam (CBE) by a health care provider every 1-3 years as part of a regular health exam.  If you are 40 or older, have a CBE every year. Also consider having a breast X-ray (mammogram) every year.  If you have a family history of breast cancer, talk to your health care provider about genetic screening.  If you   are at high risk for breast cancer, talk to your health care provider about having an MRI and a mammogram every year.  Breast cancer gene (BRCA) assessment is recommended for women who have family members with BRCA-related cancers. BRCA-related cancers include:  Breast.  Ovarian.  Tubal.  Peritoneal cancers.  Results of the assessment will determine the need for genetic counseling and BRCA1 and BRCA2 testing. Cervical Cancer Your health care provider may recommend that you be screened regularly for cancer of the pelvic organs (ovaries, uterus, and  vagina). This screening involves a pelvic examination, including checking for microscopic changes to the surface of your cervix (Pap test). You may be encouraged to have this screening done every 3 years, beginning at age 21.  For women ages 30-65, health care providers may recommend pelvic exams and Pap testing every 3 years, or they may recommend the Pap and pelvic exam, combined with testing for human papilloma virus (HPV), every 5 years. Some types of HPV increase your risk of cervical cancer. Testing for HPV may also be done on women of any age with unclear Pap test results.  Other health care providers may not recommend any screening for nonpregnant women who are considered low risk for pelvic cancer and who do not have symptoms. Ask your health care provider if a screening pelvic exam is right for you.  If you have had past treatment for cervical cancer or a condition that could lead to cancer, you need Pap tests and screening for cancer for at least 20 years after your treatment. If Pap tests have been discontinued, your risk factors (such as having a new sexual partner) need to be reassessed to determine if screening should resume. Some women have medical problems that increase the chance of getting cervical cancer. In these cases, your health care provider may recommend more frequent screening and Pap tests. Colorectal Cancer  This type of cancer can be detected and often prevented.  Routine colorectal cancer screening usually begins at 52 years of age and continues through 52 years of age.  Your health care provider may recommend screening at an earlier age if you have risk factors for colon cancer.  Your health care provider may also recommend using home test kits to check for hidden blood in the stool.  A small camera at the end of a tube can be used to examine your colon directly (sigmoidoscopy or colonoscopy). This is done to check for the earliest forms of colorectal  cancer.  Routine screening usually begins at age 50.  Direct examination of the colon should be repeated every 5-10 years through 52 years of age. However, you may need to be screened more often if early forms of precancerous polyps or small growths are found. Skin Cancer  Check your skin from head to toe regularly.  Tell your health care provider about any new moles or changes in moles, especially if there is a change in a mole's shape or color.  Also tell your health care provider if you have a mole that is larger than the size of a pencil eraser.  Always use sunscreen. Apply sunscreen liberally and repeatedly throughout the day.  Protect yourself by wearing long sleeves, pants, a wide-brimmed hat, and sunglasses whenever you are outside. HEART DISEASE, DIABETES, AND HIGH BLOOD PRESSURE   High blood pressure causes heart disease and increases the risk of stroke. High blood pressure is more likely to develop in:  People who have blood pressure in the high end   of the normal range (130-139/85-89 mm Hg).  People who are overweight or obese.  People who are African American.  If you are 38-23 years of age, have your blood pressure checked every 3-5 years. If you are 61 years of age or older, have your blood pressure checked every year. You should have your blood pressure measured twice--once when you are at a hospital or clinic, and once when you are not at a hospital or clinic. Record the average of the two measurements. To check your blood pressure when you are not at a hospital or clinic, you can use:  An automated blood pressure machine at a pharmacy.  A home blood pressure monitor.  If you are between 45 years and 39 years old, ask your health care provider if you should take aspirin to prevent strokes.  Have regular diabetes screenings. This involves taking a blood sample to check your fasting blood sugar level.  If you are at a normal weight and have a low risk for diabetes,  have this test once every three years after 52 years of age.  If you are overweight and have a high risk for diabetes, consider being tested at a younger age or more often. PREVENTING INFECTION  Hepatitis B  If you have a higher risk for hepatitis B, you should be screened for this virus. You are considered at high risk for hepatitis B if:  You were born in a country where hepatitis B is common. Ask your health care provider which countries are considered high risk.  Your parents were born in a high-risk country, and you have not been immunized against hepatitis B (hepatitis B vaccine).  You have HIV or AIDS.  You use needles to inject street drugs.  You live with someone who has hepatitis B.  You have had sex with someone who has hepatitis B.  You get hemodialysis treatment.  You take certain medicines for conditions, including cancer, organ transplantation, and autoimmune conditions. Hepatitis C  Blood testing is recommended for:  Everyone born from [birth year] through 1967.  Anyone with known risk factors for hepatitis C. Sexually transmitted infections (STIs)  You should be screened for sexually transmitted infections (STIs) including gonorrhea and chlamydia if:  You are sexually active and are younger than 52 years of age.  You are older than 53 years of age and your health care provider tells you that you are at risk for this type of infection.  Your sexual activity has changed since you were last screened and you are at an increased risk for chlamydia or gonorrhea. Ask your health care provider if you are at risk.  If you do not have HIV, but are at risk, it may be recommended that you take a prescription medicine daily to prevent HIV infection. This is called pre-exposure prophylaxis (PrEP). You are considered at risk if:  You are sexually active and do not regularly use condoms or know the HIV status of your partner(s).  You take drugs by injection.  You are sexually  active with a partner who has HIV. Talk with your health care provider about whether you are at high risk of being infected with HIV. If you choose to begin PrEP, you should first be tested for HIV. You should then be tested every 3 months for as long as you are taking PrEP.  PREGNANCY   If you are premenopausal and you may become pregnant, ask your health care provider about preconception counseling.  If you may  become pregnant, take 400 to 800 micrograms (mcg) of folic acid every day.  If you want to prevent pregnancy, talk to your health care provider about birth control (contraception). OSTEOPOROSIS AND MENOPAUSE   Osteoporosis is a disease in which the bones lose minerals and strength with aging. This can result in serious bone fractures. Your risk for osteoporosis can be identified using a bone density scan.  If you are 61 years of age or older, or if you are at risk for osteoporosis and fractures, ask your health care provider if you should be screened.  Ask your health care provider whether you should take a calcium or vitamin D supplement to lower your risk for osteoporosis.  Menopause may have certain physical symptoms and risks.  Hormone replacement therapy may reduce some of these symptoms and risks. Talk to your health care provider about whether hormone replacement therapy is right for you.  HOME CARE INSTRUCTIONS   Schedule regular health, dental, and eye exams.  Stay current with your immunizations.   Do not use any tobacco products including cigarettes, chewing tobacco, or electronic cigarettes.  If you are pregnant, do not drink alcohol.  If you are breastfeeding, limit how much and how often you drink alcohol.  Limit alcohol intake to no more than 1 drink per day for nonpregnant women. One drink equals 12 ounces of beer, 5 ounces of wine, or 1 ounces of hard liquor.  Do not use street drugs.  Do not share needles.  Ask your health care provider for help if  you need support or information about quitting drugs.  Tell your health care provider if you often feel depressed.  Tell your health care provider if you have ever been abused or do not feel safe at home.   This information is not intended to replace advice given to you by your health care provider. Make sure you discuss any questions you have with your health care provider.   Document Released: 02/16/2011 Document Revised: 08/24/2014 Document Reviewed: 07/05/2013 Elsevier Interactive Patient Education [employer].

## 2016-03-04 NOTE — Assessment & Plan Note (Addendum)
General medical exam normal today including breast and pelvic exam. PAP pending. Labs today. Mammogram UTD. Immunizations UTD. Encouraged healthy diet and exercise. Encouraged colonoscopy and GI referral placed.

## 2016-03-04 NOTE — Assessment & Plan Note (Signed)
A1c with labs today. 

## 2016-03-05 ENCOUNTER — Encounter: Payer: Self-pay | Admitting: Internal Medicine

## 2016-03-05 LAB — CYTOLOGY - PAP

## 2016-03-06 ENCOUNTER — Other Ambulatory Visit: Payer: Self-pay | Admitting: Internal Medicine

## 2016-03-08 ENCOUNTER — Encounter: Payer: Self-pay | Admitting: Internal Medicine

## 2016-03-17 ENCOUNTER — Telehealth: Payer: Self-pay

## 2016-03-17 NOTE — Telephone Encounter (Signed)
Patient needs to be scheduled for a screening colonoscopy

## 2016-04-08 ENCOUNTER — Other Ambulatory Visit: Payer: Self-pay

## 2016-04-08 NOTE — Patient Outreach (Signed)
[facility]) Care Management  04/08/2016  Alyssa Schultz 11-Sep-1965 [id number]   Follow up e-mail to Lexa today to get an update on her health.    On 03/05/16 she reported that her A1C had jumped up to 8.5%- she was under tremendous stress selling her house and her car.  She was eating a cup of ice cream everyday.  She was determined to get back on track.  03/10/16 reported that her weight on 03/04/16 was 187.5lbs but down to 183.lbs on 03/10/16 . She reports she had started to go to the [employer] everyday. CBG 113 mg/dl after work, and 2 hour post prandial 143mg /dl but it rose to 154mg /dl at 3am.  She has moved her weekly hair appointment to Saturday so she can exercise every day.   03/11/16 4am CBG 135mg /dl and 139mg /dl at [place], RN, BA, [place], [place] Direct Dial:  [phone]  Fax:  [phone] E-mail: [email or web address] [address], [place], [place]  [address]

## 2016-05-20 ENCOUNTER — Telehealth: Payer: Self-pay | Admitting: *Deleted

## 2016-05-20 NOTE — Telephone Encounter (Signed)
Patient has requested a medication refill for lisinopril [facility]

## 2016-05-21 MED ORDER — LISINOPRIL 10 MG PO TABS: 10.0000 mg | ORAL_TABLET | Freq: Every day | ORAL | 1 refills | Status: DC

## 2016-05-21 NOTE — Telephone Encounter (Signed)
Medication has been refilled.

## 2016-06-04 ENCOUNTER — Encounter: Payer: Self-pay | Admitting: Family

## 2016-06-04 ENCOUNTER — Ambulatory Visit (INDEPENDENT_AMBULATORY_CARE_PROVIDER_SITE_OTHER): Payer: 59 | Admitting: Family

## 2016-06-04 VITALS — BP 114/78 | HR 70 | Temp 97.6°F | Wt 183.0 lb

## 2016-06-04 DIAGNOSIS — R87618 Other abnormal cytological findings on specimens from cervix uteri: Secondary | ICD-10-CM

## 2016-06-04 DIAGNOSIS — E119 Type 2 diabetes mellitus without complications: Secondary | ICD-10-CM | POA: Diagnosis not present

## 2016-06-04 DIAGNOSIS — R8789 Other abnormal findings in specimens from female genital organs: Secondary | ICD-10-CM

## 2016-06-04 DIAGNOSIS — I10 Essential (primary) hypertension: Secondary | ICD-10-CM

## 2016-06-04 DIAGNOSIS — M25552 Pain in left hip: Secondary | ICD-10-CM | POA: Diagnosis not present

## 2016-06-04 LAB — HEMOGLOBIN A1C: Hgb A1c MFr Bld: 6.5 % (ref 4.6–6.5)

## 2016-06-04 LAB — BASIC METABOLIC PANEL
BUN: 14 mg/dL (ref 6–23)
CHLORIDE: 100 meq/L (ref 96–112)
CO2: 30 mEq/L (ref 19–32)
Calcium: 9.7 mg/dL (ref 8.4–10.5)
Creatinine, Ser: 0.77 mg/dL (ref 0.40–1.20)
GFR: 101.27 mL/min (ref >60.00)
Glucose, Bld: 130 mg/dL — ABNORMAL HIGH (ref 70–99)
POTASSIUM: 3.7 meq/L (ref 3.5–5.1)
Sodium: 138 mEq/L (ref 135–145)

## 2016-06-04 LAB — POCT URINALYSIS DIPSTICK
Bilirubin, UA: NEGATIVE
Blood, UA: NEGATIVE
Glucose, UA: NEGATIVE
KETONES UA: NEGATIVE
LEUKOCYTES UA: NEGATIVE
NITRITE UA: NEGATIVE
PROTEIN UA: NEGATIVE
Spec Grav, UA: 1.01
UROBILINOGEN UA: 0.2
pH, UA: 6.5

## 2016-06-04 NOTE — Assessment & Plan Note (Signed)
Stable. Chronic. On mobic and nexium. Discussed with patient my concern due to reported ulcer. Advised to use mobic sparingly. Will continue to follow. No anemia 02/2016

## 2016-06-04 NOTE — Progress Notes (Signed)
Pre visit review using our clinic review tool, if applicable. No additional management support is needed unless otherwise documented below in the visit note. 

## 2016-06-04 NOTE — Patient Instructions (Signed)
BP goal < 140/90.   Managing Your High Blood Pressure Blood pressure is a measurement of how forceful your blood is pressing against the walls of the arteries. Arteries are muscular tubes within the circulatory system. Blood pressure does not stay the same. Blood pressure rises when you are active, excited, or nervous; and it lowers during sleep and relaxation. If the numbers measuring your blood pressure stay above normal most of the time, you are at risk for health problems. High blood pressure (hypertension) is a long-term (chronic) condition in which blood pressure is elevated. A blood pressure reading is recorded as two numbers, such as 120 over 80 (or 120/80). The first, higher number is called the systolic pressure. It is a measure of the pressure in your arteries as the heart beats. The second, lower number is called the diastolic pressure. It is a measure of the pressure in your arteries as the heart relaxes between beats.  Keeping your blood pressure in a normal range is important to your overall health and prevention of health problems, such as heart disease and stroke. When your blood pressure is uncontrolled, your heart has to work harder than normal. High blood pressure is a very common condition in adults because blood pressure tends to rise with age. Men and women are equally likely to have hypertension but at different times in life. Before age 9, men are more likely to have hypertension. After 52 years of age, women are more likely to have it. Hypertension is especially common in African Americans. This condition often has no signs or symptoms. The cause of the condition is usually not known. Your caregiver can help you come up with a plan to keep your blood pressure in a normal, healthy range. BLOOD PRESSURE STAGES Blood pressure is classified into four stages: normal, prehypertension, stage 1, and stage 2. Your blood pressure reading will be used to determine what type of treatment, if  any, is necessary. Appropriate treatment options are tied to these four stages:  Normal  Systolic pressure (mm Hg): below 120.  Diastolic pressure (mm Hg): below 80. Prehypertension  Systolic pressure (mm Hg): 120 to 139.  Diastolic pressure (mm Hg): 80 to 89. Stage1  Systolic pressure (mm Hg): 140 to 159.  Diastolic pressure (mm Hg): 90 to 99. Stage2  Systolic pressure (mm Hg): 160 or above.  Diastolic pressure (mm Hg): 100 or above. RISKS RELATED TO HIGH BLOOD PRESSURE Managing your blood pressure is an important responsibility. Uncontrolled high blood pressure can lead to:  A heart attack.  A stroke.  A weakened blood vessel (aneurysm).  Heart failure.  Kidney damage.  Eye damage.  Metabolic syndrome.  Memory and concentration problems. HOW TO MANAGE YOUR BLOOD PRESSURE Blood pressure can be managed effectively with lifestyle changes and medicines (if needed). Your caregiver will help you come up with a plan to bring your blood pressure within a normal range. Your plan should include the following: Education  Read all information provided by your caregivers about how to control blood pressure.  Educate yourself on the latest guidelines and treatment recommendations. New research is always being done to further define the risks and treatments for high blood pressure. Lifestylechanges  Control your weight.  Avoid smoking.  Stay physically active.  Reduce the amount of salt in your diet.  Reduce stress.  Control any chronic conditions, such as high cholesterol or diabetes.  Reduce your alcohol intake. Medicines  Several medicines (antihypertensive medicines) are available, if needed, to bring  blood pressure within a normal range. Communication  Review all the medicines you take with your caregiver because there may be side effects or interactions.  Talk with your caregiver about your diet, exercise habits, and other lifestyle factors that may be  contributing to high blood pressure.  See your caregiver regularly. Your caregiver can help you create and adjust your plan for managing high blood pressure. RECOMMENDATIONS FOR TREATMENT AND FOLLOW-UP  The following recommendations are based on current guidelines for managing high blood pressure in nonpregnant adults. Use these recommendations to identify the proper follow-up period or treatment option based on your blood pressure reading. You can discuss these options with your caregiver.  Systolic pressure of 123456 to XX123456 or diastolic pressure of 80 to 89: Follow up with your caregiver as directed.  Systolic pressure of XX123456 to 0000000 or diastolic pressure of 90 to 100: Follow up with your caregiver within 2 months.  Systolic pressure above 0000000 or diastolic pressure above 123XX123: Follow up with your caregiver within 1 month.  Systolic pressure above 99991111 or diastolic pressure above A999333: Consider antihypertensive therapy; follow up with your caregiver within 1 week.  Systolic pressure above A999333 or diastolic pressure above 123456: Begin antihypertensive therapy; follow up with your caregiver within 1 week.   This information is not intended to replace advice given to you by your health care provider. Make sure you discuss any questions you have with your health care provider.   Document Released: 04/27/2012 Document Reviewed: 04/27/2012 Elsevier Interactive Patient Education [employer].

## 2016-06-04 NOTE — Progress Notes (Signed)
Subjective:    Patient ID: Alyssa Schultz, female    DOB: 07/14/1966, 50 y.o.   MRN: [id number]  CC: Alyssa Schultz is a 50 y.o. female who presents today for follow up.   HPI: Patient here for follow-up and to establish care. She had her physical 7/17.   HTN- Complaint with medication. Denies exertional chest pain or pressure, numbness or tingling radiating to left arm or jaw, palpitations, dizziness, frequent headaches, changes in vision, or shortness of breath.   DM- Fasting BS 130. compliant with medications; exercising every daily. Following with [facility]. Takes gabapentin occasionally for right toe numbness pain  Left hip pain- chronic, take mobic everyday. On nexium. H/o ulcer.    HISTORY:  Past Medical History:  Diagnosis Date  . Diabetes mellitus    dx 2014   just take pills  . Hyperlipidemia   . Hypertension    Past Surgical History:  Procedure Laterality Date  . ablation    . ANTERIOR CERVICAL DECOMP/DISCECTOMY FUSION N/A 09/19/2015   Procedure: ANTERIOR CERVICAL DECOMPRESSION/DISCECTOMY FUSION 2 LEVELS;  Surgeon: Phylliss Bob, MD;  Location: [facility];  Service: Orthopedics;  Laterality: N/A;  Anterior cervical decompression fusion, cervical 3-4, cervical 4-5 decompression with instrumentation and allograft  . FACIAL RECONSTRUCTION SURGERY     Family History  Problem Relation Age of Onset  . Diabetes Mother   . Diabetes Brother   . Breast cancer Neg Hx     Allergies: Influenza vaccine live; Morphine and related; Prednisone; and Hydrocodone Current Outpatient Prescriptions on File Prior to Visit  Medication Sig Dispense Refill  . gabapentin (NEURONTIN) 100 MG capsule Take 1 capsule (100 mg total) by mouth 2 (two) times daily. 180 capsule 1  . gabapentin (NEURONTIN) 300 MG capsule Take 1 capsule (300 mg total) by mouth 3 (three) times daily. 90 capsule 3  . glucose blood test strip Use as instructed 100 each 12  . hydrochlorothiazide (HYDRODIURIL) 25 MG tablet  TAKE 1 TABLET (25 MG TOTAL) BY MOUTH DAILY. 90 tablet 3  . lisinopril (PRINIVIL,ZESTRIL) 10 MG tablet Take 1 tablet (10 mg total) by mouth daily. 90 tablet 1  . lubiprostone (AMITIZA) 24 MCG capsule Take 1 capsule (24 mcg total) by mouth daily with breakfast. 30 capsule 3  . meclizine (ANTIVERT) 50 MG tablet Take 1 tablet (50 mg total) by mouth 3 (three) times daily as needed for dizziness. 30 tablet 1  . meloxicam (MOBIC) 15 MG tablet Take 1 tablet (15 mg total) by mouth daily as needed for pain. 10 tablet 0  . metFORMIN (GLUCOPHAGE) 500 MG tablet TAKE 1 TABLET BY MOUTH 2 TIMES DAILY WITH A MEAL. 60 tablet 11  . ONGLYZA 2.5 MG TABS tablet TAKE 1 TABLET (2.5 MG TOTAL) BY MOUTH DAILY. 30 tablet 6  . TRUE METRIX BLOOD GLUCOSE TEST test strip CHECK BLOOD SUGAR TWICE A DAY AND AS NEEDED 100 each PRN  . TRUEPLUS LANCETS 30G MISC USE AS DIRECTED 100 each 5   No current facility-administered medications on file prior to visit.     Social History  Substance Use Topics  . Smoking status: Never Smoker  . Smokeless tobacco: Never Used  . Alcohol use Yes     Comment: Occasional wine    Review of Systems  Constitutional: Negative for chills and fever.  Eyes: Negative for visual disturbance.  Respiratory: Negative for cough.   Cardiovascular: Negative for chest pain and palpitations.  Gastrointestinal: Negative for nausea and vomiting.  Neurological:  Negative for headaches.      Objective:    BP 114/78   Pulse 70   Temp 97.6 F (36.4 C) (Oral)   Wt 183 lb (83 kg)   SpO2 97%   BMI 30.45 kg/m  BP Readings from Last 3 Encounters:  06/04/16 114/78  03/04/16 116/82  02/17/16 120/80   Wt Readings from Last 3 Encounters:  06/04/16 183 lb (83 kg)  03/04/16 187 lb 6.4 oz (85 kg)  01/25/16 188 lb (85.3 kg)    Physical Exam  Constitutional: She appears well-developed and well-nourished.  Eyes: Conjunctivae are normal.  Cardiovascular: Normal rate, regular rhythm, normal heart sounds and  normal pulses.   Pulmonary/Chest: Effort normal and breath sounds normal. She has no wheezes. She has no rhonchi. She has no rales.  Neurological: She is alert.  Skin: Skin is warm and dry.  Psychiatric: She has a normal mood and affect. Her speech is normal and behavior is normal. Thought content normal.  Vitals reviewed.      Assessment & Plan:   Problem List Items Addressed This Visit      Cardiovascular and Mediastinum   Hypertension    At goal. Wants to come off medication. Advised she may trial coming off of HCTZ and monitor BP. Pending BMP.       Relevant Orders   Basic metabolic panel     Endocrine   Diabetes mellitus type 2, controlled ([place]) - Primary    At goal based on reported blood sugars. Pending A1C. Will consider decreasing metformin as patient is determined to control with lifestyle.       Relevant Orders   Hemoglobin A1c   POCT urinalysis dipstick     Other   Left hip pain    Stable. Chronic. On mobic and nexium. Discussed with patient my concern due to reported ulcer. Advised to use mobic sparingly. Will continue to follow. No anemia 02/2016       Other Visit Diagnoses    Pap smear abnormality of cervix/human papillomavirus (HPV) positive       Relevant Orders   Ambulatory referral to Gynecology       I am having Ms. Stanwood maintain her lubiprostone, meclizine, gabapentin, gabapentin, hydrochlorothiazide, TRUE METRIX BLOOD GLUCOSE TEST, glucose blood, metFORMIN, ONGLYZA, meloxicam, TRUEPLUS LANCETS 30G, and lisinopril.   No orders of the defined types were placed in this encounter.   Return precautions given.   Risks, benefits, and alternatives of the medications and treatment plan prescribed today were discussed, and patient expressed understanding.   Education regarding symptom management and diagnosis given to patient on AVS.  Continue to follow with Mable Paris, FNP for routine health maintenance.   Cephus Richer and I agreed with  plan.   Mable Paris, FNP

## 2016-06-04 NOTE — Assessment & Plan Note (Signed)
At goal based on reported blood sugars. Pending A1C. Will consider decreasing metformin as patient is determined to control with lifestyle.

## 2016-06-04 NOTE — Assessment & Plan Note (Signed)
At goal. Wants to come off medication. Advised she may trial coming off of HCTZ and monitor BP. Pending BMP.

## 2016-06-12 ENCOUNTER — Ambulatory Visit
Admission: EM | Admit: 2016-06-12 | Discharge: 2016-06-12 | Disposition: A | Discharge status: Home / Self Care | Payer: 59 | Attending: Family Medicine | Admitting: Family Medicine

## 2016-06-12 ENCOUNTER — Encounter: Payer: Self-pay | Admitting: Family

## 2016-06-12 ENCOUNTER — Ambulatory Visit (INDEPENDENT_AMBULATORY_CARE_PROVIDER_SITE_OTHER): Payer: 59

## 2016-06-12 DIAGNOSIS — S83412A Sprain of medial collateral ligament of left knee, initial encounter: Secondary | ICD-10-CM

## 2016-06-12 DIAGNOSIS — M25562 Pain in left knee: Secondary | ICD-10-CM | POA: Diagnosis not present

## 2016-06-12 DIAGNOSIS — S8992XA Unspecified injury of left lower leg, initial encounter: Secondary | ICD-10-CM | POA: Diagnosis not present

## 2016-06-12 MED ORDER — MELOXICAM 15 MG PO TABS: 15.0000 mg | ORAL_TABLET | Freq: Every day | ORAL | 0 refills | Status: DC

## 2016-06-12 NOTE — ED Triage Notes (Signed)
Pt was exercising on Monday and she twisted her left knee.

## 2016-06-12 NOTE — ED Provider Notes (Signed)
[facility]    CSN: [id number] Arrival date & time: 06/12/16  1627     History   Chief Complaint Chief Complaint  Patient presents with  . Knee Pain    Left    HPI Alyssa Schultz is a 50 y.o. female.   Patient reports while displaying and a bump exercise on Monday she felt some pain in her left knee over the superior medial aspect.. She is able to walk Tuesday or Wednesday she is was unable to walk nearly as much except decreased amount walking on Thursday. She denies any direct injury such as contusion or falls. Past smoker history she does have hypertension, hyperlipidemia and diabetes. She has lost about 14 pounds by exercising and watching her diet. She had uterine ablation and cervical spine surgery. No significant past medical history pertinent to today's visit. She never smoked. No pertinent past family medical history relevant to today's visit. She is allergic to hydrochlorothiazide, morphine,prednisone, and flu vaccination.   The history is provided by the patient. No language interpreter was used.  Knee Pain  Location:  Knee Knee location:  L knee Pain details:    Quality:  Aching, shooting and tingling   Severity:  Moderate   Onset quality:  Sudden   Timing:  Constant   Progression:  Worsening Chronicity:  New Dislocation: no   Foreign body present:  No foreign bodies   Past Medical History:  Diagnosis Date  . Diabetes mellitus    dx 2014   just take pills  . Hyperlipidemia   . Hypertension     Patient Active Problem List   Diagnosis Date Noted  . Left hip pain 06/04/2016  . Routine general medical examination at a health care facility 03/04/2016  . 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 Allergic rhinitis 10/14/2012  . Hypertension 11/09/2011  . Diabetes mellitus type 2, controlled ([place]) 05/21/2011  . BMI 30.0-30.9,adult 05/21/2011    Past Surgical History:  Procedure Laterality Date  . abalsion      . ablation    . ANTERIOR CERVICAL DECOMP/DISCECTOMY FUSION N/A 09/19/2015   Procedure: ANTERIOR CERVICAL DECOMPRESSION/DISCECTOMY FUSION 2 LEVELS;  Surgeon: Phylliss Bob, MD;  Location: [facility];  Service: Orthopedics;  Laterality: N/A;  Anterior cervical decompression fusion, cervical 3-4, cervical 4-5 decompression with instrumentation and allograft  . FACIAL RECONSTRUCTION SURGERY      OB History    No data available       Home Medications    Prior to Admission medications   Medication Sig Start Date End Date Taking? Authorizing Provider  glucose blood test strip Use as instructed 09/16/15  Yes Versie Starks, PA-C  hydrochlorothiazide (HYDRODIURIL) 25 MG tablet TAKE 1 TABLET (25 MG TOTAL) BY MOUTH DAILY. 09/02/15  Yes Jackolyn Confer, MD  lisinopril (PRINIVIL,ZESTRIL) 10 MG tablet Take 1 tablet (10 mg total) by mouth daily. 05/21/16  Yes Burnard Hawthorne, FNP  meloxicam (MOBIC) 15 MG tablet Take 1 tablet (15 mg total) by mouth daily as needed for pain. 01/25/16  Yes Marylene Land, NP  metFORMIN (GLUCOPHAGE) 500 MG tablet TAKE 1 TABLET BY MOUTH 2 TIMES DAILY WITH A MEAL. 10/07/15  Yes Jackolyn Confer, MD  ONGLYZA 2.5 MG TABS tablet TAKE 1 TABLET (2.5 MG TOTAL) BY MOUTH DAILY. 11/22/15  Yes Jackolyn Confer, MD  TRUE METRIX BLOOD GLUCOSE TEST test strip CHECK BLOOD SUGAR TWICE A DAY AND AS NEEDED 11/07/15  Yes Versie Starks,  PA-C  TRUEPLUS LANCETS 30G MISC USE AS DIRECTED 03/06/16  Yes Jackolyn Confer, MD  gabapentin (NEURONTIN) 100 MG capsule Take 1 capsule (100 mg total) by mouth 2 (two) times daily. 08/20/15   Jackolyn Confer, MD  gabapentin (NEURONTIN) 300 MG capsule Take 1 capsule (300 mg total) by mouth 3 (three) times daily. 07/23/15   Jackolyn Confer, MD  lubiprostone (AMITIZA) 24 MCG capsule Take 1 capsule (24 mcg total) by mouth daily with breakfast. 10/31/14   Jackolyn Confer, MD  meclizine (ANTIVERT) 50 MG tablet Take 1 tablet (50 mg total) by mouth 3 (three) times daily as  needed for dizziness. 01/06/15   Lorin Picket, PA-C  meloxicam (MOBIC) 15 MG tablet Take 1 tablet (15 mg total) by mouth daily. 06/12/16   Frederich Cha, MD    Family History Family History  Problem Relation Age of Onset  . Diabetes Mother   . Diabetes Brother   . Breast cancer Neg Hx     Social History Social History  Substance Use Topics  . Smoking status: Never Smoker  . Smokeless tobacco: Never Used  . Alcohol use Yes     Comment: Occasional wine     Allergies   Influenza vaccine live; Morphine and related; Prednisone; and Hydrocodone   Review of Systems Review of Systems  All other systems reviewed and are negative.    Physical Exam Triage Vital Signs ED Triage Vitals  Enc Vitals Group     BP 06/12/16 1642 120/83     Pulse Rate 06/12/16 1642 70     Resp 06/12/16 1642 18     Temp 06/12/16 1642 98.2 F (36.8 C)     Temp Source 06/12/16 1642 Oral     SpO2 06/12/16 1642 99 %     Weight 06/12/16 1640 183 lb (83 kg)     Height 06/12/16 1640 5\' 5"  (1.651 m)     Head Circumference --      Peak Flow --      Pain Score 06/12/16 1642 7     Pain Loc --      Pain Edu? --      Excl. in [place]? --    No data found.   Updated Vital Signs BP 120/83 (BP Location: Left Arm)   Pulse 70   Temp 98.2 F (36.8 C) (Oral)   Resp 18   Ht 5\' 5"  (1.651 m)   Wt 183 lb (83 kg)   SpO2 99%   BMI 30.45 kg/m   Visual Acuity Right Eye Distance:   Left Eye Distance:   Bilateral Distance:    Right Eye Near:   Left Eye Near:    Bilateral Near:     Physical Exam  Constitutional: She appears well-developed and well-nourished.  HENT:  Head: Normocephalic and atraumatic.  Right Ear: External ear normal.  Left Ear: External ear normal.  Neck: Normal range of motion.  Pulmonary/Chest: Effort normal.  Musculoskeletal: Normal range of motion. She exhibits tenderness. She exhibits no deformity.       Left knee: She exhibits swelling. She exhibits normal range of motion, no  effusion, no deformity, normal alignment, no LCL laxity, no bony tenderness and no MCL laxity. Tenderness found.  Neurological: She is alert.  Skin: Skin is warm.  Psychiatric: She has a normal mood and affect.  Vitals reviewed.    UC Treatments / Results  Labs (all labs ordered are listed, but only abnormal results are displayed) Labs  Reviewed - No data to display  EKG  EKG Interpretation None       Radiology Dg Knee Complete 4 Views Left  Result Date: 06/12/2016 CLINICAL DATA:  Knee injury.  Pain EXAM: LEFT KNEE - COMPLETE 4+ VIEW COMPARISON:  None. FINDINGS: No evidence of fracture, dislocation, or joint effusion. No evidence of arthropathy or other focal bone abnormality. Soft tissues are unremarkable. IMPRESSION: Negative. Electronically Signed   By: Franchot Gallo M.D.   On: 06/12/2016 17:28    Procedures Procedures (including critical care time)  Medications Ordered in UC Medications - No data to display   Initial Impression / Assessment and Plan / UC Course  I have reviewed the triage vital signs and the nursing notes.  Pertinent labs & imaging results that were available during my care of the patient were reviewed by me and considered in my medical decision making (see chart for details).  Clinical Course   Explained to her that is not much we can do with a knee sprain/strain. Rest rehabilitation. The sympathetic neoprene sleeve that tends to sweat recommend sleeve that has a open port for the patella. She can start increasing exercise as tolerated but she really should rest rest this weekend. May take 2-6 weeks for knee sprain/strain to improve. Mobic 15 mg 1 tablet day as needed for pain. Work note given for today as well.   Final Clinical Impressions(s) / UC Diagnoses   Final diagnoses:  Sprain of medial collateral ligament of left knee, initial encounter    New Prescriptions Discharge Medication List as of 06/12/2016  5:41 PM    START taking these  medications   Details  !! meloxicam (MOBIC) 15 MG tablet Take 1 tablet (15 mg total) by mouth daily., Starting Fri 06/12/2016, Normal     !! - Potential duplicate medications found. Please discuss with provider.       Frederich Cha, MD 06/12/16 [phone]

## 2016-06-23 ENCOUNTER — Other Ambulatory Visit: Payer: Self-pay | Admitting: Family

## 2016-06-23 DIAGNOSIS — Z1231 Encounter for screening mammogram for malignant neoplasm of breast: Secondary | ICD-10-CM

## 2016-06-26 ENCOUNTER — Other Ambulatory Visit: Payer: Self-pay | Admitting: *Deleted

## 2016-06-26 MED ORDER — SAXAGLIPTIN HCL 2.5 MG PO TABS: ORAL_TABLET | ORAL | 5 refills | Status: DC

## 2016-06-29 ENCOUNTER — Telehealth: Payer: Self-pay | Admitting: Family

## 2016-06-29 MED ORDER — SAXAGLIPTIN HCL 2.5 MG PO TABS: ORAL_TABLET | ORAL | 5 refills | Status: DC

## 2016-06-29 NOTE — Telephone Encounter (Signed)
Pt called requesting a refill on saxagliptin HCl (ONGLYZA) 2.5 MG TABS tablet. Thank you!  Pharmacy - [facility], [address]  Call pt @ [phone]

## 2016-06-29 NOTE — Telephone Encounter (Signed)
Medication has bee refilled  

## 2016-07-08 ENCOUNTER — Ambulatory Visit
Admission: RE | Admit: 2016-07-08 | Discharge: 2016-07-08 | Disposition: A | Discharge status: Home / Self Care | Payer: 59 | Source: Ambulatory Visit | Attending: Family | Admitting: Family

## 2016-07-08 DIAGNOSIS — Z1231 Encounter for screening mammogram for malignant neoplasm of breast: Secondary | ICD-10-CM | POA: Diagnosis not present

## 2016-07-17 ENCOUNTER — Encounter: Payer: Self-pay | Admitting: Physician Assistant

## 2016-07-17 ENCOUNTER — Ambulatory Visit: Payer: Self-pay | Admitting: Physician Assistant

## 2016-07-17 VITALS — BP 104/60 | Temp 98.1°F

## 2016-07-17 DIAGNOSIS — R300 Dysuria: Secondary | ICD-10-CM

## 2016-07-17 LAB — POCT URINALYSIS DIPSTICK
BILIRUBIN UA: NEGATIVE
Blood, UA: NEGATIVE
Glucose, UA: NEGATIVE
KETONES UA: NEGATIVE
LEUKOCYTES UA: NEGATIVE
Nitrite, UA: NEGATIVE
Protein, UA: NEGATIVE
SPEC GRAV UA: 1.01
Urobilinogen, UA: NEGATIVE
pH, UA: 5

## 2016-07-17 MED ORDER — CIPROFLOXACIN HCL 500 MG PO TABS: 500.0000 mg | ORAL_TABLET | Freq: Two times a day (BID) | ORAL | 0 refills | Status: DC

## 2016-07-17 NOTE — Progress Notes (Signed)
S:  C/o uti sx for 1 days, tingling, frequency, denies vaginal discharge, abdominal pain or flank pain: no fever or chills, states this is how her utis start   Remainder ros neg  O:  Vitals wnl, nad, ua wnl  A: dysuria  P: cipro 500 bid x 3d if worsening,  increase water intake, add cranberry juice, return if not improving in 2 -3 days, return earlier if worsening, discussed pyelonephritis sx

## 2016-08-20 ENCOUNTER — Encounter: Payer: Self-pay | Admitting: Family

## 2016-09-10 ENCOUNTER — Ambulatory Visit (INDEPENDENT_AMBULATORY_CARE_PROVIDER_SITE_OTHER): Payer: 59 | Admitting: Family

## 2016-09-10 ENCOUNTER — Encounter: Payer: Self-pay | Admitting: Family

## 2016-09-10 VITALS — BP 126/74 | HR 73 | Temp 98.0°F | Ht 65.0 in | Wt 182.2 lb

## 2016-09-10 DIAGNOSIS — R21 Rash and other nonspecific skin eruption: Secondary | ICD-10-CM | POA: Diagnosis not present

## 2016-09-10 DIAGNOSIS — E119 Type 2 diabetes mellitus without complications: Secondary | ICD-10-CM | POA: Diagnosis not present

## 2016-09-10 DIAGNOSIS — I10 Essential (primary) hypertension: Secondary | ICD-10-CM | POA: Diagnosis not present

## 2016-09-10 LAB — COMPREHENSIVE METABOLIC PANEL
ALK PHOS: 45 U/L (ref 39–117)
ALT: 11 U/L (ref 0–35)
AST: 14 U/L (ref 0–37)
Albumin: 4.4 g/dL (ref 3.5–5.2)
BILIRUBIN TOTAL: 0.4 mg/dL (ref 0.2–1.2)
BUN: 15 mg/dL (ref 6–23)
CO2: 30 mEq/L (ref 19–32)
CREATININE: 0.72 mg/dL (ref 0.40–1.20)
Calcium: 9.7 mg/dL (ref 8.4–10.5)
Chloride: 103 mEq/L (ref 96–112)
GFR: 109.32 mL/min (ref >60.00)
Glucose, Bld: 145 mg/dL — ABNORMAL HIGH (ref 70–99)
Potassium: 4 mEq/L (ref 3.5–5.1)
SODIUM: 140 meq/L (ref 135–145)
Total Protein: 7.9 g/dL (ref 6.0–8.3)

## 2016-09-10 LAB — MICROALBUMIN / CREATININE URINE RATIO
CREATININE, U: 86.2 mg/dL
MICROALB/CREAT RATIO: 0.8 mg/g (ref 0.0–30.0)

## 2016-09-10 LAB — HEMOGLOBIN A1C: HEMOGLOBIN A1C: 6.9 % — AB (ref 4.6–6.5)

## 2016-09-10 MED ORDER — CLOTRIMAZOLE 1 % EX CREA: 1.0000 "application " | TOPICAL_CREAM | Freq: Two times a day (BID) | CUTANEOUS | 1 refills | Status: DC

## 2016-09-10 NOTE — Assessment & Plan Note (Signed)
Symptoms most consistent with tinea. Trial of antifungal to see if resolved. Patient will let me know if does not improve.

## 2016-09-10 NOTE — Patient Instructions (Addendum)
So proud of your new job!! Go you!  Labs and urine today. Suspect tinea rash, let me know if not better

## 2016-09-10 NOTE — Assessment & Plan Note (Signed)
Controlled. Continue current regimen. 

## 2016-09-10 NOTE — Progress Notes (Signed)
Pre visit review using our clinic review tool, if applicable. No additional management support is needed unless otherwise documented below in the visit note. 

## 2016-09-10 NOTE — Progress Notes (Signed)
Subjective:    Patient ID: Alyssa Schultz, female    DOB: [birth year], 51 y.o.   MRN: [id number]  CC: Alyssa Schultz is a 51 y.o. female who presents today for follow up.   HPI:  DM- Fasting BS, 135. In the evening 98. Following with wellness program.   HTN- compliant with medications. Denies exertional chest pain or pressure, numbness or tingling radiating to left arm or jaw, palpitations, dizziness, frequent headaches, changes in vision, or shortness of breath.   Rash on right wrist for past several months. Unchanged. No other places on body and hasnt spread. "Extremely itchy". Tried topical steriod cream with no relief. History of Candida skin infections. No fever, discharge from rash. No history of eczema, asthma. Unable to take PO steroids.   Of note, H/o GI ulcer which was noted in prior note; that was an error. Clarified today and NO history of GI ulcer.      HISTORY:  Past Medical History:  Diagnosis Date  . Diabetes mellitus    dx 2014   just take pills  . Hyperlipidemia   . Hypertension    Past Surgical History:  Procedure Laterality Date  . abalsion    . ablation    . ANTERIOR CERVICAL DECOMP/DISCECTOMY FUSION N/A 09/19/2015   Procedure: ANTERIOR CERVICAL DECOMPRESSION/DISCECTOMY FUSION 2 LEVELS;  Surgeon: Phylliss Bob, MD;  Location: [facility];  Service: Orthopedics;  Laterality: N/A;  Anterior cervical decompression fusion, cervical 3-4, cervical 4-5 decompression with instrumentation and allograft  . FACIAL RECONSTRUCTION SURGERY     Family History  Problem Relation Age of Onset  . Diabetes Mother   . Diabetes Brother   . Breast cancer Neg Hx     Allergies: Influenza vaccine live; Morphine and related; Prednisone; and Hydrocodone Current Outpatient Prescriptions on File Prior to Visit  Medication Sig Dispense Refill  . gabapentin (NEURONTIN) 100 MG capsule Take 1 capsule (100 mg total) by mouth 2 (two) times daily. 180 capsule 1  . gabapentin (NEURONTIN) 300 MG  capsule Take 1 capsule (300 mg total) by mouth 3 (three) times daily. 90 capsule 3  . glucose blood test strip Use as instructed 100 each 12  . hydrochlorothiazide (HYDRODIURIL) 25 MG tablet TAKE 1 TABLET (25 MG TOTAL) BY MOUTH DAILY. 90 tablet 3  . lisinopril (PRINIVIL,ZESTRIL) 10 MG tablet Take 1 tablet (10 mg total) by mouth daily. 90 tablet 1  . lubiprostone (AMITIZA) 24 MCG capsule Take 1 capsule (24 mcg total) by mouth daily with breakfast. 30 capsule 3  . meclizine (ANTIVERT) 50 MG tablet Take 1 tablet (50 mg total) by mouth 3 (three) times daily as needed for dizziness. 30 tablet 1  . meloxicam (MOBIC) 15 MG tablet Take 1 tablet (15 mg total) by mouth daily as needed for pain. 10 tablet 0  . meloxicam (MOBIC) 15 MG tablet Take 1 tablet (15 mg total) by mouth daily. 30 tablet 0  . metFORMIN (GLUCOPHAGE) 500 MG tablet TAKE 1 TABLET BY MOUTH 2 TIMES DAILY WITH A MEAL. 60 tablet 11  . saxagliptin HCl (ONGLYZA) 2.5 MG TABS tablet TAKE 1 TABLET (2.5 MG TOTAL) BY MOUTH DAILY. 30 tablet 5  . TRUE METRIX BLOOD GLUCOSE TEST test strip CHECK BLOOD SUGAR TWICE A DAY AND AS NEEDED 100 each PRN  . TRUEPLUS LANCETS 30G MISC USE AS DIRECTED 100 each 5   No current facility-administered medications on file prior to visit.     Social History  Substance Use Topics  .  Smoking status: Never Smoker  . Smokeless tobacco: Never Used  . Alcohol use Yes     Comment: Occasional wine    Review of Systems  Constitutional: Negative for chills and fever.  Respiratory: Negative for cough.   Cardiovascular: Negative for chest pain and palpitations.  Gastrointestinal: Negative for nausea and vomiting.  Skin: Positive for rash.      Objective:    BP 126/74   Pulse 73   Temp 98 F (36.7 C) (Oral)   Ht 5\' 5"  (1.651 m)   Wt 182 lb 3.2 oz (82.6 kg)   SpO2 98%   BMI 30.32 kg/m  BP Readings from Last 3 Encounters:  09/10/16 126/74  07/17/16 104/60  06/12/16 120/83   Wt Readings from Last 3 Encounters:   09/10/16 182 lb 3.2 oz (82.6 kg)  06/12/16 183 lb (83 kg)  06/04/16 183 lb (83 kg)    Physical Exam  Constitutional: She appears well-developed and well-nourished.  Eyes: Conjunctivae are normal.  Cardiovascular: Normal rate, regular rhythm, normal heart sounds and normal pulses.   Pulmonary/Chest: Effort normal and breath sounds normal. She has no wheezes. She has no rhonchi. She has no rales.  Neurological: She is alert.  Skin: Skin is warm and dry.     Localized area of darkened skin. No discharge, increased warmth. No papules, vesicles.  Psychiatric: She has a normal mood and affect. Her speech is normal and behavior is normal. Thought content normal.  Vitals reviewed.      Assessment & Plan:   Problem List Items Addressed This Visit      Cardiovascular and Mediastinum   Hypertension    Controlled. Continue current regimen.      Relevant Orders   Comprehensive metabolic panel     Endocrine   Diabetes mellitus type 2, controlled ([place]) - Primary    Controlled based on fasting blood sugars at home. Congratulated patient on exercise. Pending A1c, urine microalbumin, creatinine. We discussed coming off of the saxagliptin if A1c is same or better.      Relevant Orders   Hemoglobin A1c   Microalbumin / creatinine urine ratio     Musculoskeletal and Integument   Rash    Symptoms most consistent with tinea. Trial of antifungal to see if resolved. Patient will let me know if does not improve.      Relevant Medications   clotrimazole (LOTRIMIN) 1 % cream       I have discontinued Ms. Brunkhorst's ciprofloxacin. I am also having her start on clotrimazole. Additionally, I am having her maintain her lubiprostone, meclizine, gabapentin, gabapentin, hydrochlorothiazide, TRUE METRIX BLOOD GLUCOSE TEST, glucose blood, metFORMIN, meloxicam, TRUEPLUS LANCETS 30G, lisinopril, meloxicam, and saxagliptin HCl.   Meds ordered this encounter  Medications  . clotrimazole (LOTRIMIN) 1  % cream    Sig: Apply 1 application topically 2 (two) times daily.    Dispense:  30 g    Refill:  1    Order Specific Question:   Supervising Provider    Answer:   Crecencio Mc [[id number]]    Return precautions given.   Risks, benefits, and alternatives of the medications and treatment plan prescribed today were discussed, and patient expressed understanding.   Education regarding symptom management and diagnosis given to patient on AVS.  Continue to follow with Mable Paris, FNP for routine health maintenance.   Cephus Richer and I agreed with plan.   Mable Paris, FNP

## 2016-09-10 NOTE — Assessment & Plan Note (Signed)
Controlled based on fasting blood sugars at home. Congratulated patient on exercise. Pending A1c, urine microalbumin, creatinine. We discussed coming off of the saxagliptin if A1c is same or better.

## 2016-09-12 ENCOUNTER — Encounter: Payer: Self-pay | Admitting: Family

## 2016-09-14 ENCOUNTER — Other Ambulatory Visit: Payer: Self-pay

## 2016-09-14 MED ORDER — HYDROCHLOROTHIAZIDE 25 MG PO TABS: ORAL_TABLET | ORAL | 3 refills | Status: DC

## 2016-09-14 NOTE — Telephone Encounter (Signed)
Medication has been refilled.

## 2016-09-15 ENCOUNTER — Encounter: Payer: Self-pay | Admitting: Family

## 2016-10-02 ENCOUNTER — Telehealth: Payer: Self-pay

## 2016-10-02 NOTE — Patient Outreach (Signed)
Called Alyssa Schultz, to get her scheduled with [facility] or [facility].  There was no answer, left message for her to give me a call back.

## 2016-10-06 ENCOUNTER — Encounter: Payer: Self-pay | Admitting: Physician Assistant

## 2016-10-06 ENCOUNTER — Ambulatory Visit: Payer: Self-pay | Admitting: Physician Assistant

## 2016-10-06 VITALS — BP 110/80 | HR 76 | Temp 98.3°F

## 2016-10-06 DIAGNOSIS — H1031 Unspecified acute conjunctivitis, right eye: Secondary | ICD-10-CM

## 2016-10-06 MED ORDER — TOBRAMYCIN 0.3 % OP SOLN: 2.0000 [drp] | OPHTHALMIC | 0 refills | Status: DC

## 2016-10-06 NOTE — Progress Notes (Signed)
S:  C/o r eye being irritated, matted, itching; sx started last pm, denies, cough, congestion, fever, chills, v/d; remainder ros neg  O: vitals wnl, nad, perrl eomi, r eye with injected conjunctiva, no drainage or matting noted at this time, tms clear, nasal mucosa inflamed, throat wnl, neck supple no lymph, lungs c t a, cv rrr  A:  Acute conjunctivitis  P: tobramycin opth gtts, return if not better in 3 - 5d, if worsening return earlier or see eye doctor, no work until eye is no longer red or draining

## 2016-10-20 ENCOUNTER — Other Ambulatory Visit: Payer: Self-pay

## 2016-10-20 MED ORDER — METFORMIN HCL 500 MG PO TABS: 500.0000 mg | ORAL_TABLET | Freq: Two times a day (BID) | ORAL | 6 refills | Status: DC

## 2016-11-04 ENCOUNTER — Telehealth: Payer: Self-pay | Admitting: Obstetrics & Gynecology

## 2016-11-04 NOTE — Telephone Encounter (Signed)
-----   Message from Reesa Chew sent at 11/03/2016  4:05 PM EDT ----- Regarding: Schedule Referral Appt Please schedule patient a referral appt.  Referral information is under appt desk/referrals.  Thank you, Solmon Ice

## 2016-11-04 NOTE — Telephone Encounter (Signed)
Called and left voicemail for pt to call back to be schedule Due to an referral from [facility] for an Consult.

## 2016-11-05 NOTE — Telephone Encounter (Signed)
Left vm for pt to call to be schedule call 2x

## 2016-11-06 ENCOUNTER — Encounter: Payer: Self-pay | Admitting: Family

## 2016-11-06 NOTE — Telephone Encounter (Signed)
Pt called and stated she wants to be referred to an different obgyn. Will call back to schedule.

## 2016-11-11 ENCOUNTER — Other Ambulatory Visit: Payer: Self-pay | Admitting: Family

## 2016-11-13 ENCOUNTER — Other Ambulatory Visit: Payer: Self-pay | Admitting: Family Medicine

## 2016-11-13 ENCOUNTER — Encounter: Payer: Self-pay | Admitting: Family

## 2016-11-16 MED ORDER — LISINOPRIL 10 MG PO TABS: 10.0000 mg | ORAL_TABLET | Freq: Every day | ORAL | 1 refills | Status: DC

## 2016-11-16 NOTE — Telephone Encounter (Signed)
Medication has been refilled.

## 2016-11-17 NOTE — Telephone Encounter (Signed)
Not our patient

## 2016-11-20 ENCOUNTER — Telehealth: Payer: Self-pay

## 2016-11-20 DIAGNOSIS — M4712 Other spondylosis with myelopathy, cervical region: Secondary | ICD-10-CM

## 2016-11-20 DIAGNOSIS — G959 Disease of spinal cord, unspecified: Secondary | ICD-10-CM

## 2016-11-20 NOTE — Telephone Encounter (Signed)
Refill request for Mobic, last seen [id number], last filled [id number].  Please advise.

## 2016-11-22 ENCOUNTER — Encounter: Payer: Self-pay | Admitting: Family

## 2016-11-22 MED ORDER — MELOXICAM 15 MG PO TABS: 15.0000 mg | ORAL_TABLET | Freq: Every day | ORAL | 1 refills | Status: DC

## 2016-11-22 NOTE — Addendum Note (Signed)
Addended by: Burnard Hawthorne on: 11/22/2016 08:19 AM   Modules accepted: Orders

## 2016-11-30 ENCOUNTER — Ambulatory Visit: Payer: Self-pay | Admitting: Family

## 2016-12-03 ENCOUNTER — Encounter: Payer: Self-pay | Admitting: Family

## 2016-12-03 ENCOUNTER — Ambulatory Visit: Payer: Self-pay | Admitting: Family

## 2016-12-03 ENCOUNTER — Ambulatory Visit (INDEPENDENT_AMBULATORY_CARE_PROVIDER_SITE_OTHER): Payer: 59 | Admitting: Family

## 2016-12-03 VITALS — BP 106/60 | HR 84 | Temp 98.3°F | Ht 65.0 in | Wt 175.0 lb

## 2016-12-03 DIAGNOSIS — J40 Bronchitis, not specified as acute or chronic: Secondary | ICD-10-CM

## 2016-12-03 DIAGNOSIS — I10 Essential (primary) hypertension: Secondary | ICD-10-CM

## 2016-12-03 DIAGNOSIS — E119 Type 2 diabetes mellitus without complications: Secondary | ICD-10-CM

## 2016-12-03 LAB — COMPREHENSIVE METABOLIC PANEL
ALT: 11 U/L (ref 0–35)
AST: 15 U/L (ref 0–37)
Albumin: 4.6 g/dL (ref 3.5–5.2)
Alkaline Phosphatase: 43 U/L (ref 39–117)
BILIRUBIN TOTAL: 0.4 mg/dL (ref 0.2–1.2)
BUN: 15 mg/dL (ref 6–23)
CALCIUM: 10 mg/dL (ref 8.4–10.5)
CO2: 31 meq/L (ref 19–32)
CREATININE: 0.83 mg/dL (ref 0.40–1.20)
Chloride: 101 mEq/L (ref 96–112)
GFR: 92.69 mL/min (ref >60.00)
GLUCOSE: 98 mg/dL (ref 70–99)
Potassium: 3.4 mEq/L — ABNORMAL LOW (ref 3.5–5.1)
Sodium: 140 mEq/L (ref 135–145)
Total Protein: 8.2 g/dL (ref 6.0–8.3)

## 2016-12-03 LAB — CBC WITH DIFFERENTIAL/PLATELET
BASOS ABS: 0.1 10*3/uL (ref 0.0–0.1)
BASOS PCT: 0.9 % (ref 0.0–3.0)
EOS PCT: 2.1 % (ref 0.0–5.0)
Eosinophils Absolute: 0.2 10*3/uL (ref 0.0–0.7)
HEMATOCRIT: 41.6 % (ref 36.0–46.0)
Hemoglobin: 13.6 g/dL (ref 12.0–15.0)
LYMPHS ABS: 2.6 10*3/uL (ref 0.7–4.0)
Lymphocytes Relative: 28.3 % (ref 12.0–46.0)
MCHC: 32.7 g/dL (ref 30.0–36.0)
MCV: 85.4 fl (ref 78.0–100.0)
MONOS PCT: 8.1 % (ref 3.0–12.0)
Monocytes Absolute: 0.7 10*3/uL (ref 0.1–1.0)
NEUTROS ABS: 5.5 10*3/uL (ref 1.4–7.7)
Neutrophils Relative %: 60.6 % (ref 43.0–77.0)
PLATELETS: 318 10*3/uL (ref 150.0–400.0)
RBC: 4.87 Mil/uL (ref 3.87–5.11)
RDW: 14.7 % (ref 11.5–15.5)
WBC: 9.1 10*3/uL (ref 4.0–10.5)

## 2016-12-03 LAB — LIPID PANEL
CHOL/HDL RATIO: 3
Cholesterol: 228 mg/dL — ABNORMAL HIGH (ref 0–200)
HDL: 67.7 mg/dL (ref >39.00)
LDL CALC: 144 mg/dL — AB (ref 0–99)
NONHDL: 160.14
Triglycerides: 83 mg/dL (ref 0.0–149.0)
VLDL: 16.6 mg/dL (ref 0.0–40.0)

## 2016-12-03 LAB — MICROALBUMIN / CREATININE URINE RATIO
Creatinine,U: 85.8 mg/dL
MICROALB UR: 0.8 mg/dL (ref 0.0–1.9)
Microalb Creat Ratio: 0.9 mg/g (ref 0.0–30.0)

## 2016-12-03 LAB — TSH: TSH: 2.49 u[IU]/mL (ref 0.35–4.50)

## 2016-12-03 LAB — VITAMIN D 25 HYDROXY (VIT D DEFICIENCY, FRACTURES): VITD: 22.36 ng/mL — AB (ref 30.00–100.00)

## 2016-12-03 LAB — HEMOGLOBIN A1C: Hgb A1c MFr Bld: 6.6 % — ABNORMAL HIGH (ref 4.6–6.5)

## 2016-12-03 MED ORDER — HYDROCODONE-HOMATROPINE 5-1.5 MG/5ML PO SYRP: 5.0000 mL | ORAL_SOLUTION | Freq: Every evening | ORAL | 0 refills | Status: DC | PRN

## 2016-12-03 NOTE — Progress Notes (Signed)
Pre visit review using our clinic review tool, if applicable. No additional management support is needed unless otherwise documented below in the visit note. 

## 2016-12-03 NOTE — Assessment & Plan Note (Signed)
Controlled. Continue current regimen. 

## 2016-12-03 NOTE — Assessment & Plan Note (Signed)
Suspect viral etiology. sa02 97. Cough syrup prn. If no improvement, consider trial stop lisinopril.

## 2016-12-03 NOTE — Progress Notes (Signed)
Subjective:    Patient ID: Alyssa Schultz, female    DOB: April 20, 1966, 51 y.o.   MRN: [id number]  CC: Alyssa Schultz is a 51 y.o. female who presents today for follow up.   HPI: Multiple complaints  Congestion,  Dry cough one week, unchanged. No sinus pain, ear pain, sore throat.  On lisinopril. Using robitussin with no rlief.   DM- fasting BS 102. Occasional numbness in right hand and also right toe, has stopped using gabapentin HTN- compliant with medication. Denies exertional chest pain or pressure, numbness or tingling radiating to left arm or jaw, palpitations, dizziness, frequent headaches, changes in vision, or shortness of breath.    Walking more and has lost weight        HISTORY:  Past Medical History:  Diagnosis Date  . Diabetes mellitus    dx 2014   just take pills  . Hyperlipidemia   . Hypertension    Past Surgical History:  Procedure Laterality Date  . abalsion    . ablation    . ANTERIOR CERVICAL DECOMP/DISCECTOMY FUSION N/A 09/19/2015   Procedure: ANTERIOR CERVICAL DECOMPRESSION/DISCECTOMY FUSION 2 LEVELS;  Surgeon: Phylliss Bob, MD;  Location: [facility];  Service: Orthopedics;  Laterality: N/A;  Anterior cervical decompression fusion, cervical 3-4, cervical 4-5 decompression with instrumentation and allograft  . FACIAL RECONSTRUCTION SURGERY     Family History  Problem Relation Age of Onset  . Diabetes Mother   . Diabetes Brother   . Breast cancer Neg Hx     Allergies: Influenza vaccine live; Morphine and related; Prednisone; and Hydrocodone Current Outpatient Prescriptions on File Prior to Visit  Medication Sig Dispense Refill  . gabapentin (NEURONTIN) 100 MG capsule Take 1 capsule (100 mg total) by mouth 2 (two) times daily. 180 capsule 1  . gabapentin (NEURONTIN) 300 MG capsule Take 1 capsule (300 mg total) by mouth 3 (three) times daily. 90 capsule 3  . glucose blood test strip Use as instructed 100 each 12  . hydrochlorothiazide (HYDRODIURIL) 25 MG  tablet TAKE 1 TABLET (25 MG TOTAL) BY MOUTH DAILY. 90 tablet 3  . lisinopril (PRINIVIL,ZESTRIL) 10 MG tablet Take 1 tablet (10 mg total) by mouth daily. 90 tablet 1  . meclizine (ANTIVERT) 50 MG tablet Take 1 tablet (50 mg total) by mouth 3 (three) times daily as needed for dizziness. 30 tablet 1  . meloxicam (MOBIC) 15 MG tablet Take 1 tablet (15 mg total) by mouth daily. 90 tablet 1  . metFORMIN (GLUCOPHAGE) 500 MG tablet Take 1 tablet (500 mg total) by mouth 2 (two) times daily with a meal. 60 tablet 6  . saxagliptin HCl (ONGLYZA) 2.5 MG TABS tablet TAKE 1 TABLET (2.5 MG TOTAL) BY MOUTH DAILY. 30 tablet 5  . tobramycin (TOBREX) 0.3 % ophthalmic solution Place 2 drops into both eyes every 4 (four) hours. 5 mL 0  . TRUE METRIX BLOOD GLUCOSE TEST test strip CHECK BLOOD SUGAR TWICE A DAY AND AS NEEDED 100 each PRN  . TRUEPLUS LANCETS 30G MISC USE AS DIRECTED 100 each 5   No current facility-administered medications on file prior to visit.     Social History  Substance Use Topics  . Smoking status: Never Smoker  . Smokeless tobacco: Never Used  . Alcohol use Yes     Comment: Occasional wine    Review of Systems  Constitutional: Negative for chills, fever and unexpected weight change.  HENT: Positive for congestion. Negative for sinus pressure and sore throat.  Eyes: Negative for visual disturbance.  Respiratory: Positive for cough. Negative for shortness of breath and wheezing.   Cardiovascular: Negative for chest pain, palpitations and leg swelling.  Gastrointestinal: Negative for nausea and vomiting.  Skin: Negative for rash.  Neurological: Positive for numbness. Negative for headaches.      Objective:    BP 106/60   Pulse 84   Temp 98.3 F (36.8 C) (Oral)   Ht 5\' 5"  (1.651 m)   Wt 175 lb (79.4 kg)   SpO2 97%   BMI 29.12 kg/m  BP Readings from Last 3 Encounters:  12/03/16 106/60  10/06/16 110/80  09/10/16 126/74   Wt Readings from Last 3 Encounters:  12/03/16 175 lb  (79.4 kg)  09/10/16 182 lb 3.2 oz (82.6 kg)  06/12/16 183 lb (83 kg)    Physical Exam  Constitutional: She appears well-developed and well-nourished.  HENT:  Head: Normocephalic and atraumatic.  Right Ear: Hearing, tympanic membrane, external ear and ear canal normal. No drainage, swelling or tenderness. No foreign bodies. Tympanic membrane is not erythematous and not bulging. No middle ear effusion. No decreased hearing is noted.  Left Ear: Hearing, tympanic membrane, external ear and ear canal normal. No drainage, swelling or tenderness. No foreign bodies. Tympanic membrane is not erythematous and not bulging.  No middle ear effusion. No decreased hearing is noted.  Nose: Nose normal. No rhinorrhea. Right sinus exhibits no maxillary sinus tenderness and no frontal sinus tenderness. Left sinus exhibits no maxillary sinus tenderness and no frontal sinus tenderness.  Mouth/Throat: Uvula is midline, oropharynx is clear and moist and mucous membranes are normal. No oropharyngeal exudate, posterior oropharyngeal edema, posterior oropharyngeal erythema or tonsillar abscesses.  Eyes: Conjunctivae are normal.  Cardiovascular: Normal rate, regular rhythm, normal heart sounds and normal pulses.   Pulmonary/Chest: Effort normal and breath sounds normal. She has no wheezes. She has no rhonchi. She has no rales.  Lymphadenopathy:       Head (right side): No submental, no submandibular, no tonsillar, no preauricular, no posterior auricular and no occipital adenopathy present.       Head (left side): No submental, no submandibular, no tonsillar, no preauricular, no posterior auricular and no occipital adenopathy present.    She has no cervical adenopathy.  Neurological: She is alert.  Skin: Skin is warm and dry.  Psychiatric: She has a normal mood and affect. Her speech is normal and behavior is normal. Thought content normal.  Vitals reviewed.      Assessment & Plan:   Problem List Items Addressed  This Visit      Cardiovascular and Mediastinum   Hypertension - Primary    Controlled. Continue current regimen      Relevant Orders   CBC with Differential/Platelet   Comprehensive metabolic panel   Hemoglobin A1c   Lipid panel   Microalbumin / creatinine urine ratio   TSH   VITAMIN D 25 Hydroxy (Vit-D Deficiency, Fractures)     Respiratory   Bronchitis    Suspect viral etiology. sa02 97. Cough syrup prn. If no improvement, consider trial stop lisinopril.       Relevant Medications   HYDROcodone-homatropine (HYCODAN) 5-1.5 MG/5ML syrup     Endocrine   Diabetes mellitus type 2, controlled ([place])    Pending a1c with physical labs. 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 Will let me know if not better          I have discontinued Ms. Weekes's lubiprostone and clotrimazole. I am  also having her start on HYDROcodone-homatropine. Additionally, I am having her maintain her meclizine, gabapentin, gabapentin, TRUE METRIX BLOOD GLUCOSE TEST, glucose blood, TRUEPLUS LANCETS 30G, saxagliptin HCl, hydrochlorothiazide, tobramycin, metFORMIN, lisinopril, and meloxicam.   Meds ordered this encounter  Medications  . HYDROcodone-homatropine (HYCODAN) 5-1.5 MG/5ML syrup    Sig: Take 5 mLs by mouth at bedtime as needed for cough.    Dispense:  30 mL    Refill:  0    Order Specific Question:   Supervising Provider    Answer:   Crecencio Mc [[id number]]    Return precautions given.   Risks, benefits, and alternatives of the medications and treatment plan prescribed today were discussed, and patient expressed understanding.   Education regarding symptom management and diagnosis given to patient on AVS.  Continue to follow with Mable Paris, FNP for routine health maintenance.   Cephus Richer and I agreed with plan.   Mable Paris, FNP

## 2016-12-03 NOTE — Patient Instructions (Signed)
Start gabapentin again for numbness  Labs today  Return for physical later this year  Let me know if cough doesn't get better

## 2016-12-03 NOTE — Assessment & Plan Note (Addendum)
Pending a1c with physical labs. 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 Will let me know if not better

## 2016-12-07 ENCOUNTER — Encounter: Payer: Self-pay | Admitting: Family

## 2016-12-07 ENCOUNTER — Other Ambulatory Visit: Payer: Self-pay | Admitting: Family

## 2016-12-07 ENCOUNTER — Telehealth: Payer: Self-pay | Admitting: Family

## 2016-12-07 DIAGNOSIS — M79602 Pain in left arm: Secondary | ICD-10-CM

## 2016-12-07 DIAGNOSIS — E876 Hypokalemia: Secondary | ICD-10-CM

## 2016-12-07 NOTE — Telephone Encounter (Signed)
Left message for patient to return call back.  

## 2016-12-07 NOTE — Telephone Encounter (Signed)
Call pt  She didn't get mychart 3 days ago  Alyssa Schultz,   Your mobic has been refilled and it at [facility].   Hope you are well.   [name]

## 2016-12-07 NOTE — Telephone Encounter (Signed)
Patient has been notified

## 2016-12-10 ENCOUNTER — Other Ambulatory Visit: Payer: Self-pay

## 2016-12-10 MED ORDER — GABAPENTIN 100 MG PO CAPS: 100.0000 mg | ORAL_CAPSULE | Freq: Two times a day (BID) | ORAL | 1 refills | Status: DC

## 2016-12-10 MED ORDER — GABAPENTIN 300 MG PO CAPS: 300.0000 mg | ORAL_CAPSULE | Freq: Three times a day (TID) | ORAL | 3 refills | Status: DC

## 2016-12-10 NOTE — Telephone Encounter (Signed)
Medication has been refilled. 100 MG during the day and 300MG  at night

## 2016-12-11 ENCOUNTER — Other Ambulatory Visit: Payer: Self-pay

## 2016-12-11 ENCOUNTER — Other Ambulatory Visit
Admission: RE | Admit: 2016-12-11 | Discharge: 2016-12-11 | Disposition: A | Discharge status: Home / Self Care | Payer: 59 | Source: Ambulatory Visit | Attending: Family | Admitting: Family

## 2016-12-11 DIAGNOSIS — E876 Hypokalemia: Secondary | ICD-10-CM | POA: Diagnosis not present

## 2016-12-11 LAB — BASIC METABOLIC PANEL
ANION GAP: 9 (ref 5–15)
BUN: 18 mg/dL (ref 6–20)
CALCIUM: 9.6 mg/dL (ref 8.9–10.3)
CO2: 27 mmol/L (ref 22–32)
Chloride: 102 mmol/L (ref 101–111)
Creatinine, Ser: 0.83 mg/dL (ref 0.44–1.00)
Glucose, Bld: 87 mg/dL (ref 65–99)
POTASSIUM: 3.6 mmol/L (ref 3.5–5.1)
SODIUM: 138 mmol/L (ref 135–145)

## 2016-12-28 DIAGNOSIS — H5213 Myopia, bilateral: Secondary | ICD-10-CM | POA: Diagnosis not present

## 2016-12-28 DIAGNOSIS — H524 Presbyopia: Secondary | ICD-10-CM | POA: Diagnosis not present

## 2017-01-19 ENCOUNTER — Encounter: Payer: Self-pay | Admitting: Physician Assistant

## 2017-01-19 ENCOUNTER — Ambulatory Visit: Payer: Self-pay | Admitting: Physician Assistant

## 2017-01-19 VITALS — BP 125/75 | HR 76 | Temp 98.4°F

## 2017-01-19 DIAGNOSIS — R42 Dizziness and giddiness: Secondary | ICD-10-CM

## 2017-01-19 NOTE — Progress Notes (Signed)
S: pt c/o being dizzy and feels like the room is spinning, [place] this [place], when she got home she felt a little light headed, didn't drink a lot of water while she was gone, no cp/sob, no head injury  O: vitals wnl, nad, perrl eomi, 10 beats nystagmus b/l on horizontal plane, and + when comes back to center, tms dull,  neck supple no lymph, lungs c t a,c v rrr, performed maneuver to help with vertigo with patient lying down with head over edge of bed, pt tolerated procedure well, states she feels much better, no nystagmus after procedure  A: acute vertigo  P: continue meclizine, drink plenty of water, flonase for fluid in ears

## 2017-02-15 ENCOUNTER — Ambulatory Visit: Payer: Self-pay | Admitting: Family

## 2017-02-15 VITALS — BP 120/70 | HR 88 | Temp 98.2°F | Resp 16

## 2017-02-15 DIAGNOSIS — R1900 Intra-abdominal and pelvic swelling, mass and lump, unspecified site: Secondary | ICD-10-CM

## 2017-02-15 LAB — POCT URINALYSIS DIPSTICK
BILIRUBIN UA: NEGATIVE
Glucose, UA: NEGATIVE
KETONES UA: NEGATIVE
Leukocytes, UA: NEGATIVE
Nitrite, UA: NEGATIVE
PH UA: 5.5 (ref 5.0–8.0)
Protein, UA: NEGATIVE
RBC UA: NEGATIVE
Spec Grav, UA: 1.02 (ref 1.010–1.025)
Urobilinogen, UA: 0.2 E.U./dL

## 2017-02-15 MED ORDER — CIPROFLOXACIN HCL 250 MG PO TABS: 250.0000 mg | ORAL_TABLET | Freq: Two times a day (BID) | ORAL | 0 refills | Status: DC

## 2017-02-15 NOTE — Progress Notes (Signed)
Patient presents with what she describes are early symptoms of UTI her urine is negative. Rx for Cipro 250 mg 1 twice a day for 3 days sent and follow-up when necessary

## 2017-02-20 ENCOUNTER — Encounter: Payer: Self-pay | Admitting: Family

## 2017-03-02 ENCOUNTER — Other Ambulatory Visit: Payer: Self-pay | Admitting: Family

## 2017-03-02 DIAGNOSIS — E119 Type 2 diabetes mellitus without complications: Secondary | ICD-10-CM

## 2017-03-02 MED ORDER — FREESTYLE LIBRE SENSOR SYSTEM MISC: 1.0000 | Freq: Three times a day (TID) | 0 refills | Status: DC

## 2017-03-23 ENCOUNTER — Encounter: Payer: Self-pay | Admitting: Physician Assistant

## 2017-03-23 ENCOUNTER — Ambulatory Visit: Payer: Self-pay | Admitting: Physician Assistant

## 2017-03-23 VITALS — BP 100/74 | HR 90 | Temp 98.4°F

## 2017-03-23 DIAGNOSIS — R109 Unspecified abdominal pain: Secondary | ICD-10-CM

## 2017-03-23 NOTE — Progress Notes (Signed)
S:  C/o uti sx for 4 days, denies burning, urgency, frequency, denies vaginal discharge, abdominal pain, states the pain is in the lower back and flank area, took 3 days of left over cipro and feels much better:  Remainder ros neg  O:  Vitals wnl, nad, no cva tenderness, back tender at si joint, ua wnl,   A: flank pain  P: increase water intake, add cranberry juice, return if not improving in 2 -3 days, return earlier if worsening, discussed pyelonephritis sx

## 2017-03-24 DIAGNOSIS — M9903 Segmental and somatic dysfunction of lumbar region: Secondary | ICD-10-CM | POA: Diagnosis not present

## 2017-03-24 DIAGNOSIS — M6080 Other myositis, unspecified site: Secondary | ICD-10-CM | POA: Diagnosis not present

## 2017-03-24 DIAGNOSIS — M9905 Segmental and somatic dysfunction of pelvic region: Secondary | ICD-10-CM | POA: Diagnosis not present

## 2017-03-24 DIAGNOSIS — M62830 Muscle spasm of back: Secondary | ICD-10-CM | POA: Diagnosis not present

## 2017-03-24 DIAGNOSIS — M5442 Lumbago with sciatica, left side: Secondary | ICD-10-CM | POA: Diagnosis not present

## 2017-03-24 DIAGNOSIS — M461 Sacroiliitis, not elsewhere classified: Secondary | ICD-10-CM | POA: Diagnosis not present

## 2017-03-24 DIAGNOSIS — M9902 Segmental and somatic dysfunction of thoracic region: Secondary | ICD-10-CM | POA: Diagnosis not present

## 2017-03-24 DIAGNOSIS — M955 Acquired deformity of pelvis: Secondary | ICD-10-CM | POA: Diagnosis not present

## 2017-03-24 DIAGNOSIS — M546 Pain in thoracic spine: Secondary | ICD-10-CM | POA: Diagnosis not present

## 2017-03-26 ENCOUNTER — Encounter: Payer: Self-pay | Admitting: Physician Assistant

## 2017-03-26 ENCOUNTER — Ambulatory Visit: Payer: Self-pay | Admitting: Physician Assistant

## 2017-03-26 VITALS — BP 100/70 | HR 88 | Temp 98.3°F

## 2017-03-26 DIAGNOSIS — S90221A Contusion of right lesser toe(s) with damage to nail, initial encounter: Secondary | ICD-10-CM

## 2017-03-26 NOTE — Progress Notes (Signed)
S: recently had a pedicure, doesn't remember an injury to the toe, did not have a bruise on the toe on Saturday, yesterday noticed the discoloration, no drainage or redness, doesn't hurt  O: vitals wnl, nad, skin intact, + ?bruise on nailbed, no redness swelling or increased warmth, n/v intact  A: toenail bruise  P: reassurance, if not better in 1-2 weeks pt should see dermatology

## 2017-04-15 DIAGNOSIS — M4716 Other spondylosis with myelopathy, lumbar region: Secondary | ICD-10-CM | POA: Diagnosis not present

## 2017-04-20 ENCOUNTER — Other Ambulatory Visit: Payer: Self-pay | Admitting: Physician Assistant

## 2017-04-20 DIAGNOSIS — M4716 Other spondylosis with myelopathy, lumbar region: Secondary | ICD-10-CM

## 2017-04-23 ENCOUNTER — Ambulatory Visit
Admission: RE | Admit: 2017-04-23 | Discharge: 2017-04-23 | Disposition: A | Discharge status: Home / Self Care | Payer: 59 | Source: Ambulatory Visit | Attending: Physician Assistant | Admitting: Physician Assistant

## 2017-04-23 DIAGNOSIS — M5126 Other intervertebral disc displacement, lumbar region: Secondary | ICD-10-CM | POA: Insufficient documentation

## 2017-04-23 DIAGNOSIS — M545 Low back pain: Secondary | ICD-10-CM | POA: Diagnosis not present

## 2017-04-23 DIAGNOSIS — M48061 Spinal stenosis, lumbar region without neurogenic claudication: Secondary | ICD-10-CM | POA: Insufficient documentation

## 2017-04-23 DIAGNOSIS — M4716 Other spondylosis with myelopathy, lumbar region: Secondary | ICD-10-CM | POA: Insufficient documentation

## 2017-04-23 DIAGNOSIS — M5416 Radiculopathy, lumbar region: Secondary | ICD-10-CM | POA: Diagnosis not present

## 2017-04-23 DIAGNOSIS — M5127 Other intervertebral disc displacement, lumbosacral region: Secondary | ICD-10-CM | POA: Insufficient documentation

## 2017-04-26 DIAGNOSIS — M5416 Radiculopathy, lumbar region: Secondary | ICD-10-CM | POA: Diagnosis not present

## 2017-04-28 ENCOUNTER — Telehealth: Payer: 59 | Admitting: Family

## 2017-04-28 ENCOUNTER — Telehealth: Payer: Self-pay | Admitting: Physician Assistant

## 2017-04-28 DIAGNOSIS — B3731 Acute candidiasis of vulva and vagina: Secondary | ICD-10-CM

## 2017-04-28 DIAGNOSIS — B373 Candidiasis of vulva and vagina: Secondary | ICD-10-CM | POA: Diagnosis not present

## 2017-04-28 MED ORDER — FLUCONAZOLE 150 MG PO TABS: 150.0000 mg | ORAL_TABLET | ORAL | 0 refills | Status: DC | PRN

## 2017-04-28 NOTE — Progress Notes (Signed)

## 2017-04-28 NOTE — Telephone Encounter (Signed)
Contacted patient  Back and informed her that she will have to contact GYN and try yogurt and probiotic in the meanwhile. Patient acknowledge understanding.

## 2017-04-28 NOTE — Telephone Encounter (Signed)
Since we did not prescribe the medication I cannot treat her yeast infection. She should see her gyn, eat yogurt, and try a probiotic in the meantime

## 2017-04-30 ENCOUNTER — Encounter: Payer: Self-pay | Admitting: Family

## 2017-05-05 DIAGNOSIS — M5416 Radiculopathy, lumbar region: Secondary | ICD-10-CM | POA: Diagnosis not present

## 2017-05-06 ENCOUNTER — Telehealth: Payer: Self-pay

## 2017-05-06 NOTE — Telephone Encounter (Signed)
Medication has been added to chart.

## 2017-05-19 ENCOUNTER — Other Ambulatory Visit: Payer: Self-pay | Admitting: Family

## 2017-05-19 ENCOUNTER — Encounter: Payer: Self-pay | Admitting: Family

## 2017-05-19 DIAGNOSIS — M5416 Radiculopathy, lumbar region: Secondary | ICD-10-CM | POA: Diagnosis not present

## 2017-05-19 DIAGNOSIS — I10 Essential (primary) hypertension: Secondary | ICD-10-CM

## 2017-05-19 DIAGNOSIS — E119 Type 2 diabetes mellitus without complications: Secondary | ICD-10-CM

## 2017-05-25 ENCOUNTER — Encounter: Payer: Self-pay | Admitting: Family

## 2017-06-01 ENCOUNTER — Other Ambulatory Visit
Admission: RE | Admit: 2017-06-01 | Discharge: 2017-06-01 | Disposition: A | Discharge status: Home / Self Care | Payer: 59 | Source: Ambulatory Visit | Attending: Family | Admitting: Family

## 2017-06-01 ENCOUNTER — Encounter: Payer: Self-pay | Admitting: Family

## 2017-06-01 DIAGNOSIS — E119 Type 2 diabetes mellitus without complications: Secondary | ICD-10-CM | POA: Diagnosis not present

## 2017-06-01 DIAGNOSIS — I10 Essential (primary) hypertension: Secondary | ICD-10-CM | POA: Insufficient documentation

## 2017-06-01 LAB — BASIC METABOLIC PANEL
Anion gap: 9 (ref 5–15)
BUN: 15 mg/dL (ref 6–20)
CHLORIDE: 102 mmol/L (ref 101–111)
CO2: 27 mmol/L (ref 22–32)
CREATININE: 0.82 mg/dL (ref 0.44–1.00)
Calcium: 9.7 mg/dL (ref 8.9–10.3)
Glucose, Bld: 154 mg/dL — ABNORMAL HIGH (ref 65–99)
Potassium: 3.8 mmol/L (ref 3.5–5.1)
SODIUM: 138 mmol/L (ref 135–145)

## 2017-06-01 LAB — HEMOGLOBIN A1C
HEMOGLOBIN A1C: 7 % — AB (ref 4.8–5.6)
MEAN PLASMA GLUCOSE: 154.2 mg/dL

## 2017-06-01 NOTE — Telephone Encounter (Signed)
Called and left msg for pt. Slot saved for 10/25 @ 10.

## 2017-06-01 NOTE — Telephone Encounter (Signed)
I have called and left another message for the patient in regards to an appt that I have available on 10/25.

## 2017-06-02 DIAGNOSIS — M5416 Radiculopathy, lumbar region: Secondary | ICD-10-CM | POA: Diagnosis not present

## 2017-06-03 ENCOUNTER — Encounter: Payer: Self-pay | Admitting: Family

## 2017-06-03 ENCOUNTER — Ambulatory Visit (INDEPENDENT_AMBULATORY_CARE_PROVIDER_SITE_OTHER): Payer: 59 | Admitting: Family

## 2017-06-03 VITALS — BP 120/70 | HR 73 | Temp 98.2°F | Ht 65.0 in | Wt 175.0 lb

## 2017-06-03 DIAGNOSIS — E119 Type 2 diabetes mellitus without complications: Secondary | ICD-10-CM

## 2017-06-03 DIAGNOSIS — Z1211 Encounter for screening for malignant neoplasm of colon: Secondary | ICD-10-CM

## 2017-06-03 DIAGNOSIS — M4712 Other spondylosis with myelopathy, cervical region: Secondary | ICD-10-CM

## 2017-06-03 DIAGNOSIS — M5412 Radiculopathy, cervical region: Secondary | ICD-10-CM

## 2017-06-03 DIAGNOSIS — Z1231 Encounter for screening mammogram for malignant neoplasm of breast: Secondary | ICD-10-CM | POA: Diagnosis not present

## 2017-06-03 DIAGNOSIS — I10 Essential (primary) hypertension: Secondary | ICD-10-CM | POA: Diagnosis not present

## 2017-06-03 DIAGNOSIS — G959 Disease of spinal cord, unspecified: Secondary | ICD-10-CM

## 2017-06-03 DIAGNOSIS — Z1239 Encounter for other screening for malignant neoplasm of breast: Secondary | ICD-10-CM

## 2017-06-03 NOTE — Assessment & Plan Note (Signed)
Ordered

## 2017-06-03 NOTE — Assessment & Plan Note (Signed)
Improving. Following with [facility]. We'll follow

## 2017-06-03 NOTE — Progress Notes (Signed)
Subjective:    Patient ID: Alyssa Schultz, female    DOB: 1965-11-04, 51 y.o.   MRN: [id number]  CC: Alyssa Schultz is a 51 y.o. female who presents today for follow up.   HPI: Here for follow up  Feeling well, no new complaints.   Chronic low back and left sciatic pain- Follows with Dr Jacelyn Grip, [facility]ue . improved. Cyst L5-S1. Has been getting steroid injections.   DM- compliant with medication however knows a1c higher because of pain and steriods of late.   HTN- compliant with medication. Do not use Q-tips in the ear. Use Hydrogen Peroxide or Debrox drops weekly as needed for cerumen build up. Never stick anything into the ear canal. Return to clinic if thin puss drains out or if foul odor comes from ear.  Due for mammogram and colonoscopy. Would also like referral to follow with GYN for women care.       HISTORY:  Past Medical History:  Diagnosis Date  . Diabetes mellitus    dx 2014   just take pills  . Hyperlipidemia   . Hypertension    Past Surgical History:  Procedure Laterality Date  . abalsion    . ablation    . ANTERIOR CERVICAL DECOMP/DISCECTOMY FUSION N/A 09/19/2015   Procedure: ANTERIOR CERVICAL DECOMPRESSION/DISCECTOMY FUSION 2 LEVELS;  Surgeon: Phylliss Bob, MD;  Location: [facility];  Service: Orthopedics;  Laterality: N/A;  Anterior cervical decompression fusion, cervical 3-4, cervical 4-5 decompression with instrumentation and allograft  . FACIAL RECONSTRUCTION SURGERY     Family History  Problem Relation Age of Onset  . Diabetes Mother   . Diabetes Brother   . Breast cancer Neg Hx     Allergies: Influenza vaccine live; Influenza vaccines; Morphine and related; Prednisone; and Hydrocodone Current Outpatient Prescriptions on File Prior to Visit  Medication Sig Dispense Refill  . ciprofloxacin (CIPRO) 250 MG tablet Take 1 tablet (250 mg total) by mouth 2 (two) times daily. 6 tablet 0  . Continuous Blood Gluc Sensor (FREESTYLE LIBRE SENSOR SYSTEM) MISC 1  applicator by Does not apply route 3 (three) times daily before meals. 1 each 0  . fluconazole (DIFLUCAN) 150 MG tablet Take 1 tablet (150 mg total) by mouth every three (3) days as needed. 2 tablet 0  . gabapentin (NEURONTIN) 100 MG capsule Take 1 capsule (100 mg total) by mouth 2 (two) times daily. 180 capsule 1  . gabapentin (NEURONTIN) 300 MG capsule Take 1 capsule (300 mg total) by mouth 3 (three) times daily. 90 capsule 3  . glucose blood test strip Use as instructed 100 each 12  . hydrochlorothiazide (HYDRODIURIL) 25 MG tablet TAKE 1 TABLET (25 MG TOTAL) BY MOUTH DAILY. 90 tablet 3  . HYDROcodone-homatropine (HYCODAN) 5-1.5 MG/5ML syrup Take 5 mLs by mouth at bedtime as needed for cough. 30 mL 0  . lisinopril (PRINIVIL,ZESTRIL) 10 MG tablet TAKE 1 TABLET BY MOUTH DAILY. 90 tablet 1  . LYRICA 75 MG capsule   1  . meclizine (ANTIVERT) 50 MG tablet Take 1 tablet (50 mg total) by mouth 3 (three) times daily as needed for dizziness. 30 tablet 1  . meloxicam (MOBIC) 15 MG tablet Take 1 tablet (15 mg total) by mouth daily. 90 tablet 1  . metFORMIN (GLUCOPHAGE) 500 MG tablet TAKE 1 TABLET BY MOUTH 2 TIMES DAILY WITH A MEAL. 60 tablet 6  . methocarbamol (ROBAXIN) 750 MG tablet   0  . saxagliptin HCl (ONGLYZA) 2.5 MG TABS tablet TAKE  1 TABLET (2.5 MG TOTAL) BY MOUTH DAILY. 30 tablet 5  . tobramycin (TOBREX) 0.3 % ophthalmic solution Place 2 drops into both eyes every 4 (four) hours. 5 mL 0  . traMADol (ULTRAM) 50 MG tablet   0  . TRUE METRIX BLOOD GLUCOSE TEST test strip CHECK BLOOD SUGAR TWICE A DAY AND AS NEEDED 100 each PRN  . TRUEPLUS LANCETS 30G MISC USE AS DIRECTED 100 each 5   No current facility-administered medications on file prior to visit.     Social History  Substance Use Topics  . Smoking status: Never Smoker  . Smokeless tobacco: Never Used  . Alcohol use Yes     Comment: Occasional wine    Review of Systems  Constitutional: Negative for chills and fever.  Respiratory:  Negative for cough.   Cardiovascular: Negative for chest pain and palpitations.  Gastrointestinal: Negative for nausea and vomiting.      Objective:    BP 120/70   Pulse 73   Temp 98.2 F (36.8 C) (Oral)   Ht 5\' 5"  (1.651 m)   Wt 175 lb (79.4 kg)   SpO2 98%   BMI 29.12 kg/m  BP Readings from Last 3 Encounters:  06/03/17 120/70  03/26/17 100/70  03/23/17 100/74   Wt Readings from Last 3 Encounters:  06/03/17 175 lb (79.4 kg)  12/03/16 175 lb (79.4 kg)  09/10/16 182 lb 3.2 oz (82.6 kg)    Physical Exam  Constitutional: She appears well-developed and well-nourished.  Eyes: Conjunctivae are normal.  Cardiovascular: Normal rate, regular rhythm, normal heart sounds and normal pulses.   Pulmonary/Chest: Effort normal and breath sounds normal. She has no wheezes. She has no rhonchi. She has no rales.  Neurological: She is alert.  Skin: Skin is warm and dry.  Psychiatric: She has a normal mood and affect. Her speech is normal and behavior is normal. Thought content normal.  Vitals reviewed.      Assessment & Plan:   Problem List Items Addressed This Visit      Cardiovascular and Mediastinum   Hypertension    Controlled. Continue current regimen        Endocrine   Diabetes mellitus type 2, controlled ([place])    Controlled. 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 Following with [facility]. We'll follow        Other   Screening for breast cancer    Ordered. Patient will schedule      Relevant Orders   Ambulatory referral to Obstetrics / Gynecology   MM SCREENING BREAST TOMO BILATERAL   Screen for colon cancer - Primary    Ordered.      Relevant Orders   Ambulatory referral to Gastroenterology       I am having Ms. Pollard maintain her meclizine, TRUE METRIX BLOOD GLUCOSE TEST, glucose blood, TRUEPLUS LANCETS 30G, saxagliptin HCl, hydrochlorothiazide,  tobramycin, meloxicam, HYDROcodone-homatropine, gabapentin, gabapentin, ciprofloxacin, FREESTYLE LIBRE SENSOR SYSTEM, methocarbamol, traMADol, fluconazole, LYRICA, lisinopril, and metFORMIN.   No orders of the defined types were placed in this encounter.   Return precautions given.   Risks, benefits, and alternatives of the medications and treatment plan prescribed today were discussed, and patient expressed understanding.   Education regarding symptom management and diagnosis given to patient on AVS.  Continue to follow with Burnard Hawthorne, FNP for routine health maintenance.   Cristopher Peru Barron and  I agreed with plan.   Mable Paris, FNP

## 2017-06-03 NOTE — Assessment & Plan Note (Signed)
Controlled. Continue current regimen. 

## 2017-06-03 NOTE — Patient Instructions (Signed)
Always happy to see you!  Follow up 4 months  We placed a referral for mammogram this year. I asked that you call one the below locations and schedule this when it is convenient for you.   As discussed, I would like you to ask for 3D mammogram over the traditional 2D mammogram as new evidence suggest 3D is superior.   Please note that NOT all insurance companies cover 3D and you may have to pay a higher copay. You may call your insurance company to further clarify your benefits.   Options for [place]  [place], [place]  * [facility] [place], [place] * Note if you ask for 3D mammogram at this location, you must request [place], [place] location*

## 2017-06-03 NOTE — Assessment & Plan Note (Signed)
Ordered.  Patient will schedule. 

## 2017-06-03 NOTE — Progress Notes (Signed)
Pre visit review using our clinic review tool, if applicable. No additional management support is needed unless otherwise documented below in the visit note. 

## 2017-06-07 ENCOUNTER — Encounter: Payer: Self-pay | Admitting: Family

## 2017-06-10 ENCOUNTER — Ambulatory Visit: Payer: Self-pay | Admitting: Family

## 2017-06-22 ENCOUNTER — Other Ambulatory Visit: Payer: Self-pay | Admitting: Family

## 2017-06-25 DIAGNOSIS — M5416 Radiculopathy, lumbar region: Secondary | ICD-10-CM | POA: Diagnosis not present

## 2017-07-12 ENCOUNTER — Ambulatory Visit
Admission: RE | Admit: 2017-07-12 | Discharge: 2017-07-12 | Disposition: A | Discharge status: Home / Self Care | Payer: 59 | Source: Ambulatory Visit | Attending: Family | Admitting: Family

## 2017-07-12 DIAGNOSIS — Z1231 Encounter for screening mammogram for malignant neoplasm of breast: Secondary | ICD-10-CM | POA: Diagnosis not present

## 2017-07-12 DIAGNOSIS — Z1239 Encounter for other screening for malignant neoplasm of breast: Secondary | ICD-10-CM

## 2017-07-14 NOTE — Patient Outreach (Signed)
[facility]) Care Management  07/14/2017  KAYDYNCE PAT 11/11/1965 [id number]   I have removed myself from the Care Team for Link to Wellness.  I will continue to assist the patient through the [facility] app at [facility].   [name], RN, BA, [place], [place] Direct Dial:  [phone]  Fax:  [phone] E-mail: [email or web address] [address] [place], [place]  [address]

## 2017-07-20 ENCOUNTER — Encounter: Payer: Self-pay | Admitting: *Deleted

## 2017-07-20 ENCOUNTER — Ambulatory Visit
Admission: EM | Admit: 2017-07-20 | Discharge: 2017-07-20 | Disposition: A | Discharge status: Home / Self Care | Payer: 59 | Attending: Family Medicine | Admitting: Family Medicine

## 2017-07-20 DIAGNOSIS — M5416 Radiculopathy, lumbar region: Secondary | ICD-10-CM | POA: Diagnosis not present

## 2017-07-20 MED ORDER — NAPROXEN 500 MG PO TABS: 500.0000 mg | ORAL_TABLET | Freq: Two times a day (BID) | ORAL | 0 refills | Status: DC

## 2017-07-20 MED ORDER — KETOROLAC TROMETHAMINE 60 MG/2ML IM SOLN
60.0000 mg | Freq: Once | INTRAMUSCULAR | Status: AC
  Administered 2017-07-20: 60 mg via INTRAMUSCULAR

## 2017-07-20 NOTE — ED Triage Notes (Signed)
Pt hx of chronic left lower back / left hip pain since July. Yesterday pain became much worse and is now interfering with work and sleep. Denies recent injury.

## 2017-07-20 NOTE — ED Provider Notes (Addendum)
[facility]    CSN: [id number] Arrival date & time: 07/20/17  1615     History   Chief Complaint Chief Complaint  Patient presents with  . Back Pain    HPI Alyssa Schultz is a 51 y.o. female.   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  He is under the care of a orthopedic surgeon who is providing her with epidural steroid injections and also EMGs in an attempt to identify the nerve root affected so they can perform surgical decompression.  Today her pain became much worse interfering with her work and sleep.  She is scheduled for another injection in December 13 but because of the pain she is experiencing today she is requesting a Toradol injection if at all possible.       Past Medical History:  Diagnosis Date  . Diabetes mellitus    dx 2014   just take pills  . Hyperlipidemia   . Hypertension     Patient Active Problem List   Diagnosis Date Noted  . Screening for breast cancer 06/03/2017  . Screen for colon cancer 06/03/2017  . Bronchitis 12/03/2016  . Left hip pain 06/04/2016  . Routine general medical examination at a health care facility 03/04/2016  . 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 Allergic rhinitis 10/14/2012  . Hypertension 11/09/2011  . Rash 10/01/2011  . Diabetes mellitus type 2, controlled ([place]) 05/21/2011  . BMI 30.0-30.9,adult 05/21/2011    Past Surgical History:  Procedure Laterality Date  . abalsion    . ablation    . ANTERIOR CERVICAL DECOMP/DISCECTOMY FUSION N/A 09/19/2015   Procedure: ANTERIOR CERVICAL DECOMPRESSION/DISCECTOMY FUSION 2 LEVELS;  Surgeon: Phylliss Bob, MD;  Location: [facility];  Service: Orthopedics;  Laterality: N/A;  Anterior cervical decompression fusion, cervical 3-4, cervical 4-5 decompression with instrumentation and allograft  . BACK SURGERY    . FACIAL RECONSTRUCTION SURGERY       OB History    No data available       Home Medications    Prior to Admission medications   Medication Sig Start Date End Date Taking? Authorizing Provider  glucose blood test strip Use as instructed 09/16/15  Yes Fisher, Linden Dolin, PA-C  hydrochlorothiazide (HYDRODIURIL) 25 MG tablet TAKE 1 TABLET (25 MG TOTAL) BY MOUTH DAILY. 09/14/16  Yes Burnard Hawthorne, FNP  LYRICA 75 MG capsule  04/23/17  Yes [provider]  meloxicam (MOBIC) 15 MG tablet Take 1 tablet (15 mg total) by mouth daily. 11/22/16  Yes Burnard Hawthorne, FNP  metFORMIN (GLUCOPHAGE) 500 MG tablet TAKE 1 TABLET BY MOUTH 2 TIMES DAILY WITH A MEAL. 05/19/17  Yes Burnard Hawthorne, FNP  methocarbamol (ROBAXIN) 750 MG tablet  03/16/17  Yes [provider]  saxagliptin HCl (ONGLYZA) 2.5 MG TABS tablet TAKE 1 TABLET (2.5 MG TOTAL) BY MOUTH DAILY. 06/22/17  Yes Burnard Hawthorne, FNP  TRUE METRIX BLOOD GLUCOSE TEST test strip CHECK BLOOD SUGAR TWICE A DAY AND AS NEEDED 11/07/15  Yes Fisher, Linden Dolin, PA-C  TRUEPLUS LANCETS 30G MISC USE AS DIRECTED 03/06/16  Yes Jackolyn Confer, MD  Continuous Blood Gluc Sensor ([place]) MISC 1 applicator by Does not apply route 3 (three) times daily before meals. 03/02/17   Burnard Hawthorne, FNP  lisinopril (PRINIVIL,ZESTRIL) 10 MG tablet TAKE 1 TABLET BY MOUTH DAILY. 05/19/17   Arnett,  Yvetta Coder, FNP  meclizine (ANTIVERT) 50 MG tablet Take 1 tablet (50 mg total) by mouth 3 (three) times daily as needed for dizziness. 01/06/15   Lorin Picket, PA-C  naproxen (NAPROSYN) 500 MG tablet Take 1 tablet (500 mg total) by mouth 2 (two) times daily with a meal. 07/20/17   Lorin Picket, PA-C    Family History Family History  Problem Relation Age of Onset  . Diabetes Mother   . Diabetes Brother   . Breast cancer Neg Hx     Social History Social History   Tobacco Use  . Smoking status: Never Smoker  . Smokeless tobacco: Never Used  Substance Use  Topics  . Alcohol use: Yes    Comment: Occasional wine  . Drug use: No     Allergies   Influenza vaccine live; Influenza vaccines; Morphine and related; Prednisone; and Hydrocodone   Review of Systems Review of Systems  Constitutional: Positive for activity change. Negative for appetite change, chills, fatigue and fever.  Musculoskeletal: Positive for back pain.  All other systems reviewed and are negative.    Physical Exam Triage Vital Signs ED Triage Vitals  Enc Vitals Group     BP 07/20/17 1634 117/74     Pulse Rate 07/20/17 1634 73     Resp 07/20/17 1634 16     Temp 07/20/17 1634 97.9 F (36.6 C)     Temp Source 07/20/17 1634 Oral     SpO2 07/20/17 1634 98 %     Weight --      Height --      Head Circumference --      Peak Flow --      Pain Score 07/20/17 1636 10     Pain Loc --      Pain Edu? --      Excl. in [place]? --    No data found.  Updated Vital Signs BP 117/74 (BP Location: Left Arm)   Pulse 73   Temp 97.9 F (36.6 C) (Oral)   Resp 16   SpO2 98%   Visual Acuity Right Eye Distance:   Left Eye Distance:   Bilateral Distance:    Right Eye Near:   Left Eye Near:    Bilateral Near:     Physical Exam  Constitutional: She is oriented to person, place, and time. She appears well-developed and well-nourished. No distress.  HENT:  Head: Normocephalic.  Eyes: Pupils are equal, round, and reactive to light.  Neck: Normal range of motion.  Musculoskeletal: She exhibits tenderness.  Patient has decreased range of motion of her lumbar spine.  She favors a slight forward flexion of her back.  Tenderness of the lumbar spine on the left at the sacroiliac joint and lower lumbar paraspinous muscles but there is no palpable spasm present.  Neurological: She is alert and oriented to person, place, and time.  Skin: Skin is warm and dry. She is not diaphoretic.  Psychiatric: She has a normal mood and affect. Her behavior is normal. Judgment and thought content  normal.  Nursing note and vitals reviewed.    UC Treatments / Results  Labs (all labs ordered are listed, but only abnormal results are displayed) Labs Reviewed - No data to display  EKG  EKG Interpretation None       Radiology No results found.  Procedures Procedures (including critical care time)  Medications Ordered in UC Medications  ketorolac (TORADOL) injection 60 mg (60 mg Intramuscular Given 07/20/17 1726)  Patient had good relief of her pain after the injection  Initial Impression / Assessment and Plan / UC Course  I have reviewed the triage vital signs and the nursing notes.  Pertinent labs & imaging results that were available during my care of the patient were reviewed by me and considered in my medical decision making (see chart for details).     Plan: 1. Test/x-ray results and diagnosis reviewed with patient 2. rx as per orders; risks, benefits, potential side effects reviewed with patient 3. Recommend supportive treatment with symptom avoidance.  We will provide her with Naprosyn for improved pain relief but cautioned her she should not take it until after her spinal injection and only if approved by her orthopedic surgeon. 4. F/u prn if symptoms worsen or don't improve   Final Clinical Impressions(s) / UC Diagnoses   Final diagnoses:  Lumbar radiculopathy, chronic    ED Discharge Orders        Ordered    naproxen (NAPROSYN) 500 MG tablet  2 times daily with meals     07/20/17 1732       Controlled Substance Prescriptions [place] Controlled Substance Registry consulted? Not Applicable   Lorin Picket, PA-C 07/20/17 1753    Lorin Picket, PA-C 07/20/17 1754

## 2017-07-28 DIAGNOSIS — M6080 Other myositis, unspecified site: Secondary | ICD-10-CM | POA: Diagnosis not present

## 2017-07-28 DIAGNOSIS — M5442 Lumbago with sciatica, left side: Secondary | ICD-10-CM | POA: Diagnosis not present

## 2017-07-28 DIAGNOSIS — M461 Sacroiliitis, not elsewhere classified: Secondary | ICD-10-CM | POA: Diagnosis not present

## 2017-07-28 DIAGNOSIS — M62830 Muscle spasm of back: Secondary | ICD-10-CM | POA: Diagnosis not present

## 2017-07-28 DIAGNOSIS — M9903 Segmental and somatic dysfunction of lumbar region: Secondary | ICD-10-CM | POA: Diagnosis not present

## 2017-07-28 DIAGNOSIS — M9905 Segmental and somatic dysfunction of pelvic region: Secondary | ICD-10-CM | POA: Diagnosis not present

## 2017-07-28 DIAGNOSIS — M955 Acquired deformity of pelvis: Secondary | ICD-10-CM | POA: Diagnosis not present

## 2017-07-28 DIAGNOSIS — M9902 Segmental and somatic dysfunction of thoracic region: Secondary | ICD-10-CM | POA: Diagnosis not present

## 2017-07-28 DIAGNOSIS — M546 Pain in thoracic spine: Secondary | ICD-10-CM | POA: Diagnosis not present

## 2017-07-29 DIAGNOSIS — M5442 Lumbago with sciatica, left side: Secondary | ICD-10-CM | POA: Diagnosis not present

## 2017-07-29 DIAGNOSIS — M955 Acquired deformity of pelvis: Secondary | ICD-10-CM | POA: Diagnosis not present

## 2017-07-29 DIAGNOSIS — M9905 Segmental and somatic dysfunction of pelvic region: Secondary | ICD-10-CM | POA: Diagnosis not present

## 2017-07-29 DIAGNOSIS — M546 Pain in thoracic spine: Secondary | ICD-10-CM | POA: Diagnosis not present

## 2017-07-29 DIAGNOSIS — M62830 Muscle spasm of back: Secondary | ICD-10-CM | POA: Diagnosis not present

## 2017-07-29 DIAGNOSIS — M461 Sacroiliitis, not elsewhere classified: Secondary | ICD-10-CM | POA: Diagnosis not present

## 2017-07-29 DIAGNOSIS — M9902 Segmental and somatic dysfunction of thoracic region: Secondary | ICD-10-CM | POA: Diagnosis not present

## 2017-07-29 DIAGNOSIS — M9903 Segmental and somatic dysfunction of lumbar region: Secondary | ICD-10-CM | POA: Diagnosis not present

## 2017-07-29 DIAGNOSIS — M6080 Other myositis, unspecified site: Secondary | ICD-10-CM | POA: Diagnosis not present

## 2017-08-09 ENCOUNTER — Telehealth: Payer: Self-pay | Admitting: Gastroenterology

## 2017-08-09 NOTE — Telephone Encounter (Signed)
Patient called and is due to have back surgery. She will call us back when she knows when her surgery is so she an schedule her colonoscopy.

## 2017-08-11 DIAGNOSIS — M6080 Other myositis, unspecified site: Secondary | ICD-10-CM | POA: Diagnosis not present

## 2017-08-11 DIAGNOSIS — M62830 Muscle spasm of back: Secondary | ICD-10-CM | POA: Diagnosis not present

## 2017-08-11 DIAGNOSIS — M461 Sacroiliitis, not elsewhere classified: Secondary | ICD-10-CM | POA: Diagnosis not present

## 2017-08-11 DIAGNOSIS — M9902 Segmental and somatic dysfunction of thoracic region: Secondary | ICD-10-CM | POA: Diagnosis not present

## 2017-08-11 DIAGNOSIS — M9905 Segmental and somatic dysfunction of pelvic region: Secondary | ICD-10-CM | POA: Diagnosis not present

## 2017-08-11 DIAGNOSIS — M955 Acquired deformity of pelvis: Secondary | ICD-10-CM | POA: Diagnosis not present

## 2017-08-11 DIAGNOSIS — M9903 Segmental and somatic dysfunction of lumbar region: Secondary | ICD-10-CM | POA: Diagnosis not present

## 2017-08-11 DIAGNOSIS — M5442 Lumbago with sciatica, left side: Secondary | ICD-10-CM | POA: Diagnosis not present

## 2017-08-11 DIAGNOSIS — M546 Pain in thoracic spine: Secondary | ICD-10-CM | POA: Diagnosis not present

## 2017-08-19 DIAGNOSIS — M5442 Lumbago with sciatica, left side: Secondary | ICD-10-CM | POA: Diagnosis not present

## 2017-08-19 DIAGNOSIS — M955 Acquired deformity of pelvis: Secondary | ICD-10-CM | POA: Diagnosis not present

## 2017-08-19 DIAGNOSIS — M9903 Segmental and somatic dysfunction of lumbar region: Secondary | ICD-10-CM | POA: Diagnosis not present

## 2017-08-19 DIAGNOSIS — M9902 Segmental and somatic dysfunction of thoracic region: Secondary | ICD-10-CM | POA: Diagnosis not present

## 2017-08-19 DIAGNOSIS — M62830 Muscle spasm of back: Secondary | ICD-10-CM | POA: Diagnosis not present

## 2017-08-19 DIAGNOSIS — M9905 Segmental and somatic dysfunction of pelvic region: Secondary | ICD-10-CM | POA: Diagnosis not present

## 2017-08-19 DIAGNOSIS — M6080 Other myositis, unspecified site: Secondary | ICD-10-CM | POA: Diagnosis not present

## 2017-08-19 DIAGNOSIS — M461 Sacroiliitis, not elsewhere classified: Secondary | ICD-10-CM | POA: Diagnosis not present

## 2017-08-19 DIAGNOSIS — M546 Pain in thoracic spine: Secondary | ICD-10-CM | POA: Diagnosis not present

## 2017-08-25 DIAGNOSIS — M5416 Radiculopathy, lumbar region: Secondary | ICD-10-CM | POA: Diagnosis not present

## 2017-08-26 DIAGNOSIS — M546 Pain in thoracic spine: Secondary | ICD-10-CM | POA: Diagnosis not present

## 2017-08-26 DIAGNOSIS — M9905 Segmental and somatic dysfunction of pelvic region: Secondary | ICD-10-CM | POA: Diagnosis not present

## 2017-08-26 DIAGNOSIS — M9902 Segmental and somatic dysfunction of thoracic region: Secondary | ICD-10-CM | POA: Diagnosis not present

## 2017-08-26 DIAGNOSIS — M62830 Muscle spasm of back: Secondary | ICD-10-CM | POA: Diagnosis not present

## 2017-08-26 DIAGNOSIS — M6080 Other myositis, unspecified site: Secondary | ICD-10-CM | POA: Diagnosis not present

## 2017-08-26 DIAGNOSIS — M9903 Segmental and somatic dysfunction of lumbar region: Secondary | ICD-10-CM | POA: Diagnosis not present

## 2017-08-26 DIAGNOSIS — M5442 Lumbago with sciatica, left side: Secondary | ICD-10-CM | POA: Diagnosis not present

## 2017-08-26 DIAGNOSIS — M461 Sacroiliitis, not elsewhere classified: Secondary | ICD-10-CM | POA: Diagnosis not present

## 2017-08-26 DIAGNOSIS — M955 Acquired deformity of pelvis: Secondary | ICD-10-CM | POA: Diagnosis not present

## 2017-09-02 ENCOUNTER — Other Ambulatory Visit: Payer: Self-pay | Admitting: Family

## 2017-09-02 DIAGNOSIS — M9903 Segmental and somatic dysfunction of lumbar region: Secondary | ICD-10-CM | POA: Diagnosis not present

## 2017-09-02 DIAGNOSIS — M461 Sacroiliitis, not elsewhere classified: Secondary | ICD-10-CM | POA: Diagnosis not present

## 2017-09-02 DIAGNOSIS — M6080 Other myositis, unspecified site: Secondary | ICD-10-CM | POA: Diagnosis not present

## 2017-09-02 DIAGNOSIS — M62830 Muscle spasm of back: Secondary | ICD-10-CM | POA: Diagnosis not present

## 2017-09-02 DIAGNOSIS — M9902 Segmental and somatic dysfunction of thoracic region: Secondary | ICD-10-CM | POA: Diagnosis not present

## 2017-09-02 DIAGNOSIS — M5442 Lumbago with sciatica, left side: Secondary | ICD-10-CM | POA: Diagnosis not present

## 2017-09-02 DIAGNOSIS — M955 Acquired deformity of pelvis: Secondary | ICD-10-CM | POA: Diagnosis not present

## 2017-09-02 DIAGNOSIS — M546 Pain in thoracic spine: Secondary | ICD-10-CM | POA: Diagnosis not present

## 2017-09-02 DIAGNOSIS — M9905 Segmental and somatic dysfunction of pelvic region: Secondary | ICD-10-CM | POA: Diagnosis not present

## 2017-09-17 DIAGNOSIS — M5416 Radiculopathy, lumbar region: Secondary | ICD-10-CM | POA: Diagnosis not present

## 2017-10-20 DIAGNOSIS — M5416 Radiculopathy, lumbar region: Secondary | ICD-10-CM | POA: Diagnosis not present

## 2017-10-23 ENCOUNTER — Ambulatory Visit (INDEPENDENT_AMBULATORY_CARE_PROVIDER_SITE_OTHER): Payer: PRIVATE HEALTH INSURANCE

## 2017-10-23 ENCOUNTER — Other Ambulatory Visit: Payer: Self-pay

## 2017-10-23 ENCOUNTER — Ambulatory Visit
Admission: EM | Admit: 2017-10-23 | Discharge: 2017-10-23 | Disposition: A | Discharge status: Home / Self Care | Payer: PRIVATE HEALTH INSURANCE | Attending: Family Medicine | Admitting: Family Medicine

## 2017-10-23 DIAGNOSIS — S8012XA Contusion of left lower leg, initial encounter: Secondary | ICD-10-CM

## 2017-10-23 DIAGNOSIS — W228XXA Striking against or struck by other objects, initial encounter: Secondary | ICD-10-CM

## 2017-10-23 DIAGNOSIS — M79662 Pain in left lower leg: Secondary | ICD-10-CM

## 2017-10-23 DIAGNOSIS — W2209XA Striking against other stationary object, initial encounter: Secondary | ICD-10-CM

## 2017-10-23 NOTE — ED Triage Notes (Signed)
Pt with left leg injury after banging it on the bottom of a desk on Monday. Worker's Comp. Swelling to left shin. Pain 5/10

## 2017-10-23 NOTE — ED Provider Notes (Signed)
[facility]    CSN: [id number] Arrival date & time: 10/23/17  0809     History   Chief Complaint Chief Complaint  Patient presents with  . Leg Injury    HPI Alyssa Schultz is a 52 y.o. female.   HPI  52 year old female presents with an injury to her left anterior leg after striking it on the bottom of a desk on Monday 5 days prior to this visit.  States that she noticed immediate swelling over the shin at its midportion but has since decreased.  However she continues to have unremitting pain in the area.  She has a mild limp according to the patient.  Pain is rated a 5 out of 10.       Past Medical History:  Diagnosis Date  . Diabetes mellitus    dx 2014   just take pills  . Hyperlipidemia   . Hypertension     Patient Active Problem List   Diagnosis Date Noted  . Screening for breast cancer 06/03/2017  . Screen for colon cancer 06/03/2017  . Bronchitis 12/03/2016  . Left hip pain 06/04/2016  . Routine general medical examination at a health care facility 03/04/2016  . 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 Allergic rhinitis 10/14/2012  . Hypertension 11/09/2011  . Rash 10/01/2011  . Diabetes mellitus type 2, controlled ([place]) 05/21/2011  . BMI 30.0-30.9,adult 05/21/2011    Past Surgical History:  Procedure Laterality Date  . abalsion    . ablation    . ANTERIOR CERVICAL DECOMP/DISCECTOMY FUSION N/A 09/19/2015   Procedure: ANTERIOR CERVICAL DECOMPRESSION/DISCECTOMY FUSION 2 LEVELS;  Surgeon: Phylliss Bob, MD;  Location: [facility];  Service: Orthopedics;  Laterality: N/A;  Anterior cervical decompression fusion, cervical 3-4, cervical 4-5 decompression with instrumentation and allograft  . BACK SURGERY    . FACIAL RECONSTRUCTION SURGERY      OB History    No data available       Home Medications    Prior to Admission medications   Medication Sig Start Date End Date Taking? Authorizing Provider  Continuous  Blood Gluc Sensor (FREESTYLE LIBRE SENSOR SYSTEM) MISC 1 applicator by Does not apply route 3 (three) times daily before meals. 03/02/17   Burnard Hawthorne, FNP  glucose blood test strip Use as instructed 09/16/15   Caryn Section Linden Dolin, PA-C  hydrochlorothiazide (HYDRODIURIL) 25 MG tablet TAKE 1 TABLET (25 MG TOTAL) BY MOUTH DAILY. 09/02/17   Burnard Hawthorne, FNP  lisinopril (PRINIVIL,ZESTRIL) 10 MG tablet TAKE 1 TABLET BY MOUTH DAILY. 05/19/17   Burnard Hawthorne, FNP  LYRICA 75 MG capsule  04/23/17   [provider]  meclizine (ANTIVERT) 50 MG tablet Take 1 tablet (50 mg total) by mouth 3 (three) times daily as needed for dizziness. 01/06/15   Lorin Picket, PA-C  meloxicam (MOBIC) 15 MG tablet Take 1 tablet (15 mg total) by mouth daily. 11/22/16   Burnard Hawthorne, FNP  metFORMIN (GLUCOPHAGE) 500 MG tablet TAKE 1 TABLET BY MOUTH 2 TIMES DAILY WITH A MEAL. 05/19/17   Burnard Hawthorne, FNP  methocarbamol (ROBAXIN) 750 MG tablet  03/16/17   [provider]  saxagliptin HCl (ONGLYZA) 2.5 MG TABS tablet TAKE 1 TABLET (2.5 MG TOTAL) BY MOUTH DAILY. 06/22/17   Burnard Hawthorne, FNP  TRUE METRIX BLOOD GLUCOSE TEST test strip CHECK BLOOD SUGAR TWICE A DAY AND AS NEEDED 11/07/15   Caryn Section Linden Dolin, PA-C  TRUEPLUS LANCETS 30G  MISC USE AS DIRECTED 03/06/16   Jackolyn Confer, MD    Family History Family History  Problem Relation Age of Onset  . Diabetes Mother   . Diabetes Brother   . Breast cancer Neg Hx     Social History Social History   Tobacco Use  . Smoking status: Never Smoker  . Smokeless tobacco: Never Used  Substance Use Topics  . Alcohol use: Yes    Comment: Occasional wine  . Drug use: No     Allergies   Influenza vaccine live; Influenza vaccines; Morphine and related; Prednisone; and Hydrocodone   Review of Systems Review of Systems  Constitutional: Positive for activity change. Negative for chills, fatigue and fever.  Musculoskeletal: Positive for gait  problem.  All other systems reviewed and are negative.    Physical Exam Triage Vital Signs ED Triage Vitals  Enc Vitals Group     BP 10/23/17 0845 118/83     Pulse Rate 10/23/17 0845 66     Resp 10/23/17 0845 18     Temp 10/23/17 0845 98.2 F (36.8 C)     Temp Source 10/23/17 0845 Oral     SpO2 10/23/17 0845 99 %     Weight 10/23/17 0846 178 lb (80.7 kg)     Height 10/23/17 0846 5' 5.5" (1.664 m)     Head Circumference --      Peak Flow --      Pain Score 10/23/17 0846 5     Pain Loc --      Pain Edu? --      Excl. in [place]? --    No data found.  Updated Vital Signs BP 118/83 (BP Location: Left Arm)   Pulse 66   Temp 98.2 F (36.8 C) (Oral)   Resp 18   Ht 5' 5.5" (1.664 m)   Wt 178 lb (80.7 kg)   SpO2 99%   BMI 29.17 kg/m   Visual Acuity Right Eye Distance:   Left Eye Distance:   Bilateral Distance:    Right Eye Near:   Left Eye Near:    Bilateral Near:     Physical Exam  Constitutional: She is oriented to person, place, and time. She appears well-developed and well-nourished. No distress.  HENT:  Head: Normocephalic.  Eyes: Pupils are equal, round, and reactive to light.  Neck: Normal range of motion.  Musculoskeletal: Normal range of motion. She exhibits tenderness.  Has a mild antalgic gait on the left.  There is is no eruption of the skin.  She has faint ecchymosis but no erythema.  Area of point tenderness over the anterior tibia at its midportion.  No crepitus is felt .  There is  palpable small hematoma does not at the midportion over the anterior tibial spine..  Good dorsiflexion of the ankle  Neurological: She is alert and oriented to person, place, and time.  Skin: Skin is warm and dry. She is not diaphoretic.  Psychiatric: She has a normal mood and affect. Her behavior is normal. Judgment and thought content normal.  Nursing note and vitals reviewed.    UC Treatments / Results  Labs (all labs ordered are listed, but only abnormal results are  displayed) Labs Reviewed - No data to display  EKG  EKG Interpretation None       Radiology Dg Tibia/fibula Left  Result Date: 10/23/2017 CLINICAL DATA:  Left lower leg pain EXAM: LEFT TIBIA AND FIBULA - 2 VIEW COMPARISON:  None. FINDINGS: There is  no evidence of fracture or other focal bone lesions. Soft tissues are unremarkable. IMPRESSION: Negative. Electronically Signed   By: Rolm Baptise M.D.   On: 10/23/2017 09:12    Procedures Procedures (including critical care time)  Medications Ordered in UC Medications - No data to display   Initial Impression / Assessment and Plan / UC Course  I have reviewed the triage vital signs and the nursing notes.  Pertinent labs & imaging results that were available during my care of the patient were reviewed by me and considered in my medical decision making (see chart for details).     Plan: 1. Test/x-ray results and diagnosis reviewed with patient 2. rx as per orders; risks, benefits, potential side effects reviewed with patient 3. Recommend supportive treatment with ice or heat to the area 20 minutes every 2 hours 4-5 times.  Elevate  as necessary for pain along with use of Motrin. 4. F/u prn if symptoms worsen or don't improve   Final Clinical Impressions(s) / UC Diagnoses   Final diagnoses:  Contusion of left lower leg, initial encounter    ED Discharge Orders    None       Controlled Substance Prescriptions [place] Controlled Substance Registry consulted? Not Applicable   Lorin Picket, PA-C 10/23/17 1724

## 2017-10-23 NOTE — Discharge Instructions (Signed)
Use ice or heat to the area 20 minutes out of every 2 hours 4-5 times daily.  Elevate sufficiently to control swelling.

## 2017-10-25 ENCOUNTER — Encounter: Payer: Self-pay | Admitting: Family

## 2017-11-05 DIAGNOSIS — M5416 Radiculopathy, lumbar region: Secondary | ICD-10-CM | POA: Diagnosis not present

## 2017-11-12 ENCOUNTER — Other Ambulatory Visit: Payer: Self-pay | Admitting: Physician Assistant

## 2017-11-12 ENCOUNTER — Ambulatory Visit
Admission: RE | Admit: 2017-11-12 | Discharge: 2017-11-12 | Disposition: A | Discharge status: Home / Self Care | Payer: PRIVATE HEALTH INSURANCE | Source: Ambulatory Visit | Attending: Physician Assistant | Admitting: Physician Assistant

## 2017-11-12 DIAGNOSIS — M79662 Pain in left lower leg: Secondary | ICD-10-CM

## 2017-11-12 DIAGNOSIS — S8012XA Contusion of left lower leg, initial encounter: Secondary | ICD-10-CM

## 2017-11-16 ENCOUNTER — Ambulatory Visit
Admission: RE | Admit: 2017-11-16 | Discharge: 2017-11-16 | Disposition: A | Discharge status: Home / Self Care | Payer: PRIVATE HEALTH INSURANCE | Source: Ambulatory Visit | Attending: Physician Assistant | Admitting: Physician Assistant

## 2017-11-16 DIAGNOSIS — S8012XA Contusion of left lower leg, initial encounter: Secondary | ICD-10-CM | POA: Insufficient documentation

## 2017-11-16 DIAGNOSIS — X58XXXA Exposure to other specified factors, initial encounter: Secondary | ICD-10-CM | POA: Insufficient documentation

## 2017-11-18 ENCOUNTER — Encounter: Payer: Self-pay | Admitting: Family

## 2017-11-18 DIAGNOSIS — G5601 Carpal tunnel syndrome, right upper limb: Secondary | ICD-10-CM | POA: Diagnosis not present

## 2017-11-24 ENCOUNTER — Ambulatory Visit: Payer: Self-pay | Admitting: Family

## 2017-11-26 ENCOUNTER — Ambulatory Visit: Payer: Self-pay | Admitting: Family

## 2017-12-03 ENCOUNTER — Encounter: Payer: Self-pay | Admitting: Family

## 2017-12-06 ENCOUNTER — Other Ambulatory Visit: Payer: Self-pay | Admitting: Family

## 2017-12-06 DIAGNOSIS — Z0000 Encounter for general adult medical examination without abnormal findings: Secondary | ICD-10-CM

## 2017-12-07 ENCOUNTER — Other Ambulatory Visit
Admission: RE | Admit: 2017-12-07 | Discharge: 2017-12-07 | Disposition: A | Discharge status: Home / Self Care | Payer: 59 | Source: Ambulatory Visit | Attending: Family | Admitting: Family

## 2017-12-07 DIAGNOSIS — Z0000 Encounter for general adult medical examination without abnormal findings: Secondary | ICD-10-CM | POA: Insufficient documentation

## 2017-12-07 LAB — CBC WITH DIFFERENTIAL/PLATELET
Basophils Absolute: 0.1 10*3/uL (ref 0–0.1)
Basophils Relative: 1 %
EOS ABS: 0.2 10*3/uL (ref 0–0.7)
EOS PCT: 3 %
HCT: 42.4 % (ref 35.0–47.0)
Hemoglobin: 14.1 g/dL (ref 12.0–16.0)
LYMPHS ABS: 2.1 10*3/uL (ref 1.0–3.6)
Lymphocytes Relative: 28 %
MCH: 28 pg (ref 26.0–34.0)
MCHC: 33.3 g/dL (ref 32.0–36.0)
MCV: 84.1 fL (ref 80.0–100.0)
Monocytes Absolute: 0.5 10*3/uL (ref 0.2–0.9)
Monocytes Relative: 7 %
Neutro Abs: 4.7 10*3/uL (ref 1.4–6.5)
Neutrophils Relative %: 61 %
PLATELETS: 260 10*3/uL (ref 150–440)
RBC: 5.04 MIL/uL (ref 3.80–5.20)
RDW: 14.1 % (ref 11.5–14.5)
WBC: 7.5 10*3/uL (ref 3.6–11.0)

## 2017-12-07 LAB — COMPREHENSIVE METABOLIC PANEL
ALT: 17 U/L (ref 14–54)
AST: 21 U/L (ref 15–41)
Albumin: 4.4 g/dL (ref 3.5–5.0)
Alkaline Phosphatase: 59 U/L (ref 38–126)
Anion gap: 9 (ref 5–15)
BILIRUBIN TOTAL: 0.6 mg/dL (ref 0.3–1.2)
BUN: 15 mg/dL (ref 6–20)
CHLORIDE: 104 mmol/L (ref 101–111)
CO2: 25 mmol/L (ref 22–32)
CREATININE: 0.7 mg/dL (ref 0.44–1.00)
Calcium: 9.5 mg/dL (ref 8.9–10.3)
GFR calc Af Amer: >60 mL/min (ref >60)
Glucose, Bld: 177 mg/dL — ABNORMAL HIGH (ref 65–99)
Potassium: 3.8 mmol/L (ref 3.5–5.1)
Sodium: 138 mmol/L (ref 135–145)
Total Protein: 8.5 g/dL — ABNORMAL HIGH (ref 6.5–8.1)

## 2017-12-07 LAB — LIPID PANEL
CHOL/HDL RATIO: 3.8 ratio
CHOLESTEROL: 230 mg/dL — AB (ref 0–200)
HDL: 60 mg/dL (ref >40)
LDL Cholesterol: 148 mg/dL — ABNORMAL HIGH (ref 0–99)
TRIGLYCERIDES: 108 mg/dL (ref <150)
VLDL: 22 mg/dL (ref 0–40)

## 2017-12-07 LAB — HEMOGLOBIN A1C
Hgb A1c MFr Bld: 7.8 % — ABNORMAL HIGH (ref 4.8–5.6)
MEAN PLASMA GLUCOSE: 177.16 mg/dL

## 2017-12-07 LAB — TSH: TSH: 2.446 u[IU]/mL (ref 0.350–4.500)

## 2017-12-08 LAB — MICROALBUMIN / CREATININE URINE RATIO
Creatinine, Urine: 95.5 mg/dL
MICROALB UR: 4.8 ug/mL — AB
Microalb Creat Ratio: 5 mg/g creat (ref 0.0–30.0)

## 2017-12-08 LAB — VITAMIN D 25 HYDROXY (VIT D DEFICIENCY, FRACTURES): Vit D, 25-Hydroxy: 24.5 ng/mL — ABNORMAL LOW (ref 30.0–100.0)

## 2017-12-08 NOTE — Progress Notes (Signed)
Subjective:    Patient ID: Alyssa Schultz, female    DOB: 02/01/1966, 52 y.o.   MRN: [id number]  CC: [name] is a 52 y.o. female who presents today for follow up.   HPI: HLD- okay to start crestor  DM- uncontrolled. Notes has gotten steriod injection and nerve block L4 from Dr Laverta Baltimore  and right steroid injection for CTS ( Dr Grandville Silos). States suddenly low back pain and CTS have finally improved.No plan for further injection as worried about her blood sugar. No feet wounds, numbness.  FBG 160, 150. Post prandial 122. Excercising regularly- walking  Declines colonoscopy referral today   HISTORY:  Past Medical History:  Diagnosis Date  . Diabetes mellitus    dx 2014   just take pills  . Hyperlipidemia   . Hypertension    Past Surgical History:  Procedure Laterality Date  . abalsion    . ablation    . ANTERIOR CERVICAL DECOMP/DISCECTOMY FUSION N/A 09/19/2015   Procedure: ANTERIOR CERVICAL DECOMPRESSION/DISCECTOMY FUSION 2 LEVELS;  Surgeon: Phylliss Bob, MD;  Location: [place];  Service: Orthopedics;  Laterality: N/A;  Anterior cervical decompression fusion, cervical 3-4, cervical 4-5 decompression with instrumentation and allograft  . BACK SURGERY    . FACIAL RECONSTRUCTION SURGERY     Family History  Problem Relation Age of Onset  . Diabetes Mother   . Diabetes Brother   . Breast cancer Neg Hx     Allergies: Influenza vaccine live; Influenza vaccines; Morphine and related; Prednisone; and Hydrocodone Current Outpatient Medications on File Prior to Visit  Medication Sig Dispense Refill  . Continuous Blood Gluc Sensor (FREESTYLE LIBRE SENSOR SYSTEM) MISC 1 applicator by Does not apply route 3 (three) times daily before meals. 1 each 0  . glucose blood test strip Use as instructed 100 each 12  . hydrochlorothiazide (HYDRODIURIL) 25 MG tablet TAKE 1 TABLET (25 MG TOTAL) BY MOUTH DAILY. 90 tablet 3  . lisinopril (PRINIVIL,ZESTRIL) 10 MG tablet TAKE 1 TABLET BY MOUTH DAILY. 90  tablet 1  . LYRICA 75 MG capsule   1  . meclizine (ANTIVERT) 50 MG tablet Take 1 tablet (50 mg total) by mouth 3 (three) times daily as needed for dizziness. 30 tablet 1  . meloxicam (MOBIC) 15 MG tablet Take 1 tablet (15 mg total) by mouth daily. 90 tablet 1  . metFORMIN (GLUCOPHAGE) 500 MG tablet TAKE 1 TABLET BY MOUTH 2 TIMES DAILY WITH A MEAL. 60 tablet 6  . methocarbamol (ROBAXIN) 750 MG tablet   0  . saxagliptin HCl (ONGLYZA) 2.5 MG TABS tablet TAKE 1 TABLET (2.5 MG TOTAL) BY MOUTH DAILY. 30 tablet 5  . TRUE METRIX BLOOD GLUCOSE TEST test strip CHECK BLOOD SUGAR TWICE A DAY AND AS NEEDED 100 each PRN  . TRUEPLUS LANCETS 30G MISC USE AS DIRECTED 100 each 5   No current facility-administered medications on file prior to visit.     Social History   Tobacco Use  . Smoking status: Never Smoker  . Smokeless tobacco: Never Used  Substance Use Topics  . Alcohol use: Yes    Comment: Occasional wine  . Drug use: No    Review of Systems  Constitutional: Negative for chills and fever.  Respiratory: Negative for cough.   Cardiovascular: Negative for chest pain and palpitations.  Gastrointestinal: Negative for nausea and vomiting.  Musculoskeletal: Negative for back pain (improved).  Skin: Negative for wound.  Neurological: Negative for numbness.      Objective:  BP 108/68 (BP Location: Left Arm, Patient Position: Sitting, Cuff Size: Normal)   Pulse 83   Temp 98.4 F (36.9 C) (Oral)   Wt 183 lb 8 oz (83.2 kg)   SpO2 96%   BMI 30.07 kg/m  BP Readings from Last 3 Encounters:  12/10/17 108/68  10/23/17 118/83  07/20/17 117/74   Wt Readings from Last 3 Encounters:  12/10/17 183 lb 8 oz (83.2 kg)  10/23/17 178 lb (80.7 kg)  06/03/17 175 lb (79.4 kg)    Physical Exam  Constitutional: She appears well-developed and well-nourished.  Eyes: Conjunctivae are normal.  Cardiovascular: Normal rate, regular rhythm, normal heart sounds and normal pulses.  Pulmonary/Chest: Effort  normal and breath sounds normal. She has no wheezes. She has no rhonchi. She has no rales.  Neurological: She is alert.  Skin: Skin is warm and dry.  Psychiatric: She has a normal mood and affect. Her speech is normal and behavior is normal. Thought content normal.  Vitals reviewed.      Assessment & Plan:   Problem List Items Addressed This Visit      Endocrine   Diabetes mellitus type 2, controlled ([place])    a1c increased; suspect from steriod injections. We agreed to pursue with lifestyle versus increasing metformin. Will repeat a1c in 3 months.       Relevant Medications   rosuvastatin (CRESTOR) 10 MG tablet   Other Relevant Orders   Referral to Nutrition and Diabetes Services     Other   Hyperlipidemia - Primary    LDL not at goal. Will start crestor      Relevant Medications   rosuvastatin (CRESTOR) 10 MG tablet       I am having Cristopher Peru. Mcclenny start on rosuvastatin. I am also having her maintain her meclizine, TRUE METRIX BLOOD GLUCOSE TEST, glucose blood, TRUEPLUS LANCETS 30G, meloxicam, FREESTYLE LIBRE SENSOR SYSTEM, methocarbamol, LYRICA, metFORMIN, saxagliptin HCl, hydrochlorothiazide, and lisinopril.   Meds ordered this encounter  Medications  . rosuvastatin (CRESTOR) 10 MG tablet    Sig: Take 1 tablet (10 mg total) by mouth daily.    Dispense:  90 tablet    Refill:  3    Order Specific Question:   Supervising Provider    Answer:   Crecencio Mc [[id number]]    Return precautions given.   Risks, benefits, and alternatives of the medications and treatment plan prescribed today were discussed, and patient expressed understanding.   Education regarding symptom management and diagnosis given to patient on AVS.  Continue to follow with Burnard Hawthorne, FNP for routine health maintenance.   Cephus Richer and I agreed with plan.   Mable Paris, FNP

## 2017-12-09 ENCOUNTER — Encounter: Payer: Self-pay | Admitting: Family

## 2017-12-10 ENCOUNTER — Ambulatory Visit: Payer: 59 | Admitting: Family

## 2017-12-10 ENCOUNTER — Encounter: Payer: Self-pay | Admitting: Family

## 2017-12-10 VITALS — BP 108/68 | HR 83 | Temp 98.4°F | Wt 183.5 lb

## 2017-12-10 DIAGNOSIS — E118 Type 2 diabetes mellitus with unspecified complications: Secondary | ICD-10-CM | POA: Diagnosis not present

## 2017-12-10 DIAGNOSIS — Z23 Encounter for immunization: Secondary | ICD-10-CM | POA: Diagnosis not present

## 2017-12-10 DIAGNOSIS — E785 Hyperlipidemia, unspecified: Secondary | ICD-10-CM

## 2017-12-10 MED ORDER — ROSUVASTATIN CALCIUM 10 MG PO TABS: 10.0000 mg | ORAL_TABLET | Freq: Every day | ORAL | 3 refills | Status: DC

## 2017-12-10 NOTE — Addendum Note (Signed)
Addended by: Johna Sheriff on: 12/10/2017 05:19 PM   Modules accepted: Orders

## 2017-12-10 NOTE — Patient Instructions (Addendum)
Lets focus on diet.

## 2017-12-10 NOTE — Assessment & Plan Note (Signed)
a1c increased; suspect from steriod injections. We agreed to pursue with lifestyle versus increasing metformin. Will repeat a1c in 3 months.

## 2017-12-10 NOTE — Assessment & Plan Note (Signed)
LDL not at goal. Will start crestor

## 2017-12-17 ENCOUNTER — Ambulatory Visit: Payer: Self-pay | Admitting: Family

## 2017-12-17 ENCOUNTER — Other Ambulatory Visit: Payer: Self-pay | Admitting: Family

## 2017-12-17 ENCOUNTER — Other Ambulatory Visit: Payer: Self-pay

## 2017-12-17 MED ORDER — GLUCOSE BLOOD VI STRP: ORAL_STRIP | 12 refills | Status: DC

## 2017-12-20 DIAGNOSIS — H5213 Myopia, bilateral: Secondary | ICD-10-CM | POA: Diagnosis not present

## 2017-12-23 ENCOUNTER — Encounter: Payer: Self-pay | Admitting: Dietician

## 2017-12-23 ENCOUNTER — Encounter: Payer: 59 | Attending: Family | Admitting: Dietician

## 2017-12-23 VITALS — Ht 65.5 in | Wt 181.4 lb

## 2017-12-23 DIAGNOSIS — Z713 Dietary counseling and surveillance: Secondary | ICD-10-CM | POA: Diagnosis not present

## 2017-12-23 DIAGNOSIS — I10 Essential (primary) hypertension: Secondary | ICD-10-CM | POA: Diagnosis not present

## 2017-12-23 DIAGNOSIS — E7800 Pure hypercholesterolemia, unspecified: Secondary | ICD-10-CM | POA: Insufficient documentation

## 2017-12-23 DIAGNOSIS — E119 Type 2 diabetes mellitus without complications: Secondary | ICD-10-CM | POA: Diagnosis not present

## 2017-12-23 NOTE — Patient Instructions (Signed)
   Try having a bedtime snack with a portion of healthy carbohydrate along with protein, to see if it helps keep blood sugar lower in the morning.   Keep up healthy food choices and regular exercise, great job!

## 2017-12-23 NOTE — Progress Notes (Deleted)
[employer] Employee "self referral" nutrition session: Start time: ***   End time: ***  Met with employee to discuss his/her nutritional concerns and diet history. The employee's questions/concerns were also addressed.  Diet history:  Typical eating pattern: Breakfast: Snack: Lunch: Snack: Supper: Snack: Beverages:  We discussed the following topics:  Healthy Eating  Exercise  Hypertension  Diabetes  Hyperlipidemia  Weight Concerns  Other Medical Conditions:  Educational resources provided:  Planning a Balanced Meal  Plate Planner  General dietary guidelines for high blood pressure  General dietary guidelines for diabetes  General dietary guidelines for heart healthy eating  Sample menus and/or recipes   Additional Comments:    Plan:

## 2017-12-23 NOTE — Progress Notes (Signed)
Medical Nutrition Therapy: Visit start time: 9678 end time: 1615  Assessment:  Diagnosis: Type 2 Diabetes Past medical history: HTN, elevated cholesterol Psychosocial issues/ stress concerns: none Preferred learning method:  . Auditory . Visual   Current weight: 181.4lbs Height: 5'5.5" Medications, supplements: reconciled list in medical record  Progress and evaluation: Patient reports increase in BGs since taking steroidal injections for pinched nerve. BGs do gradually improve; she is currently most concerned with fasting BGs that are often above 130mg /dl, and higher than BGs during the day. She also reports some weight gain since having issues with pinched nerve and unable to engage in regular exercise.  She would like to lose 10-15lbs.    Physical activity: has resumed walking for 30 minutes, 5x a week  Dietary Intake:  Usual eating pattern includes 3 meals and 2-3 snacks per day. Dining out frequency: 6 meals per week.  Breakfast: boiled egg and [place] sausage, sometimes whole wheat toast 1pc,  Snack: apple, grapes (5), or strawberries Lunch: [place] sandwich or salad with 1Tbsp dressing Snack: popcorn, fruit, or pork rinds Supper: grilled meat with vegetables Snack: skinny girl popcorn, or cashews (100 cal) Beverages: water, rarely diet coke  Nutrition Care Education: Topics covered: weight control, diabetes Basic nutrition: basic food groups, appropriate nutrient balance, appropriate meal and snack schedule Weight control: benefits of weight control, determining reasonable weight goal, food portions, plate method meal planning, benefits of high fiber foods, vegetable and fruit intake, whole grains; role of exercise in BG and weight control Advanced nutrition:  food label reading for carbohydrate and fiber Diabetes: appropriate carb intake and balance, basic meal planning, meal and snack options  Nutritional Diagnosis:  [place]-2.2 Altered nutrition-related laboratory As related to  diabetes.  As evidenced by patient with recent HbA1C of 7.8%. [place]-3.3 Overweight/obesity As related to decreased physical activity and excess calories.  As evidenced by patient with BMI of 29.  Intervention: Instruction as noted above.   Patient is making healthy food choices, and is motivated to continue.    She will add protein to evening snack in hopes of improving fasting BGs.   Education Materials given:  . Plate Planner with food lists . Snacking handout . Goals/ instructions   Learner/ who was taught:  . Patient   Level of understanding: Marland Kitchen Verbalizes/ demonstrates competency  Demonstrated degree of understanding via:   Teach back Learning barriers: . None  Willingness to learn/ readiness for change: . Eager, change in progress  Monitoring and Evaluation:  Dietary intake, exercise, BG control, and body weight      follow up: 01/27/18

## 2018-01-07 DIAGNOSIS — Z6830 Body mass index (BMI) 30.0-30.9, adult: Secondary | ICD-10-CM | POA: Diagnosis not present

## 2018-01-07 DIAGNOSIS — Z01419 Encounter for gynecological examination (general) (routine) without abnormal findings: Secondary | ICD-10-CM | POA: Diagnosis not present

## 2018-01-17 ENCOUNTER — Other Ambulatory Visit: Payer: Self-pay | Admitting: Family

## 2018-01-20 ENCOUNTER — Ambulatory Visit
Admission: EM | Admit: 2018-01-20 | Discharge: 2018-01-20 | Disposition: A | Discharge status: Home / Self Care | Payer: 59 | Attending: Family Medicine | Admitting: Family Medicine

## 2018-01-20 ENCOUNTER — Encounter: Payer: Self-pay | Admitting: Emergency Medicine

## 2018-01-20 ENCOUNTER — Other Ambulatory Visit: Payer: Self-pay

## 2018-01-20 ENCOUNTER — Ambulatory Visit (INDEPENDENT_AMBULATORY_CARE_PROVIDER_SITE_OTHER): Payer: 59

## 2018-01-20 DIAGNOSIS — K2900 Acute gastritis without bleeding: Secondary | ICD-10-CM | POA: Diagnosis not present

## 2018-01-20 DIAGNOSIS — R109 Unspecified abdominal pain: Secondary | ICD-10-CM | POA: Diagnosis not present

## 2018-01-20 DIAGNOSIS — R1012 Left upper quadrant pain: Secondary | ICD-10-CM | POA: Diagnosis not present

## 2018-01-20 DIAGNOSIS — R110 Nausea: Secondary | ICD-10-CM | POA: Diagnosis not present

## 2018-01-20 DIAGNOSIS — K2901 Acute gastritis with bleeding: Secondary | ICD-10-CM | POA: Diagnosis not present

## 2018-01-20 LAB — URINALYSIS, COMPLETE (UACMP) WITH MICROSCOPIC
BILIRUBIN URINE: NEGATIVE
Bacteria, UA: NONE SEEN
Glucose, UA: NEGATIVE mg/dL
Ketones, ur: NEGATIVE mg/dL
Leukocytes, UA: NEGATIVE
NITRITE: NEGATIVE
PROTEIN: NEGATIVE mg/dL
SPECIFIC GRAVITY, URINE: 1.01 (ref 1.005–1.030)
pH: 5.5 (ref 5.0–8.0)

## 2018-01-20 LAB — BASIC METABOLIC PANEL
Anion gap: 11 (ref 5–15)
BUN: 11 mg/dL (ref 6–20)
CHLORIDE: 100 mmol/L — AB (ref 101–111)
CO2: 27 mmol/L (ref 22–32)
Calcium: 9.6 mg/dL (ref 8.9–10.3)
Creatinine, Ser: 0.79 mg/dL (ref 0.44–1.00)
GFR calc Af Amer: >60 mL/min (ref >60)
GFR calc non Af Amer: >60 mL/min (ref >60)
GLUCOSE: 153 mg/dL — AB (ref 65–99)
POTASSIUM: 3.6 mmol/L (ref 3.5–5.1)
Sodium: 138 mmol/L (ref 135–145)

## 2018-01-20 LAB — CBC WITH DIFFERENTIAL/PLATELET
Basophils Absolute: 0.1 10*3/uL (ref 0–0.1)
Basophils Relative: 1 %
EOS PCT: 2 %
Eosinophils Absolute: 0.2 10*3/uL (ref 0–0.7)
HCT: 43.1 % (ref 35.0–47.0)
HEMOGLOBIN: 14.3 g/dL (ref 12.0–16.0)
LYMPHS ABS: 2.2 10*3/uL (ref 1.0–3.6)
LYMPHS PCT: 24 %
MCH: 28.1 pg (ref 26.0–34.0)
MCHC: 33.2 g/dL (ref 32.0–36.0)
MCV: 84.5 fL (ref 80.0–100.0)
Monocytes Absolute: 0.6 10*3/uL (ref 0.2–0.9)
Monocytes Relative: 7 %
NEUTROS PCT: 66 %
Neutro Abs: 6.2 10*3/uL (ref 1.4–6.5)
Platelets: 275 10*3/uL (ref 150–440)
RBC: 5.1 MIL/uL (ref 3.80–5.20)
RDW: 14.3 % (ref 11.5–14.5)
WBC: 9.3 10*3/uL (ref 3.6–11.0)

## 2018-01-20 LAB — LIPASE, BLOOD: Lipase: 19 U/L (ref 11–51)

## 2018-01-20 MED ORDER — SUCRALFATE 1 G PO TABS: 1.0000 g | ORAL_TABLET | Freq: Three times a day (TID) | ORAL | 0 refills | Status: DC

## 2018-01-20 MED ORDER — GI COCKTAIL ~~LOC~~
30.0000 mL | Freq: Once | ORAL | Status: AC
  Administered 2018-01-20: 30 mL via ORAL

## 2018-01-20 MED ORDER — OMEPRAZOLE 20 MG PO CPDR: 20.0000 mg | DELAYED_RELEASE_CAPSULE | Freq: Every day | ORAL | 0 refills | Status: DC

## 2018-01-20 NOTE — Discharge Instructions (Addendum)
Take medication as prescribed. Rest. Drink plenty of fluids. Liquid diet for 48 hours, with gradual progression.   As discussed, if you are still having symptoms in 48 hours you will need reevaluation, and likely imaging.   Follow up with your primary care physician this week as needed. Return to Urgent care or Emergency room for increased pain, continued pain, fever, no bowel movement, new or worsening concerns.

## 2018-01-20 NOTE — ED Provider Notes (Signed)
[facility] ____________________________________________  Time seen: Approximately 10:48 AM  I have reviewed the triage vital signs and the nursing notes.   HISTORY  Chief Complaint Abdominal Pain  HPI Alyssa Schultz is a 52 y.o. female presenting for evaluation of abdominal complaints.  Patient reports Tuesday night into Wednesday morning she began having generalized abdominal discomfort with associated nausea and diarrhea.  States this is quick in onset.  Reports 5 or 6 diarrheal episodes throughout the day that then stopped yesterday afternoon.  States no other bowel movement since yesterday afternoon.  Did not take any medication to help with these complaints.  States nausea has also since resolved.  Reports possible subjective low-grade fever, but denies known fever.  Denies abnormal colored stool, blood in stool or dark stool.  No vomiting.  States that this time she only has abdominal discomfort described as a burning discomfort at the upper part of her abdomen.  Was able to tolerate some fluids and small piece of toast from this morning.  States eating did not really change the pain but continues with the burning.  Did take daily medications.  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  No home sick contacts.  Denies dysuria, back pain, chest pain, shortness of breath, palpitations, syncope or near syncope. Denies recent sickness. Denies recent antibiotic use.  No over-the-counter medications taken for the same complaints.  Denies other aggravating or alleviating factors.  Patient states that she does have a history several years ago of having a gastric ulcer in which she was placed on oral medications and was put on a liquid diet for several weeks and symptoms fully resolved without any other issues.  Uterine ablation, denies any other abdominal surgeries.  Burnard Hawthorne, FNP: PCP No LMP recorded. Patient has had an ablation.   Past Medical History:    Diagnosis Date  . Diabetes mellitus    dx 2014   just take pills  . Hyperlipidemia   . Hypertension     Patient Active Problem List   Diagnosis Date Noted  . Hyperlipidemia 12/10/2017  . Screening for breast cancer 06/03/2017  . Screen for colon cancer 06/03/2017  . Bronchitis 12/03/2016  . Left hip pain 06/04/2016  . Routine general medical examination at a health care facility 03/04/2016  . 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 Allergic rhinitis 10/14/2012  . Hypertension 11/09/2011  . Rash 10/01/2011  . Diabetes mellitus type 2, controlled ([place]) 05/21/2011  . BMI 30.0-30.9,adult 05/21/2011    Past Surgical History:  Procedure Laterality Date  . abalsion    . ablation    . ANTERIOR CERVICAL DECOMP/DISCECTOMY FUSION N/A 09/19/2015   Procedure: ANTERIOR CERVICAL DECOMPRESSION/DISCECTOMY FUSION 2 LEVELS;  Surgeon: Phylliss Bob, MD;  Location: [place];  Service: Orthopedics;  Laterality: N/A;  Anterior cervical decompression fusion, cervical 3-4, cervical 4-5 decompression with instrumentation and allograft  . BACK SURGERY    . FACIAL RECONSTRUCTION SURGERY       No current facility-administered medications for this encounter.   Current Outpatient Medications:  .  hydrochlorothiazide (HYDRODIURIL) 25 MG tablet, TAKE 1 TABLET (25 MG TOTAL) BY MOUTH DAILY., Disp: 90 tablet, Rfl: 3 .  lisinopril (PRINIVIL,ZESTRIL) 10 MG tablet, TAKE 1 TABLET BY MOUTH DAILY., Disp: 90 tablet, Rfl: 1 .  metFORMIN (GLUCOPHAGE) 500 MG tablet, TAKE 1 TABLET BY MOUTH 2 TIMES DAILY WITH A MEAL, Disp: 60 tablet, Rfl: 3 .  saxagliptin HCl (ONGLYZA) 2.5 MG TABS tablet,  TAKE 1 TABLET (2.5 MG TOTAL) BY MOUTH DAILY., Disp: 30 tablet, Rfl: 5 .  glucose blood test strip, Use as instructed, Disp: 100 each, Rfl: 12 .  meclizine (ANTIVERT) 50 MG tablet, Take 1 tablet (50 mg total) by mouth 3 (three) times daily as needed for dizziness. (Patient not taking: Reported on  12/23/2017), Disp: 30 tablet, Rfl: 1 .  omeprazole (PRILOSEC) 20 MG capsule, Take 1 capsule (20 mg total) by mouth daily for 7 days., Disp: 7 capsule, Rfl: 0 .  sucralfate (CARAFATE) 1 g tablet, Take 1 tablet (1 g total) by mouth 4 (four) times daily -  with meals and at bedtime for 7 days., Disp: 28 tablet, Rfl: 0 .  TRUE METRIX BLOOD GLUCOSE TEST test strip, CHECK BLOOD SUGAR TWICE A DAY AND AS NEEDED, Disp: 100 each, Rfl: PRN .  TRUEPLUS LANCETS 30G MISC, USE AS DIRECTED, Disp: 100 each, Rfl: 5  Allergies Influenza vaccine live; Influenza vaccines; Morphine and related; Prednisone; and Hydrocodone  Family History  Problem Relation Age of Onset  . Diabetes Mother   . Diabetes Brother   . Breast cancer Neg Hx     Social History Social History   Tobacco Use  . Smoking status: Never Smoker  . Smokeless tobacco: Never Used  Substance Use Topics  . Alcohol use: Yes    Comment: Occasional wine  . Drug use: No    Review of Systems Constitutional: As above.  ENT: No sore throat. Cardiovascular: Denies chest pain. Respiratory: Denies shortness of breath. Gastrointestinal: As above.  Genitourinary: Negative for dysuria. Musculoskeletal: Negative for back pain. Skin: Negative for rash. Neurological: Negative for focal weakness or numbness.   ____________________________________________   PHYSICAL EXAM:  VITAL SIGNS: ED Triage Vitals  Enc Vitals Group     BP 01/20/18 0945 112/86     Pulse Rate 01/20/18 0945 83     Resp 01/20/18 0945 16     Temp 01/20/18 0945 98.1 F (36.7 C)     Temp Source 01/20/18 0945 Oral     SpO2 01/20/18 0945 97 %     Weight 01/20/18 0946 179 lb 12.8 oz (81.6 kg)     Height 01/20/18 0946 5' 5.5" (1.664 m)     Head Circumference --      Peak Flow --      Pain Score 01/20/18 0945 9     Pain Loc --      Pain Edu? --      Excl. in [place]? --     Constitutional: Alert and oriented. Well appearing and in no acute distress. Eyes: Conjunctivae are  normal.  ENT      Head: Normocephalic and atraumatic.      Mouth/Throat: Mucous membranes are moist.Oropharynx non-erythematous. Cardiovascular: Normal rate, regular rhythm. Grossly normal heart sounds.  Good peripheral circulation. Respiratory: Normal respiratory effort without tachypnea nor retractions. Breath sounds are clear and equal bilaterally. No wheezes, rales, rhonchi. Gastrointestinal: Mildly hypoactive bowel sounds.  Mild diffuse epigastric and left upper quadrant abdominal tenderness.  Non-guarding.  Abdomen otherwise soft and nontender.  Nondistended.  No CVA tenderness. Musculoskeletal: No midline cervical, thoracic or lumbar tenderness to palpation.  Neurologic:  Normal speech and language. Speech is normal. No gait instability.  Skin:  Skin is warm, dry and intact. No rash noted. Psychiatric: Mood and affect are normal. Speech and behavior are normal. Patient exhibits appropriate insight and judgment   ___________________________________________   LABS (all labs ordered are listed, but only abnormal  results are displayed)  Labs Reviewed  URINALYSIS, COMPLETE (UACMP) WITH MICROSCOPIC - Abnormal; Notable for the following components:      Result Value   Hgb urine dipstick TRACE (*)    All other components within normal limits  BASIC METABOLIC PANEL - Abnormal; Notable for the following components:   Chloride 100 (*)    Glucose, Bld 153 (*)    All other components within normal limits  CBC WITH DIFFERENTIAL/PLATELET  LIPASE, BLOOD   ____________________________________________  RADIOLOGY  Dg Abd 2 Views  Result Date: 01/20/2018 CLINICAL DATA:  Abdominal discomfort. Left upper abdominal pain, nausea EXAM: ABDOMEN - 2 VIEW COMPARISON:  None. FINDINGS: Moderate stool burden in the right colon and transverse colon. Mild gaseous distention of the distal transverse colon, splenic flexure and proximal descending colon. No small bowel dilatation. No organomegaly or suspicious  calcification. Multiple calcified phleboliths in the pelvis. IMPRESSION: Nonspecific bowel gas pattern with moderate stool burden in the right colon and transverse colon and mild gaseous distention of the colon about the splenic flexure. This could reflect mild focal ileus. No radiographic evidence for obstruction. Electronically Signed   By: Rolm Baptise M.D.   On: 01/20/2018 10:48   ____________________________________________   PROCEDURES Procedures   INITIAL IMPRESSION / ASSESSMENT AND PLAN / ED COURSE  Pertinent labs & imaging results that were available during my care of the patient were reviewed by me and considered in my medical decision making (see chart for details).  Very well-appearing patient.  No acute distress.  Suspect patient with viral gastroenteritis and post viral gastritis.  Patient labs reviewed, discussed with patient but overall unremarkable.  Abdominal x-ray results as above per radiologist with nonspecific bowel gas pattern with moderate stool burden and mild gaseous distention of the colon at splenic flexture, stating that this could reflect a mild focal ileus but no evidence of obstruction.  Patient did receive GI cocktail in urgent care, and reports this fully resolved all abdominal complaints.  States that she is feeling well and not having any pain.  Discussed finding of abdominal x-ray in detail with patient regarding possible ileus as well as, supportive management versus proceeding with CT imaging at this time.  Patient declined CT imaging at this time.  Discussed as no appearance of obstruction at this time, will supportively treat with liquid diet with gradual advancement and 48 hrs and start on omeprazole and sucralfate and have close monitoring and follow-up.  Discussed any symptoms continue after 48 hours needs prompt reevaluation, as well as prompt reevaluation for any worsening complaints.Discussed indication, risks and benefits of medications with patient.  Discussed indication, risks and benefits of medications with patient.  Discussed follow up with Primary care physician this week. Discussed follow up and return parameters including no resolution or any worsening concerns. Patient verbalized understanding and agreed to plan.  Discussed this patient and plan of care with Dr. Lacinda Axon who agrees with plan.  ____________________________________________   FINAL CLINICAL IMPRESSION(S) / ED DIAGNOSES  Final diagnoses:  Abdominal pain, unspecified abdominal location  Acute gastritis, presence of bleeding unspecified, unspecified gastritis type     ED Discharge Orders        Ordered    sucralfate (CARAFATE) 1 g tablet  3 times daily with meals & bedtime     01/20/18 1116    omeprazole (PRILOSEC) 20 MG capsule  Daily     01/20/18 1116       Note: This dictation was prepared with Dragon dictation along  with smaller phrase technology. Any transcriptional errors that result from this process are unintentional.         Marylene Land, NP 01/20/18 1331

## 2018-01-20 NOTE — ED Triage Notes (Signed)
Patient in today c/o abdominal pain x 2 days. Patient has had nausea, but denies emesis. Patient has loose stools Tuesday and Wednesday. Patient describes the pain as severe burning. Patient denies urinary frequency or dysuria.

## 2018-01-21 ENCOUNTER — Encounter: Payer: Self-pay | Admitting: Family

## 2018-01-27 ENCOUNTER — Encounter: Payer: Self-pay | Admitting: Dietician

## 2018-01-27 ENCOUNTER — Encounter: Payer: 59 | Attending: Family | Admitting: Dietician

## 2018-01-27 VITALS — Ht 65.5 in | Wt 179.5 lb

## 2018-01-27 DIAGNOSIS — E119 Type 2 diabetes mellitus without complications: Secondary | ICD-10-CM | POA: Diagnosis not present

## 2018-01-27 DIAGNOSIS — Z713 Dietary counseling and surveillance: Secondary | ICD-10-CM | POA: Diagnosis not present

## 2018-01-27 DIAGNOSIS — E7800 Pure hypercholesterolemia, unspecified: Secondary | ICD-10-CM | POA: Diagnosis not present

## 2018-01-27 DIAGNOSIS — I10 Essential (primary) hypertension: Secondary | ICD-10-CM | POA: Diagnosis not present

## 2018-01-27 NOTE — Progress Notes (Signed)
[employer] Employee "self referral" nutrition session: Start time: 1500   End time: 1530  Height: 5'5.5" Weight: 179.5lbs  Met with employee to discuss his/her nutritional concerns and diet history.   Diet history: Patient reports being ill for the past week with ileus -- she is still undergoing testing to confirm. She has been unable to eat much solid food, mostly liquids and applesauce. She has had some ginger ale and popsicles (not sugar free) when feeling weak, but is concerned that her blood sugar will increase if she takes in much of any sugar.   Typical eating pattern: Breakfast: special K with fairlife milk 1/2c whole  Snack: none or applesauce in the past week Lunch: salad; currently soup or [place], applesauce no sugar added Snack: applesauce Supper: grilled shrimp, vegetables Snack: applesauce Beverages: water, sugar free drinks, some ginger ale   Education topics covered during this visit:  General nutrition/ Healthy eating  Other Medical Conditions: ileus, sick day guidelines  Educational resources provided:  Goals and instructions (AVS)  Additional Comments: Patient will follow-up with MD next week; will complete RD visits next month.   Plan:  Patient will follow low fiber diet, will try some protein shakes to ensure adequate nutrition.

## 2018-01-27 NOTE — Patient Instructions (Signed)
   When feeling ill, eat or drink something light every 2-3 hours, regular not sugar free unless blood sugars are high. Aim for about 15grams of carb each time.  OK to try Glucerna or Boost Glucose control, or other protein drink to supplement other liquid diet and ensure adequate protein.

## 2018-02-13 ENCOUNTER — Other Ambulatory Visit: Payer: Self-pay

## 2018-02-13 ENCOUNTER — Ambulatory Visit
Admission: EM | Admit: 2018-02-13 | Discharge: 2018-02-13 | Disposition: A | Discharge status: Home / Self Care | Payer: 59 | Attending: Family Medicine | Admitting: Family Medicine

## 2018-02-13 DIAGNOSIS — B373 Candidiasis of vulva and vagina: Secondary | ICD-10-CM | POA: Diagnosis not present

## 2018-02-13 DIAGNOSIS — N3000 Acute cystitis without hematuria: Secondary | ICD-10-CM | POA: Diagnosis not present

## 2018-02-13 DIAGNOSIS — B3731 Acute candidiasis of vulva and vagina: Secondary | ICD-10-CM

## 2018-02-13 LAB — URINALYSIS, COMPLETE (UACMP) WITH MICROSCOPIC
BACTERIA UA: NONE SEEN
Bilirubin Urine: NEGATIVE
Glucose, UA: NEGATIVE mg/dL
Hgb urine dipstick: NEGATIVE
Ketones, ur: NEGATIVE mg/dL
LEUKOCYTES UA: NEGATIVE
Nitrite: NEGATIVE
PH: 7 (ref 5.0–8.0)
Protein, ur: NEGATIVE mg/dL
SPECIFIC GRAVITY, URINE: 1.01 (ref 1.005–1.030)
Squamous Epithelial / LPF: NONE SEEN (ref 0–5)
WBC, UA: NONE SEEN WBC/hpf (ref 0–5)

## 2018-02-13 MED ORDER — FLUCONAZOLE 150 MG PO TABS: 150.0000 mg | ORAL_TABLET | Freq: Once | ORAL | 0 refills | Status: AC

## 2018-02-13 MED ORDER — NITROFURANTOIN MONOHYD MACRO 100 MG PO CAPS: 100.0000 mg | ORAL_CAPSULE | Freq: Two times a day (BID) | ORAL | 0 refills | Status: DC

## 2018-02-13 NOTE — Discharge Instructions (Signed)
Plenty of water  Will treat empirically for urinary tract infection and yeast infection.Pending urine culture   Please return if no improvement.

## 2018-02-13 NOTE — ED Triage Notes (Signed)
Patient states that she was seen 3 weeks ago and had some hematuria. Patient states that she thinks she may be getting the start of yeast infection. Patient states that she was wanted to see if the hematuria resolved.

## 2018-02-13 NOTE — ED Provider Notes (Signed)
[facility]    CSN: [id number] Arrival date & time: 02/13/18  1237     History   Chief Complaint Chief Complaint  Patient presents with  . Urinary Frequency    HPI Alyssa Schultz is a 53 y.o. female.   CC: dysuria x 3 days, unchanged.   No hematuria, n , v, back pain   Started thin , white vaginal discharge, itching x 3 days, unchanged  No concern for STDs.   H/o DM         Past Medical History:  Diagnosis Date  . Diabetes mellitus    dx 2014   just take pills  . Hyperlipidemia   . Hypertension     Patient Active Problem List   Diagnosis Date Noted  . Hyperlipidemia 12/10/2017  . Screening for breast cancer 06/03/2017  . Screen for colon cancer 06/03/2017  . Bronchitis 12/03/2016  . Left hip pain 06/04/2016  . Routine general medical examination at a health care facility 03/04/2016  . 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 Allergic rhinitis 10/14/2012  . Hypertension 11/09/2011  . Rash 10/01/2011  . Diabetes mellitus type 2, controlled ([place]) 05/21/2011  . BMI 30.0-30.9,adult 05/21/2011    Past Surgical History:  Procedure Laterality Date  . abalsion    . ablation    . ANTERIOR CERVICAL DECOMP/DISCECTOMY FUSION N/A 09/19/2015   Procedure: ANTERIOR CERVICAL DECOMPRESSION/DISCECTOMY FUSION 2 LEVELS;  Surgeon: Phylliss Bob, MD;  Location: [place];  Service: Orthopedics;  Laterality: N/A;  Anterior cervical decompression fusion, cervical 3-4, cervical 4-5 decompression with instrumentation and allograft  . BACK SURGERY    . FACIAL RECONSTRUCTION SURGERY      OB History   None      Home Medications    Prior to Admission medications   Medication Sig Start Date End Date Taking? Authorizing Provider  glucose blood test strip Use as instructed 12/17/17  Yes Arnett, Yvetta Coder, FNP  hydrochlorothiazide (HYDRODIURIL) 25 MG tablet TAKE 1 TABLET (25 MG TOTAL) BY MOUTH DAILY. 09/02/17  Yes Arnett, Yvetta Coder,  FNP  lisinopril (PRINIVIL,ZESTRIL) 10 MG tablet TAKE 1 TABLET BY MOUTH DAILY. 12/06/17  Yes Burnard Hawthorne, FNP  meclizine (ANTIVERT) 50 MG tablet Take 1 tablet (50 mg total) by mouth 3 (three) times daily as needed for dizziness. 01/06/15  Yes Lorin Picket, PA-C  metFORMIN (GLUCOPHAGE) 500 MG tablet TAKE 1 TABLET BY MOUTH 2 TIMES DAILY WITH A MEAL 01/17/18  Yes Arnett, Yvetta Coder, FNP  omeprazole (PRILOSEC) 20 MG capsule Take 1 capsule (20 mg total) by mouth daily for 7 days. 01/20/18 02/13/18 Yes Marylene Land, NP  saxagliptin HCl (ONGLYZA) 2.5 MG TABS tablet TAKE 1 TABLET (2.5 MG TOTAL) BY MOUTH DAILY. 12/17/17  Yes Burnard Hawthorne, FNP  TRUE METRIX BLOOD GLUCOSE TEST test strip CHECK BLOOD SUGAR TWICE A DAY AND AS NEEDED 11/07/15  Yes Caryn Section Linden Dolin, PA-C  TRUEPLUS LANCETS 30G MISC USE AS DIRECTED 03/06/16  Yes Jackolyn Confer, MD  fluconazole (DIFLUCAN) 150 MG tablet Take 1 tablet (150 mg total) by mouth once for 1 dose. Take one tablet PO once. If sxs persist, may take one tablet PO 3 days later. 02/13/18 02/13/18  Burnard Hawthorne, FNP  nitrofurantoin, macrocrystal-monohydrate, (MACROBID) 100 MG capsule Take 1 capsule (100 mg total) by mouth 2 (two) times daily. Take with food. 02/13/18   Burnard Hawthorne, FNP  sucralfate (CARAFATE) 1 g tablet Take 1 tablet (1  g total) by mouth 4 (four) times daily -  with meals and at bedtime for 7 days. 01/20/18 01/27/18  Marylene Land, NP    Family History Family History  Problem Relation Age of Onset  . Diabetes Mother   . Diabetes Brother   . Breast cancer Neg Hx     Social History Social History   Tobacco Use  . Smoking status: Never Smoker  . Smokeless tobacco: Never Used  Substance Use Topics  . Alcohol use: Yes    Comment: Occasional wine  . Drug use: No     Allergies   Influenza vaccine live; Influenza vaccines; Morphine and related; Prednisone; and Hydrocodone   Review of Systems Review of Systems  Constitutional:  Negative for chills and fever.  Respiratory: Negative for cough.   Cardiovascular: Negative for chest pain and palpitations.  Gastrointestinal: Negative for nausea and vomiting.  Genitourinary: Positive for dysuria. Negative for frequency, hematuria and pelvic pain.     Physical Exam Triage Vital Signs ED Triage Vitals  Enc Vitals Group     BP 02/13/18 1254 110/83     Pulse Rate 02/13/18 1254 82     Resp 02/13/18 1254 16     Temp 02/13/18 1254 98 F (36.7 C)     Temp Source 02/13/18 1254 Oral     SpO2 02/13/18 1254 99 %     Weight 02/13/18 1252 179 lb (81.2 kg)     Height 02/13/18 1252 5' 5.5" (1.664 m)     Head Circumference --      Peak Flow --      Pain Score 02/13/18 1252 6     Pain Loc --      Pain Edu? --      Excl. in [place]? --    No data found.  Updated Vital Signs BP 110/83 (BP Location: Left Arm)   Pulse 82   Temp 98 F (36.7 C) (Oral)   Resp 16   Ht 5' 5.5" (1.664 m)   Wt 179 lb (81.2 kg)   SpO2 99%   BMI 29.33 kg/m   Visual Acuity Right Eye Distance:   Left Eye Distance:   Bilateral Distance:    Right Eye Near:   Left Eye Near:    Bilateral Near:     Physical Exam  Constitutional: She appears well-developed and well-nourished.  Cardiovascular: Normal rate, regular rhythm, normal heart sounds and normal pulses.  Pulmonary/Chest: Effort normal and breath sounds normal. She has no wheezes. She has no rhonchi. She has no rales.  Abdominal: There is no CVA tenderness.  Neurological: She is alert.  Skin: Skin is warm and dry.  Psychiatric: She has a normal mood and affect. Her speech is normal and behavior is normal. Thought content normal.  Vitals reviewed.    UC Treatments / Results  Labs (all labs ordered are listed, but only abnormal results are displayed) Labs Reviewed  URINALYSIS, COMPLETE (UACMP) WITH MICROSCOPIC - Abnormal; Notable for the following components:      Result Value   Color, Urine STRAW (*)    All other components within  normal limits  URINE CULTURE    EKG None  Radiology No results found.  Procedures Procedures (including critical care time)  Medications Ordered in UC Medications - No data to display  Initial Impression / Assessment and Plan / UC Course  I have reviewed the triage vital signs and the nursing notes.  Pertinent labs & imaging results that were available during  my care of the patient were reviewed by me and considered in my medical decision making (see chart for details).     Final Clinical Impressions(s) / UC Diagnoses   Final diagnoses:  Acute cystitis without hematuria  Vaginal yeast infection  Well appearing. Afebrile. Urinalysis negative for blood, pyuria, nitrates.  Based on patient's history of diabetes, and history of urinary tract infections, patient's preference is starting antibiotics today.  Advised her that we are doing this ahead of culture, and if the culture does not grow any bacteria, she will be advised to stop antibiotics.  We will also treat empirically for yeast infection with Diflucan.  Return precautions given.    Discharge Instructions     Plenty of water  Will treat empirically for urinary tract infection and yeast infection.Pending urine culture   Please return if no improvement.     ED Prescriptions    Medication Sig Dispense Auth. Provider   nitrofurantoin, macrocrystal-monohydrate, (MACROBID) 100 MG capsule Take 1 capsule (100 mg total) by mouth 2 (two) times daily. Take with food. 10 capsule Burnard Hawthorne, FNP   fluconazole (DIFLUCAN) 150 MG tablet Take 1 tablet (150 mg total) by mouth once for 1 dose. Take one tablet PO once. If sxs persist, may take one tablet PO 3 days later. 2 tablet Burnard Hawthorne, FNP     Controlled Substance Prescriptions [place] Controlled Substance Registry consulted? Not Applicable   Burnard Hawthorne, [place] 02/13/18 [phone]

## 2018-02-14 ENCOUNTER — Encounter: Payer: Self-pay | Admitting: Family

## 2018-02-14 LAB — URINE CULTURE: Culture: NO GROWTH

## 2018-02-24 ENCOUNTER — Encounter (INDEPENDENT_AMBULATORY_CARE_PROVIDER_SITE_OTHER): Payer: Self-pay | Admitting: Vascular Surgery

## 2018-02-24 ENCOUNTER — Ambulatory Visit (INDEPENDENT_AMBULATORY_CARE_PROVIDER_SITE_OTHER): Payer: PRIVATE HEALTH INSURANCE | Admitting: Vascular Surgery

## 2018-02-24 VITALS — BP 110/79 | HR 73 | Resp 15 | Ht 65.5 in | Wt 177.0 lb

## 2018-02-24 DIAGNOSIS — M79605 Pain in left leg: Secondary | ICD-10-CM

## 2018-02-24 DIAGNOSIS — I10 Essential (primary) hypertension: Secondary | ICD-10-CM | POA: Diagnosis not present

## 2018-02-24 DIAGNOSIS — R600 Localized edema: Secondary | ICD-10-CM

## 2018-02-25 ENCOUNTER — Encounter (INDEPENDENT_AMBULATORY_CARE_PROVIDER_SITE_OTHER): Payer: Self-pay | Admitting: Vascular Surgery

## 2018-02-25 DIAGNOSIS — M79606 Pain in leg, unspecified: Secondary | ICD-10-CM | POA: Insufficient documentation

## 2018-02-25 DIAGNOSIS — R609 Edema, unspecified: Secondary | ICD-10-CM | POA: Insufficient documentation

## 2018-02-25 NOTE — Progress Notes (Signed)
MRN : [id number]  Alyssa Schultz is a 52 y.o. (Apr 08, 1966) female who presents with chief complaint of  Chief Complaint  Patient presents with  . New Patient (Initial Visit)    Varicose Veins, post injury  .  History of Present Illness: Patient is seen for evaluation of leg pain and leg swelling left anterior shin. The patient first noticed the swelling after sustaining an injury at work. The swelling is associated with pain and warmth. The pain and swelling worsens with prolonged dependency and improves with elevation. The patient notes that in the morning the left leg is significantly improved but the symptoms steadily worsen throughout the course of the day.   The patient denies claudication symptoms.  The patient denies symptoms consistent with rest pain.  The patient denies and extensive history of DJD and LS spine disease.  The patient has no had any past angiography, interventions or vascular surgery.  Elevation makes the leg symptoms better, dependency makes them much worse. There is no history of ulcerations. The patient denies any recent changes in medications.  The patient has not been wearing graduated compression.  The patient denies a history of DVT or PE. There is no prior history of phlebitis. There is no history of primary lymphedema.  The patient denies amaurosis fugax or recent TIA symptoms. There are no recent neurological changes noted. The patient denies recent episodes of angina or shortness of breath  Previous duplex ultrasound of the venous system is negative for DVT or STP.  Localized edema of the area was noted.  Current Meds  Medication Sig  . glucose blood test strip Use as instructed  . hydrochlorothiazide (HYDRODIURIL) 25 MG tablet TAKE 1 TABLET (25 MG TOTAL) BY MOUTH DAILY.  Marland Kitchen lisinopril (PRINIVIL,ZESTRIL) 10 MG tablet TAKE 1 TABLET BY MOUTH DAILY.  . meclizine (ANTIVERT) 50 MG tablet Take 1 tablet (50 mg total) by mouth 3 (three) times daily as  needed for dizziness.  . metFORMIN (GLUCOPHAGE) 500 MG tablet TAKE 1 TABLET BY MOUTH 2 TIMES DAILY WITH A MEAL  . saxagliptin HCl (ONGLYZA) 2.5 MG TABS tablet TAKE 1 TABLET (2.5 MG TOTAL) BY MOUTH DAILY.  Marland Kitchen TRUE METRIX BLOOD GLUCOSE TEST test strip CHECK BLOOD SUGAR TWICE A DAY AND AS NEEDED  . TRUEPLUS LANCETS 30G MISC USE AS DIRECTED    Past Medical History:  Diagnosis Date  . Diabetes mellitus    dx 2014   just take pills  . Hyperlipidemia   . Hypertension     Past Surgical History:  Procedure Laterality Date  . abalsion    . ablation    . ANTERIOR CERVICAL DECOMP/DISCECTOMY FUSION N/A 09/19/2015   Procedure: ANTERIOR CERVICAL DECOMPRESSION/DISCECTOMY FUSION 2 LEVELS;  Surgeon: Phylliss Bob, MD;  Location: [place];  Service: Orthopedics;  Laterality: N/A;  Anterior cervical decompression fusion, cervical 3-4, cervical 4-5 decompression with instrumentation and allograft  . BACK SURGERY    . FACIAL RECONSTRUCTION SURGERY      Social History Social History   Tobacco Use  . Smoking status: Never Smoker  . Smokeless tobacco: Never Used  Substance Use Topics  . Alcohol use: Yes    Comment: Occasional wine  . Drug use: No    Family History Family History  Problem Relation Age of Onset  . Diabetes Mother   . Diabetes Brother   . Breast cancer Neg Hx   No family history of bleeding/clotting disorders, porphyria or autoimmune disease   Allergies  Allergen Reactions  .  Influenza Vaccine Live   . Influenza Vaccines   . Morphine And Related Other (See Comments)    Makes her crazy  . Prednisone     "makes me crazy"  . Hydrocodone Rash     REVIEW OF SYSTEMS (Negative unless checked)  Constitutional: [] Weight loss  [] Fever  [] Chills Cardiac: [] Chest pain   [] Chest pressure   [] Palpitations   [] Shortness of breath when laying flat   [] Shortness of breath with exertion. Vascular:  [] Pain in legs with walking   [x] Pain in legs at rest  [] History of DVT   [] Phlebitis    [x] Swelling in legs   [] Varicose veins   [] Non-healing ulcers Pulmonary:   [] Uses home oxygen   [] Productive cough   [] Hemoptysis   [] Wheeze  [] COPD   [] Asthma Neurologic:  [] Dizziness   [] Seizures   [] History of stroke   [] History of TIA  [] Aphasia   [] Vissual changes   [] Weakness or numbness in arm   [] Weakness or numbness in leg Musculoskeletal:   [] Joint swelling   [] Joint pain   [] Low back pain Hematologic:  [] Easy bruising  [] Easy bleeding   [] Hypercoagulable state   [] Anemic Gastrointestinal:  [] Diarrhea   [] Vomiting  [] Gastroesophageal reflux/heartburn   [] Difficulty swallowing. Genitourinary:  [] Chronic kidney disease   [] Difficult urination  [] Frequent urination   [] Blood in urine Skin:  [] Rashes   [] Ulcers  Psychological:  [] History of anxiety   []  History of major depression.  Physical Examination  Vitals:   02/24/18 1506  BP: 110/79  Pulse: 73  Resp: 15  Weight: 177 lb (80.3 kg)  Height: 5' 5.5" (1.664 m)   Body mass index is 29.01 kg/m. Gen: WD/WN, NAD Head: [place]/AT, No temporalis wasting.  Ear/Nose/Throat: Hearing grossly intact, nares w/o erythema or drainage, poor dentition Eyes: PER, EOMI, sclera nonicteric.  Neck: Supple, no masses.  No bruit or JVD.  Pulmonary:  Good air movement, clear to auscultation bilaterally, no use of accessory muscles.  Cardiac: RRR, normal S1, S2, no Murmurs. Vascular: scattered small varicosities present bilaterally.  Mild venous stasis changes to the legs bilaterally.  2+ soft edema in the left anterior shin Vessel Right Left  Radial Palpable Palpable  PT Palpable Palpable  DP Palpable Palpable  Gastrointestinal: soft, non-distended. No guarding/no peritoneal signs.  Musculoskeletal: M/S 5/5 throughout.  No deformity or atrophy.  Neurologic: CN 2-12 intact. Pain and light touch intact in extremities.  Symmetrical.  Speech is fluent. Motor exam as listed above. Psychiatric: Judgment intact, Mood & affect appropriate for pt's clinical  situation. Dermatologic: mild venous rashes no ulcers noted.  No changes consistent with cellulitis. Lymph : No Cervical lymphadenopathy, no lichenification or skin changes of chronic lymphedema.  CBC Lab Results  Component Value Date   WBC 9.3 01/20/2018   HGB 14.3 01/20/2018   HCT 43.1 01/20/2018   MCV 84.5 01/20/2018   PLT 275 01/20/2018    BMET    Component Value Date/Time   NA 138 01/20/2018 1020   NA 142 11/22/2011 1114   K 3.6 01/20/2018 1020   K 4.2 11/22/2011 1114   CL 100 (L) 01/20/2018 1020   CL 104 11/22/2011 1114   CO2 27 01/20/2018 1020   CO2 29 11/22/2011 1114   GLUCOSE 153 (H) 01/20/2018 1020   GLUCOSE 115 (H) 11/22/2011 1114   BUN 11 01/20/2018 1020   BUN 12 11/22/2011 1114   CREATININE 0.79 01/20/2018 1020   CREATININE 0.66 02/26/2012 1538   CALCIUM 9.6 01/20/2018 1020  CALCIUM 9.2 11/22/2011 1114   GFRNONAA >60 01/20/2018 1020   GFRNONAA >60 11/22/2011 1114   GFRAA >60 01/20/2018 1020   GFRAA >60 11/22/2011 1114   CrCl cannot be calculated (Patient's most recent lab result is older than the maximum 21 days allowed.).  COAG Lab Results  Component Value Date   INR 0.95 09/06/2015    Radiology No results found.  Assessment/Plan 1. Pain of left lower extremity No surgery or intervention at this point in time.  I have reviewed my discussion with the patient regarding mild venous insufficiency and post traumatic edema and why it causes symptoms. I have discussed with the patient the chronic skin changes that accompany venous insufficiency and the long term sequela such as possible ulceration. Patient will begin wearing graduated compression stockings on a daily basis, as this will provide excellent control of his edema. The patient will put the stockings on first thing in the morning and removing them in the evening. The patient is reminded not to sleep in the stockings.  In addition, behavioral modification including elevation during the day will  be initiated. Exercise is strongly encouraged.  Given the patient's good control and lack of any problems regarding the venous insufficiency and lymphedema a lymph pump in not need at this time.  The patient will follow up with me PRN should anything change.  The patient voices agreement with this plan.   2. Localized edema See #1  3. Essential hypertension Continue antihypertensive medications as already ordered, these medications have been reviewed and there are no changes at this time.    Hortencia Pilar, MD  02/25/2018 11:02 AM

## 2018-03-10 ENCOUNTER — Encounter: Payer: 59 | Attending: Family | Admitting: Dietician

## 2018-03-10 ENCOUNTER — Encounter: Payer: Self-pay | Admitting: Dietician

## 2018-03-10 VITALS — Ht 65.5 in | Wt 182.2 lb

## 2018-03-10 DIAGNOSIS — E119 Type 2 diabetes mellitus without complications: Secondary | ICD-10-CM | POA: Diagnosis not present

## 2018-03-10 DIAGNOSIS — E7800 Pure hypercholesterolemia, unspecified: Secondary | ICD-10-CM | POA: Diagnosis not present

## 2018-03-10 DIAGNOSIS — Z713 Dietary counseling and surveillance: Secondary | ICD-10-CM | POA: Insufficient documentation

## 2018-03-10 DIAGNOSIS — I10 Essential (primary) hypertension: Secondary | ICD-10-CM | POA: Diagnosis not present

## 2018-03-10 NOTE — Progress Notes (Signed)
[employer] Employee "self referral" nutrition session: Start time: 1500   End time: 1530  Height: 5'5.5" Weight: 182.7lbs  Met with employee to discuss his/her nutritional concerns and diet history.   Diet history: Patient reports increased stress in past 2 weeks; household issues, recurring constipation and abdominal bloating, and has not been able to keep up regular exercise. Eating habits have been somewhat more erratic, with many meals consisting of fruit or vegetables only.   Typical eating pattern: Breakfast: 1-2 boiled eggs and sometimes 2 slices bacon, today 1/2 bagel at work, sometimes fruit Snack: cherries, watermelon, or cucumbers Lunch: BLT, tomato sandwich, today salad with grilled chicken Snack: none or fruit Supper: grilled shrimp, squash, lima beans with corn + fruit Snack: none or fruit Beverages: water, sugar free beverages   Education topics covered during this visit:  General nutrition/ Healthy eating  Exercise  Diabetes  Weight Concerns  Educational resources provided:  Plate Planner  Carbohydrate Counting and Meal Planning [employer])  Goals and Instructions  Additional Comments: Patient feels ready to resume regular exercise.  Discussed options for meals/ snacks when having GI symptoms, including liquid protein sources.    Plan:  Set goals to resume exercise, improve nutritional balance in meals by including protein and controlling carb portions.  Patient will call or email with any questions or concerns.

## 2018-03-10 NOTE — Patient Instructions (Signed)
   Resume some regular exercise for calorie burning and stress management and energy.  Include a protein source with each meal. Goal for protein for each day is 60grams from all sources.   Control portions of fruits and other carb foods, keep to 2-3 servings total per meal or 30-45grams.   Keep up your healthy food choices, great job!

## 2018-03-23 ENCOUNTER — Telehealth: Payer: Self-pay | Admitting: Family

## 2018-03-23 ENCOUNTER — Encounter: Payer: Self-pay | Admitting: Family

## 2018-03-23 NOTE — Telephone Encounter (Signed)
The patient will be transferring her care to another office that is closer.

## 2018-03-23 NOTE — Telephone Encounter (Signed)
Copied from [place] [id number]. Topic: General - Other >> Mar 23, 2018 10:30 AM Cecelia Byars, NT wrote: Reason for CRM: Patient called and would like a return call from [place] at [phone]

## 2018-03-23 NOTE — Telephone Encounter (Signed)
I have printed the letter for your signature

## 2018-03-25 ENCOUNTER — Ambulatory Visit: Payer: Self-pay | Admitting: Family

## 2018-04-12 ENCOUNTER — Other Ambulatory Visit: Payer: Self-pay

## 2018-04-12 ENCOUNTER — Ambulatory Visit
Admission: EM | Admit: 2018-04-12 | Discharge: 2018-04-12 | Disposition: A | Discharge status: Home / Self Care | Payer: 59 | Attending: Family Medicine | Admitting: Family Medicine

## 2018-04-12 ENCOUNTER — Encounter: Payer: Self-pay | Admitting: Emergency Medicine

## 2018-04-12 DIAGNOSIS — R300 Dysuria: Secondary | ICD-10-CM

## 2018-04-12 LAB — URINALYSIS, COMPLETE (UACMP) WITH MICROSCOPIC
Bacteria, UA: NONE SEEN
Bilirubin Urine: NEGATIVE
Glucose, UA: NEGATIVE mg/dL
HGB URINE DIPSTICK: NEGATIVE
Ketones, ur: NEGATIVE mg/dL
Leukocytes, UA: NEGATIVE
Nitrite: NEGATIVE
PROTEIN: NEGATIVE mg/dL
RBC / HPF: NONE SEEN RBC/hpf (ref 0–5)
WBC UA: NONE SEEN WBC/hpf (ref 0–5)
pH: 5 (ref 5.0–8.0)

## 2018-04-12 MED ORDER — SULFAMETHOXAZOLE-TRIMETHOPRIM 800-160 MG PO TABS: 1.0000 | ORAL_TABLET | Freq: Two times a day (BID) | ORAL | 0 refills | Status: AC

## 2018-04-12 NOTE — Discharge Instructions (Signed)
Meds as prescribed while awaiting culture.  Take care  Dr. Lacinda Axon

## 2018-04-12 NOTE — ED Provider Notes (Signed)
[facility]    CSN: [id number] Arrival date & time: 04/12/18  1215  History   Chief Complaint Chief Complaint  Patient presents with  . Dysuria   HPI  52 year old female presents with dysuria.  Patient reports a 2 to 3-day history of dysuria.  No urinary urgency or frequency.  Patient states that this is often how her UTIs present.  No fevers or chills.  No abdominal pain.  No medications or interventions tried.  No known exacerbating /relieving factors.  No other complaints.  PMH, Surgical Hx, Family Hx, Social History reviewed and updated as below.  Past Medical History:  Diagnosis Date  . Diabetes mellitus    dx 2014   just take pills  . Hyperlipidemia   . Hypertension    Patient Active Problem List   Diagnosis Date Noted  . Leg pain 02/25/2018  . Edema 02/25/2018  . Hyperlipidemia 12/10/2017  . Screening for breast cancer 06/03/2017  . Screen for colon cancer 06/03/2017  . Bronchitis 12/03/2016  . Left hip pain 06/04/2016  . Routine general medical examination at a health care facility 03/04/2016  . 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 Allergic rhinitis 10/14/2012  . Hypertension 11/09/2011  . Rash 10/01/2011  . Diabetes mellitus type 2, controlled ([place]) 05/21/2011  . BMI 30.0-30.9,adult 05/21/2011    Past Surgical History:  Procedure Laterality Date  . abalsion    . ablation    . ANTERIOR CERVICAL DECOMP/DISCECTOMY FUSION N/A 09/19/2015   Procedure: ANTERIOR CERVICAL DECOMPRESSION/DISCECTOMY FUSION 2 LEVELS;  Surgeon: Phylliss Bob, MD;  Location: [place];  Service: Orthopedics;  Laterality: N/A;  Anterior cervical decompression fusion, cervical 3-4, cervical 4-5 decompression with instrumentation and allograft  . BACK SURGERY    . FACIAL RECONSTRUCTION SURGERY      OB History   None      Home Medications    Prior to Admission medications   Medication Sig Start Date End Date Taking? Authorizing  Provider  hydrochlorothiazide (HYDRODIURIL) 25 MG tablet TAKE 1 TABLET (25 MG TOTAL) BY MOUTH DAILY. 09/02/17  Yes Arnett, Yvetta Coder, FNP  lisinopril (PRINIVIL,ZESTRIL) 10 MG tablet TAKE 1 TABLET BY MOUTH DAILY. 12/06/17  Yes Arnett, Yvetta Coder, FNP  metFORMIN (GLUCOPHAGE) 500 MG tablet TAKE 1 TABLET BY MOUTH 2 TIMES DAILY WITH A MEAL 01/17/18  Yes Arnett, Yvetta Coder, FNP  saxagliptin HCl (ONGLYZA) 2.5 MG TABS tablet TAKE 1 TABLET (2.5 MG TOTAL) BY MOUTH DAILY. 12/17/17  Yes Burnard Hawthorne, FNP  glucose blood test strip Use as instructed 12/17/17   Burnard Hawthorne, FNP  meclizine (ANTIVERT) 50 MG tablet Take 1 tablet (50 mg total) by mouth 3 (three) times daily as needed for dizziness. 01/06/15   Lorin Picket, PA-C  sucralfate (CARAFATE) 1 g tablet Take 1 tablet (1 g total) by mouth 4 (four) times daily -  with meals and at bedtime for 7 days. 01/20/18 01/27/18  Marylene Land, NP  sulfamethoxazole-trimethoprim (BACTRIM DS,SEPTRA DS) 800-160 MG tablet Take 1 tablet by mouth 2 (two) times daily for 3 days. 04/12/18 04/15/18  Coral Spikes, DO  TRUEPLUS LANCETS 30G MISC USE AS DIRECTED 03/06/16   Jackolyn Confer, MD    Family History Family History  Problem Relation Age of Onset  . Diabetes Mother   . Diabetes Brother   . Breast cancer Neg Hx     Social History Social History   Tobacco Use  . Smoking status: Never Smoker  .  Smokeless tobacco: Never Used  Substance Use Topics  . Alcohol use: Yes    Comment: Occasional wine  . Drug use: No     Allergies   Influenza vaccine live; Influenza vaccines; Morphine and related; Prednisone; and Hydrocodone   Review of Systems Review of Systems  Constitutional: Negative.   Genitourinary: Positive for dysuria.   Physical Exam Triage Vital Signs ED Triage Vitals  Enc Vitals Group     BP 04/12/18 1229 121/82     Pulse Rate 04/12/18 1229 73     Resp 04/12/18 1229 16     Temp 04/12/18 1229 (!) 97.5 F (36.4 C)     Temp Source  04/12/18 1229 Oral     SpO2 04/12/18 1229 100 %     Weight 04/12/18 1230 181 lb (82.1 kg)     Height 04/12/18 1230 5' 5.5" (1.664 m)     Head Circumference --      Peak Flow --      Pain Score 04/12/18 1229 7     Pain Loc --      Pain Edu? --      Excl. in [place]? --    Updated Vital Signs BP 121/82 (BP Location: Left Arm)   Pulse 73   Temp (!) 97.5 F (36.4 C) (Oral)   Resp 16   Ht 5' 5.5" (1.664 m)   Wt 82.1 kg   SpO2 100%   BMI 29.66 kg/m   Visual Acuity Right Eye Distance:   Left Eye Distance:   Bilateral Distance:    Right Eye Near:   Left Eye Near:    Bilateral Near:     Physical Exam  Constitutional: She is oriented to person, place, and time. She appears well-developed. No distress.  Cardiovascular: Normal rate and regular rhythm.  Pulmonary/Chest: Effort normal and breath sounds normal. She has no wheezes. She has no rales.  Abdominal: Soft. She exhibits no distension. There is no tenderness.  Neurological: She is alert and oriented to person, place, and time.  Psychiatric: She has a normal mood and affect. Her behavior is normal.  Nursing note and vitals reviewed.   UC Treatments / Results  Labs (all labs ordered are listed, but only abnormal results are displayed) Labs Reviewed  URINALYSIS, COMPLETE (UACMP) WITH MICROSCOPIC - Abnormal; Notable for the following components:      Result Value   Specific Gravity, Urine <1.005 (*)    All other components within normal limits  URINE CULTURE    EKG None  Radiology No results found.  Procedures Procedures (including critical care time)  Medications Ordered in UC Medications - No data to display  Initial Impression / Assessment and Plan / UC Course  I have reviewed the triage vital signs and the nursing notes.  Pertinent labs & imaging results that were available during my care of the patient were reviewed by me and considered in my medical decision making (see chart for details).    52 year old  female presents with dysuria.  Her urinalysis is negative.  Patient states that she is going out of town.  Patient would like empiric antibiotics while awaiting culture.  Placing on 3-day course of Bactrim.  Final Clinical Impressions(s) / UC Diagnoses   Final diagnoses:  Dysuria     Discharge Instructions     Meds as prescribed while awaiting culture.  Take care  Dr. Lacinda Axon     ED Prescriptions    Medication Sig Dispense Auth. Provider   sulfamethoxazole-trimethoprim (  BACTRIM DS,SEPTRA DS) 800-160 MG tablet Take 1 tablet by mouth 2 (two) times daily for 3 days. 6 tablet Coral Spikes, DO     Controlled Substance Prescriptions [place] Controlled Substance Registry consulted? Not Applicable   Coral Spikes, DO 04/12/18 1321

## 2018-04-12 NOTE — ED Triage Notes (Signed)
Patient in today c/o dysuria x 2-3 days.

## 2018-04-13 ENCOUNTER — Other Ambulatory Visit: Payer: Self-pay

## 2018-04-14 LAB — URINE CULTURE: Culture: <10000 — AB

## 2018-04-15 ENCOUNTER — Ambulatory Visit: Payer: Self-pay | Admitting: Family

## 2018-04-21 ENCOUNTER — Encounter: Payer: Self-pay | Admitting: Family Medicine

## 2018-04-21 ENCOUNTER — Ambulatory Visit (INDEPENDENT_AMBULATORY_CARE_PROVIDER_SITE_OTHER): Payer: 59 | Admitting: Family Medicine

## 2018-04-21 VITALS — BP 118/72 | HR 72 | Ht 65.5 in | Wt 180.0 lb

## 2018-04-21 DIAGNOSIS — E119 Type 2 diabetes mellitus without complications: Secondary | ICD-10-CM

## 2018-04-21 DIAGNOSIS — Z7689 Persons encountering health services in other specified circumstances: Secondary | ICD-10-CM | POA: Diagnosis not present

## 2018-04-21 DIAGNOSIS — N342 Other urethritis: Secondary | ICD-10-CM

## 2018-04-21 MED ORDER — CIPROFLOXACIN HCL 250 MG PO TABS: 250.0000 mg | ORAL_TABLET | Freq: Two times a day (BID) | ORAL | 0 refills | Status: DC

## 2018-04-21 NOTE — Progress Notes (Signed)
Name: Alyssa Schultz   MRN: [id number]    DOB: November 24, 1965   Date:04/21/2018       Progress Note  Subjective  Chief Complaint  Chief Complaint  Patient presents with  . Establish Care    get someone closer    Diabetes  She presents for her follow-up diabetic visit. She has type 2 diabetes mellitus. Her disease course has been stable. There are no hypoglycemic associated symptoms. Pertinent negatives for hypoglycemia include no dizziness, headaches or nervousness/anxiousness. Pertinent negatives for diabetes include no blurred vision, no chest pain, no fatigue, no foot paresthesias, no foot ulcerations, no polydipsia, no polyphagia, no polyuria, no visual change, no weakness and no weight loss. There are no hypoglycemic complications. Symptoms are stable. There are no diabetic complications. There are no known risk factors for coronary artery disease. Current diabetic treatment includes oral agent (dual therapy). Her breakfast blood glucose is taken between 7-8 am.    No problem-specific Assessment & Plan notes found for this encounter.   Past Medical History:  Diagnosis Date  . Diabetes mellitus    dx 2014   just take pills  . Hyperlipidemia   . Hypertension     Past Surgical History:  Procedure Laterality Date  . abalsion    . ablation    . ANTERIOR CERVICAL DECOMP/DISCECTOMY FUSION N/A 09/19/2015   Procedure: ANTERIOR CERVICAL DECOMPRESSION/DISCECTOMY FUSION 2 LEVELS;  Surgeon: Phylliss Bob, MD;  Location: [place];  Service: Orthopedics;  Laterality: N/A;  Anterior cervical decompression fusion, cervical 3-4, cervical 4-5 decompression with instrumentation and allograft  . BACK SURGERY    . FACIAL RECONSTRUCTION SURGERY      Family History  Problem Relation Age of Onset  . Diabetes Mother   . Diabetes Maternal Grandmother   . Hypertension Maternal Grandfather   . Breast cancer Neg Hx     Social History   Socioeconomic History  . Marital status: Single    Spouse name: Not  on file  . Number of children: Not on file  . Years of education: Not on file  . Highest education level: Not on file  Occupational History  . Not on file  Social Needs  . Financial resource strain: Not on file  . Food insecurity:    Worry: Not on file    Inability: Not on file  . Transportation needs:    Medical: Not on file    Non-medical: Not on file  Tobacco Use  . Smoking status: Never Smoker  . Smokeless tobacco: Never Used  Substance and Sexual Activity  . Alcohol use: Yes    Comment: Occasional wine  . Drug use: No  . Sexual activity: Yes  Lifestyle  . Physical activity:    Days per week: Not on file    Minutes per session: Not on file  . Stress: Not on file  Relationships  . Social connections:    Talks on phone: Not on file    Gets together: Not on file    Attends religious service: Not on file    Active member of club or organization: Not on file    Attends meetings of clubs or organizations: Not on file    Relationship status: Not on file  . Intimate partner violence:    Fear of current or ex partner: Not on file    Emotionally abused: Not on file    Physically abused: Not on file    Forced sexual activity: Not on file  Other Topics Concern  .  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 Influenza Vaccine Live   . Influenza Vaccines   . Morphine And Related Other (See Comments)    Makes her crazy  . Prednisone     "makes me crazy"  . Hydrocodone Rash    Outpatient Medications Prior to Visit  Medication Sig Dispense Refill  . ACCU-CHEK FASTCLIX LANCETS MISC Accu-Chek [employer]    . glucose blood (ACCU-CHEK GUIDE) test strip Accu-Chek Guide strips    . hydrochlorothiazide (HYDRODIURIL) 25 MG tablet TAKE 1 TABLET (25 MG TOTAL) BY MOUTH DAILY. 90 tablet 3  . lisinopril  (PRINIVIL,ZESTRIL) 10 MG tablet TAKE 1 TABLET BY MOUTH DAILY. 90 tablet 1  . metFORMIN (GLUCOPHAGE) 500 MG tablet TAKE 1 TABLET BY MOUTH 2 TIMES DAILY WITH A MEAL 60 tablet 3  . saxagliptin HCl (ONGLYZA) 2.5 MG TABS tablet TAKE 1 TABLET (2.5 MG TOTAL) BY MOUTH DAILY. 30 tablet 5  . TRUEPLUS LANCETS 30G MISC USE AS DIRECTED 100 each 5  . meclizine (ANTIVERT) 50 MG tablet Take 1 tablet (50 mg total) by mouth 3 (three) times daily as needed for dizziness. (Patient not taking: Reported on 04/21/2018) 30 tablet 1  . amoxicillin-clavulanate (AUGMENTIN) 875-125 MG tablet amoxicillin 875 mg-potassium clavulanate 125 mg tablet    . ciprofloxacin (CIPRO) 500 MG tablet ciprofloxacin 500 mg tablet    . fluconazole (DIFLUCAN) 100 MG tablet fluconazole 100 mg tablet    . gabapentin (NEURONTIN) 100 MG capsule gabapentin 100 mg capsule    . HYDROcodone-homatropine (HYCODAN) 5-1.5 MG/5ML syrup hydrocodone-homatropine 5 mg-1.5 mg/5 mL oral syrup    . mupirocin ointment (BACTROBAN) 2 % mupirocin 2 % topical ointment    . sucralfate (CARAFATE) 1 g tablet Take 1 tablet (1 g total) by mouth 4 (four) times daily -  with meals and at bedtime for 7 days. 28 tablet 0   No facility-administered medications prior to visit.     Review of Systems  Constitutional: Negative for chills, fatigue, fever, malaise/fatigue and weight loss.  HENT: Negative for ear discharge, ear pain and sore throat.   Eyes: Negative for blurred vision.  Respiratory: Negative for cough, sputum production, shortness of breath and wheezing.   Cardiovascular: Negative for chest pain, palpitations and leg swelling.  Gastrointestinal: Negative for abdominal pain, blood in stool, constipation, diarrhea, heartburn, melena and nausea.  Genitourinary: Negative for dysuria, frequency, hematuria and urgency.  Musculoskeletal: Negative for back pain, joint pain, myalgias and neck pain.  Skin: Negative for rash.  Neurological: Negative for dizziness, tingling,  sensory change, focal weakness, weakness and headaches.  Endo/Heme/Allergies: Negative for environmental allergies, polydipsia and polyphagia. Does not bruise/bleed easily.  Psychiatric/Behavioral: Negative for depression and suicidal ideas. The patient is not nervous/anxious and does not have insomnia.      Objective  Vitals:   04/21/18 1437  BP: 118/72  Pulse: 72  Weight: 180 lb (81.6 kg)  Height: 5' 5.5" (1.664 m)    Physical Exam  Constitutional: She is oriented to person, place, and time. She appears well-developed and well-nourished.  HENT:  Head: Normocephalic.  Right Ear: External ear normal.  Left Ear: External ear normal.  Mouth/Throat: Oropharynx is clear and moist.  Eyes: Pupils are equal, round, and reactive to light.  Conjunctivae and EOM are normal. Lids are everted and swept, no foreign bodies found. Left eye exhibits no hordeolum. No foreign body present in the left eye. Right conjunctiva is not injected. Left conjunctiva is not injected. No scleral icterus.  Neck: Normal range of motion. Neck supple. No JVD present. No tracheal deviation present. No thyromegaly present.  Cardiovascular: Normal rate, regular rhythm, normal heart sounds and intact distal pulses. Exam reveals no gallop and no friction rub.  No murmur heard. Pulmonary/Chest: Effort normal and breath sounds normal. No respiratory distress. She has no wheezes. She has no rales.  Abdominal: Soft. Bowel sounds are normal. She exhibits no mass. There is no hepatosplenomegaly. There is no tenderness. There is no rebound and no guarding.  Musculoskeletal: Normal range of motion. She exhibits no edema or tenderness.  Lymphadenopathy:    She has no cervical adenopathy.  Neurological: She is alert and oriented to person, place, and time. She has normal strength. She displays normal reflexes. No cranial nerve deficit.  Skin: Skin is warm. No rash noted.  Psychiatric: She has a normal mood and affect. Her mood  appears not anxious. She does not exhibit a depressed mood.  Nursing note and vitals reviewed.     Assessment & Plan  Problem List Items Addressed This Visit      Endocrine   Diabetes mellitus type 2, controlled ([place])    Other Visit Diagnoses    Establishing care with new doctor, encounter for    -  Primary   Urethritis       gave cipro for 5 days- if not better, urology      Meds ordered this encounter  Medications  . ciprofloxacin (CIPRO) 250 MG tablet    Sig: Take 1 tablet (250 mg total) by mouth 2 (two) times daily.    Dispense:  10 tablet    Refill:  0      Dr. [facility]  04/21/18

## 2018-05-03 ENCOUNTER — Encounter: Payer: Self-pay | Admitting: Family Medicine

## 2018-05-03 ENCOUNTER — Ambulatory Visit (INDEPENDENT_AMBULATORY_CARE_PROVIDER_SITE_OTHER): Payer: 59 | Admitting: Family Medicine

## 2018-05-03 VITALS — BP 120/70 | HR 68 | Ht 65.5 in | Wt 180.0 lb

## 2018-05-03 DIAGNOSIS — J0100 Acute maxillary sinusitis, unspecified: Secondary | ICD-10-CM | POA: Diagnosis not present

## 2018-05-03 MED ORDER — BENZONATATE 100 MG PO CAPS: 100.0000 mg | ORAL_CAPSULE | Freq: Three times a day (TID) | ORAL | 0 refills | Status: DC | PRN

## 2018-05-03 MED ORDER — AMOXICILLIN-POT CLAVULANATE 875-125 MG PO TABS: 1.0000 | ORAL_TABLET | Freq: Two times a day (BID) | ORAL | 0 refills | Status: DC

## 2018-05-03 MED ORDER — AZELASTINE HCL 0.1 % NA SOLN: 1.0000 | Freq: Two times a day (BID) | NASAL | 12 refills | Status: DC

## 2018-05-03 NOTE — Progress Notes (Signed)
Name: Alyssa Schultz   MRN: [id number]    DOB: July 21, 1966   Date:05/03/2018       Progress Note  Subjective  Chief Complaint  Chief Complaint  Patient presents with  . Sinusitis    sore throat, cong, cough no production, nasal drainage/ clear    Sinusitis  This is a new problem. The current episode started in the past 7 days. The problem has been waxing and waning since onset. There has been no fever. The pain is moderate. Associated symptoms include congestion, coughing, a hoarse voice, sinus pressure and a sore throat. Pertinent negatives include no chills, diaphoresis, ear pain, headaches, neck pain, shortness of breath, sneezing or swollen glands. Past treatments include nothing. The treatment provided moderate relief.    No problem-specific Assessment & Plan notes found for this encounter.   Past Medical History:  Diagnosis Date  . Diabetes mellitus    dx 2014   just take pills  . Hyperlipidemia   . Hypertension     Past Surgical History:  Procedure Laterality Date  . abalsion    . ablation    . ANTERIOR CERVICAL DECOMP/DISCECTOMY FUSION N/A 09/19/2015   Procedure: ANTERIOR CERVICAL DECOMPRESSION/DISCECTOMY FUSION 2 LEVELS;  Surgeon: Phylliss Bob, MD;  Location: [place];  Service: Orthopedics;  Laterality: N/A;  Anterior cervical decompression fusion, cervical 3-4, cervical 4-5 decompression with instrumentation and allograft  . BACK SURGERY    . FACIAL RECONSTRUCTION SURGERY      Family History  Problem Relation Age of Onset  . Diabetes Mother   . Diabetes Maternal Grandmother   . Hypertension Maternal Grandfather   . Breast cancer Neg Hx     Social History   Socioeconomic History  . Marital status: Single    Spouse name: Not on file  . Number of children: Not on file  . Years of education: Not on file  . Highest education level: Not on file  Occupational History  . Not on file  Social Needs  . Financial resource strain: Not on file  . Food insecurity:   Worry: Not on file    Inability: Not on file  . Transportation needs:    Medical: Not on file    Non-medical: Not on file  Tobacco Use  . Smoking status: Never Smoker  . Smokeless tobacco: Never Used  Substance and Sexual Activity  . Alcohol use: Yes    Comment: Occasional wine  . Drug use: No  . Sexual activity: Yes  Lifestyle  . Physical activity:    Days per week: Not on file    Minutes per session: Not on file  . Stress: Not on file  Relationships  . Social connections:    Talks on phone: Not on file    Gets together: Not on file    Attends religious service: Not on file    Active member of club or organization: Not on file    Attends meetings of clubs or organizations: Not on file    Relationship status: Not on file  . Intimate partner violence:    Fear of current or ex partner: Not on file    Emotionally abused: Not on file    Physically abused: Not on file    Forced sexual activity: Not on file  Other Topics Concern  . 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 Influenza Vaccine Live   . Influenza Vaccines   . Morphine And Related Other (See Comments)    Makes her crazy  . Prednisone     "makes me crazy"  . Hydrocodone Rash    Outpatient Medications Prior to Visit  Medication Sig Dispense Refill  . ACCU-CHEK FASTCLIX LANCETS MISC Accu-Chek [employer]    . glucose blood (ACCU-CHEK GUIDE) test strip Accu-Chek Guide strips    . hydrochlorothiazide (HYDRODIURIL) 25 MG tablet TAKE 1 TABLET (25 MG TOTAL) BY MOUTH DAILY. 90 tablet 3  . lisinopril (PRINIVIL,ZESTRIL) 10 MG tablet TAKE 1 TABLET BY MOUTH DAILY. 90 tablet 1  . metFORMIN (GLUCOPHAGE) 500 MG tablet TAKE 1 TABLET BY MOUTH 2 TIMES DAILY WITH A MEAL 60 tablet 3  . saxagliptin HCl (ONGLYZA) 2.5 MG TABS tablet TAKE 1 TABLET (2.5 MG TOTAL)  BY MOUTH DAILY. 30 tablet 5  . TRUEPLUS LANCETS 30G MISC USE AS DIRECTED 100 each 5  . meclizine (ANTIVERT) 50 MG tablet Take 1 tablet (50 mg total) by mouth 3 (three) times daily as needed for dizziness. (Patient not taking: Reported on 04/21/2018) 30 tablet 1  . ciprofloxacin (CIPRO) 250 MG tablet Take 1 tablet (250 mg total) by mouth 2 (two) times daily. 10 tablet 0   No facility-administered medications prior to visit.     Review of Systems  Constitutional: Negative for chills, diaphoresis, fever, malaise/fatigue and weight loss.  HENT: Positive for congestion, hoarse voice, sinus pressure and sore throat. Negative for ear discharge, ear pain and sneezing.   Eyes: Negative for blurred vision.  Respiratory: Positive for cough. Negative for sputum production, shortness of breath and wheezing.   Cardiovascular: Negative for chest pain, palpitations and leg swelling.  Gastrointestinal: Negative for abdominal pain, blood in stool, constipation, diarrhea, heartburn, melena and nausea.  Genitourinary: Negative for dysuria, frequency, hematuria and urgency.  Musculoskeletal: Negative for back pain, joint pain, myalgias and neck pain.  Skin: Negative for rash.  Neurological: Negative for dizziness, tingling, sensory change, focal weakness and headaches.  Endo/Heme/Allergies: Negative for environmental allergies and polydipsia. Does not bruise/bleed easily.  Psychiatric/Behavioral: Negative for depression and suicidal ideas. The patient is not nervous/anxious and does not have insomnia.      Objective  Vitals:   05/03/18 1441  BP: 120/70  Pulse: 68  Weight: 180 lb (81.6 kg)  Height: 5' 5.5" (1.664 m)    Physical Exam  Constitutional: She is oriented to person, place, and time. She appears well-developed and well-nourished.  HENT:  Head: Normocephalic.  Right Ear: Hearing, external ear and ear canal normal. Tympanic membrane is retracted.  Left Ear: Hearing, external ear and ear canal  normal. Tympanic membrane is retracted.  Nose: Mucosal edema and rhinorrhea present. Right sinus exhibits no maxillary sinus tenderness and no frontal sinus tenderness. Left sinus exhibits maxillary sinus tenderness. Left sinus exhibits no frontal sinus tenderness.  Mouth/Throat: Uvula is midline. Posterior oropharyngeal erythema present. No oropharyngeal exudate or posterior oropharyngeal edema.  Eyes: Pupils are equal, round, and reactive to light. Conjunctivae and EOM are normal. Lids are everted and swept, no foreign bodies found. Left eye exhibits no hordeolum. No foreign body present in the left eye. Right conjunctiva is not injected. Left conjunctiva is not injected. No scleral icterus.  Neck: Normal range of motion. Neck supple. No JVD present. No tracheal deviation present. No thyromegaly present.  Cardiovascular: Normal rate, regular rhythm, normal heart sounds and intact distal pulses. Exam  reveals no gallop and no friction rub.  No murmur heard. Pulmonary/Chest: Effort normal and breath sounds normal. No respiratory distress. She has no wheezes. She has no rales.  Abdominal: Soft. Bowel sounds are normal. She exhibits no mass. There is no hepatosplenomegaly. There is no tenderness. There is no rebound and no guarding.  Musculoskeletal: Normal range of motion. She exhibits no edema or tenderness.  Lymphadenopathy:       Head (right side): Submandibular adenopathy present.       Head (left side): Submandibular adenopathy present.    She has no cervical adenopathy.       Right cervical: No superficial cervical adenopathy present.      Left cervical: No superficial cervical adenopathy present.  tender  Neurological: She is alert and oriented to person, place, and time. She has normal strength. She displays normal reflexes. No cranial nerve deficit.  Skin: Skin is warm. No rash noted.  Psychiatric: She has a normal mood and affect. Her mood appears not anxious. She does not exhibit a  depressed mood.  Nursing note and vitals reviewed.     Assessment & Plan  Problem List Items Addressed This Visit    None    Visit Diagnoses    Acute maxillary sinusitis, recurrence not specified    -  Primary   Acute Prescribe augmentin 875 bid for 10 days. astelin nasal and tessalon perles for sx relief.   Relevant Medications   amoxicillin-clavulanate (AUGMENTIN) 875-125 MG tablet   azelastine (ASTELIN) 0.1 % nasal spray   benzonatate (TESSALON) 100 MG capsule      Meds ordered this encounter  Medications  . amoxicillin-clavulanate (AUGMENTIN) 875-125 MG tablet    Sig: Take 1 tablet by mouth 2 (two) times daily.    Dispense:  20 tablet    Refill:  0  . azelastine (ASTELIN) 0.1 % nasal spray    Sig: Place 1 spray into both nostrils 2 (two) times daily. Use in each nostril as directed    Dispense:  30 mL    Refill:  12  . benzonatate (TESSALON) 100 MG capsule    Sig: Take 1 capsule (100 mg total) by mouth 3 (three) times daily as needed for cough.    Dispense:  20 capsule    Refill:  0      Dr. Otilio Miu [facility]  05/03/18

## 2018-06-17 ENCOUNTER — Other Ambulatory Visit
Admission: RE | Admit: 2018-06-17 | Discharge: 2018-06-17 | Disposition: A | Discharge status: Home / Self Care | Payer: 59 | Source: Ambulatory Visit | Attending: Family Medicine | Admitting: Family Medicine

## 2018-06-17 DIAGNOSIS — E119 Type 2 diabetes mellitus without complications: Secondary | ICD-10-CM | POA: Insufficient documentation

## 2018-06-17 LAB — HEMOGLOBIN A1C
Hgb A1c MFr Bld: 7.4 % — ABNORMAL HIGH (ref 4.8–5.6)
Mean Plasma Glucose: 165.68 mg/dL

## 2018-06-20 ENCOUNTER — Other Ambulatory Visit: Payer: Self-pay | Admitting: Family

## 2018-06-20 ENCOUNTER — Other Ambulatory Visit: Payer: Self-pay

## 2018-06-20 DIAGNOSIS — E119 Type 2 diabetes mellitus without complications: Secondary | ICD-10-CM

## 2018-06-22 ENCOUNTER — Other Ambulatory Visit: Payer: Self-pay | Admitting: Family Medicine

## 2018-06-22 DIAGNOSIS — Z1231 Encounter for screening mammogram for malignant neoplasm of breast: Secondary | ICD-10-CM

## 2018-06-23 ENCOUNTER — Ambulatory Visit (INDEPENDENT_AMBULATORY_CARE_PROVIDER_SITE_OTHER): Payer: 59 | Admitting: Family Medicine

## 2018-06-23 ENCOUNTER — Encounter: Payer: Self-pay | Admitting: Family Medicine

## 2018-06-23 VITALS — BP 100/70 | HR 72 | Ht 65.5 in | Wt 177.0 lb

## 2018-06-23 DIAGNOSIS — E119 Type 2 diabetes mellitus without complications: Secondary | ICD-10-CM

## 2018-06-23 MED ORDER — SAXAGLIPTIN HCL 5 MG PO TABS: 5.0000 mg | ORAL_TABLET | Freq: Every day | ORAL | 5 refills | Status: DC

## 2018-06-23 NOTE — Progress Notes (Signed)
Date:  06/23/2018   Name:  Alyssa Schultz   DOB:  03-06-1966   MRN:  [id number]   Chief Complaint: Diabetes (? try increasing Onglyza) Diabetes  She presents for her follow-up diabetic visit. She has type 2 diabetes mellitus. Her disease course has been fluctuating. There are no hypoglycemic associated symptoms. Pertinent negatives for hypoglycemia include no dizziness, headaches or nervousness/anxiousness. There are no diabetic associated symptoms. Pertinent negatives for diabetes include no fatigue, no polydipsia, no polyphagia, no polyuria and no weakness. There are no hypoglycemic complications. Symptoms are stable. There are no diabetic complications. Risk factors for coronary artery disease include diabetes mellitus. Current diabetic treatment includes oral agent (dual therapy). She is compliant with treatment some of the time. Her weight is stable. She is following a generally healthy diet. Meal planning includes avoidance of concentrated sweets and carbohydrate counting. She participates in exercise three times a week. Her home blood glucose trend is fluctuating minimally. Her breakfast blood glucose is taken between 8-9 am. Her breakfast blood glucose range is generally 140-180 mg/dl. An ACE inhibitor/angiotensin II receptor blocker is being taken. She does not see a podiatrist.Eye exam is current.     Review of Systems  Constitutional: Negative.  Negative for chills, fatigue, fever and unexpected weight change.  HENT: Negative for congestion, ear discharge, ear pain, rhinorrhea, sinus pressure, sneezing and sore throat.   Eyes: Negative for photophobia, pain, discharge, redness and itching.  Respiratory: Negative for cough, shortness of breath, wheezing and stridor.   Gastrointestinal: Negative for abdominal pain, blood in stool, constipation, diarrhea, nausea and vomiting.  Endocrine: Negative for cold intolerance, heat intolerance, polydipsia, polyphagia and polyuria.  Genitourinary:  Negative for dysuria, flank pain, frequency, hematuria, menstrual problem, pelvic pain, urgency, vaginal bleeding and vaginal discharge.  Musculoskeletal: Negative for arthralgias, back pain and myalgias.  Skin: Negative for rash.  Allergic/Immunologic: Negative for environmental allergies and food allergies.  Neurological: Negative for dizziness, weakness, light-headedness, numbness and headaches.  Hematological: Negative for adenopathy. Does not bruise/bleed easily.  Psychiatric/Behavioral: Negative for dysphoric mood. The patient is not nervous/anxious.     Patient Active Problem List   Diagnosis Date Noted  . Leg pain 02/25/2018  . Edema 02/25/2018  . Hyperlipidemia 12/10/2017  . Screening for breast cancer 06/03/2017  . Screen for colon cancer 06/03/2017  . Bronchitis 12/03/2016  . Left hip pain 06/04/2016  . Routine general medical examination at a health care facility 03/04/2016  . 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 Allergic rhinitis 10/14/2012  . Hypertension 11/09/2011  . Rash 10/01/2011  . Diabetes mellitus type 2, controlled ([place]) 05/21/2011  . BMI 30.0-30.9,adult 05/21/2011    Allergies  Allergen Reactions  . Influenza Vaccine Live   . Influenza Vaccines   . Morphine And Related Other (See Comments)    Makes her crazy  . Prednisone     "makes me crazy"  . Hydrocodone Rash    Past Surgical History:  Procedure Laterality Date  . abalsion    . ablation    . ANTERIOR CERVICAL DECOMP/DISCECTOMY FUSION N/A 09/19/2015   Procedure: ANTERIOR CERVICAL DECOMPRESSION/DISCECTOMY FUSION 2 LEVELS;  Surgeon: Phylliss Bob, MD;  Location: [facility];  Service: Orthopedics;  Laterality: N/A;  Anterior cervical decompression fusion, cervical 3-4, cervical 4-5 decompression with instrumentation and allograft  . BACK SURGERY    . FACIAL RECONSTRUCTION SURGERY      Social History   Tobacco Use  . Smoking status: Never Smoker  .  Smokeless  tobacco: Never Used  Substance Use Topics  . Alcohol use: Yes    Comment: Occasional wine  . Drug use: No     Medication list has been reviewed and updated.  Current Meds  Medication Sig  . ACCU-CHEK FASTCLIX LANCETS MISC Accu-Chek [employer]  . glucose blood (ACCU-CHEK GUIDE) test strip Accu-Chek Guide strips  . hydrochlorothiazide (HYDRODIURIL) 25 MG tablet TAKE 1 TABLET (25 MG TOTAL) BY MOUTH DAILY.  Marland Kitchen lisinopril (PRINIVIL,ZESTRIL) 10 MG tablet TAKE 1 TABLET BY MOUTH DAILY.  . metFORMIN (GLUCOPHAGE) 500 MG tablet TAKE 1 TABLET BY MOUTH 2 TIMES DAILY WITH A MEAL  . saxagliptin HCl (ONGLYZA) 2.5 MG TABS tablet TAKE 1 TABLET BY MOUTH DAILY.  Marland Kitchen TRUEPLUS LANCETS 30G MISC USE AS DIRECTED    PHQ 2/9 Scores 04/21/2018 12/23/2017 06/03/2017 12/03/2016  PHQ - 2 Score 0 0 0 0  PHQ- 9 Score 0 - - -    Physical Exam  Constitutional: She is oriented to person, place, and time. She appears well-developed and well-nourished.  HENT:  Head: Normocephalic.  Right Ear: External ear normal.  Left Ear: External ear normal.  Mouth/Throat: Oropharynx is clear and moist.  Eyes: Pupils are equal, round, and reactive to light. Conjunctivae and EOM are normal. Lids are everted and swept, no foreign bodies found. Left eye exhibits no hordeolum. No foreign body present in the left eye. Right conjunctiva is not injected. Left conjunctiva is not injected. No scleral icterus.  Neck: Normal range of motion. Neck supple. No JVD present. No tracheal deviation present. No thyromegaly present.  Cardiovascular: Normal rate, regular rhythm, normal heart sounds and intact distal pulses. Exam reveals no gallop and no friction rub.  No murmur heard. Pulmonary/Chest: Effort normal and breath sounds normal. No respiratory distress. She has no wheezes. She has no rales.  Abdominal: Soft. Bowel sounds are normal. She exhibits no mass. There is no hepatosplenomegaly. There is no tenderness. There is no rebound and no  guarding.  Musculoskeletal: Normal range of motion. She exhibits no edema or tenderness.  Lymphadenopathy:    She has no cervical adenopathy.  Neurological: She is alert and oriented to person, place, and time. She has normal strength. She displays normal reflexes. No cranial nerve deficit.  Skin: Skin is warm. No rash noted.  wart  Psychiatric: She has a normal mood and affect. Her mood appears not anxious. She does not exhibit a depressed mood.  Nursing note and vitals reviewed.   BP 100/70   Pulse 72   Ht 5' 5.5" (1.664 m)   Wt 177 lb (80.3 kg)   BMI 29.01 kg/m   Assessment and Plan:  1. Controlled type 2 diabetes mellitus without complication, without long-term current use of insulin (HCC) Chronic.  Uncontrolled.  Last A1c 7.4.  We will increase saxagliptin to 5 mg a day.  Return to clinic in 6 weeks for recheck and A1c. - saxagliptin HCl (ONGLYZA) 5 MG TABS tablet; Take 1 tablet (5 mg total) by mouth daily.  Dispense: 30 tablet; Refill: 5   Dr. Macon [facility]  06/23/2018

## 2018-06-27 ENCOUNTER — Other Ambulatory Visit: Payer: Self-pay | Admitting: Family

## 2018-07-21 ENCOUNTER — Ambulatory Visit
Admission: RE | Admit: 2018-07-21 | Discharge: 2018-07-21 | Disposition: A | Discharge status: Home / Self Care | Payer: 59 | Source: Ambulatory Visit | Attending: Family Medicine | Admitting: Family Medicine

## 2018-07-21 ENCOUNTER — Ambulatory Visit: Payer: Self-pay

## 2018-07-21 DIAGNOSIS — Z1231 Encounter for screening mammogram for malignant neoplasm of breast: Secondary | ICD-10-CM | POA: Insufficient documentation

## 2018-07-25 ENCOUNTER — Other Ambulatory Visit: Payer: Self-pay | Admitting: Family

## 2018-08-04 ENCOUNTER — Ambulatory Visit: Payer: 59 | Admitting: Family Medicine

## 2018-08-11 ENCOUNTER — Ambulatory Visit: Payer: 59 | Admitting: Family Medicine

## 2018-08-19 DIAGNOSIS — E785 Hyperlipidemia, unspecified: Secondary | ICD-10-CM | POA: Diagnosis not present

## 2018-08-19 DIAGNOSIS — E119 Type 2 diabetes mellitus without complications: Secondary | ICD-10-CM | POA: Diagnosis not present

## 2018-08-19 DIAGNOSIS — Z794 Long term (current) use of insulin: Secondary | ICD-10-CM | POA: Diagnosis not present

## 2018-08-19 DIAGNOSIS — I10 Essential (primary) hypertension: Secondary | ICD-10-CM | POA: Diagnosis not present

## 2018-08-30 ENCOUNTER — Other Ambulatory Visit: Payer: Self-pay | Admitting: Family Medicine

## 2018-09-02 ENCOUNTER — Other Ambulatory Visit: Payer: Self-pay

## 2018-09-02 ENCOUNTER — Telehealth: Payer: Self-pay | Admitting: Family Medicine

## 2018-09-02 MED ORDER — METFORMIN HCL 500 MG PO TABS: ORAL_TABLET | ORAL | 1 refills | Status: DC

## 2018-09-02 NOTE — Telephone Encounter (Signed)
Pt needs refill sent in to [facility], was not able to get refill from endocrinologist. Has appt in 10/21/18   metFORMIN (GLUCOPHAGE) 500 MG tablet [[id number]

## 2018-09-02 NOTE — Telephone Encounter (Signed)
Endocrinology, I believe that is who gives her that medication?

## 2018-09-02 NOTE — Telephone Encounter (Signed)
Has appt with who

## 2018-09-02 NOTE — Telephone Encounter (Signed)
Called and verified appt with Alyssa Schultz on March 6 @ 2:45 in [place]- will send in enough med to get to appt

## 2018-09-05 ENCOUNTER — Other Ambulatory Visit: Payer: Self-pay

## 2018-09-05 ENCOUNTER — Other Ambulatory Visit: Payer: Self-pay | Admitting: Family

## 2018-09-05 MED ORDER — HYDROCHLOROTHIAZIDE 25 MG PO TABS: ORAL_TABLET | ORAL | 0 refills | Status: DC

## 2018-10-11 ENCOUNTER — Other Ambulatory Visit
Admission: RE | Admit: 2018-10-11 | Discharge: 2018-10-11 | Disposition: A | Discharge status: Home / Self Care | Payer: 59 | Attending: Surgery | Admitting: Surgery

## 2018-10-11 DIAGNOSIS — E785 Hyperlipidemia, unspecified: Secondary | ICD-10-CM | POA: Insufficient documentation

## 2018-10-11 DIAGNOSIS — I10 Essential (primary) hypertension: Secondary | ICD-10-CM | POA: Diagnosis not present

## 2018-10-11 DIAGNOSIS — E119 Type 2 diabetes mellitus without complications: Secondary | ICD-10-CM | POA: Diagnosis not present

## 2018-10-11 LAB — LIPID PANEL
Cholesterol: 211 mg/dL — ABNORMAL HIGH (ref 0–200)
HDL: 69 mg/dL (ref >40)
LDL Cholesterol: 120 mg/dL — ABNORMAL HIGH (ref 0–99)
Total CHOL/HDL Ratio: 3.1 RATIO
Triglycerides: 109 mg/dL (ref <150)
VLDL: 22 mg/dL (ref 0–40)

## 2018-10-11 LAB — COMPREHENSIVE METABOLIC PANEL
ALK PHOS: 56 U/L (ref 38–126)
ALT: 15 U/L (ref 0–44)
AST: 21 U/L (ref 15–41)
Albumin: 4.6 g/dL (ref 3.5–5.0)
Anion gap: 10 (ref 5–15)
BILIRUBIN TOTAL: 0.6 mg/dL (ref 0.3–1.2)
BUN: 18 mg/dL (ref 6–20)
CALCIUM: 9.3 mg/dL (ref 8.9–10.3)
CO2: 28 mmol/L (ref 22–32)
CREATININE: 0.71 mg/dL (ref 0.44–1.00)
Chloride: 99 mmol/L (ref 98–111)
GFR calc non Af Amer: >60 mL/min (ref >60)
GLUCOSE: 128 mg/dL — AB (ref 70–99)
Potassium: 3.8 mmol/L (ref 3.5–5.1)
SODIUM: 137 mmol/L (ref 135–145)
Total Protein: 8.5 g/dL — ABNORMAL HIGH (ref 6.5–8.1)

## 2018-10-11 LAB — HEMOGLOBIN A1C
Hgb A1c MFr Bld: 7 % — ABNORMAL HIGH (ref 4.8–5.6)
Mean Plasma Glucose: 154.2 mg/dL

## 2018-10-21 DIAGNOSIS — E785 Hyperlipidemia, unspecified: Secondary | ICD-10-CM | POA: Diagnosis not present

## 2018-10-21 DIAGNOSIS — E119 Type 2 diabetes mellitus without complications: Secondary | ICD-10-CM | POA: Diagnosis not present

## 2018-10-21 DIAGNOSIS — I10 Essential (primary) hypertension: Secondary | ICD-10-CM | POA: Diagnosis not present

## 2018-10-21 DIAGNOSIS — E559 Vitamin D deficiency, unspecified: Secondary | ICD-10-CM | POA: Diagnosis not present

## 2018-11-04 ENCOUNTER — Other Ambulatory Visit: Payer: Self-pay | Admitting: Family Medicine

## 2018-11-16 MED FILL — ONGLYZA 5 MG TABLET: 5 | 30 days supply | Qty: 30 | Fill #0

## 2018-11-16 MED FILL — ACCU-CHEK GUIDE TEST STRIP: 90 days supply | Qty: 100 | Fill #0

## 2018-11-16 MED FILL — ACCU-CHEK FASTCLIX LANCETS: 90 days supply | Qty: 102 | Fill #0

## 2018-11-16 MED FILL — HYDROCHLOROTHIAZIDE 25 MG T: 25 | 90 days supply | Qty: 90 | Fill #0

## 2018-11-17 MED FILL — MELOXICAM 7.5 MG TABLET: 7.5 | 30 days supply | Qty: 30 | Fill #0

## 2018-11-17 MED FILL — LISINOPRIL 10 MG TABLET: 10 | 90 days supply | Qty: 90 | Fill #0

## 2018-12-11 MED FILL — MELOXICAM 7.5 MG TABLET: 7.5 | 30 days supply | Qty: 30 | Fill #0

## 2018-12-27 ENCOUNTER — Other Ambulatory Visit: Payer: Self-pay | Admitting: Family Medicine

## 2018-12-27 DIAGNOSIS — E119 Type 2 diabetes mellitus without complications: Secondary | ICD-10-CM

## 2018-12-28 ENCOUNTER — Other Ambulatory Visit: Payer: Self-pay | Admitting: Family Medicine

## 2018-12-28 DIAGNOSIS — E119 Type 2 diabetes mellitus without complications: Secondary | ICD-10-CM

## 2018-12-28 MED FILL — ONGLYZA 5 MG TABLET: 5 | 90 days supply | Qty: 90 | Fill #0

## 2019-01-10 ENCOUNTER — Ambulatory Visit: Payer: 59 | Admitting: Family Medicine

## 2019-01-12 ENCOUNTER — Other Ambulatory Visit
Admission: RE | Admit: 2019-01-12 | Discharge: 2019-01-12 | Disposition: A | Discharge status: Home / Self Care | Payer: 59 | Attending: Surgery | Admitting: Surgery

## 2019-01-12 DIAGNOSIS — E785 Hyperlipidemia, unspecified: Secondary | ICD-10-CM | POA: Insufficient documentation

## 2019-01-12 DIAGNOSIS — E119 Type 2 diabetes mellitus without complications: Secondary | ICD-10-CM | POA: Diagnosis not present

## 2019-01-12 LAB — COMPREHENSIVE METABOLIC PANEL
ALT: 15 U/L (ref 0–44)
AST: 19 U/L (ref 15–41)
Albumin: 4.2 g/dL (ref 3.5–5.0)
Alkaline Phosphatase: 37 U/L — ABNORMAL LOW (ref 38–126)
Anion gap: 9 (ref 5–15)
BUN: 18 mg/dL (ref 6–20)
CO2: 25 mmol/L (ref 22–32)
Calcium: 9.2 mg/dL (ref 8.9–10.3)
Chloride: 101 mmol/L (ref 98–111)
Creatinine, Ser: 0.66 mg/dL (ref 0.44–1.00)
GFR calc Af Amer: >60 mL/min (ref >60)
GFR calc non Af Amer: >60 mL/min (ref >60)
Glucose, Bld: 94 mg/dL (ref 70–99)
Potassium: 3.7 mmol/L (ref 3.5–5.1)
Sodium: 135 mmol/L (ref 135–145)
Total Bilirubin: 0.5 mg/dL (ref 0.3–1.2)
Total Protein: 7 g/dL (ref 6.5–8.1)

## 2019-01-12 LAB — HEMOGLOBIN A1C
Hgb A1c MFr Bld: 6.2 % — ABNORMAL HIGH (ref 4.8–5.6)
Mean Plasma Glucose: 131.24 mg/dL

## 2019-01-12 LAB — LIPID PANEL
Cholesterol: 252 mg/dL — ABNORMAL HIGH (ref 0–200)
HDL: 73 mg/dL (ref >40)
LDL Cholesterol: 167 mg/dL — ABNORMAL HIGH (ref 0–99)
Total CHOL/HDL Ratio: 3.5 RATIO
Triglycerides: 61 mg/dL (ref <150)
VLDL: 12 mg/dL (ref 0–40)

## 2019-01-20 DIAGNOSIS — E119 Type 2 diabetes mellitus without complications: Secondary | ICD-10-CM | POA: Diagnosis not present

## 2019-01-20 DIAGNOSIS — E785 Hyperlipidemia, unspecified: Secondary | ICD-10-CM | POA: Diagnosis not present

## 2019-01-20 DIAGNOSIS — I10 Essential (primary) hypertension: Secondary | ICD-10-CM | POA: Diagnosis not present

## 2019-01-24 MED FILL — metFORMIN HCL 500 MG TABS: 500 | 90 days supply | Qty: 270 | Fill #0

## 2019-01-26 MED FILL — MELOXICAM 7.5 MG TABLET: 7.5 | 30 days supply | Qty: 30 | Fill #1

## 2019-02-07 ENCOUNTER — Telehealth: Payer: Self-pay

## 2019-02-07 ENCOUNTER — Other Ambulatory Visit: Payer: Self-pay

## 2019-02-07 NOTE — Telephone Encounter (Signed)
LM stating that refill for test strips needed to be referred to either PCP or endocronoligist. It appears she sees [facility] endo.

## 2019-02-08 MED FILL — ACCU-CHEK GUIDE TEST STRIP: 90 days supply | Qty: 200 | Fill #0

## 2019-03-08 ENCOUNTER — Telehealth: Payer: Self-pay | Admitting: Family Medicine

## 2019-03-08 ENCOUNTER — Other Ambulatory Visit: Payer: Self-pay | Admitting: Family Medicine

## 2019-03-08 MED FILL — LISINOPRIL 10 MG TABLET: 10 | 90 days supply | Qty: 90 | Fill #0

## 2019-03-08 NOTE — Telephone Encounter (Signed)
Alyssa Schultz walked in to ask if anyone received her message for med refill for hydrochlorothiazide (HYDRODIURIL) 25 MG tablet [[id number]] She had left several messages. She also mention if she really needs to take this medicine, if so she needs another refill.  Order Details Dose, Route, Frequency: As Directed  Dispense Quantity: 90 tablet Refills: 0 Fills remaining: --        Sig: TAKE 1 TABLET BY MOUTH ONCE DAILY       Written Date: 11/04/18 Expiration Date: 11/04/19    Start Date: 11/04/18 End Date: --         Ordering Provider: -- DEA #:  -- NPI:  --   Authorizing Provider: Juline Patch, MD DEA #:  [id number] NPI:  [id number]   Ordering User:  Juline Patch, MD            Original Order:  hydrochlorothiazide (HYDRODIURIL) 25 MG tablet [[id number]]    Pharmacy:  [facility], [place] #:  --

## 2019-03-09 ENCOUNTER — Other Ambulatory Visit: Payer: Self-pay

## 2019-03-09 MED ORDER — HYDROCHLOROTHIAZIDE 25 MG PO TABS: ORAL_TABLET | ORAL | 0 refills | Status: DC

## 2019-03-09 MED FILL — HYDROCHLOROTHIAZIDE 25 MG T: 25 | 90 days supply | Qty: 90 | Fill #0

## 2019-03-09 NOTE — Telephone Encounter (Signed)
PLease call pt and let her know that I have ONLY received one message and that is her telephone message that she left on my machine yesterday afternoon. I will send in her HCTZ but she needs to be seen by appt with Dr Ronnald Ramp this month as she has not been seen since November. Thank you

## 2019-03-17 ENCOUNTER — Ambulatory Visit (INDEPENDENT_AMBULATORY_CARE_PROVIDER_SITE_OTHER): Payer: 59 | Admitting: Family Medicine

## 2019-03-17 ENCOUNTER — Other Ambulatory Visit: Payer: Self-pay

## 2019-03-17 ENCOUNTER — Telehealth: Payer: Self-pay | Admitting: Family Medicine

## 2019-03-17 ENCOUNTER — Encounter: Payer: Self-pay | Admitting: Family Medicine

## 2019-03-17 VITALS — BP 120/64 | HR 80 | Ht 65.5 in | Wt 155.0 lb

## 2019-03-17 DIAGNOSIS — I10 Essential (primary) hypertension: Secondary | ICD-10-CM

## 2019-03-17 DIAGNOSIS — E7801 Familial hypercholesterolemia: Secondary | ICD-10-CM

## 2019-03-17 DIAGNOSIS — E119 Type 2 diabetes mellitus without complications: Secondary | ICD-10-CM | POA: Diagnosis not present

## 2019-03-17 MED ORDER — ATORVASTATIN CALCIUM 10 MG PO TABS: 10.0000 mg | ORAL_TABLET | ORAL | 1 refills | Status: DC

## 2019-03-17 MED ORDER — ATORVASTATIN CALCIUM 10 MG PO TABS: 10.0000 mg | ORAL_TABLET | Freq: Every day | ORAL | 3 refills | Status: DC

## 2019-03-17 MED ORDER — LISINOPRIL 10 MG PO TABS: 10.0000 mg | ORAL_TABLET | Freq: Every day | ORAL | 1 refills | Status: DC

## 2019-03-17 MED ORDER — HYDROCHLOROTHIAZIDE 25 MG PO TABS: 25.0000 mg | ORAL_TABLET | Freq: Every day | ORAL | 0 refills | Status: DC

## 2019-03-17 MED ORDER — ROSUVASTATIN CALCIUM 10 MG PO TABS: 10.0000 mg | ORAL_TABLET | Freq: Every day | ORAL | 1 refills | Status: DC

## 2019-03-17 MED FILL — ROSUVASTATIN CALCIUM 10 MG: 10 | 90 days supply | Qty: 90 | Fill #0

## 2019-03-17 NOTE — Patient Instructions (Signed)

## 2019-03-17 NOTE — Telephone Encounter (Signed)
Alyssa Schultz said she's no longer taking the med for cresto and want to start taking the Lipitor that dr Ronnald Ramp had prescribe  for today's visit. And if you can send it to e-scripts at [facility].

## 2019-03-17 NOTE — Progress Notes (Unsigned)
Changed to Atorvastatin 10mg 

## 2019-03-17 NOTE — Progress Notes (Signed)
Date:  03/17/2019   Name:  Alyssa Schultz   DOB:  02-03-1966   MRN:  [id number]   Chief Complaint: Hypertension and Foot Pain (place on bottom of R) foot- has had it frozen before- it is back)  Hypertension This is a chronic problem. The current episode started more than 1 year ago. The problem is unchanged. The problem is controlled. Pertinent negatives include no anxiety, blurred vision, chest pain, headaches, malaise/fatigue, neck pain, orthopnea, palpitations, peripheral edema, PND, shortness of breath or sweats. There are no associated agents to hypertension. There are no known risk factors for coronary artery disease. Past treatments include ACE inhibitors and diuretics. The current treatment provides moderate improvement. There are no compliance problems.  There is no history of angina, kidney disease, CAD/MI, CVA, heart failure, left ventricular hypertrophy, PVD or retinopathy. There is no history of chronic renal disease, a hypertension causing med or renovascular disease.  Foot Pain This is a new problem. The current episode started 1 to 4 weeks ago. The problem occurs daily. The problem has been waxing and waning. Pertinent negatives include no chest pain, headaches or neck pain.    Review of Systems  Constitutional: Negative for malaise/fatigue.  Eyes: Negative for blurred vision.  Respiratory: Negative for shortness of breath.   Cardiovascular: Negative for chest pain, palpitations, orthopnea and PND.  Musculoskeletal: Negative for neck pain.  Neurological: Negative for headaches.    Patient Active Problem List   Diagnosis Date Noted  . Leg pain 02/25/2018  . Edema 02/25/2018  . Hyperlipidemia 12/10/2017  . Screening for breast cancer 06/03/2017  . Screen for colon cancer 06/03/2017  . Bronchitis 12/03/2016  . Left hip pain 06/04/2016  . Routine general medical examination at a health care facility 03/04/2016  . 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 Allergic rhinitis 10/14/2012  . Hypertension 11/09/2011  . Rash 10/01/2011  . Diabetes mellitus type 2, controlled ([place]) 05/21/2011  . BMI 30.0-30.9,adult 05/21/2011    Allergies  Allergen Reactions  . Influenza Vaccine Live   . Influenza Vaccines   . Morphine And Related Other (See Comments)    Makes her crazy  . Prednisone     "makes me crazy"  . Hydrocodone Rash    Past Surgical History:  Procedure Laterality Date  . abalsion    . ablation    . ANTERIOR CERVICAL DECOMP/DISCECTOMY FUSION N/A 09/19/2015   Procedure: ANTERIOR CERVICAL DECOMPRESSION/DISCECTOMY FUSION 2 LEVELS;  Surgeon: Phylliss Bob, MD;  Location: [facility];  Service: Orthopedics;  Laterality: N/A;  Anterior cervical decompression fusion, cervical 3-4, cervical 4-5 decompression with instrumentation and allograft  . BACK SURGERY    . FACIAL RECONSTRUCTION SURGERY      Social History   Tobacco Use  . Smoking status: Never Smoker  . Smokeless tobacco: Never Used  Substance Use Topics  . Alcohol use: Yes    Comment: Occasional wine  . Drug use: No     Medication list has been reviewed and updated.  Current Meds  Medication Sig  . Accu-Chek FastClix Lancets MISC USE TO TEST ONCE DAILY  . glucose blood (ACCU-CHEK GUIDE) test strip Accu-Chek Guide strips  . hydrochlorothiazide (HYDRODIURIL) 25 MG tablet TAKE 1 TABLET BY MOUTH ONCE DAILY  . lisinopril (PRINIVIL,ZESTRIL) 10 MG tablet TAKE 1 TABLET BY MOUTH DAILY.  . metFORMIN (GLUCOPHAGE) 500 MG tablet TAKE 1 TABLET BY MOUTH 2 TIMES DAILY WITH A MEAL (Patient taking differently: Take 500 mg  by mouth 3 (three) times daily. TAKE 1 TABLET BY MOUTH 3 TIMES DAILY WITH A MEAL/ Dr Honor Junes)  . saxagliptin HCl (ONGLYZA) 5 MG TABS tablet Take 1 tablet (5 mg total) by mouth daily.  . TRUEPLUS LANCETS 30G MISC USE AS DIRECTED    PHQ 2/9 Scores 04/21/2018 12/23/2017 06/03/2017 12/03/2016  PHQ - 2 Score 0 0 0 0  PHQ- 9 Score 0 - - -    BP  Readings from Last 3 Encounters:  03/17/19 120/64  06/23/18 100/70  05/03/18 120/70    Physical Exam Vitals signs and nursing note reviewed.  Constitutional:      Appearance: She is well-developed.  HENT:     Head: Normocephalic.     Right Ear: External ear normal.     Left Ear: External ear normal.  Eyes:     General: Lids are everted, no foreign bodies appreciated. No scleral icterus.       Left eye: No foreign body or hordeolum.     Conjunctiva/sclera: Conjunctivae normal.     Right eye: Right conjunctiva is not injected.     Left eye: Left conjunctiva is not injected.     Pupils: Pupils are equal, round, and reactive to light.  Neck:     Musculoskeletal: Normal range of motion and neck supple.     Thyroid: No thyromegaly.     Vascular: No JVD.     Trachea: No tracheal deviation.  Cardiovascular:     Rate and Rhythm: Normal rate and regular rhythm.     Heart sounds: Normal heart sounds. No murmur. No friction rub. No gallop.   Pulmonary:     Effort: Pulmonary effort is normal. No respiratory distress.     Breath sounds: Normal breath sounds. No wheezing or rales.  Abdominal:     General: Bowel sounds are normal.     Palpations: Abdomen is soft. There is no mass.     Tenderness: There is no abdominal tenderness. There is no guarding or rebound.  Musculoskeletal: Normal range of motion.        General: No tenderness.  Lymphadenopathy:     Cervical: No cervical adenopathy.  Skin:    General: Skin is warm.     Findings: No rash.  Neurological:     Mental Status: She is alert and oriented to person, place, and time.     Cranial Nerves: No cranial nerve deficit.     Deep Tendon Reflexes: Reflexes normal.  Psychiatric:        Mood and Affect: Mood is not anxious or depressed.     Wt Readings from Last 3 Encounters:  03/17/19 155 lb (70.3 kg)  06/23/18 177 lb (80.3 kg)  05/03/18 180 lb (81.6 kg)    BP 120/64   Pulse 80   Ht 5' 5.5" (1.664 m)   Wt 155 lb (70.3  kg)   BMI 25.40 kg/m   Assessment and Plan:  1. Essential hypertension Chronic.  Controlled.  Continue lisinopril 10 mg once a day and hydrochlorothiazide 25 mg once a day. - lisinopril (ZESTRIL) 10 MG tablet; Take 1 tablet (10 mg total) by mouth daily.  Dispense: 90 tablet; Refill: 1 - hydrochlorothiazide (HYDRODIURIL) 25 MG tablet; Take 1 tablet (25 mg total) by mouth daily.  Dispense: 90 tablet; Refill: 0  2. Familial hypercholesterolemia Chronic.  Controlled.  Continue hyper lipidemia medication - rosuvastatin 10 MG tablet; Take 1 tablet (10 mg total) by mouth daily.  Dispense: 90 tablet; Refill: 3  3. Controlled type 2 diabetes mellitus without complication, without long-term current use of insulin (HCC) Chronic.  Controlled.  Followed by Dr. Honor Junes.  Continue lisinopril 10 mg once a day. - lisinopril (ZESTRIL) 10 MG tablet; Take 1 tablet (10 mg total) by mouth daily.  Dispense: 90 tablet; Refill: 1

## 2019-03-22 MED FILL — ONGLYZA 5 MG TABLET: 5 | 90 days supply | Qty: 90 | Fill #1

## 2019-03-27 MED FILL — ACCU-CHEK FASTCLIX LANCETS: 89 days supply | Qty: 204 | Fill #0

## 2019-04-11 ENCOUNTER — Other Ambulatory Visit: Payer: Self-pay

## 2019-04-11 DIAGNOSIS — E7801 Familial hypercholesterolemia: Secondary | ICD-10-CM

## 2019-04-11 NOTE — Progress Notes (Unsigned)
Put lipid in

## 2019-04-14 ENCOUNTER — Other Ambulatory Visit
Admission: RE | Admit: 2019-04-14 | Discharge: 2019-04-14 | Disposition: A | Discharge status: Home / Self Care | Payer: 59 | Attending: Family Medicine | Admitting: Family Medicine

## 2019-04-14 ENCOUNTER — Other Ambulatory Visit
Admission: RE | Admit: 2019-04-14 | Discharge: 2019-04-14 | Disposition: A | Discharge status: Home / Self Care | Payer: 59 | Attending: Surgery | Admitting: Surgery

## 2019-04-14 DIAGNOSIS — E119 Type 2 diabetes mellitus without complications: Secondary | ICD-10-CM | POA: Insufficient documentation

## 2019-04-14 LAB — LIPID PANEL
Cholesterol: 193 mg/dL (ref 0–200)
HDL: 69 mg/dL (ref >40)
LDL Cholesterol: 115 mg/dL — ABNORMAL HIGH (ref 0–99)
Total CHOL/HDL Ratio: 2.8 RATIO
Triglycerides: 45 mg/dL (ref <150)
VLDL: 9 mg/dL (ref 0–40)

## 2019-04-14 LAB — HEMOGLOBIN A1C
Hgb A1c MFr Bld: 6.1 % — ABNORMAL HIGH (ref 4.8–5.6)
Mean Plasma Glucose: 128.37 mg/dL

## 2019-04-18 MED FILL — metFORMIN HCL 500 MG TABS: 500 | 90 days supply | Qty: 270 | Fill #1

## 2019-04-26 MED FILL — MELOXICAM 7.5 MG TABLET: 7.5 | 30 days supply | Qty: 30 | Fill #2

## 2019-05-02 ENCOUNTER — Ambulatory Visit
Admission: EM | Admit: 2019-05-02 | Discharge: 2019-05-02 | Disposition: A | Discharge status: Home / Self Care | Payer: 59

## 2019-05-02 ENCOUNTER — Encounter: Payer: Self-pay | Admitting: Emergency Medicine

## 2019-05-02 ENCOUNTER — Other Ambulatory Visit: Payer: Self-pay

## 2019-05-02 DIAGNOSIS — M5441 Lumbago with sciatica, right side: Secondary | ICD-10-CM

## 2019-05-02 MED ORDER — KETOROLAC TROMETHAMINE 60 MG/2ML IM SOLN
60.0000 mg | Freq: Once | INTRAMUSCULAR | Status: AC
  Administered 2019-05-02: 60 mg via INTRAMUSCULAR

## 2019-05-02 NOTE — ED Triage Notes (Signed)
Patient c/o right leg pain that started several weeks ago. She states the pain radiates from her back down through her leg to her right ankle.

## 2019-05-02 NOTE — Discharge Instructions (Addendum)
It was very nice seeing you today in clinic. Thank you for entrusting me with your care.   You were given a Toradol injection in the clinic today. You elected to avoid steroids as they cause significant blood sugar elevations. May continue Tylenol and Mobic as already prescribed. May also use the Tramadol that you have. If not improving, please follow up with your orthopedic doctor to discuss repeat epidural injections.    If your symptoms/condition worsens, please seek follow up care either here or in the ER. Please remember, our [place] [facility] providers are "right here with you" when you need [place].   Again, it was my pleasure to take care of you today. Thank you for choosing our clinic. I hope that you start to feel better quickly.   Honor Loh, MSN, APRN, FNP-C, CEN Advanced Practice Provider [facility]

## 2019-05-02 NOTE — ED Provider Notes (Signed)
[place], [place]   Name: Alyssa Schultz DOB: 1966-07-14 MRN: [id number] CSN: [id number] PCP: Juline Patch, MD  Arrival date and time:  05/02/19 1419  Chief Complaint:  Leg Pain   NOTE: Prior to seeing the patient today, I have reviewed the triage nursing documentation and vital signs. Clinical staff has updated patient's PMH/PSHx, current medication list, and drug allergies/intolerances to ensure comprehensive history available to assist in medical decision making.   History:   HPI: Alyssa Schultz is a 53 y.o. female who presents today with complaints of lower back pain that has been going on for week. Patient denies injury. Pain is mainly in the RIGHT side of her back and radiates into her RIGHT lower extremity culminating at the level of the ankle. She has not appreciated any weakness in her legs. Patient denies any acute issues with bowel or bladder incontinence. No saddle anesthesia. Patient notes that pain is exacerbated by certain movements and prolonged sitting/standing. Patient has been having difficulties sleeping for the last few days due to the increasing pain. She reports that she slept in the floor last night in efforts to find a comfortable position. Patient has been seen in the past by an orthopedic surgeon and has received epidural steroid injections. Patient states, "I have not called him this time. He wants to hold off giving me anymore injections if he can because they jacked up my blood sugar". Patient has known T2DM and reports that she is sensitive to oral and parenteral steroids. With her last injections, she notes that her blood sugars ran over 300 for awhile, which caused her to have to take time off of work to treat. Additionally, she notes that oral Prednisone "runs her crazy" and causes changes to her mental status/ she states, "the last time that I took Prednisone, I called my friend and told her my car was stuck in the snow downtown. It wasn't though". Patient has been  taking meloxicam and APAP at home with minimal relief. She has a supply of Tramadol, however does not like to take it because it makes her feel "groggy". Patient presents today requesting a ketorolac injecting citing that they have worked for her in the past.   Past Medical History:  Diagnosis Date   Diabetes mellitus    dx 2014   just take pills   Hyperlipidemia    Hypertension     Past Surgical History:  Procedure Laterality Date   abalsion     ablation     ANTERIOR CERVICAL DECOMP/DISCECTOMY FUSION N/A 09/19/2015   Procedure: ANTERIOR CERVICAL DECOMPRESSION/DISCECTOMY FUSION 2 LEVELS;  Surgeon: Phylliss Bob, MD;  Location: [facility];  Service: Orthopedics;  Laterality: N/A;  Anterior cervical decompression fusion, cervical 3-4, cervical 4-5 decompression with instrumentation and allograft   BACK SURGERY     FACIAL RECONSTRUCTION SURGERY      Family History  Problem Relation Age of Onset   Diabetes Mother    Diabetes Maternal Grandmother    Hypertension Maternal Grandfather    Breast cancer Neg Hx     Social History   Tobacco Use   Smoking status: Never Smoker   Smokeless tobacco: Never Used  Substance Use Topics   Alcohol use: Yes    Comment: Occasional wine   Drug use: No    Patient Active Problem List   Diagnosis Date Noted   Leg pain 02/25/2018   Edema 02/25/2018   Hyperlipidemia 12/10/2017   Screening for breast cancer 06/03/2017   Screen  for colon cancer 06/03/2017   Bronchitis 12/03/2016   Left hip pain 06/04/2016   Routine general medical examination at a health care facility 03/04/2016   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 09/19/2015   Allergic rhinitis 10/14/2012   Hypertension 11/09/2011   Rash 10/01/2011   Diabetes mellitus type 2, controlled ([place]) 05/21/2011   BMI 30.0-30.9,adult 05/21/2011    Home Medications:    Current Meds  Medication Sig   Accu-Chek FastClix Lancets MISC USE TO TEST  ONCE DAILY   atorvastatin (LIPITOR) 10 MG tablet Take 1 tablet (10 mg total) by mouth 3 (three) times a week.   glucose blood (ACCU-CHEK GUIDE) test strip Accu-Chek Guide strips   hydrochlorothiazide (HYDRODIURIL) 25 MG tablet Take 1 tablet (25 mg total) by mouth daily.   lisinopril (ZESTRIL) 10 MG tablet Take 1 tablet (10 mg total) by mouth daily.   meclizine (ANTIVERT) 50 MG tablet Take 1 tablet (50 mg total) by mouth 3 (three) times daily as needed for dizziness.   meloxicam (MOBIC) 7.5 MG tablet Take 7.5 mg by mouth 2 (two) times daily.   metFORMIN (GLUCOPHAGE) 500 MG tablet TAKE 1 TABLET BY MOUTH 2 TIMES DAILY WITH A MEAL (Patient taking differently: Take 500 mg by mouth 3 (three) times daily. TAKE 1 TABLET BY MOUTH 3 TIMES DAILY WITH A MEAL/ Dr Honor Junes)   saxagliptin HCl (ONGLYZA) 5 MG TABS tablet Take 1 tablet (5 mg total) by mouth daily.   TRUEPLUS LANCETS 30G MISC USE AS DIRECTED    Allergies:   Influenza vaccine live, Influenza vaccines, Morphine and related, Prednisone, and Hydrocodone  Review of Systems (ROS): Review of Systems  Constitutional: Negative for chills and fever.  Respiratory: Negative for cough and shortness of breath.   Cardiovascular: Negative for chest pain and palpitations.  Musculoskeletal: Positive for back pain.  Neurological: Negative for dizziness, syncope, weakness, numbness and headaches.  All other systems reviewed and are negative.    Vital Signs: Today's Vitals   05/02/19 1432 05/02/19 1434 05/02/19 1505  BP:  90/78   Pulse:  76   Resp:  18   Temp:  98.2 F (36.8 C)   TempSrc:  Oral   SpO2:  98%   Weight: 151 lb (68.5 kg)    Height: 5\' 5"  (1.651 m)    PainSc: 8   8     Physical Exam: Physical Exam  Constitutional: She is oriented to person, place, and time and well-developed, well-nourished, and in no distress.  HENT:  Head: Normocephalic and atraumatic.  Mouth/Throat: Mucous membranes are normal.  Eyes: Pupils are equal,  round, and reactive to light. EOM are normal.  Neck: Normal range of motion. Neck supple. No tracheal deviation present.  Cardiovascular: Normal rate, regular rhythm, normal heart sounds and intact distal pulses. Exam reveals no gallop and no friction rub.  No murmur heard. Pulmonary/Chest: Effort normal and breath sounds normal. No respiratory distress. She has no wheezes. She has no rales.  Musculoskeletal:     Lumbar back: She exhibits pain (RIGHT paralumbar muscle group; pain radiates down posterior aspect of RLE and culminates at ankle). She exhibits normal range of motion and no spasm.     Comments: No midline tenderness. No gross deformity.   Neurological: She is alert and oriented to person, place, and time. Gait normal.  Skin: Skin is warm and dry. No rash noted.  Psychiatric: Mood, memory, affect and judgment normal.  Nursing note and vitals reviewed.   Urgent  Care Treatments / Results:   LABS: PLEASE NOTE: all labs that were ordered this encounter are listed, however only abnormal results are displayed. Labs Reviewed - No data to display  EKG: -None  RADIOLOGY: No results found.  PROCEDURES: Procedures  MEDICATIONS RECEIVED THIS VISIT: Medications  ketorolac (TORADOL) injection 60 mg (60 mg Intramuscular Given 05/02/19 1502)    PERTINENT CLINICAL COURSE NOTES/UPDATES:   Initial Impression / Assessment and Plan / Urgent Care Course:  Pertinent labs & imaging results that were available during my care of the patient were personally reviewed by me and considered in my medical decision making (see lab/imaging section of note for values and interpretations).  Alyssa Schultz is a 53 y.o. female who presents to [facility] today with complaints of RIGHT lower back pain with radiation into RIGHT lower leg.  Patient is well appearing overall in clinic today. She does not appear to be in any acute distress. Presenting symptoms (see HPI) and exam as documented above.  Pain similar to previous exacerbations. Dicussed likely recurrent nerve root compression. Patient wishes to defer any any of steroids at this time citing significant elevations in her blood glucose levels. Ketorolac 60 mg IM given in clinic per request. She will continue her meloxicam and APAP as needed at home. She was encouraged to apply ice TID for at least 10-15 minutes at a time.  Patient encouraged to contact her orthopedic surgeon to discuss need for ongoing treatment.  Patient to return call to clinic if not improving.   Discussed follow up with primary care physician in 1 week for re-evaluation. I have reviewed the follow up and strict return precautions for any new or worsening symptoms. Patient is aware of symptoms that would be deemed urgent/emergent, and would thus require further evaluation either here or in the emergency department. At the time of discharge, she verbalized understanding and consent with the discharge plan as it was reviewed with her. All questions were fielded by provider and/or clinic staff prior to patient discharge.    Final Clinical Impressions / Urgent Care Diagnoses:   Final diagnoses:  Right-sided low back pain with right-sided sciatica, unspecified chronicity    New Prescriptions:  [place] Controlled Substance Registry consulted? Not Applicable  Meds ordered this encounter  Medications   ketorolac (TORADOL) injection 60 mg    Recommended Follow up Care:  Patient encouraged to follow up with the following provider within the specified time frame, or sooner as dictated by the severity of her symptoms. As always, she was instructed that for any urgent/emergent care needs, she should seek care either here or in the emergency department for more immediate evaluation.  Follow-up Information    Juline Patch, MD In 1 week.   Specialty: Family Medicine Why: General reassessment of symptoms if not improving Contact information: [address] [place] [address] [phone]         NOTE: This note was prepared using Dragon dictation software along with smaller phrase technology. Despite my best ability to proofread, there is the potential that transcriptional errors may still occur from this process, and are completely unintentional.     Karen Kitchens, NP 05/02/19 1756

## 2019-05-04 MED FILL — ACCU-CHEK GUIDE TEST STRIP: 90 days supply | Qty: 200 | Fill #1

## 2019-05-05 DIAGNOSIS — M5416 Radiculopathy, lumbar region: Secondary | ICD-10-CM | POA: Diagnosis not present

## 2019-05-08 ENCOUNTER — Other Ambulatory Visit: Payer: Self-pay | Admitting: Orthopedic Surgery

## 2019-05-08 DIAGNOSIS — M5416 Radiculopathy, lumbar region: Secondary | ICD-10-CM

## 2019-05-19 ENCOUNTER — Other Ambulatory Visit: Payer: Self-pay

## 2019-05-19 ENCOUNTER — Ambulatory Visit
Admission: RE | Admit: 2019-05-19 | Discharge: 2019-05-19 | Disposition: A | Discharge status: Home / Self Care | Payer: 59 | Source: Ambulatory Visit | Attending: Orthopedic Surgery | Admitting: Orthopedic Surgery

## 2019-05-19 DIAGNOSIS — M5416 Radiculopathy, lumbar region: Secondary | ICD-10-CM | POA: Insufficient documentation

## 2019-05-19 DIAGNOSIS — M5127 Other intervertebral disc displacement, lumbosacral region: Secondary | ICD-10-CM | POA: Diagnosis not present

## 2019-05-19 DIAGNOSIS — M48061 Spinal stenosis, lumbar region without neurogenic claudication: Secondary | ICD-10-CM | POA: Diagnosis not present

## 2019-05-23 DIAGNOSIS — M5416 Radiculopathy, lumbar region: Secondary | ICD-10-CM | POA: Diagnosis not present

## 2019-05-31 MED FILL — LISINOPRIL 10 MG TABS: 10 | 90 days supply | Qty: 90 | Fill #1

## 2019-05-31 MED FILL — HYDROCHLOROTHIAZIDE 25 MG T: 25 | 90 days supply | Qty: 90 | Fill #0

## 2019-06-01 ENCOUNTER — Other Ambulatory Visit: Payer: Self-pay

## 2019-06-01 ENCOUNTER — Encounter: Payer: Self-pay | Admitting: Family Medicine

## 2019-06-01 ENCOUNTER — Ambulatory Visit: Payer: 59 | Admitting: Family Medicine

## 2019-06-01 VITALS — BP 110/70 | HR 80 | Ht 65.0 in | Wt 152.0 lb

## 2019-06-01 DIAGNOSIS — N342 Other urethritis: Secondary | ICD-10-CM

## 2019-06-01 LAB — POCT URINALYSIS DIPSTICK
Bilirubin, UA: NEGATIVE
Blood, UA: NEGATIVE
Glucose, UA: NEGATIVE
Ketones, UA: NEGATIVE
Leukocytes, UA: NEGATIVE
Nitrite, UA: NEGATIVE
Protein, UA: NEGATIVE
Spec Grav, UA: 1.02 (ref 1.010–1.025)
Urobilinogen, UA: 0.2 E.U./dL
pH, UA: 6 (ref 5.0–8.0)

## 2019-06-01 MED ORDER — FLUCONAZOLE 150 MG PO TABS: 150.0000 mg | ORAL_TABLET | Freq: Once | ORAL | 0 refills | Status: AC

## 2019-06-01 MED ORDER — CEPHALEXIN 500 MG PO CAPS: 500.0000 mg | ORAL_CAPSULE | Freq: Two times a day (BID) | ORAL | 0 refills | Status: DC

## 2019-06-01 MED ORDER — NYSTATIN 100000 UNIT/GM EX CREA: 1.0000 "application " | TOPICAL_CREAM | Freq: Two times a day (BID) | CUTANEOUS | 0 refills | Status: DC

## 2019-06-01 NOTE — Addendum Note (Signed)
Addended by: Fredderick Severance on: 06/01/2019 04:53 PM   Modules accepted: Orders

## 2019-06-01 NOTE — Progress Notes (Signed)
Date:  06/01/2019   Name:  Alyssa Schultz   DOB:  1966-02-14   MRN:  [id number]   Chief Complaint: Urinary Tract Infection (urine smelling and burning when urinates x 2 days)  Urinary Tract Infection  This is a new problem. The current episode started in the past 7 days. The problem occurs intermittently. The problem has been gradually worsening. The quality of the pain is described as burning. The pain is mild. There has been no fever. Pertinent negatives include no chills, discharge, flank pain, frequency, hematuria, hesitancy, nausea, sweats, urgency or vomiting. The treatment provided moderate relief.    Review of Systems  Constitutional: Negative.  Negative for chills, fatigue, fever and unexpected weight change.  HENT: Negative for congestion, ear discharge, ear pain, rhinorrhea, sinus pressure, sneezing and sore throat.   Eyes: Negative for photophobia, pain, discharge, redness and itching.  Respiratory: Negative for cough, shortness of breath, wheezing and stridor.   Gastrointestinal: Negative for abdominal pain, blood in stool, constipation, diarrhea, nausea and vomiting.  Endocrine: Negative for cold intolerance, heat intolerance, polydipsia, polyphagia and polyuria.  Genitourinary: Negative for dysuria, flank pain, frequency, hematuria, hesitancy, menstrual problem, pelvic pain, urgency, vaginal bleeding and vaginal discharge.  Musculoskeletal: Negative for arthralgias, back pain and myalgias.  Skin: Negative for rash.  Allergic/Immunologic: Negative for environmental allergies and food allergies.  Neurological: Negative for dizziness, weakness, light-headedness, numbness and headaches.  Hematological: Negative for adenopathy. Does not bruise/bleed easily.  Psychiatric/Behavioral: Negative for dysphoric mood. The patient is not nervous/anxious.     Patient Active Problem List   Diagnosis Date Noted  . Leg pain 02/25/2018  . Edema 02/25/2018  . Hyperlipidemia 12/10/2017   . Screening for breast cancer 06/03/2017  . Screen for colon cancer 06/03/2017  . Bronchitis 12/03/2016  . Left hip pain 06/04/2016  . Routine general medical examination at a health care facility 03/04/2016  . 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 Allergic rhinitis 10/14/2012  . Hypertension 11/09/2011  . Rash 10/01/2011  . Diabetes mellitus type 2, controlled ([place]) 05/21/2011  . BMI 30.0-30.9,adult 05/21/2011    Allergies  Allergen Reactions  . Influenza Vaccine Live   . Influenza Vaccines   . Morphine And Related Other (See Comments)    Makes her crazy  . Prednisone     "makes me crazy"  . Hydrocodone Rash    Past Surgical History:  Procedure Laterality Date  . abalsion    . ablation    . ANTERIOR CERVICAL DECOMP/DISCECTOMY FUSION N/A 09/19/2015   Procedure: ANTERIOR CERVICAL DECOMPRESSION/DISCECTOMY FUSION 2 LEVELS;  Surgeon: Phylliss Bob, MD;  Location: [facility];  Service: Orthopedics;  Laterality: N/A;  Anterior cervical decompression fusion, cervical 3-4, cervical 4-5 decompression with instrumentation and allograft  . BACK SURGERY    . FACIAL RECONSTRUCTION SURGERY      Social History   Tobacco Use  . Smoking status: Never Smoker  . Smokeless tobacco: Never Used  Substance Use Topics  . Alcohol use: Yes    Comment: Occasional wine  . Drug use: No     Medication list has been reviewed and updated.  Current Meds  Medication Sig  . Accu-Chek FastClix Lancets MISC USE TO TEST ONCE DAILY  . atorvastatin (LIPITOR) 10 MG tablet Take 1 tablet (10 mg total) by mouth 3 (three) times a week.  Marland Kitchen glucose blood (ACCU-CHEK GUIDE) test strip Accu-Chek Guide strips  . hydrochlorothiazide (HYDRODIURIL) 25 MG tablet Take 1 tablet (25  mg total) by mouth daily.  Marland Kitchen lisinopril (ZESTRIL) 10 MG tablet Take 1 tablet (10 mg total) by mouth daily.  . meclizine (ANTIVERT) 50 MG tablet Take 1 tablet (50 mg total) by mouth 3 (three) times daily as  needed for dizziness.  . metFORMIN (GLUCOPHAGE) 500 MG tablet TAKE 1 TABLET BY MOUTH 2 TIMES DAILY WITH A MEAL (Patient taking differently: Take 500 mg by mouth 3 (three) times daily. TAKE 1 TABLET BY MOUTH 3 TIMES DAILY WITH A MEAL/ Dr Honor Junes)  . saxagliptin HCl (ONGLYZA) 5 MG TABS tablet Take 1 tablet (5 mg total) by mouth daily.  . TRUEPLUS LANCETS 30G MISC USE AS DIRECTED    PHQ 2/9 Scores 06/01/2019 04/21/2018 12/23/2017 06/03/2017  PHQ - 2 Score 0 0 0 0  PHQ- 9 Score 0 0 - -    BP Readings from Last 3 Encounters:  06/01/19 110/70  05/02/19 90/78  03/17/19 120/64    Physical Exam Vitals signs and nursing note reviewed.  Constitutional:      General: She is not in acute distress.    Appearance: She is not diaphoretic.  HENT:     Head: Normocephalic and atraumatic.     Right Ear: Tympanic membrane, ear canal and external ear normal.     Left Ear: Tympanic membrane, ear canal and external ear normal.     Nose: Nose normal. No congestion or rhinorrhea.  Eyes:     General:        Right eye: No discharge.        Left eye: No discharge.     Conjunctiva/sclera: Conjunctivae normal.     Pupils: Pupils are equal, round, and reactive to light.  Neck:     Musculoskeletal: Normal range of motion and neck supple.     Thyroid: No thyromegaly.     Vascular: No JVD.  Cardiovascular:     Rate and Rhythm: Normal rate and regular rhythm.     Heart sounds: Normal heart sounds. No murmur. No friction rub. No gallop.   Pulmonary:     Effort: Pulmonary effort is normal. No respiratory distress.     Breath sounds: Normal breath sounds. No stridor. No wheezing, rhonchi or rales.  Chest:     Chest wall: No tenderness.  Abdominal:     General: Bowel sounds are normal.     Palpations: Abdomen is soft. There is no mass.     Tenderness: There is abdominal tenderness in the suprapubic area. There is no right CVA tenderness, left CVA tenderness or guarding.  Musculoskeletal: Normal range of  motion.  Lymphadenopathy:     Cervical: No cervical adenopathy.  Skin:    General: Skin is warm and dry.     Capillary Refill: Capillary refill takes less than 2 seconds.     Coloration: Skin is not jaundiced or pale.     Findings: No bruising, erythema, lesion or rash.  Neurological:     Mental Status: She is alert.     Deep Tendon Reflexes: Reflexes are normal and symmetric.     Wt Readings from Last 3 Encounters:  06/01/19 152 lb (68.9 kg)  05/02/19 151 lb (68.5 kg)  03/17/19 155 lb (70.3 kg)    BP 110/70   Pulse 80   Ht 5\' 5"  (1.651 m)   Wt 152 lb (68.9 kg)   BMI 25.29 kg/m   Assessment and Plan:  1. Urethritis Patient's history together with her physical exam/tenderness suprapubic and urinalysis is suggestive of  a urethritis issue.  We will initiate cephalexin 500 mg twice a day for 3 days as well as nystatin cream to be applied to the periurethral and labial areas twice a day as well.  Patient is also been given a Diflucan 150 mg to be used on a as needed basis if this is not resolved the issue. - cephALEXin (KEFLEX) 500 MG capsule; Take 1 capsule (500 mg total) by mouth 2 (two) times daily.  Dispense: 6 capsule; Refill: 0 - nystatin cream (MYCOSTATIN); Apply 1 application topically 2 (two) times daily.  Dispense: 30 g; Refill: 0 - fluconazole (DIFLUCAN) 150 MG tablet; Take 1 tablet (150 mg total) by mouth once for 1 dose.  Dispense: 1 tablet; Refill: 0

## 2019-06-02 ENCOUNTER — Other Ambulatory Visit: Payer: Self-pay | Admitting: Family Medicine

## 2019-06-02 DIAGNOSIS — N342 Other urethritis: Secondary | ICD-10-CM

## 2019-06-06 ENCOUNTER — Encounter: Payer: Self-pay | Admitting: Family Medicine

## 2019-06-06 ENCOUNTER — Other Ambulatory Visit: Payer: Self-pay

## 2019-06-06 DIAGNOSIS — N342 Other urethritis: Secondary | ICD-10-CM | POA: Diagnosis not present

## 2019-06-06 MED ORDER — NITROFURANTOIN MONOHYD MACRO 100 MG PO CAPS: 100.0000 mg | ORAL_CAPSULE | Freq: Two times a day (BID) | ORAL | 0 refills | Status: AC

## 2019-06-07 DIAGNOSIS — M5416 Radiculopathy, lumbar region: Secondary | ICD-10-CM | POA: Diagnosis not present

## 2019-06-08 ENCOUNTER — Encounter: Payer: Self-pay | Admitting: Family Medicine

## 2019-06-08 LAB — URINE CULTURE: Organism ID, Bacteria: NO GROWTH

## 2019-06-08 LAB — SPECIMEN STATUS REPORT

## 2019-06-09 MED FILL — ROSUVASTATIN CALCIUM 10 MG: 10 | 90 days supply | Qty: 90 | Fill #1

## 2019-06-10 ENCOUNTER — Other Ambulatory Visit: Payer: Self-pay | Admitting: Family Medicine

## 2019-06-10 DIAGNOSIS — N342 Other urethritis: Secondary | ICD-10-CM

## 2019-06-12 ENCOUNTER — Other Ambulatory Visit: Payer: Self-pay | Admitting: Family Medicine

## 2019-06-12 DIAGNOSIS — N342 Other urethritis: Secondary | ICD-10-CM

## 2019-06-12 MED FILL — MELOXICAM 7.5 MG TABLET: 7.5 | 30 days supply | Qty: 30 | Fill #0

## 2019-06-18 MED FILL — ACCU-CHEK FASTCLIX LANCETS: 89 days supply | Qty: 204 | Fill #1

## 2019-06-21 MED FILL — ONGLYZA 5 MG TABLET: 5 | 90 days supply | Qty: 90 | Fill #0

## 2019-06-23 DIAGNOSIS — M5416 Radiculopathy, lumbar region: Secondary | ICD-10-CM | POA: Diagnosis not present

## 2019-06-27 ENCOUNTER — Other Ambulatory Visit: Payer: Self-pay | Admitting: Orthopedic Surgery

## 2019-07-03 MED FILL — LIDOCAINE PATCH 5%: 5 | 30 days supply | Qty: 30 | Fill #0

## 2019-07-04 ENCOUNTER — Other Ambulatory Visit: Payer: Self-pay | Admitting: Family Medicine

## 2019-07-04 DIAGNOSIS — Z1231 Encounter for screening mammogram for malignant neoplasm of breast: Secondary | ICD-10-CM

## 2019-07-07 MED FILL — MELOXICAM 7.5 MG TABLET: 7.5 | 30 days supply | Qty: 30 | Fill #0

## 2019-07-16 ENCOUNTER — Encounter: Payer: Self-pay | Admitting: Family Medicine

## 2019-07-17 ENCOUNTER — Other Ambulatory Visit
Admission: RE | Admit: 2019-07-17 | Discharge: 2019-07-17 | Disposition: A | Discharge status: Home / Self Care | Payer: 59 | Attending: Surgery | Admitting: Surgery

## 2019-07-17 ENCOUNTER — Other Ambulatory Visit: Payer: Self-pay

## 2019-07-17 DIAGNOSIS — E119 Type 2 diabetes mellitus without complications: Secondary | ICD-10-CM | POA: Insufficient documentation

## 2019-07-17 DIAGNOSIS — E785 Hyperlipidemia, unspecified: Secondary | ICD-10-CM | POA: Diagnosis not present

## 2019-07-17 DIAGNOSIS — I10 Essential (primary) hypertension: Secondary | ICD-10-CM

## 2019-07-17 DIAGNOSIS — E7801 Familial hypercholesterolemia: Secondary | ICD-10-CM

## 2019-07-17 LAB — HEMOGLOBIN A1C
Hgb A1c MFr Bld: 6.2 % — ABNORMAL HIGH (ref 4.8–5.6)
Mean Plasma Glucose: 131.24 mg/dL

## 2019-07-17 LAB — LIPID PANEL
Cholesterol: 183 mg/dL (ref 0–200)
HDL: 70 mg/dL (ref >40)
LDL Cholesterol: 102 mg/dL — ABNORMAL HIGH (ref 0–99)
Total CHOL/HDL Ratio: 2.6 RATIO
Triglycerides: 55 mg/dL (ref <150)
VLDL: 11 mg/dL (ref 0–40)

## 2019-07-17 LAB — COMPREHENSIVE METABOLIC PANEL
ALT: 14 U/L (ref 0–44)
AST: 18 U/L (ref 15–41)
Albumin: 4.4 g/dL (ref 3.5–5.0)
Alkaline Phosphatase: 43 U/L (ref 38–126)
Anion gap: 7 (ref 5–15)
BUN: 15 mg/dL (ref 6–20)
CO2: 26 mmol/L (ref 22–32)
Calcium: 9.3 mg/dL (ref 8.9–10.3)
Chloride: 103 mmol/L (ref 98–111)
Creatinine, Ser: 0.68 mg/dL (ref 0.44–1.00)
GFR calc Af Amer: >60 mL/min (ref >60)
GFR calc non Af Amer: >60 mL/min (ref >60)
Glucose, Bld: 100 mg/dL — ABNORMAL HIGH (ref 70–99)
Potassium: 4 mmol/L (ref 3.5–5.1)
Sodium: 136 mmol/L (ref 135–145)
Total Bilirubin: 0.6 mg/dL (ref 0.3–1.2)
Total Protein: 7.8 g/dL (ref 6.5–8.1)

## 2019-07-17 MED ORDER — HYDROCHLOROTHIAZIDE 25 MG PO TABS: 25.0000 mg | ORAL_TABLET | Freq: Every day | ORAL | 0 refills | Status: DC

## 2019-07-17 MED ORDER — LISINOPRIL 10 MG PO TABS: 10.0000 mg | ORAL_TABLET | Freq: Every day | ORAL | 0 refills | Status: DC

## 2019-07-17 MED ORDER — ATORVASTATIN CALCIUM 10 MG PO TABS: 10.0000 mg | ORAL_TABLET | ORAL | 0 refills | Status: DC

## 2019-07-17 MED FILL — ATORVASTATIN 10 MG TABLET: 10 | 84 days supply | Qty: 36 | Fill #0

## 2019-07-19 ENCOUNTER — Other Ambulatory Visit: Payer: Self-pay

## 2019-07-21 NOTE — Pre-Procedure Instructions (Addendum)
[place], [place] [address] Phone: [phone] Fax: [phone]  [place], [place] [address] Phone: [phone] Fax: [phone]     Your procedure is scheduled on Wednesday, December 9th, from 12:30PM to 5PM.  Report to [facility] Main Entrance "A" at 09:30 A.M., and check in at the Admitting office.  Call this number if you have problems the morning of surgery:  [phone].  Call [phone] if you have any questions prior to your surgery date Monday-Friday 8am-4pm.     Remember:  Do not eat after midnight the night before your surgery.  You may drink clear liquids until 09:30 AM the morning of your surgery.   Clear liquids allowed are: Water, Non-Citrus Juices (without pulp), Carbonated Beverages, Clear Tea, Black Coffee Only, and Gatorade.  Please complete your PRE-SURGERY G2 or 10oz Water that was provided to you by 09:30 AM the morning of surgery.  Please, if able, drink it in one setting. DO NOT SIP.     Take these medicines the morning of surgery with A SIP OF WATER: IF NEEDED: acetaminophen (TYLENOL) meclizine (ANTIVERT)   As of today, STOP taking any Aspirin (unless otherwise instructed by your surgeon), NSAIDs such as meloxicam (MOBIC), Aleve, Naproxen, Ibuprofen, Motrin, Advil, Goody's, BC's, all herbal medications, fish oil, and all vitamins.   WHAT DO I DO ABOUT MY DIABETES MEDICATION?  Marland Kitchen Do not take metFORMIN (GLUCOPHAGE), or saxagliptin HCl (ONGLYZA) the morning of surgery.  How do I manage my blood sugar before surgery? . Check your blood sugar at least 4 times a day, starting 2 days before surgery, to make sure that the level is not too high or low. . Check your blood sugar the morning of your surgery when you wake up and every 2 hours until you get to the Short Stay  unit. o If your blood sugar is less than 70 mg/dL, you will need to treat for low blood sugar: - Do not take insulin. - Treat a low blood sugar (less than 70 mg/dL) with  cup of clear juice (cranberry or apple), 4 glucose tablets, OR glucose gel. - Recheck blood sugar in 15 minutes after treatment (to make sure it is greater than 70 mg/dL). If your blood sugar is not greater than 70 mg/dL on recheck, call [phone] for further instructions. . Report your blood sugar to the short stay nurse when you get to Short Stay.  . If you are admitted to the hospital after surgery: o Your blood sugar will be checked by the staff and you will probably be given insulin after surgery (instead of oral diabetes medicines) to make sure you have good blood sugar levels. o The goal for blood sugar control after surgery is 80-180 mg/dL.   The Morning of Surgery  Do not wear jewelry, make-up or nail polish.  Do not wear lotions, powders, perfumes, or deodorant  Do not shave 48 hours prior to surgery.    Do not bring valuables to the hospital.  [facility] is not responsible for any belongings or valuables.  If you are a smoker, DO NOT Smoke 24 hours prior to surgery  If you wear a CPAP at night please bring your mask, tubing, and machine the morning of surgery   Remember that you must have someone to transport you home after your surgery, and  remain with you for 24 hours if you are discharged the same day.   Please bring cases for contacts, glasses, hearing aids, dentures or bridgework because it cannot be worn into surgery.    Leave your suitcase in the car.  After surgery it may be brought to your room.  For patients admitted to the hospital, discharge time will be determined by your treatment team.  Patients discharged the day of surgery will not be allowed to drive home.    Special instructions:   [facility]- Preparing For Surgery  Before surgery, you can play an important role. Because  skin is not sterile, your skin needs to be as free of germs as possible. You can reduce the number of germs on your skin by washing with CHG (chlorahexidine gluconate) Soap before surgery.  CHG is an antiseptic cleaner which kills germs and bonds with the skin to continue killing germs even after washing.    Oral Hygiene is also important to reduce your risk of infection.  Remember - BRUSH YOUR TEETH THE MORNING OF SURGERY WITH YOUR REGULAR TOOTHPASTE  Please do not use if you have an allergy to CHG or antibacterial soaps. If your skin becomes reddened/irritated stop using the CHG.  Do not shave (including legs and underarms) for at least 48 hours prior to first CHG shower. It is OK to shave your face.  Please follow these instructions carefully.   1. Shower the NIGHT BEFORE SURGERY and the MORNING OF SURGERY with CHG Soap.   2. If you chose to wash your hair, wash your hair first as usual with your normal shampoo.  3. After you shampoo, rinse your hair and body thoroughly to remove the shampoo.  4. Use CHG as you would any other liquid soap. You can apply CHG directly to the skin and wash gently with a scrungie or a clean washcloth.   5. Apply the CHG Soap to your body ONLY FROM THE NECK DOWN.  Do not use on open wounds or open sores. Avoid contact with your eyes, ears, mouth and genitals (private parts). Wash Face and genitals (private parts)  with your normal soap.   6. Wash thoroughly, paying special attention to the area where your surgery will be performed.  7. Thoroughly rinse your body with warm water from the neck down.  8. DO NOT shower/wash with your normal soap after using and rinsing off the CHG Soap.  9. Pat yourself dry with a CLEAN TOWEL.  10. Wear CLEAN PAJAMAS to bed the night before surgery, wear comfortable clothes the morning of surgery  11. Place CLEAN SHEETS on your bed the night of your first shower and DO NOT SLEEP WITH PETS.    Day of Surgery:  Remember to  brush your teeth WITH YOUR REGULAR TOOTHPASTE. Please shower the morning of surgery with the CHG soap Do not apply any deodorants/lotions. Please wear clean clothes to the hospital/surgery center.      Please read over the following fact sheets that you were given.

## 2019-07-24 ENCOUNTER — Other Ambulatory Visit (HOSPITAL_COMMUNITY)
Admission: RE | Admit: 2019-07-24 | Discharge: 2019-07-24 | Disposition: A | Discharge status: Home / Self Care | Payer: 59 | Source: Ambulatory Visit | Attending: Orthopedic Surgery | Admitting: Orthopedic Surgery

## 2019-07-24 ENCOUNTER — Ambulatory Visit: Payer: 59

## 2019-07-24 ENCOUNTER — Encounter (HOSPITAL_COMMUNITY)
Admission: RE | Admit: 2019-07-24 | Discharge: 2019-07-24 | Disposition: A | Discharge status: Home / Self Care | Payer: 59 | Source: Ambulatory Visit | Attending: Orthopedic Surgery | Admitting: Orthopedic Surgery

## 2019-07-24 ENCOUNTER — Other Ambulatory Visit: Payer: Self-pay

## 2019-07-24 ENCOUNTER — Encounter (HOSPITAL_COMMUNITY): Payer: Self-pay

## 2019-07-24 DIAGNOSIS — Z20828 Contact with and (suspected) exposure to other viral communicable diseases: Secondary | ICD-10-CM | POA: Diagnosis not present

## 2019-07-24 DIAGNOSIS — E785 Hyperlipidemia, unspecified: Secondary | ICD-10-CM | POA: Diagnosis not present

## 2019-07-24 DIAGNOSIS — Z7984 Long term (current) use of oral hypoglycemic drugs: Secondary | ICD-10-CM | POA: Diagnosis not present

## 2019-07-24 DIAGNOSIS — E119 Type 2 diabetes mellitus without complications: Secondary | ICD-10-CM | POA: Diagnosis not present

## 2019-07-24 DIAGNOSIS — Z791 Long term (current) use of non-steroidal anti-inflammatories (NSAID): Secondary | ICD-10-CM | POA: Diagnosis not present

## 2019-07-24 DIAGNOSIS — Z01818 Encounter for other preprocedural examination: Secondary | ICD-10-CM | POA: Insufficient documentation

## 2019-07-24 DIAGNOSIS — Z833 Family history of diabetes mellitus: Secondary | ICD-10-CM | POA: Diagnosis not present

## 2019-07-24 DIAGNOSIS — I10 Essential (primary) hypertension: Secondary | ICD-10-CM | POA: Diagnosis not present

## 2019-07-24 DIAGNOSIS — M48061 Spinal stenosis, lumbar region without neurogenic claudication: Secondary | ICD-10-CM | POA: Diagnosis not present

## 2019-07-24 DIAGNOSIS — E559 Vitamin D deficiency, unspecified: Secondary | ICD-10-CM | POA: Diagnosis not present

## 2019-07-24 DIAGNOSIS — M5416 Radiculopathy, lumbar region: Secondary | ICD-10-CM | POA: Diagnosis not present

## 2019-07-24 HISTORY — DX: Other complications of anesthesia, initial encounter: T88.59XA

## 2019-07-24 HISTORY — DX: Myoneural disorder, unspecified: G70.9

## 2019-07-24 LAB — COMPREHENSIVE METABOLIC PANEL
ALT: 14 U/L (ref 0–44)
AST: 21 U/L (ref 15–41)
Albumin: 4.2 g/dL (ref 3.5–5.0)
Alkaline Phosphatase: 44 U/L (ref 38–126)
Anion gap: 10 (ref 5–15)
BUN: 12 mg/dL (ref 6–20)
CO2: 27 mmol/L (ref 22–32)
Calcium: 9.6 mg/dL (ref 8.9–10.3)
Chloride: 101 mmol/L (ref 98–111)
Creatinine, Ser: 0.7 mg/dL (ref 0.44–1.00)
GFR calc Af Amer: >60 mL/min (ref >60)
GFR calc non Af Amer: >60 mL/min (ref >60)
Glucose, Bld: 78 mg/dL (ref 70–99)
Potassium: 3.5 mmol/L (ref 3.5–5.1)
Sodium: 138 mmol/L (ref 135–145)
Total Bilirubin: 0.7 mg/dL (ref 0.3–1.2)
Total Protein: 7.6 g/dL (ref 6.5–8.1)

## 2019-07-24 LAB — CBC WITH DIFFERENTIAL/PLATELET
Abs Immature Granulocytes: 0.03 10*3/uL (ref 0.00–0.07)
Basophils Absolute: 0.1 10*3/uL (ref 0.0–0.1)
Basophils Relative: 1 %
Eosinophils Absolute: 0.4 10*3/uL (ref 0.0–0.5)
Eosinophils Relative: 4 %
HCT: 40.5 % (ref 36.0–46.0)
Hemoglobin: 13.4 g/dL (ref 12.0–15.0)
Immature Granulocytes: 0 %
Lymphocytes Relative: 22 %
Lymphs Abs: 2 10*3/uL (ref 0.7–4.0)
MCH: 28.9 pg (ref 26.0–34.0)
MCHC: 33.1 g/dL (ref 30.0–36.0)
MCV: 87.3 fL (ref 80.0–100.0)
Monocytes Absolute: 0.7 10*3/uL (ref 0.1–1.0)
Monocytes Relative: 7 %
Neutro Abs: 6.1 10*3/uL (ref 1.7–7.7)
Neutrophils Relative %: 66 %
Platelets: 256 10*3/uL (ref 150–400)
RBC: 4.64 MIL/uL (ref 3.87–5.11)
RDW: 13.6 % (ref 11.5–15.5)
WBC: 9.2 10*3/uL (ref 4.0–10.5)
nRBC: 0 % (ref 0.0–0.2)

## 2019-07-24 LAB — URINALYSIS, ROUTINE W REFLEX MICROSCOPIC
Bilirubin Urine: NEGATIVE
Glucose, UA: NEGATIVE mg/dL
Hgb urine dipstick: NEGATIVE
Ketones, ur: NEGATIVE mg/dL
Leukocytes,Ua: NEGATIVE
Nitrite: NEGATIVE
Protein, ur: NEGATIVE mg/dL
Specific Gravity, Urine: 1.008 (ref 1.005–1.030)
pH: 6 (ref 5.0–8.0)

## 2019-07-24 LAB — ABO/RH: ABO/RH(D): O NEG

## 2019-07-24 LAB — APTT: aPTT: 29 seconds (ref 24–36)

## 2019-07-24 LAB — TYPE AND SCREEN
ABO/RH(D): O NEG
Antibody Screen: NEGATIVE

## 2019-07-24 LAB — SARS CORONAVIRUS 2 (TAT 6-24 HRS): SARS Coronavirus 2: NEGATIVE

## 2019-07-24 LAB — PROTIME-INR
INR: 0.9 (ref 0.8–1.2)
Prothrombin Time: 12.3 seconds (ref 11.4–15.2)

## 2019-07-24 LAB — SURGICAL PCR SCREEN
MRSA, PCR: NEGATIVE
Staphylococcus aureus: NEGATIVE

## 2019-07-24 LAB — GLUCOSE, CAPILLARY: Glucose-Capillary: 72 mg/dL (ref 70–99)

## 2019-07-24 NOTE — Progress Notes (Addendum)
PCP - Otilio Miu, MD Cardiologist - pt denies  Chest x-ray - 07/24/2019 at PAT appt. EKG - 07/24/2019 at PAT appt.  PPM/ICD- n/a DEVICE- n/a REP Notified- n/a  Stress Test - pt denies ECHO - pt denies  Cardiac Cath - pt denies   Sleep Study - no CPAP - n/a  Fasting Blood Sugar - 90-100 Checks Blood Sugar 2 times a day  Blood Thinner Instructions: n/a Aspirin Instructions: n/a  ERAS Protocol- Yes Ensure pre-surgery drink or water given- Yes-water  COVID testing- 07/24/2019  Anesthesia review: No  Patient denies shortness of breath, fever, cough and chest pain at PAT appointment  Patient verbalized understanding of instructions that were given to them at the PAT appointment. Patient was also instructed that they will need to review over the PAT instructions again at home before surgery.

## 2019-07-25 ENCOUNTER — Other Ambulatory Visit: Payer: Self-pay | Admitting: *Deleted

## 2019-07-25 DIAGNOSIS — E785 Hyperlipidemia, unspecified: Secondary | ICD-10-CM | POA: Diagnosis not present

## 2019-07-25 DIAGNOSIS — E559 Vitamin D deficiency, unspecified: Secondary | ICD-10-CM | POA: Diagnosis not present

## 2019-07-25 DIAGNOSIS — E119 Type 2 diabetes mellitus without complications: Secondary | ICD-10-CM | POA: Diagnosis not present

## 2019-07-25 DIAGNOSIS — I10 Essential (primary) hypertension: Secondary | ICD-10-CM | POA: Diagnosis not present

## 2019-07-25 MED FILL — ACCU-CHEK GUIDE TEST STRIP: 90 days supply | Qty: 200 | Fill #0

## 2019-07-25 MED FILL — metFORMIN HCL 500 MG TABS: 500 | 90 days supply | Qty: 270 | Fill #0

## 2019-07-25 NOTE — Patient Outreach (Signed)
[facility]) Care Management  07/25/2019  Alyssa Schultz 16-Nov-1965 [id number]   Preoperative Screening Call   Referral received: 07/24/2019 Surgery/procedure date: 07/26/19 Insurance: [employer]   Subjective:  Initial successful telephone call to patient's preferred number in order to complete preoperative screening.No answer able to leave a HIPAA compliant message.   1400 Received return call from patient .   2 HIPAA identifiers verified. Discussed purpose of preoperative call. Patient voices understanding and agrees to call. She states she  understands the reason for the surgery and she is ready to get it done . She discussed having difficulty with sitting and lying down at night , but tolerates being up well, reports walking up to 4 miles a day.  She understands the  expected time of arrival of surgery.  She  says he/she completed her/his preoperative testing on 07/24/19 and remains on quarantine since then having Covid test done she has no additional questions or anticipated discharge needs.  She expects to be in the hospital 1 to days .  She states she  has completed medical leave paperwork through [employer] ,  she states that she does have  the hospital indemnity benefit and is aware that she will have to file the claim after her surgery and has the contact number. . She states she will have 24/7 care at home provided by her sister and friends  to assist in her  recovery. She does not have and is not interested in completing advanced directives at this time. She  agrees to a post hospital discharge transition of care call.    Objective:  Per chart review, Alyssa Schultz is  scheduled for right sided lumbar 5-Sacrum 1 interbody fusion at [facility] on 07/26/19  . She completed pre-op testing on 07/24/19.  Assessment: Preoperative call completed, no preoperative needs identified.   Plan: RNCM will call patient for transition of care outreach within 72 hours of hospital  discharge notification.   Joylene Draft, RN, [place] Management Coordinator  [phone]- Mobile [phone]- Toll Free Main Office

## 2019-07-26 ENCOUNTER — Encounter (HOSPITAL_COMMUNITY): Payer: Self-pay

## 2019-07-26 ENCOUNTER — Inpatient Hospital Stay (HOSPITAL_COMMUNITY): Payer: 59

## 2019-07-26 ENCOUNTER — Inpatient Hospital Stay (HOSPITAL_COMMUNITY): Payer: 59 | Admitting: Anesthesiology

## 2019-07-26 ENCOUNTER — Encounter (HOSPITAL_COMMUNITY)
Admission: RE | Disposition: A | Discharge status: Home / Self Care | Payer: Self-pay | Source: Home / Self Care | Attending: Orthopedic Surgery

## 2019-07-26 ENCOUNTER — Inpatient Hospital Stay (HOSPITAL_COMMUNITY)
Admission: RE | Admit: 2019-07-26 | Discharge: 2019-07-27 | DRG: 455 | Disposition: A | Discharge status: Home / Self Care | Payer: 59 | Attending: Orthopedic Surgery | Admitting: Orthopedic Surgery

## 2019-07-26 ENCOUNTER — Other Ambulatory Visit: Payer: Self-pay

## 2019-07-26 DIAGNOSIS — M62830 Muscle spasm of back: Secondary | ICD-10-CM | POA: Diagnosis present

## 2019-07-26 DIAGNOSIS — M545 Low back pain: Secondary | ICD-10-CM | POA: Diagnosis present

## 2019-07-26 DIAGNOSIS — M48061 Spinal stenosis, lumbar region without neurogenic claudication: Principal | ICD-10-CM | POA: Diagnosis present

## 2019-07-26 DIAGNOSIS — M5410 Radiculopathy, site unspecified: Secondary | ICD-10-CM | POA: Diagnosis present

## 2019-07-26 DIAGNOSIS — I10 Essential (primary) hypertension: Secondary | ICD-10-CM | POA: Diagnosis present

## 2019-07-26 DIAGNOSIS — Z791 Long term (current) use of non-steroidal anti-inflammatories (NSAID): Secondary | ICD-10-CM | POA: Diagnosis not present

## 2019-07-26 DIAGNOSIS — E785 Hyperlipidemia, unspecified: Secondary | ICD-10-CM | POA: Diagnosis present

## 2019-07-26 DIAGNOSIS — E119 Type 2 diabetes mellitus without complications: Secondary | ICD-10-CM | POA: Diagnosis not present

## 2019-07-26 DIAGNOSIS — M532X6 Spinal instabilities, lumbar region: Secondary | ICD-10-CM | POA: Diagnosis not present

## 2019-07-26 DIAGNOSIS — Z20828 Contact with and (suspected) exposure to other viral communicable diseases: Secondary | ICD-10-CM | POA: Diagnosis present

## 2019-07-26 DIAGNOSIS — Z981 Arthrodesis status: Secondary | ICD-10-CM | POA: Diagnosis not present

## 2019-07-26 DIAGNOSIS — Z833 Family history of diabetes mellitus: Secondary | ICD-10-CM

## 2019-07-26 DIAGNOSIS — M5417 Radiculopathy, lumbosacral region: Secondary | ICD-10-CM | POA: Diagnosis not present

## 2019-07-26 DIAGNOSIS — Z79899 Other long term (current) drug therapy: Secondary | ICD-10-CM | POA: Diagnosis not present

## 2019-07-26 DIAGNOSIS — M5416 Radiculopathy, lumbar region: Secondary | ICD-10-CM

## 2019-07-26 DIAGNOSIS — Z7984 Long term (current) use of oral hypoglycemic drugs: Secondary | ICD-10-CM

## 2019-07-26 DIAGNOSIS — M48062 Spinal stenosis, lumbar region with neurogenic claudication: Secondary | ICD-10-CM | POA: Diagnosis not present

## 2019-07-26 DIAGNOSIS — Z419 Encounter for procedure for purposes other than remedying health state, unspecified: Secondary | ICD-10-CM

## 2019-07-26 HISTORY — PX: TRANSFORAMINAL LUMBAR INTERBODY FUSION (TLIF) WITH PEDICLE SCREW FIXATION 2 LEVEL: SHX6142

## 2019-07-26 LAB — GLUCOSE, CAPILLARY
Glucose-Capillary: 93 mg/dL (ref 70–99)
Glucose-Capillary: 76 mg/dL (ref 70–99)
Glucose-Capillary: 131 mg/dL — ABNORMAL HIGH (ref 70–99)
Glucose-Capillary: 196 mg/dL — ABNORMAL HIGH (ref 70–99)

## 2019-07-26 SURGERY — TRANSFORAMINAL LUMBAR INTERBODY FUSION (TLIF) WITH PEDICLE SCREW FIXATION 2 LEVEL
Anesthesia: General | Laterality: Right

## 2019-07-26 MED ORDER — LINAGLIPTIN 5 MG PO TABS
5.0000 mg | ORAL_TABLET | Freq: Every day | ORAL | Status: DC
  Administered 2019-07-27: 5 mg via ORAL
  Filled 2019-07-26: qty 1

## 2019-07-26 MED ORDER — HYDROMORPHONE HCL 1 MG/ML IJ SOLN
0.2500 mg | INTRAMUSCULAR | Status: DC | PRN
  Administered 2019-07-26: 0.5 mg via INTRAVENOUS

## 2019-07-26 MED ORDER — LISINOPRIL 10 MG PO TABS: 10.0000 mg | ORAL_TABLET | Freq: Every day | ORAL | Status: DC

## 2019-07-26 MED ORDER — MECLIZINE HCL 25 MG PO TABS
50.0000 mg | ORAL_TABLET | Freq: Three times a day (TID) | ORAL | Status: DC | PRN
  Filled 2019-07-26: qty 2

## 2019-07-26 MED ORDER — OXYCODONE-ACETAMINOPHEN 5-325 MG PO TABS
1.0000 | ORAL_TABLET | ORAL | Status: DC | PRN
  Administered 2019-07-26 – 2019-07-27 (×5): 1 via ORAL
  Filled 2019-07-26: qty 2
  Filled 2019-07-26 (×4): qty 1

## 2019-07-26 MED ORDER — ZOLPIDEM TARTRATE 5 MG PO TABS: 5.0000 mg | ORAL_TABLET | Freq: Every evening | ORAL | Status: DC | PRN

## 2019-07-26 MED ORDER — MENTHOL 3 MG MT LOZG: 1.0000 | LOZENGE | OROMUCOSAL | Status: DC | PRN

## 2019-07-26 MED ORDER — ROCURONIUM BROMIDE 10 MG/ML (PF) SYRINGE
PREFILLED_SYRINGE | INTRAVENOUS | Status: DC | PRN
  Administered 2019-07-26: 30 mg via INTRAVENOUS
  Administered 2019-07-26: 50 mg via INTRAVENOUS

## 2019-07-26 MED ORDER — INSULIN ASPART 100 UNIT/ML ~~LOC~~ SOLN: 0.0000 [IU] | Freq: Three times a day (TID) | SUBCUTANEOUS | Status: DC

## 2019-07-26 MED ORDER — THROMBIN 20000 UNITS EX SOLR
CUTANEOUS | Status: DC | PRN
  Administered 2019-07-26: 20 mL via TOPICAL

## 2019-07-26 MED ORDER — THROMBIN 20000 UNITS EX SOLR
CUTANEOUS | Status: AC
  Filled 2019-07-26: qty 20000

## 2019-07-26 MED ORDER — DEXAMETHASONE SODIUM PHOSPHATE 10 MG/ML IJ SOLN: INTRAMUSCULAR | Status: DC | PRN

## 2019-07-26 MED ORDER — SUGAMMADEX SODIUM 200 MG/2ML IV SOLN
INTRAVENOUS | Status: DC | PRN
  Administered 2019-07-26: 150 mg via INTRAVENOUS

## 2019-07-26 MED ORDER — FENTANYL CITRATE (PF) 250 MCG/5ML IJ SOLN
INTRAMUSCULAR | Status: AC
  Filled 2019-07-26: qty 5

## 2019-07-26 MED ORDER — METHOCARBAMOL 500 MG PO TABS
500.0000 mg | ORAL_TABLET | Freq: Four times a day (QID) | ORAL | Status: DC | PRN
  Administered 2019-07-26 – 2019-07-27 (×2): 500 mg via ORAL
  Filled 2019-07-26 (×2): qty 1

## 2019-07-26 MED ORDER — HYDROCHLOROTHIAZIDE 25 MG PO TABS
25.0000 mg | ORAL_TABLET | Freq: Every day | ORAL | Status: DC
  Administered 2019-07-27: 25 mg via ORAL
  Filled 2019-07-26: qty 1

## 2019-07-26 MED ORDER — DIPHENHYDRAMINE HCL 25 MG PO CAPS: 25.0000 mg | ORAL_CAPSULE | Freq: Four times a day (QID) | ORAL | Status: DC | PRN

## 2019-07-26 MED ORDER — ONDANSETRON HCL 4 MG/2ML IJ SOLN: 4.0000 mg | Freq: Four times a day (QID) | INTRAMUSCULAR | Status: DC | PRN

## 2019-07-26 MED ORDER — PHENYLEPHRINE HCL (PRESSORS) 10 MG/ML IV SOLN
INTRAVENOUS | Status: DC | PRN
  Administered 2019-07-26: 80 ug via INTRAVENOUS

## 2019-07-26 MED ORDER — SODIUM CHLORIDE 0.9 % IV SOLN: 250.0000 mL | INTRAVENOUS | Status: DC

## 2019-07-26 MED ORDER — LACTATED RINGERS IV SOLN
INTRAVENOUS | Status: DC
  Administered 2019-07-26 (×2): via INTRAVENOUS

## 2019-07-26 MED ORDER — FENTANYL CITRATE (PF) 250 MCG/5ML IJ SOLN
INTRAMUSCULAR | Status: DC | PRN
  Administered 2019-07-26: 50 ug via INTRAVENOUS
  Administered 2019-07-26: 25 ug via INTRAVENOUS
  Administered 2019-07-26 (×2): 50 ug via INTRAVENOUS
  Administered 2019-07-26: 25 ug via INTRAVENOUS
  Administered 2019-07-26: 50 ug via INTRAVENOUS

## 2019-07-26 MED ORDER — 0.9 % SODIUM CHLORIDE (POUR BTL) OPTIME
TOPICAL | Status: DC | PRN
  Administered 2019-07-26 (×3): 1000 mL

## 2019-07-26 MED ORDER — HYDROMORPHONE HCL 1 MG/ML IJ SOLN
INTRAMUSCULAR | Status: AC
  Filled 2019-07-26: qty 1

## 2019-07-26 MED ORDER — MIDAZOLAM HCL 2 MG/2ML IJ SOLN
INTRAMUSCULAR | Status: DC | PRN
  Administered 2019-07-26: 2 mg via INTRAVENOUS

## 2019-07-26 MED ORDER — PHENYLEPHRINE HCL-NACL 10-0.9 MG/250ML-% IV SOLN
INTRAVENOUS | Status: DC | PRN
  Administered 2019-07-26: 25 ug/min via INTRAVENOUS

## 2019-07-26 MED ORDER — BISACODYL 5 MG PO TBEC: 5.0000 mg | DELAYED_RELEASE_TABLET | Freq: Every day | ORAL | Status: DC | PRN

## 2019-07-26 MED ORDER — PROPOFOL 10 MG/ML IV BOLUS
INTRAVENOUS | Status: AC
  Filled 2019-07-26: qty 20

## 2019-07-26 MED ORDER — CEFAZOLIN SODIUM-DEXTROSE 2-4 GM/100ML-% IV SOLN
2.0000 g | INTRAVENOUS | Status: AC
  Administered 2019-07-26: 2 g via INTRAVENOUS
  Filled 2019-07-26: qty 100

## 2019-07-26 MED ORDER — MIDAZOLAM HCL 2 MG/2ML IJ SOLN
INTRAMUSCULAR | Status: AC
  Filled 2019-07-26: qty 2

## 2019-07-26 MED ORDER — ALUM & MAG HYDROXIDE-SIMETH 200-200-20 MG/5ML PO SUSP: 30.0000 mL | Freq: Four times a day (QID) | ORAL | Status: DC | PRN

## 2019-07-26 MED ORDER — ONDANSETRON HCL 4 MG PO TABS: 4.0000 mg | ORAL_TABLET | Freq: Four times a day (QID) | ORAL | Status: DC | PRN

## 2019-07-26 MED ORDER — PHENOL 1.4 % MT LIQD: 1.0000 | OROMUCOSAL | Status: DC | PRN

## 2019-07-26 MED ORDER — POTASSIUM CHLORIDE IN NACL 20-0.9 MEQ/L-% IV SOLN: INTRAVENOUS | Status: DC

## 2019-07-26 MED ORDER — METHOCARBAMOL 1000 MG/10ML IJ SOLN
500.0000 mg | Freq: Four times a day (QID) | INTRAVENOUS | Status: DC | PRN
  Filled 2019-07-26: qty 5

## 2019-07-26 MED ORDER — CEFAZOLIN SODIUM-DEXTROSE 2-4 GM/100ML-% IV SOLN
2.0000 g | Freq: Three times a day (TID) | INTRAVENOUS | Status: AC
  Administered 2019-07-26 – 2019-07-27 (×2): 2 g via INTRAVENOUS
  Filled 2019-07-26 (×2): qty 100

## 2019-07-26 MED ORDER — FLEET ENEMA 7-19 GM/118ML RE ENEM: 1.0000 | ENEMA | Freq: Once | RECTAL | Status: DC | PRN

## 2019-07-26 MED ORDER — DEXAMETHASONE SODIUM PHOSPHATE 10 MG/ML IJ SOLN
INTRAMUSCULAR | Status: DC | PRN
  Administered 2019-07-26: 5 mg via INTRAVENOUS

## 2019-07-26 MED ORDER — PROMETHAZINE HCL 25 MG/ML IJ SOLN: 6.2500 mg | INTRAMUSCULAR | Status: DC | PRN

## 2019-07-26 MED ORDER — POVIDONE-IODINE 7.5 % EX SOLN: Freq: Once | CUTANEOUS | Status: DC

## 2019-07-26 MED ORDER — LIDOCAINE 2% (20 MG/ML) 5 ML SYRINGE
INTRAMUSCULAR | Status: DC | PRN
  Administered 2019-07-26: 60 mg via INTRAVENOUS

## 2019-07-26 MED ORDER — BUPIVACAINE-EPINEPHRINE 0.25% -1:200000 IJ SOLN
INTRAMUSCULAR | Status: DC | PRN
  Administered 2019-07-26: 7 mL
  Administered 2019-07-26: 20 mL

## 2019-07-26 MED ORDER — DOCUSATE SODIUM 100 MG PO CAPS
100.0000 mg | ORAL_CAPSULE | Freq: Two times a day (BID) | ORAL | Status: DC
  Administered 2019-07-26 – 2019-07-27 (×2): 100 mg via ORAL
  Filled 2019-07-26 (×2): qty 1

## 2019-07-26 MED ORDER — PROPOFOL 10 MG/ML IV BOLUS
INTRAVENOUS | Status: DC | PRN
  Administered 2019-07-26 (×2): 100 mg via INTRAVENOUS

## 2019-07-26 MED ORDER — SODIUM CHLORIDE 0.9% FLUSH: 3.0000 mL | INTRAVENOUS | Status: DC | PRN

## 2019-07-26 MED ORDER — ACETAMINOPHEN 650 MG RE SUPP: 650.0000 mg | RECTAL | Status: DC | PRN

## 2019-07-26 MED ORDER — SENNOSIDES-DOCUSATE SODIUM 8.6-50 MG PO TABS: 1.0000 | ORAL_TABLET | Freq: Every evening | ORAL | Status: DC | PRN

## 2019-07-26 MED ORDER — METFORMIN HCL 500 MG PO TABS
500.0000 mg | ORAL_TABLET | Freq: Three times a day (TID) | ORAL | Status: DC
  Administered 2019-07-27 (×2): 500 mg via ORAL
  Filled 2019-07-26 (×2): qty 1

## 2019-07-26 MED ORDER — ACETAMINOPHEN 325 MG PO TABS: 650.0000 mg | ORAL_TABLET | ORAL | Status: DC | PRN

## 2019-07-26 MED ORDER — BUPIVACAINE LIPOSOME 1.3 % IJ SUSP
INTRAMUSCULAR | Status: DC | PRN
  Administered 2019-07-26: 20 mL

## 2019-07-26 MED ORDER — METHYLENE BLUE 0.5 % INJ SOLN
INTRAVENOUS | Status: DC | PRN
  Administered 2019-07-26: .5 mL

## 2019-07-26 MED ORDER — SODIUM CHLORIDE 0.9% FLUSH: 3.0000 mL | Freq: Two times a day (BID) | INTRAVENOUS | Status: DC

## 2019-07-26 MED ORDER — METHYLENE BLUE 0.5 % INJ SOLN
INTRAVENOUS | Status: AC
  Filled 2019-07-26: qty 10

## 2019-07-26 MED ORDER — ONDANSETRON HCL 4 MG/2ML IJ SOLN
INTRAMUSCULAR | Status: DC | PRN
  Administered 2019-07-26: 4 mg via INTRAVENOUS

## 2019-07-26 MED ORDER — ACETAMINOPHEN 500 MG PO TABS
1000.0000 mg | ORAL_TABLET | Freq: Once | ORAL | Status: AC
  Administered 2019-07-26: 1000 mg via ORAL
  Filled 2019-07-26: qty 2

## 2019-07-26 SURGICAL SUPPLY — 87 items
AGENT HMST KT MTR STRL THRMB (HEMOSTASIS)
APL SKNCLS STERI-STRIP NONHPOA (GAUZE/BANDAGES/DRESSINGS) ×1
BENZOIN TINCTURE PRP APPL 2/3 (GAUZE/BANDAGES/DRESSINGS) ×2 IMPLANT
BLADE CLIPPER SURG (BLADE) IMPLANT
BUR PRESCISION 1.7 ELITE (BURR) ×2 IMPLANT
BUR ROUND FLUTED 5 RND (BURR) ×2 IMPLANT
BUR ROUND PRECISION 4.0 (BURR) IMPLANT
BUR SABER RD CUTTING 3.0 (BURR) IMPLANT
CARTRIDGE OIL MAESTRO DRILL (MISCELLANEOUS) ×1 IMPLANT
CONT SPEC 4OZ CLIKSEAL STRL BL (MISCELLANEOUS) ×2 IMPLANT
COVER MAYO STAND STRL (DRAPES) ×4 IMPLANT
COVER SURGICAL LIGHT HANDLE (MISCELLANEOUS) ×2 IMPLANT
COVER WAND RF STERILE (DRAPES) ×2 IMPLANT
DIFFUSER DRILL AIR PNEUMATIC (MISCELLANEOUS) ×2 IMPLANT
DRAIN CHANNEL 15F RND FF W/TCR (WOUND CARE) IMPLANT
DRAPE C-ARM 42X72 X-RAY (DRAPES) ×2 IMPLANT
DRAPE C-ARMOR (DRAPES) ×1 IMPLANT
DRAPE POUCH INSTRU U-SHP 10X18 (DRAPES) ×2 IMPLANT
DRAPE SURG 17X23 STRL (DRAPES) ×8 IMPLANT
DURAPREP 26ML APPLICATOR (WOUND CARE) ×2 IMPLANT
ELECT BLADE 4.0 EZ CLEAN MEGAD (MISCELLANEOUS) ×2
ELECT CAUTERY BLADE 6.4 (BLADE) ×2 IMPLANT
ELECT REM PT RETURN 9FT ADLT (ELECTROSURGICAL) ×2
ELECTRODE BLDE 4.0 EZ CLN MEGD (MISCELLANEOUS) ×1 IMPLANT
ELECTRODE REM PT RTRN 9FT ADLT (ELECTROSURGICAL) ×1 IMPLANT
EVACUATOR SILICONE 100CC (DRAIN) IMPLANT
FEE INTRAOP MONITOR IMPULS NCS (MISCELLANEOUS) IMPLANT
FILTER STRAW FLUID ASPIR (MISCELLANEOUS) ×2 IMPLANT
GAUZE 4X4 16PLY RFD (DISPOSABLE) ×2 IMPLANT
GAUZE SPONGE 4X4 12PLY STRL (GAUZE/BANDAGES/DRESSINGS) ×2 IMPLANT
GLOVE BIO SURGEON STRL SZ7 (GLOVE) ×2 IMPLANT
GLOVE BIO SURGEON STRL SZ8 (GLOVE) ×2 IMPLANT
GLOVE BIOGEL PI IND STRL 7.0 (GLOVE) ×1 IMPLANT
GLOVE BIOGEL PI IND STRL 8 (GLOVE) ×1 IMPLANT
GLOVE BIOGEL PI INDICATOR 7.0 (GLOVE) ×1
GLOVE BIOGEL PI INDICATOR 8 (GLOVE) ×1
GOWN STRL REUS W/ TWL LRG LVL3 (GOWN DISPOSABLE) ×2 IMPLANT
GOWN STRL REUS W/ TWL XL LVL3 (GOWN DISPOSABLE) ×1 IMPLANT
GOWN STRL REUS W/TWL LRG LVL3 (GOWN DISPOSABLE) ×4
GOWN STRL REUS W/TWL XL LVL3 (GOWN DISPOSABLE) ×2
INTRAOP MONITOR FEE IMPULS NCS (MISCELLANEOUS) ×1
INTRAOP MONITOR FEE IMPULSE (MISCELLANEOUS) ×1
IV CATH 14GX2 1/4 (CATHETERS) ×2 IMPLANT
KIT BASIN OR (CUSTOM PROCEDURE TRAY) ×2 IMPLANT
KIT POSITION SURG JACKSON T1 (MISCELLANEOUS) ×2 IMPLANT
KIT TURNOVER KIT B (KITS) ×2 IMPLANT
MARKER SKIN DUAL TIP RULER LAB (MISCELLANEOUS) ×4 IMPLANT
MIX DBX 10CC 35% BONE (Bone Implant) ×1 IMPLANT
NDL 18GX1X1/2 (RX/OR ONLY) (NEEDLE) ×1 IMPLANT
NDL HYPO 25GX1X1/2 BEV (NEEDLE) ×1 IMPLANT
NDL SPNL 18GX3.5 QUINCKE PK (NEEDLE) ×2 IMPLANT
NEEDLE 18GX1X1/2 (RX/OR ONLY) (NEEDLE) ×2 IMPLANT
NEEDLE 22X1 1/2 (OR ONLY) (NEEDLE) ×4 IMPLANT
NEEDLE HYPO 25GX1X1/2 BEV (NEEDLE) ×2 IMPLANT
NEEDLE SPNL 18GX3.5 QUINCKE PK (NEEDLE) ×4 IMPLANT
NS IRRIG 1000ML POUR BTL (IV SOLUTION) ×3 IMPLANT
OIL CARTRIDGE MAESTRO DRILL (MISCELLANEOUS) ×2
PACK LAMINECTOMY ORTHO (CUSTOM PROCEDURE TRAY) ×2 IMPLANT
PACK UNIVERSAL I (CUSTOM PROCEDURE TRAY) ×2 IMPLANT
PAD ARMBOARD 7.5X6 YLW CONV (MISCELLANEOUS) ×4 IMPLANT
PATTIES SURGICAL .5 X1 (DISPOSABLE) ×2 IMPLANT
PATTIES SURGICAL .5X1.5 (GAUZE/BANDAGES/DRESSINGS) ×1 IMPLANT
PROBE PED SCREW MONOP 3 BT NCS (MISCELLANEOUS) IMPLANT
ROD PRE BENT EXPEDIUM 35MM (Rod) ×2 IMPLANT
SCREW PROBE PEDICLE MONOPOLAR (MISCELLANEOUS) ×1
SCREW SET SINGLE INNER (Screw) ×4 IMPLANT
SCREW VIPER 7MMX30MM CORTICAL (Screw) ×2 IMPLANT
SCREW VIPER CORT FIX 6.00X30 (Screw) ×2 IMPLANT
SPACER EX PROLFT 10X28X8 SM 7D (Spacer) IMPLANT
SPACER EXP PROLIFT 10X28 SM 7D (Spacer) ×2 IMPLANT
SPONGE INTESTINAL PEANUT (DISPOSABLE) ×2 IMPLANT
SPONGE SURGIFOAM ABS GEL 100 (HEMOSTASIS) ×2 IMPLANT
STRIP CLOSURE SKIN 1/2X4 (GAUZE/BANDAGES/DRESSINGS) ×3 IMPLANT
SURGIFLO W/THROMBIN 8M KIT (HEMOSTASIS) IMPLANT
SUT MNCRL AB 4-0 PS2 18 (SUTURE) ×2 IMPLANT
SUT VIC AB 0 CT1 18XCR BRD 8 (SUTURE) ×1 IMPLANT
SUT VIC AB 0 CT1 8-18 (SUTURE) ×2
SUT VIC AB 1 CT1 18XCR BRD 8 (SUTURE) ×1 IMPLANT
SUT VIC AB 1 CT1 8-18 (SUTURE) ×2
SUT VIC AB 2-0 CT2 18 VCP726D (SUTURE) ×2 IMPLANT
SYR 20ML LL LF (SYRINGE) ×4 IMPLANT
SYR BULB IRRIGATION 50ML (SYRINGE) ×2 IMPLANT
SYR CONTROL 10ML LL (SYRINGE) ×4 IMPLANT
SYR TB 1ML LUER SLIP (SYRINGE) ×2 IMPLANT
TRAY FOLEY MTR SLVR 16FR STAT (SET/KITS/TRAYS/PACK) ×2 IMPLANT
WATER STERILE IRR 1000ML POUR (IV SOLUTION) ×2 IMPLANT
YANKAUER SUCT BULB TIP NO VENT (SUCTIONS) ×2 IMPLANT

## 2019-07-26 NOTE — Anesthesia Preprocedure Evaluation (Addendum)
Anesthesia Evaluation  Patient identified by MRN, date of birth, ID band Patient awake    Reviewed: Allergy & Precautions, NPO status , Patient's Chart, lab work & pertinent test results  Airway Mallampati: I  TM Distance: >3 FB Neck ROM: Full    Dental no notable dental hx.    Pulmonary neg pulmonary ROS,    Pulmonary exam normal breath sounds clear to auscultation       Cardiovascular hypertension, Pt. on medications Normal cardiovascular exam Rhythm:Regular Rate:Normal  ECG: NSR, rate 64   Neuro/Psych  Neuromuscular disease negative psych ROS   GI/Hepatic negative GI ROS, Neg liver ROS,   Endo/Other  diabetes, Oral Hypoglycemic Agents  Renal/GU negative Renal ROS     Musculoskeletal negative musculoskeletal ROS (+)   Abdominal   Peds  Hematology HLD   Anesthesia Other Findings LUMBAR RADICULOPATHY  Reproductive/Obstetrics                            Anesthesia Physical Anesthesia Plan  ASA: II  Anesthesia Plan: General   Post-op Pain Management:    Induction: Intravenous  PONV Risk Score and Plan: 4 or greater and Midazolam, Ondansetron, Treatment may vary due to age or medical condition and Dexamethasone  Airway Management Planned: Oral ETT  Additional Equipment:   Intra-op Plan:   Post-operative Plan: Extubation in OR  Informed Consent: I have reviewed the patients History and Physical, chart, labs and discussed the procedure including the risks, benefits and alternatives for the proposed anesthesia with the patient or authorized representative who has indicated his/her understanding and acceptance.     Dental advisory given  Plan Discussed with: CRNA  Anesthesia Plan Comments:        Anesthesia Quick Evaluation

## 2019-07-26 NOTE — H&P (Signed)
PREOPERATIVE H&P  Chief Complaint: Right leg pain  HPI: Alyssa Schultz is a 53 y.o. female who presents with ongoing pain in the right leg  MRI reveals severe stenosis and instability at L5/S1  Patient has failed multiple forms of conservative care and continues to have pain (see office notes for additional details regarding the patient's full course of treatment)  Past Medical History:  Diagnosis Date  . Complication of anesthesia    hard to wake in 2017, patient reports she is 30 lb lighter now  . Diabetes mellitus    dx 2014   just take pills  . Hyperlipidemia   . Hypertension   . Neuromuscular disorder [employer])    Past Surgical History:  Procedure Laterality Date  . abalsion    . ablation    . ANTERIOR CERVICAL DECOMP/DISCECTOMY FUSION N/A 09/19/2015   Procedure: ANTERIOR CERVICAL DECOMPRESSION/DISCECTOMY FUSION 2 LEVELS;  Surgeon: Phylliss Bob, MD;  Location: [facility];  Service: Orthopedics;  Laterality: N/A;  Anterior cervical decompression fusion, cervical 3-4, cervical 4-5 decompression with instrumentation and allograft  . BACK SURGERY    . FACIAL RECONSTRUCTION SURGERY     Social History   Socioeconomic History  . Marital status: Single    Spouse name: Not on file  . Number of children: Not on file  . Years of education: Not on file  . Highest education level: Not on file  Occupational History  . Not on file  Social Needs  . Financial resource strain: Not on file  . Food insecurity    Worry: Not on file    Inability: Not on file  . Transportation needs    Medical: Not on file    Non-medical: Not on file  Tobacco Use  . Smoking status: Never Smoker  . Smokeless tobacco: Never Used  Substance and Sexual Activity  . Alcohol use: Yes    Comment: Occasional wine  . Drug use: No  . Sexual activity: Yes  Lifestyle  . Physical activity    Days per week: Not on file    Minutes per session: Not on file  . Stress: Not on file  Relationships  . Social  Herbalist on phone: Not on file    Gets together: Not on file    Attends religious service: Not on file    Active member of club or organization: Not on file    Attends meetings of clubs or organizations: Not on file    Relationship status: Not on file  Other Topics Concern  . 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 Diabetes Mother   . Diabetes Maternal Grandmother   . Hypertension Maternal Grandfather   . Breast cancer Neg Hx    Allergies  Allergen Reactions  . Influenza Vaccine Live     Per Dr - Pt is to not receive vaccine   . Influenza Vaccines   . Morphine And Related Other (See Comments)    Makes her crazy  . Prednisone     "makes me crazy"  . Hydrocodone Rash   Prior to Admission medications   Medication Sig Start Date End Date Taking? Authorizing Provider  acetaminophen (TYLENOL) 325 MG tablet Take 325 mg  by mouth every 6 (six) hours as needed (pain).   Yes [provider]  hydrochlorothiazide (HYDRODIURIL) 25 MG tablet Take 1 tablet (25 mg total) by mouth daily. 07/17/19  Yes Juline Patch, MD  lisinopril (ZESTRIL) 10 MG tablet Take 1 tablet (10 mg total) by mouth daily. Patient taking differently: Take 10 mg by mouth at bedtime.  07/17/19  Yes Juline Patch, MD  meclizine (ANTIVERT) 50 MG tablet Take 1 tablet (50 mg total) by mouth 3 (three) times daily as needed for dizziness. 01/06/15  Yes Lorin Picket, PA-C  metFORMIN (GLUCOPHAGE) 500 MG tablet TAKE 1 TABLET BY MOUTH 2 TIMES DAILY WITH A MEAL Patient taking differently: Take 500 mg by mouth 3 (three) times daily. TAKE 1 TABLET BY MOUTH 3 TIMES DAILY WITH A MEAL/ Dr Honor Junes 09/02/18  Yes Juline Patch, MD  nystatin cream (MYCOSTATIN) Apply 1 application topically 2 (two) times daily. Patient taking  differently: Apply 1 application topically 2 (two) times daily as needed for dry skin.  06/01/19  Yes Juline Patch, MD  saxagliptin HCl (ONGLYZA) 5 MG TABS tablet Take 1 tablet (5 mg total) by mouth daily. 06/23/18  Yes Juline Patch, MD  Accu-Chek FastClix Lancets MISC USE TO TEST ONCE DAILY 11/04/18   Juline Patch, MD  glucose blood (ACCU-CHEK GUIDE) test strip Accu-Chek Guide strips    [provider]  meloxicam (MOBIC) 7.5 MG tablet Take 7.5 mg by mouth 2 (two) times daily.    [provider]  TRUEPLUS LANCETS 30G MISC USE AS DIRECTED 03/06/16   Jackolyn Confer, MD     All other systems have been reviewed and were otherwise negative with the exception of those mentioned in the HPI and as above.  Physical Exam: There were no vitals filed for this visit.  There is no height or weight on file to calculate BMI.  General: Alert, no acute distress Cardiovascular: No pedal edema Respiratory: No cyanosis, no use of accessory musculature Skin: No lesions in the area of chief complaint Neurologic: Sensation intact distally Psychiatric: Patient is competent for consent with normal mood and affect Lymphatic: No axillary or cervical lymphadenopathy  MUSCULOSKELETAL: + SLR on the right  Assessment/Plan: RIGHT LUMBAR RADICULOPATHY, L5/S1 stenosis and instability Plan for Procedure(s): RIGHT-SIDED LUMBAR 5 - SACRUM 1 TRANSFORAMINAL LUMBAR INTERBODY FUSION WITH INSTRUMENTATION AND ALLOGRAFT   Norva Karvonen, MD 07/26/2019 7:49 AM

## 2019-07-26 NOTE — Transfer of Care (Signed)
Immediate Anesthesia Transfer of Care Note  Patient: Alyssa Schultz  Procedure(s) Performed: RIGHT-SIDED LUMBAR 5 - SACRUM 1 TRANSFORAMINAL LUMBAR INTERBODY FUSION WITH INSTRUMENTATION AND ALLOGRAFT (Right )  Patient Location: PACU  Anesthesia Type:General  Level of Consciousness: awake, alert , oriented and patient cooperative  Airway & Oxygen Therapy: Patient Spontanous Breathing and Patient connected to nasal cannula oxygen  Post-op Assessment: Report given to RN and Post -op Vital signs reviewed and stable  Post vital signs: Reviewed and stable  Last Vitals:  Vitals Value Taken Time  BP 103/76 07/26/19 1849  Temp    Pulse 95 07/26/19 1852  Resp 16 07/26/19 1852  SpO2 100 % 07/26/19 1852  Vitals shown include unvalidated device data.  Last Pain:  Vitals:   07/26/19 0938  TempSrc:   PainSc: 4       Patients Stated Pain Goal: 4 ([id number])  Complications: No apparent anesthesia complications

## 2019-07-26 NOTE — Op Note (Signed)
PATIENT NAME: Alyssa Schultz   MEDICAL RECORD NO.:   [id number]   DATE OF BIRTH: 07-28-1966   DATE OF PROCEDURE: 07/26/2019                               OPERATIVE REPORT   PREOPERATIVE DIAGNOSES: 1. Right-sided lumbar radiculopathy 2. L5-S1 instability 3. L5/S1 spinal stenosis   POSTOPERATIVE DIAGNOSES: 1. Right-sided lumbar radiculopathy 2. L5-S1 instability 3. L5/S1 spinal stenosis   PROCEDURES: 1. Right-sided L5-S1 transforaminal lumbar interbody fusion. 2. Left-sided L5-S1 posterolateral fusion. 3. L5/S1 decompression 4. Insertion of interbody device x 1 (Prolift expandable intervertebral spacer). 5. Placement of posterior instrumentation at L5, S1 bilaterally. 6. Use of local autograft. 7. Use of morselized allograft - DBX-mix. 8. Intraoperative use of fluoroscopy.   SURGEON:  Phylliss Bob, MD.   ASSISTANTPricilla Holm, PA-C.   ANESTHESIA:  General endotracheal anesthesia.   COMPLICATIONS:  None.   DISPOSITION:  Stable.   ESTIMATED BLOOD LOSS:   Minimal   INDICATIONS FOR SURGERY:  Briefly, Ms. Gibb is a pleasant 53 year old female who did present to me with severe and ongoing pain in the right Leg, with MRI findings as noted above. We did have an extensive discussion about surgery, and she did elect to proceed with the procedure noted above after failing appropriate nonoperative treatment measures.      OPERATIVE DETAILS:  On 07/26/2019, the patient was brought to surgery and general endotracheal anesthesia was administered.  The patient was placed prone on a well-padded flat Jackson bed with a spinal frame.  Antibiotics were given and a time-out procedure was performed. The back was prepped and draped in the usual fashion.  A midline incision was made overlying the L5-S1 intervertebral space.  The fascia was incised at the midline.  The paraspinal musculature was bluntly swept laterally.  Anatomic landmarks for the pedicles were exposed. Using  fluoroscopy, I did cannulate the L5 and S1 pedicles bilaterally, using a medial to lateral cortical trajectory technique.  On the left side, the posterolateral gutter and left facet joint at L5/S1 was decorticated and 6 x 30 mm screws were placed and a 35-mm rod was placed and distraction was applied across the rod on the left side. On the right side, the cannulated pedicle holes were filled with bone wax.  I then proceeded with the decompressive aspect of the procedure.   The L5-S1 level was noted to be very stenotic.  Of note, on the right, there was a very large prominent L5-S1 facet cyst, clearly compressing the traversing right S1 nerve.  Removing this was very meticulous, as it was rather adherent to the dura and traversing right S1 nerve.  I was however able to meticulously and safely remove the cyst, thereby decompressing the nerve.Then, with an assistant holding medial retraction of the traversing right S1 nerve, I did perform an annulotomy at the posterolateral aspect of the L5/S1 intervertebral space.  I then used a series of curettes and pituitary rongeurs to perform a thorough and complete intervertebral diskectomy.  The intervertebral space was then liberally packed with autograft as well as allograft in the form of DBX-mix, as was the intervertebral spacer.  The spacer was then tamped into position in the usual fashion.  Of note, I did use an expandable spacer.  The spacer was expanded to approximately 13 mm.  This was done under fluoroscopy. I was very pleased with the press-fit of the  spacer.  I then placed 6 x 30 mm screws on the right at L5 and S1.  A 35-mm rod was then placed and caps were placed. The distraction was then released on the contralateral left side.  All 4 caps were then locked.  The wound was copiously irrigated with a total of approximately 3 L prior to placing the bone graft.  Additional autograft and allograft were then packed into the posterolateral gutter on  the left side to help aid in the L5-S1 fusion.  The wound was explored for any undue bleeding and there was no substantial bleeding encountered.  Gel-Foam was placed over the laminectomy site.  The wound was then closed in layers using #1 Vicryl followed by 2-0 Vicryl, followed by 4-0 Monocryl.  Benzoin and Steri-Strips were applied followed by sterile dressing.  Of note, I did use triggered EMG to test the screws on the left, and there was no screw that tested below 20 mA.  There is no abnormal EMG activity noted throughout the entire surgery.   Of note, Pricilla Holm was my assistant throughout surgery, and did aid in retraction, suctioning, and closure.     Phylliss Bob, MD

## 2019-07-26 NOTE — Anesthesia Procedure Notes (Signed)
Procedure Name: Intubation Date/Time: 07/26/2019 2:22 PM Performed by: Clearnce Sorrel, CRNA Pre-anesthesia Checklist: Patient identified, Emergency Drugs available, Suction available, Patient being monitored and Timeout performed Patient Re-evaluated:Patient Re-evaluated prior to induction Oxygen Delivery Method: Circle system utilized Preoxygenation: Pre-oxygenation with 100% oxygen Induction Type: IV induction Ventilation: Mask ventilation with difficulty Laryngoscope Size: Mac and 2 Grade View: Grade I Tube type: Oral Tube size: 7.0 mm Number of attempts: 1 Airway Equipment and Method: Stylet Placement Confirmation: ETT inserted through vocal cords under direct vision,  positive ETCO2 and breath sounds checked- equal and bilateral Secured at: 22 cm Tube secured with: Tape Dental Injury: Teeth and Oropharynx as per pre-operative assessment

## 2019-07-27 LAB — GLUCOSE, CAPILLARY
Glucose-Capillary: 115 mg/dL — ABNORMAL HIGH (ref 70–99)
Glucose-Capillary: 106 mg/dL — ABNORMAL HIGH (ref 70–99)

## 2019-07-27 MED ORDER — METHOCARBAMOL 500 MG PO TABS: 500.0000 mg | ORAL_TABLET | Freq: Four times a day (QID) | ORAL | 2 refills | Status: DC | PRN

## 2019-07-27 MED ORDER — OXYCODONE-ACETAMINOPHEN 5-325 MG PO TABS: 1.0000 | ORAL_TABLET | ORAL | 0 refills | Status: DC | PRN

## 2019-07-27 MED FILL — METHOCARBAMOL 500 MG TABS: 500 | 7 days supply | Qty: 30 | Fill #0

## 2019-07-27 MED FILL — LIDOCAINE PATCH 5%: 5 | 30 days supply | Qty: 30 | Fill #1

## 2019-07-27 MED FILL — OXYCODONE-ACETAMINOPHEN 5-3: 5-325 | 3 days supply | Qty: 30 | Fill #0

## 2019-07-27 NOTE — Evaluation (Signed)
Physical Therapy Evaluation and Discharge Patient Details Name: Alyssa Schultz MRN: [id number] DOB: 08/30/1965 Today's Date: 07/27/2019   History of Present Illness  Pt is a 53 y/o female who presents s/p L5-S1 TLIF on 07/26/2019. PMH signficant for HTN, DM, ACDF 2017, unspecified neuromuscular disorder.     Clinical Impression  Patient evaluated by Physical Therapy with no further acute PT needs identified. All education has been completed and the patient has no further questions. Pt was able to demonstrate transfers and ambulation with gross modified independence and no AD. Overall pt functioning well but appears to be limited mostly by what she describes as sciatic pain radiating into RLE. Pt was educated on precautions, brace application/wearing schedule, appropriate activity progression, and car transfer. See below for any follow-up Physical Therapy or equipment needs. PT is signing off. Thank you for this referral.    Follow Up Recommendations No PT follow up;Supervision - Intermittent    Equipment Recommendations  None recommended by PT    Recommendations for Other Services       Precautions / Restrictions Precautions Precautions: Back;Fall Precaution Booklet Issued: Yes (comment) Precaution Comments: Reviewed handout in detail, and pt was cued for precautions during functional mobility.  Required Braces or Orthoses: Spinal Brace Spinal Brace: Thoracolumbosacral orthotic;Applied in sitting position Restrictions Weight Bearing Restrictions: No      Mobility  Bed Mobility Overal bed mobility: Modified Independent Bed Mobility: Rolling;Sidelying to Sit;Sit to Sidelying           General bed mobility comments: Pt was able to demonstrate proper log roll technique without difficulty.  Transfers Overall transfer level: Modified independent Equipment used: None             General transfer comment: No assist required. Pt demonstrated good posture when transitioning  from sit<>stand despite RLE sciatic pain.   Ambulation/Gait Ambulation/Gait assistance: Modified independent (Device/Increase time) Gait Distance (Feet): 400 Feet Assistive device: None Gait Pattern/deviations: Step-through pattern;Decreased stride length;Antalgic Gait velocity: Decreased Gait velocity interpretation: 1.31 - 2.62 ft/sec, indicative of limited community ambulator General Gait Details: Slow and guarded due to pain - mildly antalgic at times due to sciatic nerve pain. No unsteadiness noted.   Stairs Stairs: Yes Stairs assistance: Supervision Stair Management: One rail Right;Step to pattern;Forwards Number of Stairs: 10 General stair comments: VC's for sequencing and general safety. Able to negotiate stairs well leading with LLE to ascend and RLE to descend.   Wheelchair Mobility    Modified Rankin (Stroke Patients Only)       Balance Overall balance assessment: No apparent balance deficits (not formally assessed)                                           Pertinent Vitals/Pain Pain Assessment: Faces Faces Pain Scale: Hurts little more Pain Location: Incision site Pain Descriptors / Indicators: Operative site guarding;Burning Pain Intervention(s): Limited activity within patient's tolerance;Monitored during session;Repositioned    Home Living Family/patient expects to be discharged to:: Private residence Living Arrangements: Alone Available Help at Discharge: Friend(s);Available PRN/intermittently Type of Home: House Home Access: Level entry     Home Layout: Two level;Bed/bath upstairs        Prior Function Level of Independence: Independent               Hand Dominance        Extremity/Trunk Assessment   Upper Extremity Assessment  Upper Extremity Assessment: Defer to OT evaluation    Lower Extremity Assessment Lower Extremity Assessment: RLE deficits/detail RLE Deficits / Details: Pt favoring RLE at times due to  sciatic nerve pain     Cervical / Trunk Assessment Cervical / Trunk Assessment: Other exceptions Cervical / Trunk Exceptions: s/p surgery  Communication   Communication: No difficulties  Cognition Arousal/Alertness: Awake/alert Behavior During Therapy: WFL for tasks assessed/performed Overall Cognitive Status: Within Functional Limits for tasks assessed                                        General Comments      Exercises     Assessment/Plan    PT Assessment Patent does not need any further PT services  PT Problem List         PT Treatment Interventions      PT Goals (Current goals can be found in the Care Plan section)  Acute Rehab PT Goals PT Goal Formulation: All assessment and education complete, DC therapy    Frequency     Barriers to discharge        Co-evaluation               AM-PAC PT "6 Clicks" Mobility  Outcome Measure Help needed turning from your back to your side while in a flat bed without using bedrails?: None Help needed moving from lying on your back to sitting on the side of a flat bed without using bedrails?: None Help needed moving to and from a bed to a chair (including a wheelchair)?: None Help needed standing up from a chair using your arms (e.g., wheelchair or bedside chair)?: None Help needed to walk in hospital room?: None Help needed climbing 3-5 steps with a railing? : None 6 Click Score: 24    End of Session Equipment Utilized During Treatment: Back brace Activity Tolerance: Patient tolerated treatment well Patient left: in chair;with call bell/phone within reach Nurse Communication: Mobility status PT Visit Diagnosis: Other symptoms and signs involving the nervous system (R29.898);Pain Pain - Right/Left: Right Pain - part of body: Leg(and incision site)    Time: [id number] PT Time Calculation (min) (ACUTE ONLY): 25 min   Charges:   PT Evaluation $PT Eval Low Complexity: 1 Low PT Treatments $Gait  Training: 8-22 mins        Rolinda Roan, PT, DPT Acute Rehabilitation Services Pager: [phone] Office: [phone]   Thelma Comp 07/27/2019, 11:17 AM

## 2019-07-27 NOTE — Progress Notes (Signed)
Pt given D/C instructions with verbal understanding. Pt's incision is clean and dry with no sign of infection. Pt's IV was removed prior to D/C. Pt received RW and 3-n-1 for home use per MD order. Pt is stable @ D/C and has no other needs at this time. Holli Humbles, RN

## 2019-07-27 NOTE — Evaluation (Signed)
Occupational Therapy Evaluation Patient Details Name: Alyssa Schultz MRN: [id number] DOB: Nov 19, 1965 Today's Date: 07/27/2019    History of Present Illness Pt is a 53 y/o female who presents s/p L5-S1 TLIF on 07/26/2019. PMH signficant for HTN, DM, ACDF 2017, unspecified neuromuscular disorder.    Clinical Impression   This 53 y/o female presents with the above. PTA pt reports independence with ADL, iADL and mobility. Pt performing functional mobility without AD, seated and standing ADL overall at supervision-minguard assist level today. Pt requiring light minA for brace management during session. Educated and further reviewed back precautions, brace management, safety and compensatory techniques for completing ADL and functional transfers while maintaining precautions with pt demonstrating good carry over throughout. Pt reports will have intermittent assist from friends after discharge PRN. Questions answered throughout with no further acute OT needs identified. Feel pt is safe to return home once medically ready. Acute OT to sign off, thank you for this referral.     Follow Up Recommendations  No OT follow up;Supervision - Intermittent    Equipment Recommendations  3 in 1 bedside commode           Precautions / Restrictions Precautions Precautions: Back;Fall Precaution Booklet Issued: Yes (comment) Precaution Comments: Reviewed handout in detail, and pt was cued for precautions during functional mobility.  Required Braces or Orthoses: Spinal Brace Spinal Brace: Thoracolumbosacral orthotic;Applied in sitting position Restrictions Weight Bearing Restrictions: No      Mobility Bed Mobility Overal bed mobility: Modified Independent Bed Mobility: Rolling;Sidelying to Sit;Sit to Sidelying           General bed mobility comments: Pt standing in room upon arrival  Transfers Overall transfer level: Modified independent Equipment used: None             General transfer  comment: No assist required. Pt demonstrated good posture when transitioning from sit<>stand despite RLE sciatic pain.     Balance Overall balance assessment: No apparent balance deficits (not formally assessed)                                         ADL either performed or assessed with clinical judgement   ADL Overall ADL's : Needs assistance/impaired Eating/Feeding: Modified independent;Sitting   Grooming: Supervision/safety;Modified independent;Standing;Wash/dry face;Oral care   Upper Body Bathing: Supervision/ safety;Modified independent;Sitting   Lower Body Bathing: Min guard;Sit to/from stand   Upper Body Dressing : Minimal assistance;Sitting;Standing Upper Body Dressing Details (indicate cue type and reason): minA for TLSO management Lower Body Dressing: Min guard;Sit to/from stand Lower Body Dressing Details (indicate cue type and reason): pt able to use figure 4 technique given increased time to perform Toilet Transfer: Supervision/safety;Min guard;Ambulation   Toileting- Clothing Manipulation and Hygiene: Min guard;Sit to/from stand Toileting - Clothing Manipulation Details (indicate cue type and reason): educated in use of 3:1 over toilet to elevate for increased safety   Tub/Shower Transfer Details (indicate cue type and reason): educated in use of 3:1 as shower seat  Functional mobility during ADLs: Supervision/safety;Min guard General ADL Comments: reviewed back precautions, brace management, safety and compensatory techniques for completing ADL and functional transfers while maintaining precautions, pt with good carry over      Vision         Perception     Praxis      Pertinent Vitals/Pain Pain Assessment: Faces Faces Pain Scale: Hurts even more Pain Location: Incision site,  RLE - reports "sciatica" Pain Descriptors / Indicators: Operative site guarding;Burning Pain Intervention(s): Limited activity within patient's tolerance;Monitored  during session;Repositioned     Hand Dominance     Extremity/Trunk Assessment Upper Extremity Assessment Upper Extremity Assessment: Overall WFL for tasks assessed   Lower Extremity Assessment Lower Extremity Assessment: Defer to PT evaluation RLE Deficits / Details: Pt favoring RLE at times due to sciatic nerve pain    Cervical / Trunk Assessment Cervical / Trunk Assessment: Other exceptions Cervical / Trunk Exceptions: s/p surgery   Communication Communication Communication: No difficulties   Cognition Arousal/Alertness: Awake/alert Behavior During Therapy: WFL for tasks assessed/performed Overall Cognitive Status: Within Functional Limits for tasks assessed                                     General Comments       Exercises     Shoulder Instructions      Home Living Family/patient expects to be discharged to:: Private residence Living Arrangements: Alone Available Help at Discharge: Friend(s);Available PRN/intermittently Type of Home: House Home Access: Level entry     Home Layout: Two level;Bed/bath upstairs Alternate Level Stairs-Number of Steps: flight Alternate Level Stairs-Rails: Right Bathroom Shower/Tub: Occupational psychologist: Standard     Home Equipment: None          Prior Functioning/Environment Level of Independence: Independent                 OT Problem List: Decreased activity tolerance;Decreased knowledge of precautions;Decreased knowledge of use of DME or AE;Pain;Decreased strength      OT Treatment/Interventions:      OT Goals(Current goals can be found in the care plan section) Acute Rehab OT Goals Patient Stated Goal: regain her independence OT Goal Formulation: All assessment and education complete, DC therapy  OT Frequency:     Barriers to D/C:            Co-evaluation              AM-PAC OT "6 Clicks" Daily Activity     Outcome Measure Help from another person eating meals?:  None Help from another person taking care of personal grooming?: None Help from another person toileting, which includes using toliet, bedpan, or urinal?: None Help from another person bathing (including washing, rinsing, drying)?: A Little Help from another person to put on and taking off regular upper body clothing?: A Little Help from another person to put on and taking off regular lower body clothing?: A Little 6 Click Score: 21   End of Session Equipment Utilized During Treatment: Back brace Nurse Communication: Mobility status  Activity Tolerance: Patient tolerated treatment well Patient left: with call bell/phone within reach;Other (comment)(standing in room as pt up ad lib)  OT Visit Diagnosis: Other abnormalities of gait and mobility (R26.89);Pain Pain - Right/Left: Right Pain - part of body: Leg(back)                Time: [id number] OT Time Calculation (min): 22 min Charges:  OT General Charges $OT Visit: 1 Visit OT Evaluation $OT Eval Low Complexity: [place], OT Supplemental Rehabilitation Services Pager [phone] Office [phone]    Raymondo Band 07/27/2019, 1:39 PM

## 2019-07-27 NOTE — Progress Notes (Signed)
    Patient doing well  Patient reports resolution of right leg pain   Physical Exam: Vitals:   07/26/19 2317 07/27/19 0400  BP: (!) 95/56 (!) 90/58  Pulse: 73 71  Resp: 18 18  Temp: 97.7 F (36.5 C) 98.7 F (37.1 C)  SpO2: 100% 100%    Dressing in place NVI  POD #1 s/p L5/S1 decompression and fusion  - up with PT/OT, encourage ambulation - Percocet for pain, Robaxin for muscle spasms - d/c home today with f/u in 2 weeks

## 2019-07-28 ENCOUNTER — Other Ambulatory Visit: Payer: Self-pay | Admitting: *Deleted

## 2019-07-28 ENCOUNTER — Encounter: Payer: Self-pay | Admitting: *Deleted

## 2019-07-28 NOTE — Patient Outreach (Signed)
[facility]) Care Management  07/28/2019  RHIANNA SCHWENK April 15, 1966 [id number]   Transition of care call/case closure   Referral received: 07/24/19 Initial outreach:07/28/19 Insurance: [employer] [place] [employer]    Subjective: Initial successful telephone call to patient's preferred number in order to complete transition of care assessment; 2 HIPAA identifiers verified. Explained purpose of call and completed transition of care assessment.  States that she is hurting, she states she has taken tylenol once on today at 2 pm. , as she is awaiting delivery or her prescriptions for oxycodone and robaxin to be delivered  she uses [facility] she asked if I could help determine when medication may be delivered.  Placed call to [facility] spoke with  Estill Bamberg , explained patient situated and asked if they are able to track package for anticipated delivery. Estill Bamberg verifies that package is out for delivery and estimated time of delivery is 2:15-5:15 today.   Return call to patient to update on estimated time of delivery on today. She discussed having temperature at 100 on today but it is now down to 99. She reports surgical incision area has dressing in place, not noted redness at area when observed by family, patient understands to remove dressing on Monday.   She states tolerating diet, denies bowel or bladder problems,reinforced adequate hydration. She continues to monitor blood sugar twice daily reading today 136, report that being a little high for her she states it will come down after pain eased.   Patient mother, friend is  assisting with her recovery.  She reports tolerating mobility in the home , wearing back brace when up.   She discussed  Ongoing chronic health issues of diabetes and hypertension she states being  active with [facility] chronic disease management program .  She does have  the hospital indemnity plan and has made contact.  She  uses [facility] outpatient [facility] for delivery since [place].   She denies educational needs related to staying safe during the COVID 19 pandemic.    Objective:  Airial Imani  was hospitalized at [facility]  From 12/9-12/10/20 for  Right sided lumbar 5- Sacrum 1 transforaminal  Lumbar interbody fusion  Comorbidities include: Type 2 Diabetes ,Radiculopathy, hyperlipidemia , hypertension .  She was discharged to home on 07/28/19 without the need for home health services, with  DME fo 3 in 1 and a rolling walker.    Assessment:  Patient voices good understanding of all discharge instructions.  See transition of care flowsheet for assessment details.   Plan:  Reviewed hospital discharge diagnosis of  Right sided lumbar 5-Sacrum 1 interbody fusion   and discharge treatment plan using hospital discharge instructions, assessing medication adherence, reviewing problems requiring provider notification, and discussing the importance of follow up with surgeon, primary care provider and/or specialists as directed. Reviewed [facility]'s announcements that all [place] members will receive the Healthy Lifestyle Premium rate in 2021.  Using [facility] website, verified that patient is an active participate in 's Active Health Management chronic disease management program.  She reports next call in January 2021.  No ongoing care management needs identified so will close case to [facility] Management services and route successful outreach letter with [facility] Management pamphlet and 24 Hour Nurse Line Magnet to [facility] clinical pool to be mailed to patient's home address. Thanked patient for their services to [facility]  Health.    Joylene Draft, RN, [place] Management Coordinator  [phone]- Mobile [phone]- Toll Free Main Office

## 2019-08-01 ENCOUNTER — Encounter: Payer: Self-pay | Admitting: *Deleted

## 2019-08-02 NOTE — Discharge Summary (Signed)
Patient ID: Alyssa Schultz MRN: [id number] DOB/AGE: 01-31-1966 53 y.o.  Admit date: 07/26/2019 Discharge date: 07/27/2019  Admission Diagnoses:  Active Problems:   Radiculopathy   Discharge Diagnoses:  Same  Past Medical History:  Diagnosis Date  . Complication of anesthesia    hard to wake in 2017, patient reports she is 30 lb lighter now  . Diabetes mellitus    dx 2014   just take pills  . Hyperlipidemia   . Hypertension   . Neuromuscular disorder ([place])     Surgeries: Procedure(s): RIGHT-SIDED LUMBAR 5 - SACRUM 1 TRANSFORAMINAL LUMBAR INTERBODY FUSION WITH INSTRUMENTATION AND ALLOGRAFT on 07/26/2019   Consultants: none  Discharged Condition: Improved  Hospital Course: Alyssa Schultz is an 53 y.o. female who was admitted 07/26/2019 for operative treatment of radiculopathy. Patient has severe unremitting pain that affects sleep, daily activities, and work/hobbies. After pre-op clearance the patient was taken to the operating room on 07/26/2019 and underwent  Procedure(s): RIGHT-SIDED LUMBAR 5 - SACRUM 1 TRANSFORAMINAL LUMBAR INTERBODY FUSION WITH INSTRUMENTATION AND ALLOGRAFT.    Patient was given perioperative antibiotics:  Anti-infectives (From admission, onward)   Start     Dose/Rate Route Frequency Ordered Stop   07/26/19 2200  ceFAZolin (ANCEF) IVPB 2g/100 mL premix     2 g 200 mL/hr over 30 Minutes Intravenous Every 8 hours 07/26/19 1959 07/27/19 0432   07/26/19 0930  ceFAZolin (ANCEF) IVPB 2g/100 mL premix     2 g 200 mL/hr over 30 Minutes Intravenous On call to O.R. 07/26/19 0916 07/26/19 1426       Patient was given sequential compression devices, early ambulation to prevent DVT.  Patient benefited maximally from hospital stay and there were no complications.    Recent vital signs: BP 98/65 (BP Location: Left Arm)   Pulse 88   Temp 99.5 F (37.5 C) (Oral)   Resp 16   Ht 5' 5.5" (1.664 m)   Wt 68.5 kg   LMP 07/24/2007 Comment: ablation in 2008-pt  reports she went through menopause after this  SpO2 100%   BMI 24.75 kg/m    Discharge Medications:   Allergies as of 07/27/2019      Reactions   Influenza Vaccine Live    Per Dr - Pt is to not receive vaccine    Influenza Vaccines    Hydrocodone Rash   Morphine And Related Other (See Comments)   Makes her crazy   Prednisone    "makes me crazy"      Medication List    STOP taking these medications   acetaminophen 325 MG tablet Commonly known as: TYLENOL     TAKE these medications   Accu-Chek Guide test strip Generic drug: glucose blood Accu-Chek Guide strips   hydrochlorothiazide 25 MG tablet Commonly known as: HYDRODIURIL Take 1 tablet (25 mg total) by mouth daily.   lisinopril 10 MG tablet Commonly known as: ZESTRIL Take 1 tablet (10 mg total) by mouth daily. What changed: when to take this   meclizine 50 MG tablet Commonly known as: ANTIVERT Take 1 tablet (50 mg total) by mouth 3 (three) times daily as needed for dizziness.   metFORMIN 500 MG tablet Commonly known as: GLUCOPHAGE TAKE 1 TABLET BY MOUTH 2 TIMES DAILY WITH A MEAL What changed:   how much to take  how to take this  when to take this  additional instructions   methocarbamol 500 MG tablet Commonly known as: ROBAXIN Take 1 tablet (500 mg total)  by mouth every 6 (six) hours as needed for muscle spasms.   nystatin cream Commonly known as: MYCOSTATIN Apply 1 application topically 2 (two) times daily. What changed:   when to take this  reasons to take this   oxyCODONE-acetaminophen 5-325 MG tablet Commonly known as: PERCOCET/ROXICET Take 1-2 tablets by mouth every 4 (four) hours as needed for moderate pain or severe pain.   saxagliptin HCl 5 MG Tabs tablet Commonly known as: ONGLYZA Take 1 tablet (5 mg total) by mouth daily.   TRUEplus Lancets 30G Misc USE AS DIRECTED   Accu-Chek FastClix Lancets Misc USE TO TEST ONCE DAILY       Diagnostic Studies: DG Lumbar Spine 2-3  Views  Result Date: 07/26/2019 CLINICAL DATA:  L5-S1 instrumented fusion EXAM: DG C-ARM 1-60 MIN; LUMBAR SPINE - 2-3 VIEW COMPARISON:  Intraoperative radiograph 07/26/2019, lumbar MRI 05/19/2019 FINDINGS: Spot fluoroscopic images depict placement of a posterior fixation rods with transpedicular screws at L5 and S1 as well as placement of an L5-S1 interbody spacer. Stable grade 1 anterolisthesis of L5 on S1 is unchanged from comparison images. IMPRESSION: Intraoperative fluoroscopic images during L5-S1 fusion. Stable grade 1 anterolisthesis of L5 on S1. Electronically Signed   By: Lovena Le M.D.   On: 07/26/2019 19:00   DG Lumbar Spine 1 View  Result Date: 07/26/2019 CLINICAL DATA:  Intraoperative localization for lumbar fusion EXAM: LUMBAR SPINE - 1 VIEW COMPARISON:  MRI from 05/19/2019 FINDINGS: Initial image demonstrates a needle within the soft tissues just below the L5-S1 interspace and 1 posterior to the S2 vertebra. Degenerative changes are again seen predominately at L4-5. IMPRESSION: Intraoperative localization at L5-S1 and the S2 level posteriorly. Electronically Signed   By: Inez Catalina M.D.   On: 07/26/2019 15:15   DG C-Arm 1-60 Min  Result Date: 07/26/2019 CLINICAL DATA:  L5-S1 instrumented fusion EXAM: DG C-ARM 1-60 MIN; LUMBAR SPINE - 2-3 VIEW COMPARISON:  Intraoperative radiograph 07/26/2019, lumbar MRI 05/19/2019 FINDINGS: Spot fluoroscopic images depict placement of a posterior fixation rods with transpedicular screws at L5 and S1 as well as placement of an L5-S1 interbody spacer. Stable grade 1 anterolisthesis of L5 on S1 is unchanged from comparison images. IMPRESSION: Intraoperative fluoroscopic images during L5-S1 fusion. Stable grade 1 anterolisthesis of L5 on S1. Electronically Signed   By: Lovena Le M.D.   On: 07/26/2019 19:00    Disposition: Discharge disposition: 01-Home or Self Care        POD #1 s/p L5/S1 decompression and fusion  - up with PT/OT, encourage  ambulation - Percocet for pain, Robaxin for muscle spasms -Scripts for pain sent to pharmacy electronically  -D/C instructions sheet printed and in chart -D/C today  -F/U in office 2 weeks   Signed: Lennie Muckle [place] 08/02/2019, 1:22 PM

## 2019-08-02 NOTE — Anesthesia Postprocedure Evaluation (Signed)
Anesthesia Post Note  Patient: JUNI FASS  Procedure(s) Performed: RIGHT-SIDED LUMBAR 5 - SACRUM 1 TRANSFORAMINAL LUMBAR INTERBODY FUSION WITH INSTRUMENTATION AND ALLOGRAFT (Right )     Patient location during evaluation: PACU Anesthesia Type: General Level of consciousness: awake and alert Pain management: pain level controlled Vital Signs Assessment: post-procedure vital signs reviewed and stable Respiratory status: spontaneous breathing, nonlabored ventilation, respiratory function stable and patient connected to nasal cannula oxygen Cardiovascular status: blood pressure returned to baseline and stable Postop Assessment: no apparent nausea or vomiting Anesthetic complications: no    Last Vitals:  Vitals:   07/27/19 0746 07/27/19 1217  BP: 106/75 98/65  Pulse: 72 88  Resp: 18 16  Temp: 36.8 C 37.5 C  SpO2: 98% 100%    Last Pain:  Vitals:   07/27/19 1400  TempSrc:   PainSc: 6                  Lenox Bink

## 2019-08-15 MED FILL — DICLOFENAC SODIUM 1 % GEL: 1 | 13 days supply | Qty: 200 | Fill #0

## 2019-08-23 ENCOUNTER — Other Ambulatory Visit: Payer: Self-pay | Admitting: Family Medicine

## 2019-08-23 DIAGNOSIS — I10 Essential (primary) hypertension: Secondary | ICD-10-CM

## 2019-08-23 MED FILL — HYDROCHLOROTHIAZIDE 25 MG T: 25 | 90 days supply | Qty: 90 | Fill #0

## 2019-08-24 MED ORDER — HYDROCHLOROTHIAZIDE 25 MG PO TABS: 25.0000 mg | ORAL_TABLET | Freq: Every day | ORAL | 0 refills | Status: DC

## 2019-08-28 MED FILL — LIDOCAINE PATCH 5%: 5 | 30 days supply | Qty: 30 | Fill #0

## 2019-08-29 MED FILL — LISINOPRIL 10 MG TABS: 10 | 90 days supply | Qty: 90 | Fill #0

## 2019-08-30 DIAGNOSIS — H11421 Conjunctival edema, right eye: Secondary | ICD-10-CM | POA: Diagnosis not present

## 2019-09-04 ENCOUNTER — Emergency Department: Payer: 59

## 2019-09-04 ENCOUNTER — Other Ambulatory Visit: Payer: Self-pay

## 2019-09-04 ENCOUNTER — Encounter: Payer: Self-pay | Admitting: Emergency Medicine

## 2019-09-04 ENCOUNTER — Observation Stay
Admission: EM | Admit: 2019-09-04 | Discharge: 2019-09-05 | Disposition: A | Discharge status: Home / Self Care | Payer: 59 | Attending: Family Medicine | Admitting: Family Medicine

## 2019-09-04 DIAGNOSIS — E119 Type 2 diabetes mellitus without complications: Secondary | ICD-10-CM | POA: Insufficient documentation

## 2019-09-04 DIAGNOSIS — Z20822 Contact with and (suspected) exposure to covid-19: Secondary | ICD-10-CM | POA: Insufficient documentation

## 2019-09-04 DIAGNOSIS — Z79899 Other long term (current) drug therapy: Secondary | ICD-10-CM | POA: Insufficient documentation

## 2019-09-04 DIAGNOSIS — Z7984 Long term (current) use of oral hypoglycemic drugs: Secondary | ICD-10-CM | POA: Diagnosis not present

## 2019-09-04 DIAGNOSIS — Z9889 Other specified postprocedural states: Secondary | ICD-10-CM | POA: Diagnosis not present

## 2019-09-04 DIAGNOSIS — I10 Essential (primary) hypertension: Secondary | ICD-10-CM | POA: Insufficient documentation

## 2019-09-04 DIAGNOSIS — R200 Anesthesia of skin: Secondary | ICD-10-CM | POA: Diagnosis not present

## 2019-09-04 DIAGNOSIS — M48061 Spinal stenosis, lumbar region without neurogenic claudication: Secondary | ICD-10-CM | POA: Diagnosis not present

## 2019-09-04 DIAGNOSIS — Z981 Arthrodesis status: Secondary | ICD-10-CM | POA: Insufficient documentation

## 2019-09-04 DIAGNOSIS — R4701 Aphasia: Secondary | ICD-10-CM | POA: Diagnosis not present

## 2019-09-04 DIAGNOSIS — E785 Hyperlipidemia, unspecified: Secondary | ICD-10-CM | POA: Insufficient documentation

## 2019-09-04 DIAGNOSIS — R000 Tachycardia, unspecified: Secondary | ICD-10-CM | POA: Diagnosis not present

## 2019-09-04 DIAGNOSIS — R4702 Dysphasia: Secondary | ICD-10-CM | POA: Diagnosis not present

## 2019-09-04 DIAGNOSIS — G459 Transient cerebral ischemic attack, unspecified: Secondary | ICD-10-CM | POA: Diagnosis not present

## 2019-09-04 DIAGNOSIS — Z03818 Encounter for observation for suspected exposure to other biological agents ruled out: Secondary | ICD-10-CM | POA: Diagnosis not present

## 2019-09-04 LAB — COMPREHENSIVE METABOLIC PANEL
ALT: 12 U/L (ref 0–44)
AST: 18 U/L (ref 15–41)
Albumin: 4.5 g/dL (ref 3.5–5.0)
Alkaline Phosphatase: 54 U/L (ref 38–126)
Anion gap: 15 (ref 5–15)
BUN: 17 mg/dL (ref 6–20)
CO2: 26 mmol/L (ref 22–32)
Calcium: 9.8 mg/dL (ref 8.9–10.3)
Chloride: 98 mmol/L (ref 98–111)
Creatinine, Ser: 0.6 mg/dL (ref 0.44–1.00)
GFR calc Af Amer: >60 mL/min (ref >60)
GFR calc non Af Amer: >60 mL/min (ref >60)
Glucose, Bld: 146 mg/dL — ABNORMAL HIGH (ref 70–99)
Potassium: 3.1 mmol/L — ABNORMAL LOW (ref 3.5–5.1)
Sodium: 139 mmol/L (ref 135–145)
Total Bilirubin: 0.7 mg/dL (ref 0.3–1.2)
Total Protein: 8.3 g/dL — ABNORMAL HIGH (ref 6.5–8.1)

## 2019-09-04 LAB — DIFFERENTIAL
Abs Immature Granulocytes: 0.03 10*3/uL (ref 0.00–0.07)
Basophils Absolute: 0.1 10*3/uL (ref 0.0–0.1)
Basophils Relative: 0 %
Eosinophils Absolute: 0.1 10*3/uL (ref 0.0–0.5)
Eosinophils Relative: 1 %
Immature Granulocytes: 0 %
Lymphocytes Relative: 39 %
Lymphs Abs: 4.4 10*3/uL — ABNORMAL HIGH (ref 0.7–4.0)
Monocytes Absolute: 0.8 10*3/uL (ref 0.1–1.0)
Monocytes Relative: 7 %
Neutro Abs: 6 10*3/uL (ref 1.7–7.7)
Neutrophils Relative %: 53 %

## 2019-09-04 LAB — CBC
HCT: 39.3 % (ref 36.0–46.0)
Hemoglobin: 12.9 g/dL (ref 12.0–15.0)
MCH: 27.6 pg (ref 26.0–34.0)
MCHC: 32.8 g/dL (ref 30.0–36.0)
MCV: 84 fL (ref 80.0–100.0)
Platelets: 282 10*3/uL (ref 150–400)
RBC: 4.68 MIL/uL (ref 3.87–5.11)
RDW: 13.6 % (ref 11.5–15.5)
WBC: 11.4 10*3/uL — ABNORMAL HIGH (ref 4.0–10.5)
nRBC: 0 % (ref 0.0–0.2)

## 2019-09-04 LAB — PROTIME-INR
INR: 0.9 (ref 0.8–1.2)
Prothrombin Time: 11.7 seconds (ref 11.4–15.2)

## 2019-09-04 LAB — APTT: aPTT: 26 seconds (ref 24–36)

## 2019-09-04 LAB — GLUCOSE, CAPILLARY: Glucose-Capillary: 137 mg/dL — ABNORMAL HIGH (ref 70–99)

## 2019-09-04 NOTE — ED Triage Notes (Signed)
Pt presents to ED via [facility] with c/o numbness and sudden onset aphasia that started tonight at approx 2100. Per EMS pt has hx of DDD, C3-C5 spinal fusion, and had spinal surgery on L5 on Dec 9. Per EMS pt was laying in the bed speaking on the phone and laying with her head bent to the L side when her R arm/hand went numb and weak and patient was unable to talk. Pt states immediately jumped up and was able to run downstairs. Pt states was unable to speak for approx 5 mins, upon arrival to ED pt with full ROM, and is able to speak without difficulty at this time, no slurred speech at this time.

## 2019-09-05 ENCOUNTER — Observation Stay: Payer: 59

## 2019-09-05 ENCOUNTER — Observation Stay (HOSPITAL_BASED_OUTPATIENT_CLINIC_OR_DEPARTMENT_OTHER)
Admit: 2019-09-05 | Discharge: 2019-09-05 | Disposition: A | Discharge status: Home / Self Care | Payer: 59 | Attending: Family Medicine | Admitting: Family Medicine

## 2019-09-05 ENCOUNTER — Emergency Department: Payer: 59

## 2019-09-05 DIAGNOSIS — Z7984 Long term (current) use of oral hypoglycemic drugs: Secondary | ICD-10-CM | POA: Diagnosis not present

## 2019-09-05 DIAGNOSIS — Z20822 Contact with and (suspected) exposure to covid-19: Secondary | ICD-10-CM | POA: Diagnosis not present

## 2019-09-05 DIAGNOSIS — G459 Transient cerebral ischemic attack, unspecified: Secondary | ICD-10-CM

## 2019-09-05 DIAGNOSIS — R200 Anesthesia of skin: Secondary | ICD-10-CM | POA: Diagnosis not present

## 2019-09-05 DIAGNOSIS — E119 Type 2 diabetes mellitus without complications: Secondary | ICD-10-CM | POA: Diagnosis not present

## 2019-09-05 DIAGNOSIS — I10 Essential (primary) hypertension: Secondary | ICD-10-CM | POA: Diagnosis not present

## 2019-09-05 DIAGNOSIS — M6281 Muscle weakness (generalized): Secondary | ICD-10-CM | POA: Diagnosis not present

## 2019-09-05 DIAGNOSIS — E785 Hyperlipidemia, unspecified: Secondary | ICD-10-CM | POA: Diagnosis not present

## 2019-09-05 DIAGNOSIS — Z981 Arthrodesis status: Secondary | ICD-10-CM | POA: Diagnosis not present

## 2019-09-05 DIAGNOSIS — R4701 Aphasia: Secondary | ICD-10-CM

## 2019-09-05 DIAGNOSIS — Z79899 Other long term (current) drug therapy: Secondary | ICD-10-CM | POA: Diagnosis not present

## 2019-09-05 LAB — LIPID PANEL
Cholesterol: 202 mg/dL — ABNORMAL HIGH (ref 0–200)
HDL: 68 mg/dL (ref >40)
LDL Cholesterol: 118 mg/dL — ABNORMAL HIGH (ref 0–99)
Total CHOL/HDL Ratio: 3 RATIO
Triglycerides: 79 mg/dL (ref <150)
VLDL: 16 mg/dL (ref 0–40)

## 2019-09-05 LAB — GLUCOSE, CAPILLARY: Glucose-Capillary: 145 mg/dL — ABNORMAL HIGH (ref 70–99)

## 2019-09-05 LAB — ECHOCARDIOGRAM COMPLETE
Height: 65.5 in
Weight: 2368 oz

## 2019-09-05 LAB — SARS CORONAVIRUS 2 (TAT 6-24 HRS): SARS Coronavirus 2: NEGATIVE

## 2019-09-05 LAB — HEMOGLOBIN A1C
Hgb A1c MFr Bld: 6.1 % — ABNORMAL HIGH (ref 4.8–5.6)
Mean Plasma Glucose: 128.37 mg/dL

## 2019-09-05 LAB — MAGNESIUM: Magnesium: 2 mg/dL (ref 1.7–2.4)

## 2019-09-05 LAB — POTASSIUM: Potassium: 3.4 mmol/L — ABNORMAL LOW (ref 3.5–5.1)

## 2019-09-05 MED ORDER — ACETAMINOPHEN 160 MG/5ML PO SOLN
650.0000 mg | ORAL | Status: DC | PRN
  Filled 2019-09-05: qty 20.3

## 2019-09-05 MED ORDER — HYDROCHLOROTHIAZIDE 25 MG PO TABS
25.0000 mg | ORAL_TABLET | Freq: Every day | ORAL | Status: DC
  Administered 2019-09-05: 25 mg via ORAL
  Filled 2019-09-05: qty 1

## 2019-09-05 MED ORDER — ACETAMINOPHEN 650 MG RE SUPP: 650.0000 mg | RECTAL | Status: DC | PRN

## 2019-09-05 MED ORDER — PERFLUTREN LIPID MICROSPHERE
1.0000 mL | INTRAVENOUS | Status: AC | PRN
  Administered 2019-09-05: 2 mL via INTRAVENOUS
  Filled 2019-09-05: qty 10

## 2019-09-05 MED ORDER — LISINOPRIL 10 MG PO TABS: 10.0000 mg | ORAL_TABLET | Freq: Every day | ORAL | Status: DC

## 2019-09-05 MED ORDER — CLOPIDOGREL BISULFATE 75 MG PO TABS: 75.0000 mg | ORAL_TABLET | Freq: Every day | ORAL | Status: DC

## 2019-09-05 MED ORDER — CLOPIDOGREL BISULFATE 75 MG PO TABS: 75.0000 mg | ORAL_TABLET | Freq: Every day | ORAL | 0 refills | Status: DC

## 2019-09-05 MED ORDER — POTASSIUM CHLORIDE CRYS ER 20 MEQ PO TBCR
40.0000 meq | EXTENDED_RELEASE_TABLET | Freq: Once | ORAL | Status: AC
  Administered 2019-09-05: 40 meq via ORAL
  Filled 2019-09-05: qty 2

## 2019-09-05 MED ORDER — ACETAMINOPHEN 325 MG PO TABS
650.0000 mg | ORAL_TABLET | ORAL | Status: DC | PRN
  Administered 2019-09-05: 650 mg via ORAL
  Filled 2019-09-05: qty 2

## 2019-09-05 MED ORDER — SENNOSIDES-DOCUSATE SODIUM 8.6-50 MG PO TABS: 1.0000 | ORAL_TABLET | Freq: Every evening | ORAL | Status: DC | PRN

## 2019-09-05 MED ORDER — ENOXAPARIN SODIUM 40 MG/0.4ML ~~LOC~~ SOLN
40.0000 mg | SUBCUTANEOUS | Status: DC
  Administered 2019-09-05: 40 mg via SUBCUTANEOUS
  Filled 2019-09-05: qty 0.4

## 2019-09-05 MED ORDER — ASPIRIN EC 325 MG PO TBEC
325.0000 mg | DELAYED_RELEASE_TABLET | Freq: Every day | ORAL | Status: DC
  Administered 2019-09-05: 325 mg via ORAL
  Filled 2019-09-05: qty 1

## 2019-09-05 MED ORDER — ASPIRIN 81 MG PO CHEW
324.0000 mg | CHEWABLE_TABLET | Freq: Once | ORAL | Status: AC
  Administered 2019-09-05: 324 mg via ORAL
  Filled 2019-09-05: qty 4

## 2019-09-05 MED ORDER — ROSUVASTATIN CALCIUM 10 MG PO TABS
10.0000 mg | ORAL_TABLET | Freq: Every evening | ORAL | Status: DC
  Filled 2019-09-05: qty 1

## 2019-09-05 MED ORDER — CLOPIDOGREL BISULFATE 75 MG PO TABS
75.0000 mg | ORAL_TABLET | Freq: Every day | ORAL | Status: DC
  Administered 2019-09-05: 75 mg via ORAL
  Filled 2019-09-05: qty 1

## 2019-09-05 MED ORDER — ROSUVASTATIN CALCIUM 40 MG PO TABS: 40.0000 mg | ORAL_TABLET | Freq: Every day | ORAL | 0 refills | Status: DC

## 2019-09-05 MED ORDER — VALACYCLOVIR HCL 500 MG PO TABS: 500.0000 mg | ORAL_TABLET | Freq: Every day | ORAL | Status: DC | PRN

## 2019-09-05 MED ORDER — LINAGLIPTIN 5 MG PO TABS
5.0000 mg | ORAL_TABLET | Freq: Every day | ORAL | Status: DC
  Administered 2019-09-05: 5 mg via ORAL
  Filled 2019-09-05: qty 1

## 2019-09-05 MED ORDER — STROKE: EARLY STAGES OF RECOVERY BOOK: Freq: Once | Status: DC

## 2019-09-05 MED ORDER — ASPIRIN EC 81 MG PO TBEC: 81.0000 mg | DELAYED_RELEASE_TABLET | Freq: Every day | ORAL | Status: DC

## 2019-09-05 MED ORDER — OXYCODONE-ACETAMINOPHEN 5-325 MG PO TABS: 1.0000 | ORAL_TABLET | ORAL | Status: DC | PRN

## 2019-09-05 MED ORDER — SODIUM CHLORIDE 0.9 % IV SOLN: INTRAVENOUS | Status: DC

## 2019-09-05 MED ORDER — ASPIRIN 81 MG PO TBEC: 81.0000 mg | DELAYED_RELEASE_TABLET | Freq: Every day | ORAL | 0 refills | Status: AC

## 2019-09-05 NOTE — Discharge Instructions (Signed)
Transient Ischemic Attack  A transient ischemic attack (TIA) is a "warning stroke" that causes stroke-like symptoms that go away quickly. A TIA does not cause lasting damage to the brain. But having a TIA is a sign that you may be at risk for a stroke. Lifestyle changes and medical treatments can help prevent a stroke. It is important to know the symptoms of a TIA and what to do. Get help right away, even if your symptoms go away. The symptoms of a TIA are the same as those of a stroke. They can happen fast, and they usually go away within minutes or hours. They can include:  Weakness or loss of feeling in your face, arm, or leg. This often happens on one side of your body.  Trouble walking.  Trouble moving your arms or legs.  Trouble talking or understanding what people are saying.  Trouble seeing.  Seeing two of one object (double vision).  Feeling dizzy.  Feeling confused.  Loss of balance or coordination.  Feeling sick to your stomach (nauseous) and throwing up (vomiting).  A very bad headache for no reason. What increases the risk? Certain things may make you more likely to have a TIA. Some of these are things that you can change, such as:  Being very overweight (obese).  Using products that contain nicotine or tobacco, such as cigarettes and e-cigarettes.  Taking birth control pills.  Not being active.  Drinking too much alcohol.  Using drugs. Other risk factors include:  Having an irregular heartbeat (atrial fibrillation).  Being African American or Hispanic.  Having had blood clots, stroke, TIA, or heart attack in the past.  Being a woman with a history of high blood pressure in pregnancy (preeclampsia).  Being over the age of 60.  Being female.  Having family history of stroke.  Having the following diseases or conditions: ? High blood pressure. ? High cholesterol. ? Diabetes. ? Heart disease. ? Sickle cell disease. ? Sleep apnea. ? Migraine  headache. ? Long-term (chronic) diseases that cause soreness and swelling (inflammation). ? Disorders that affect how your blood clots. Follow these instructions at home: Medicines   Take over-the-counter and prescription medicines only as told by your doctor.  If you were told to take aspirin or another medicine to thin your blood, take it exactly as told by your doctor. ? Taking too much of the medicine can cause bleeding. ? Taking too little of the medicine may not work to treat the problem. Eating and drinking   Eat 5 or more servings of fruits and vegetables each day.  Follow instructions from your doctor about your diet. You may need to follow a certain diet to help lower your risk of having a stroke. You may need to: ? Eat a diet that is low in fat and salt. ? Eat foods that contain a lot of fiber. ? Limit the amount of carbohydrates and sugar in your diet.  Limit alcohol intake to 1 drink a day for nonpregnant women and 2 drinks a day for men. One drink equals 12 oz of beer, 5 oz of wine, or 1 oz of hard liquor. General instructions  Keep a healthy weight.  Stay active. Try to get at least 30 minutes of activity on all or most days.  Find out if you have a condition called sleep apnea. Get treatment if needed.  Do not use any products that contain nicotine or tobacco, such as cigarettes and e-cigarettes. If you need help quitting,   ask your doctor.  Do not abuse drugs.  Keep all follow-up visits as told by your doctor. This is important. Get help right away if:  You have any signs of stroke. "BE FAST" is an easy way to remember the main warning signs: ? B - Balance. Signs are dizziness, sudden trouble walking, or loss of balance. ? E - Eyes. Signs are trouble seeing or a sudden change in how you see. ? F - Face. Signs are sudden weakness or loss of feeling of the face, or the face or eyelid drooping on one side. ? A - Arms. Signs are weakness or loss of feeling in an  arm. This happens suddenly and usually on one side of the body. ? S - Speech. Signs are sudden trouble speaking, slurred speech, or trouble understanding what people say. ? T - Time. Time to call emergency services. Write down what time symptoms started.  You have other signs of stroke, such as: ? A sudden, very bad headache with no known cause. ? Feeling sick to your stomach (nausea). ? Throwing up (vomiting). ? Jerky movements that you cannot control (seizure). These symptoms may be an emergency. Do not wait to see if the symptoms will go away. Get medical help right away. Call your local emergency services (911 in the [place] Do not drive yourself to the hospital. Summary  A transient ischemic attack (TIA) is a "warning stroke" that causes stroke-like symptoms that go away quickly.  A TIA is a medical emergency. Get help right away, even if your symptoms go away.  A TIA does not cause lasting damage to the brain.  Having a TIA is a sign that you may be at risk for a stroke. Lifestyle changes and medical treatments can help prevent a stroke. This information is not intended to replace advice given to you by your health care provider. Make sure you discuss any questions you have with your health care provider. Document Revised: 04/29/2018 Document Reviewed: 11/04/2016 Elsevier Patient Education  2020 [employer]  

## 2019-09-05 NOTE — ED Notes (Signed)
MD Mansy notified of pt passing stroke swallow screen at this time

## 2019-09-05 NOTE — ED Provider Notes (Signed)
[facility] Emergency Department Provider Note  ____________________________________________  Time seen: Approximately 12:58 AM  I have reviewed the triage vital signs and the nursing notes.   HISTORY  Chief Complaint Numbness   HPI Alyssa Schultz is a 54 y.o. female with a history of hypertension, hyperlipidemia, diabetes who presents for evaluation of TIA symptoms.  Patient reports that she was walking back to her bedroom after going to the bathroom when she developed right upper extremity weakness and numbness and was unable to speak.  She reports that her symptoms lasted 3 minutes and resolve without intervention.  She denies any prior history of stroke or family history.  She denies any history of smoking.  She denies headache or chest pain.  She feels back to baseline.  The symptoms happen at 9 PM.   Past Medical History:  Diagnosis Date  . Complication of anesthesia    hard to wake in 2017, patient reports she is 30 lb lighter now  . Diabetes mellitus    dx 2014   just take pills  . Hyperlipidemia   . Hypertension   . Neuromuscular disorder [facility])     Patient Active Problem List   Diagnosis Date Noted  . Radiculopathy 07/26/2019  . Leg pain 02/25/2018  . Edema 02/25/2018  . Hyperlipidemia 12/10/2017  . Screening for breast cancer 06/03/2017  . Screen for colon cancer 06/03/2017  . Bronchitis 12/03/2016  . Left hip pain 06/04/2016  . Routine general medical examination at a health care facility 03/04/2016  . 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 Allergic rhinitis 10/14/2012  . Hypertension 11/09/2011  . Rash 10/01/2011  . Diabetes mellitus type 2, controlled ([place]) 05/21/2011  . BMI 30.0-30.9,adult 05/21/2011    Past Surgical History:  Procedure Laterality Date  . abalsion    . ablation    . ANTERIOR CERVICAL DECOMP/DISCECTOMY FUSION N/A 09/19/2015   Procedure: ANTERIOR CERVICAL  DECOMPRESSION/DISCECTOMY FUSION 2 LEVELS;  Surgeon: Phylliss Bob, MD;  Location: [place];  Service: Orthopedics;  Laterality: N/A;  Anterior cervical decompression fusion, cervical 3-4, cervical 4-5 decompression with instrumentation and allograft  . BACK SURGERY    . FACIAL RECONSTRUCTION SURGERY    . TRANSFORAMINAL LUMBAR INTERBODY FUSION (TLIF) WITH PEDICLE SCREW FIXATION 2 LEVEL Right 07/26/2019   Procedure: RIGHT-SIDED LUMBAR 5 - SACRUM 1 TRANSFORAMINAL LUMBAR INTERBODY FUSION WITH INSTRUMENTATION AND ALLOGRAFT;  Surgeon: Phylliss Bob, MD;  Location: [place];  Service: Orthopedics;  Laterality: Right;    Prior to Admission medications   Medication Sig Start Date End Date Taking? Authorizing Provider  Accu-Chek FastClix Lancets MISC USE TO TEST ONCE DAILY 11/04/18   Otilio Miu C, MD  glucose blood (ACCU-CHEK GUIDE) test strip Accu-Chek Guide strips    [provider]  hydrochlorothiazide (HYDRODIURIL) 25 MG tablet Take 1 tablet (25 mg total) by mouth daily. 08/24/19   Juline Patch, MD  lisinopril (ZESTRIL) 10 MG tablet Take 1 tablet (10 mg total) by mouth daily. Patient taking differently: Take 10 mg by mouth at bedtime.  07/17/19   Juline Patch, MD  meclizine (ANTIVERT) 50 MG tablet Take 1 tablet (50 mg total) by mouth 3 (three) times daily as needed for dizziness. 01/06/15   Lorin Picket, PA-C  metFORMIN (GLUCOPHAGE) 500 MG tablet TAKE 1 TABLET BY MOUTH 2 TIMES DAILY WITH A MEAL Patient taking differently: Take 500 mg by mouth 3 (three) times daily. TAKE 1 TABLET BY MOUTH 3 TIMES  DAILY WITH A MEAL/ Dr Honor Junes 09/02/18   Juline Patch, MD  methocarbamol (ROBAXIN) 500 MG tablet Take 1 tablet (500 mg total) by mouth every 6 (six) hours as needed for muscle spasms. 07/27/19   Phylliss Bob, MD  nystatin cream (MYCOSTATIN) Apply 1 application topically 2 (two) times daily. Patient taking differently: Apply 1 application topically 2 (two) times daily as needed for dry skin.   06/01/19   Juline Patch, MD  oxyCODONE-acetaminophen (PERCOCET/ROXICET) 5-325 MG tablet Take 1-2 tablets by mouth every 4 (four) hours as needed for moderate pain or severe pain. 07/27/19   Phylliss Bob, MD  saxagliptin HCl (ONGLYZA) 5 MG TABS tablet Take 1 tablet (5 mg total) by mouth daily. 06/23/18   Juline Patch, MD  TRUEPLUS LANCETS 30G MISC USE AS DIRECTED 03/06/16   Jackolyn Confer, MD    Allergies Influenza vaccine live, Influenza vaccines, Hydrocodone, Morphine and related, and Prednisone  Family History  Problem Relation Age of Onset  . Diabetes Mother   . Diabetes Maternal Grandmother   . Hypertension Maternal Grandfather   . Breast cancer Neg Hx     Social History Social History   Tobacco Use  . Smoking status: Never Smoker  . Smokeless tobacco: Never Used  Substance Use Topics  . Alcohol use: Yes    Comment: Occasional wine  . Drug use: No    Review of Systems  Constitutional: Negative for fever. Eyes: Negative for visual changes. ENT: Negative for sore throat. Neck: No neck pain  Cardiovascular: Negative for chest pain. Respiratory: Negative for shortness of breath. Gastrointestinal: Negative for abdominal pain, vomiting or diarrhea. Genitourinary: Negative for dysuria. Musculoskeletal: Negative for back pain. Skin: Negative for rash. Neurological: Negative for headaches. + aphasia and RUE weakness and numbness Psych: No SI or HI  ____________________________________________   PHYSICAL EXAM:  VITAL SIGNS: ED Triage Vitals [09/04/19 2150]  Enc Vitals Group     BP 123/78     Pulse Rate (!) 105     Resp 20     Temp 98.8 F (37.1 C)     Temp Source Oral     SpO2 100 %     Weight 148 lb (67.1 kg)     Height 5' 5.5" (1.664 m)     Head Circumference      Peak Flow      Pain Score 0     Pain Loc      Pain Edu?      Excl. in [place]?     Constitutional: Alert and oriented. Well appearing and in no apparent distress. HEENT:      Head:  Normocephalic and atraumatic.         Eyes: Conjunctivae are normal. Sclera is non-icteric.       Mouth/Throat: Mucous membranes are moist.       Neck: Supple with no signs of meningismus. Cardiovascular: Regular rate and rhythm. No murmurs, gallops, or rubs. 2+ symmetrical distal pulses are present in all extremities. No JVD. Respiratory: Normal respiratory effort. Lungs are clear to auscultation bilaterally. No wheezes, crackles, or rhonchi.  Gastrointestinal: Soft, non tender, and non distended with positive bowel sounds. No rebound or guarding. Musculoskeletal: Nontender with normal range of motion in all extremities. No edema, cyanosis, or erythema of extremities. Neurologic: Normal speech and language. Face is symmetric. EOMI, PERRL.  Strength and sensation intact x4, no dysmetria or pronator drift, normal gait. Skin: Skin is warm, dry and intact. No rash noted.  Psychiatric: Mood and affect are normal. Speech and behavior are normal.  ____________________________________________   LABS (all labs ordered are listed, but only abnormal results are displayed)  Labs Reviewed  CBC - Abnormal; Notable for the following components:      Result Value   WBC 11.4 (*)    All other components within normal limits  DIFFERENTIAL - Abnormal; Notable for the following components:   Lymphs Abs 4.4 (*)    All other components within normal limits  COMPREHENSIVE METABOLIC PANEL - Abnormal; Notable for the following components:   Potassium 3.1 (*)    Glucose, Bld 146 (*)    Total Protein 8.3 (*)    All other components within normal limits  GLUCOSE, CAPILLARY - Abnormal; Notable for the following components:   Glucose-Capillary 137 (*)    All other components within normal limits  SARS CORONAVIRUS 2 (TAT 6-24 HRS)  PROTIME-INR  APTT  URINE DRUG SCREEN, QUALITATIVE ([facility] ONLY)  CBG MONITORING, ED   ____________________________________________  EKG  ED ECG REPORT I, Rudene Re, the  attending physician, personally viewed and interpreted this ECG.  Sinus tachycardia, rate of 116, normal intervals, normal axis, inverted T waves in inferior leads which are new when compared to prior, no ST elevations. ____________________________________________  RADIOLOGY  I have personally reviewed the images performed during this visit and I agree with the Radiologist's read.   Interpretation by Radiologist:  CT HEAD WO CONTRAST  Result Date: 09/04/2019 CLINICAL DATA:  Sudden onset aphasia and numbness EXAM: CT HEAD WITHOUT CONTRAST TECHNIQUE: Contiguous axial images were obtained from the base of the skull through the vertex without intravenous contrast. COMPARISON:  None. FINDINGS: Brain: There is no mass, hemorrhage or extra-axial collection. The size and configuration of the ventricles and extra-axial CSF spaces are normal. The brain parenchyma is normal, without acute or chronic infarction. Vascular: No abnormal hyperdensity of the major intracranial arteries or dural venous sinuses. No intracranial atherosclerosis. Skull: The visualized skull base, calvarium and extracranial soft tissues are normal. Sinuses/Orbits: No fluid levels or advanced mucosal thickening of the visualized paranasal sinuses. No mastoid or middle ear effusion. The orbits are normal. IMPRESSION: Normal head CT. Electronically Signed   By: Ulyses Jarred M.D.   On: 09/04/2019 22:20     ____________________________________________   PROCEDURES  Procedure(s) performed: None Procedures Critical Care performed:  None ____________________________________________   INITIAL IMPRESSION / ASSESSMENT AND PLAN / ED COURSE  54 y.o. female with a history of hypertension, hyperlipidemia, diabetes who presents for evaluation of TIA symptoms. NIHSS at arrival of 0. Patient feels back to baseline. EKG with no evidence of dysrhythmias.  Head CT negative.  Blood glucose normal.  Presentation concerning for TIA.  MRI is  pending.  Patient given full dose of aspirin.  Patient passed swallow eval at bedside.  Discussed with Dr. Sidney Ace for admission.      As part of my medical decision making, I reviewed the following data within the [place] notes reviewed and incorporated, Labs reviewed , EKG interpreted , Old EKG reviewed, Old chart reviewed, Radiograph reviewed , Discussed with admitting physician , Notes from prior ED visits and [place] Controlled Substance Database   Please note:  Patient was evaluated in Emergency Department today for the symptoms described in the history of present illness. Patient was evaluated in the context of the global COVID-19 pandemic, which necessitated consideration that the patient might be at risk for infection with the SARS-CoV-2 virus that causes  COVID-19. Institutional protocols and algorithms that pertain to the evaluation of patients at risk for COVID-19 are in a state of rapid change based on information released by regulatory bodies including the CDC and federal and state organizations. These policies and algorithms were followed during the patient's care in the ED.  Some ED evaluations and interventions may be delayed as a result of limited staffing during the pandemic.   ____________________________________________   FINAL CLINICAL IMPRESSION(S) / ED DIAGNOSES   Final diagnoses:  TIA (transient ischemic attack)      NEW MEDICATIONS STARTED DURING THIS VISIT:  ED Discharge Orders    None       Note:  This document was prepared using Dragon voice recognition software and may include unintentional dictation errors.    Alfred Levins, [place], MD 09/05/19 [phone]

## 2019-09-05 NOTE — Progress Notes (Signed)
SLP Cancellation Note  Patient Details Name: Alyssa Schultz MRN: [id number] DOB: 30-Jan-1966   Cancelled treatment:       Reason Eval/Treat Not Completed: SLP screened, no needs identified, will sign off(chart reviewed; consulted NSG then met w/ pt in room). Pt denied any difficulty swallowing and is currently on a regular diet; tolerates swallowing pills w/ water per NSG. Pt conversed at conversational level w/out overt deficits noted; pt and NSG denied any speech-language deficits.  No further skilled ST services indicated as pt appears at her baseline. Pt agreed. NSG to reconsult if any change in status.     Orinda Kenner, MS, CCC-SLP Toshie Demelo 09/05/2019, 10:35 AM

## 2019-09-05 NOTE — Consult Note (Signed)
Referring Physician: Izetta Dakin    Chief Complaint: Difficulty with speech and RUE numbness  HPI: Alyssa Schultz is an 54 y.o. female with a known history of HLD, hypertension and type II DM, who presented to the emergency room after onset of difficulty with speech and RUE numbness.  Patient was talking on the phone and had acute onset of difficulty getting her words out.  Then noted RUE numbness.  Symptoms resolved by the time of presentation.  Patient now at baseline.  Initial NIHSS of 0.  Date last known well: 09/04/2019  Time last known well: Time: 21:00 tPA Given: No: Resolution of symptoms  Past Medical History:  Diagnosis Date  . Complication of anesthesia    hard to wake in 2017, patient reports she is 30 lb lighter now  . Diabetes mellitus    dx 2014   just take pills  . Hyperlipidemia   . Hypertension   . Neuromuscular disorder [facility])     Past Surgical History:  Procedure Laterality Date  . abalsion    . ablation    . ANTERIOR CERVICAL DECOMP/DISCECTOMY FUSION N/A 09/19/2015   Procedure: ANTERIOR CERVICAL DECOMPRESSION/DISCECTOMY FUSION 2 LEVELS;  Surgeon: Phylliss Bob, MD;  Location: [place];  Service: Orthopedics;  Laterality: N/A;  Anterior cervical decompression fusion, cervical 3-4, cervical 4-5 decompression with instrumentation and allograft  . BACK SURGERY    . FACIAL RECONSTRUCTION SURGERY    . TRANSFORAMINAL LUMBAR INTERBODY FUSION (TLIF) WITH PEDICLE SCREW FIXATION 2 LEVEL Right 07/26/2019   Procedure: RIGHT-SIDED LUMBAR 5 - SACRUM 1 TRANSFORAMINAL LUMBAR INTERBODY FUSION WITH INSTRUMENTATION AND ALLOGRAFT;  Surgeon: Phylliss Bob, MD;  Location: [place];  Service: Orthopedics;  Laterality: Right;    Family History  Problem Relation Age of Onset  . Diabetes Mother   . Diabetes Maternal Grandmother   . Hypertension Maternal Grandfather   . Breast cancer Neg Hx    Social History:  reports that she has never smoked. She has never used smokeless tobacco. She reports  current alcohol use. She reports that she does not use drugs.  Allergies:  Allergies  Allergen Reactions  . Influenza Vaccine Live     Per Dr - Pt is to not receive vaccine   . Influenza Vaccines   . Hydrocodone Rash  . Morphine And Related Other (See Comments)    Makes her crazy  . Prednisone     "makes me crazy"    Medications:  I have reviewed the patient's current medications. Prior to Admission:  Prior to Admission medications   Medication Sig Start Date End Date Taking? Authorizing Provider  Accu-Chek FastClix Lancets MISC USE TO TEST ONCE DAILY 11/04/18  Yes Otilio Miu C, MD  glucose blood (ACCU-CHEK GUIDE) test strip Accu-Chek Guide strips   Yes [provider]  hydrochlorothiazide (HYDRODIURIL) 25 MG tablet Take 1 tablet (25 mg total) by mouth daily. 08/24/19  Yes Juline Patch, MD  lisinopril (ZESTRIL) 10 MG tablet Take 1 tablet (10 mg total) by mouth daily. Patient taking differently: Take 10 mg by mouth at bedtime.  07/17/19  Yes Juline Patch, MD  metFORMIN (GLUCOPHAGE) 500 MG tablet TAKE 1 TABLET BY MOUTH 2 TIMES DAILY WITH A MEAL Patient taking differently: Take 500 mg by mouth 3 (three) times daily. TAKE 1 TABLET BY MOUTH 3 TIMES DAILY WITH A MEAL/ Dr Honor Junes 09/02/18  Yes Juline Patch, MD  methocarbamol (ROBAXIN) 500 MG tablet Take 1 tablet (500 mg total) by mouth every 6 (  six) hours as needed for muscle spasms. 07/27/19  Yes Phylliss Bob, MD  oxyCODONE-acetaminophen (PERCOCET/ROXICET) 5-325 MG tablet Take 1-2 tablets by mouth every 4 (four) hours as needed for moderate pain or severe pain. 07/27/19  Yes Phylliss Bob, MD  rosuvastatin (CRESTOR) 10 MG tablet Take 1 tablet by mouth every evening. 06/09/19  Yes [provider]  saxagliptin HCl (ONGLYZA) 5 MG TABS tablet Take 1 tablet (5 mg total) by mouth daily. 06/23/18  Yes Juline Patch, MD  TRUEPLUS LANCETS 30G MISC USE AS DIRECTED 03/06/16  Yes Jackolyn Confer, MD  valACYclovir  (VALTREX) 500 MG tablet Take 500 mg by mouth as needed.   Yes [provider]  meclizine (ANTIVERT) 50 MG tablet Take 1 tablet (50 mg total) by mouth 3 (three) times daily as needed for dizziness. Patient not taking: Reported on 09/05/2019 01/06/15   Crecencio Mc P, PA-C  nystatin cream (MYCOSTATIN) Apply 1 application topically 2 (two) times daily. Patient not taking: Reported on 09/05/2019 06/01/19   Juline Patch, MD     Scheduled: .  stroke: mapping our early stages of recovery book   Does not apply Once  . aspirin EC  325 mg Oral Daily  . enoxaparin (LOVENOX) injection  40 mg Subcutaneous Q24H  . hydrochlorothiazide  25 mg Oral Daily  . linagliptin  5 mg Oral Daily  . lisinopril  10 mg Oral QHS  . rosuvastatin  10 mg Oral QPM    ROS: History obtained from the patient  General ROS: negative for - chills, fatigue, fever, night sweats, weight gain or weight loss Psychological ROS: negative for - behavioral disorder, hallucinations, memory difficulties, mood swings or suicidal ideation Ophthalmic ROS: negative for - blurry vision, double vision, eye pain or loss of vision ENT ROS: negative for - epistaxis, nasal discharge, oral lesions, sore throat, tinnitus or vertigo Allergy and Immunology ROS: negative for - hives or itchy/watery eyes Hematological and Lymphatic ROS: negative for - bleeding problems, bruising or swollen lymph nodes Endocrine ROS: negative for - galactorrhea, hair pattern changes, polydipsia/polyuria or temperature intolerance Respiratory ROS: negative for - cough, hemoptysis, shortness of breath or wheezing Cardiovascular ROS: negative for - chest pain, dyspnea on exertion, edema or irregular heartbeat Gastrointestinal ROS: negative for - abdominal pain, diarrhea, hematemesis, nausea/vomiting or stool incontinence Genito-Urinary ROS: negative for - dysuria, hematuria, incontinence or urinary frequency/urgency Musculoskeletal ROS: right leg  pain Neurological ROS: as noted in HPI Dermatological ROS: negative for rash and skin lesion changes  Physical Examination: Blood pressure 103/74, pulse 83, temperature 98.8 F (37.1 C), temperature source Oral, resp. rate 14, height 5' 5.5" (1.664 m), weight 67.1 kg, last menstrual period 07/24/2007, SpO2 98 %.  HEENT-  Normocephalic, no lesions, without obvious abnormality.  Normal external eye and conjunctiva.  Normal TM's bilaterally.  Normal auditory canals and external ears. Normal external nose, mucus membranes and septum.  Normal pharynx. Cardiovascular- S1, S2 normal, pulses palpable throughout   Lungs- chest clear, no wheezing, rales, normal symmetric air entry Abdomen- soft, non-tender; bowel sounds normal; no masses,  no organomegaly Extremities- no edema Lymph-no adenopathy palpable Musculoskeletal-no joint tenderness, deformity or swelling Skin-warm and dry, no hyperpigmentation, vitiligo, or suspicious lesions  Neurological Examination   Mental Status: Alert, oriented, thought content appropriate.  Speech fluent without evidence of aphasia.  Able to follow 3 step commands without difficulty. Cranial Nerves: II: Discs flat bilaterally; Visual fields grossly normal, pupils equal, round, reactive to light and accommodation III,IV, VI:  ptosis not present, extra-ocular motions intact bilaterally V,VII: smile symmetric, facial light touch sensation normal bilaterally VIII: hearing normal bilaterally IX,X: gag reflex present XI: bilateral shoulder shrug XII: midline tongue extension Motor: Right : Upper extremity   5/5    Left:     Upper extremity   5/5  Lower extremity   5/5     Lower extremity   5/5 Tone and bulk:normal tone throughout; no atrophy noted Sensory: Pinprick and light touch intact throughout, bilaterally Deep Tendon Reflexes: Symmetric throughout Plantars: Right: mute   Left: mute Cerebellar: Normal finger-to-nose and normal heel-to-shin testing  bilaterally Gait: not tested due to safety concerns    Laboratory Studies:  Basic Metabolic Panel: Recent Labs  Lab 09/04/19 November 29, 2151  NA 139  K 3.1*  CL 98  CO2 26  GLUCOSE 146*  BUN 17  CREATININE 0.60  CALCIUM 9.8    Liver Function Tests: Recent Labs  Lab 09/04/19 11-29-2151  AST 18  ALT 12  ALKPHOS 54  BILITOT 0.7  PROT 8.3*  ALBUMIN 4.5   No results for input(s): LIPASE, AMYLASE in the last 168 hours. No results for input(s): AMMONIA in the last 168 hours.  CBC: Recent Labs  Lab 09/04/19 2153  WBC 11.4*  NEUTROABS 6.0  HGB 12.9  HCT 39.3  MCV 84.0  PLT 282    Cardiac Enzymes: No results for input(s): CKTOTAL, CKMB, CKMBINDEX, TROPONINI in the last 168 hours.  BNP: Invalid input(s): POCBNP  CBG: Recent Labs  Lab 09/04/19 2328 09/05/19 0202  GLUCAP 137* 145*    Microbiology: Results for orders placed or performed during the hospital encounter of 09/04/19  SARS CORONAVIRUS 2 (TAT 6-24 HRS) Nasopharyngeal Nasopharyngeal Swab     Status: None   Collection Time: 09/05/19 12:27 AM   Specimen: Nasopharyngeal Swab  Result Value Ref Range Status   SARS Coronavirus 2 NEGATIVE NEGATIVE Final    Comment: (NOTE) SARS-CoV-2 target nucleic acids are NOT DETECTED. The SARS-CoV-2 RNA is generally detectable in upper and lower respiratory specimens during the acute phase of infection. Negative results do not preclude SARS-CoV-2 infection, do not rule out co-infections with other pathogens, and should not be used as the sole basis for treatment or other patient management decisions. Negative results must be combined with clinical observations, patient history, and epidemiological information. The expected result is Negative. Fact Sheet for Patients: SugarRoll.be Fact Sheet for Healthcare Providers: https://www.woods-mathews.com/ This test is not yet approved or cleared by the [place] FDA and  has been authorized for  detection and/or diagnosis of SARS-CoV-2 by FDA under an Emergency Use Authorization (EUA). This EUA will remain  in effect (meaning this test can be used) for the duration of the COVID-19 declaration under Section 56 4(b)(1) of the Act, 21 U.S.C. section 360bbb-3(b)(1), unless the authorization is terminated or revoked sooner. Performed at [facility], [place] [address], [place], [place] [address]     Coagulation Studies: Recent Labs    09/04/19 11/29/2151  LABPROT 11.7  INR 0.9    Urinalysis: No results for input(s): COLORURINE, LABSPEC, PHURINE, GLUCOSEU, HGBUR, BILIRUBINUR, KETONESUR, PROTEINUR, UROBILINOGEN, NITRITE, LEUKOCYTESUR in the last 168 hours.  Invalid input(s): APPERANCEUR  Lipid Panel:    Component Value Date/Time   CHOL 202 (H) 09/05/2019 0511   TRIG 79 09/05/2019 0511   HDL 68 09/05/2019 0511   CHOLHDL 3.0 09/05/2019 0511   VLDL 16 09/05/2019 0511   LDLCALC 118 (H) 09/05/2019 0511    HgbA1C:  Lab  Results  Component Value Date   HGBA1C 6.1 (H) 09/05/2019    Urine Drug Screen:  No results found for: LABOPIA, COCAINSCRNUR, LABBENZ, AMPHETMU, THCU, LABBARB  Alcohol Level: No results for input(s): ETH in the last 168 hours.  Other results: EKG: sinus tachycardia at 116 bpm.  Imaging: CT HEAD WO CONTRAST  Result Date: 09/04/2019 CLINICAL DATA:  Sudden onset aphasia and numbness EXAM: CT HEAD WITHOUT CONTRAST TECHNIQUE: Contiguous axial images were obtained from the base of the skull through the vertex without intravenous contrast. COMPARISON:  None. FINDINGS: Brain: There is no mass, hemorrhage or extra-axial collection. The size and configuration of the ventricles and extra-axial CSF spaces are normal. The brain parenchyma is normal, without acute or chronic infarction. Vascular: No abnormal hyperdensity of the major intracranial arteries or dural venous sinuses. No intracranial atherosclerosis. Skull: The visualized skull base, calvarium and extracranial  soft tissues are normal. Sinuses/Orbits: No fluid levels or advanced mucosal thickening of the visualized paranasal sinuses. No mastoid or middle ear effusion. The orbits are normal. IMPRESSION: Normal head CT. Electronically Signed   By: Ulyses Jarred M.D.   On: 09/04/2019 22:20   MR BRAIN WO CONTRAST  Result Date: 09/05/2019 CLINICAL DATA:  Transient ischemic attack. Right upper extremity weakness. EXAM: MRI HEAD WITHOUT CONTRAST TECHNIQUE: Multiplanar, multiecho pulse sequences of the brain and surrounding structures were obtained without intravenous contrast. COMPARISON:  Head CT 09/04/2019 FINDINGS: BRAIN: There is no acute infarct, acute hemorrhage or extra-axial collection. The white matter signal is normal for the patient's age. The cerebral and cerebellar volume are age-appropriate. There is no hydrocephalus. The midline structures are normal. VASCULAR: The major intracranial arterial and venous sinus flow voids are normal. Susceptibility-sensitive sequences show no chronic microhemorrhage or superficial siderosis. SKULL AND UPPER CERVICAL SPINE: Calvarial bone marrow signal is normal. There is no skull base mass. The visualized upper cervical spine and soft tissues are normal. SINUSES/ORBITS: There are no fluid levels or advanced mucosal thickening. The mastoid air cells and middle ear cavities are free of fluid. The orbits are normal. IMPRESSION: Normal MRI of the brain. Electronically Signed   By: Ulyses Jarred M.D.   On: 09/05/2019 01:52    Assessment: 54 y.o. female with a history of HTN, HLD and DM who presents after an episode of difficulty with speech and RUE numbness.  Patient now at baseline.  MRI of the brain reviewed and shows no acute changes.  TIA likely etiology for presentation.  Patient on no antiplatelet therapy prior to presentation.  Carotid dopplers and echocardiogram are pending.  A1c 6.1.  LDL 118.  BP controlled.    Stroke Risk Factors - diabetes mellitus, hyperlipidemia  and hypertension  Plan: 1. PT consult, OT consult, Speech evaluations 2. Echocardiogram pending 3. Carotid dopplers pending 4. Prophylactic therapy-Dual antiplatelet therapy with ASA 81mg  and Plavix 75mg  for three weeks with change to ASA 81mg  daily alone as monotherapy after that time. 5. Telemetry monitoring 6. Frequent neuro checks 7. Aggressive lipid management with target LDL<70.   Alexis Goodell, MD Neurology [phone] 09/05/2019, 11:04 AM

## 2019-09-05 NOTE — Evaluation (Signed)
Physical Therapy Evaluation Patient Details Name: Alyssa Schultz MRN: [id number] DOB: September 22, 1965 Today's Date: 09/05/2019   History of Present Illness  Pt is a 54 y/o female who came in with transient R UE weakness/coordination issues, no residual weakness or functional limitations at time of PT exam and ED.  Pt is s/p L5-S1 TLIF on 07/26/2019. PMH signficant for HTN, DM, ACDF 2017, unspecified neuromuscular disorder.   Clinical Impression  Pt is able to do all bed mobility and transfers w/o assist, also able to don back brace independently.  She did well with  ambulation in the hallway with good speed, confidence and overall safety.  She displays = strength b/l with no coordination issues and again feels that she is at her recent baseline.  Overall pt did well and agrees that she does not need any PT follow up regarding acute and transient neuro issues.     Follow Up Recommendations No PT follow up    Equipment Recommendations  None recommended by PT    Recommendations for Other Services       Precautions / Restrictions Precautions Precautions: Back Precaution Comments: had back surgery  Required Braces or Orthoses: Spinal Brace Restrictions Weight Bearing Restrictions: No      Mobility  Bed Mobility Overal bed mobility: Independent             General bed mobility comments: Pt aware of back positioning, able to easily get to sitting EOB  Transfers Overall transfer level: Independent Equipment used: None             General transfer comment: Pt able to rise to standing w/o assist, donned back brace w/o assist, good overall safety and confidence  Ambulation/Gait Ambulation/Gait assistance: Modified independent (Device/Increase time) Gait Distance (Feet): 200 Feet Assistive device: None       General Gait Details: Pt was able to ambulate with consistent and confidence cadence, no hesitancy, LOBs or safety issues.  Pt reports feeling like she did 2 days ago, and  voices no concerns.  Stairs            Wheelchair Mobility    Modified Rankin (Stroke Patients Only)       Balance Overall balance assessment: Independent(w/o issue: maintain standing on toes, NBOS/EC, pertubations )                                           Pertinent Vitals/Pain Pain Assessment: No/denies pain(baseline R sciatic pain from sitting too long)    Home Living Family/patient expects to be discharged to:: Private residence Living Arrangements: Alone Available Help at Discharge: Friend(s);Available PRN/intermittently(24/7 if needed) Type of Home: House Home Access: Level entry     Home Layout: Two level;Bed/bath upstairs Home Equipment: Walker - 2 wheels;Bedside commode;Shower seat      Prior Function Level of Independence: Independent               Hand Dominance        Extremity/Trunk Assessment   Upper Extremity Assessment Upper Extremity Assessment: Overall WFL for tasks assessed(Pt reports that she has been able to use phone, etc normally)    Lower Extremity Assessment Lower Extremity Assessment: Overall WFL for tasks assessed(Pt with no sensation, strength or coordination issues)       Communication   Communication: No difficulties  Cognition Arousal/Alertness: Awake/alert Behavior During Therapy: WFL for tasks assessed/performed Overall Cognitive  Status: Within Functional Limits for tasks assessed                                        General Comments      Exercises     Assessment/Plan    PT Assessment Patent does not need any further PT services  PT Problem List Decreased safety awareness;Decreased mobility;Decreased balance       PT Treatment Interventions      PT Goals (Current goals can be found in the Care Plan section)  Acute Rehab PT Goals Patient Stated Goal: go home PT Goal Formulation: All assessment and education complete, DC therapy Time For Goal Achievement:  09/19/19 Potential to Achieve Goals: Good    Frequency     Barriers to discharge        Co-evaluation               AM-PAC PT "6 Clicks" Mobility  Outcome Measure Help needed turning from your back to your side while in a flat bed without using bedrails?: None Help needed moving from lying on your back to sitting on the side of a flat bed without using bedrails?: None Help needed moving to and from a bed to a chair (including a wheelchair)?: None Help needed standing up from a chair using your arms (e.g., wheelchair or bedside chair)?: None Help needed to walk in hospital room?: None Help needed climbing 3-5 steps with a railing? : None 6 Click Score: 24    End of Session Equipment Utilized During Treatment: Gait belt;Back brace Activity Tolerance: Patient tolerated treatment well Patient left: in bed;with call bell/phone within reach Nurse Communication: Mobility status PT Visit Diagnosis: Other symptoms and signs involving the nervous system (R29.898)    Time: [id number] PT Time Calculation (min) (ACUTE ONLY): 21 min   Charges:   PT Evaluation $PT Eval Low Complexity: 1 Low          Alyssa Schultz, DPT 09/05/2019, 10:38 AM

## 2019-09-05 NOTE — ED Notes (Signed)
Pt verbalized understanding of discharge instructions. NAD at this time. 

## 2019-09-05 NOTE — H&P (Addendum)
[place] [facility] [place] NAME: Alyssa Schultz    MR#:  [id number]  DATE OF BIRTH:  1965/10/23  DATE OF ADMISSION:  09/04/2019  PRIMARY CARE PHYSICIAN: Juline Patch, MD   REQUESTING/REFERRING PHYSICIAN: Gonzella Lex, MD CHIEF COMPLAINT:   Chief Complaint  Patient presents with  . Numbness    HISTORY OF PRESENT ILLNESS:  Alyssa Schultz  is a 54 y.o. African-American female with a known history of hypertension and type II obese mellitus, who presented to the emergency room with onset of expressive aphasia that lasted about 3 to 5 minutes as well as right upper extremity weakness and numbness that occurred at 9 PM this evening.  She denies any other paresthesias or focal muscle weakness.  No dysphagia or vertigo or tinnitus.  No witnessed seizures.  She denied chest pain or palpitations.  No cough or wheezing or dyspnea.  No exposure to COVID-19.  Upon position to the emergency room, heart rate was 105 with otherwise normal vital signs.  Labs revealed potassium of 3.1 with otherwise normal CMP.  CBC showed mild leukocytosis.  COVID-19 PCR is currently pending.  Noncontrasted head CT scan was normal.  The patient was given 4 baby aspirin and will be admitted to an observation medical monitored bed for further evaluation and monitoring. PAST MEDICAL HISTORY:   Past Medical History:  Diagnosis Date  . Complication of anesthesia    hard to wake in 2017, patient reports she is 30 lb lighter now  . Diabetes mellitus    dx 2014   just take pills  . Hyperlipidemia   . Hypertension   . Neuromuscular disorder ([place])     PAST SURGICAL HISTORY:   Past Surgical History:  Procedure Laterality Date  . abalsion    . ablation    . ANTERIOR CERVICAL DECOMP/DISCECTOMY FUSION N/A 09/19/2015   Procedure: ANTERIOR CERVICAL DECOMPRESSION/DISCECTOMY FUSION 2 LEVELS;  Surgeon: Phylliss Bob, MD;  Location: [facility];  Service: Orthopedics;  Laterality: N/A;  Anterior cervical  decompression fusion, cervical 3-4, cervical 4-5 decompression with instrumentation and allograft  . BACK SURGERY    . FACIAL RECONSTRUCTION SURGERY    . TRANSFORAMINAL LUMBAR INTERBODY FUSION (TLIF) WITH PEDICLE SCREW FIXATION 2 LEVEL Right 07/26/2019   Procedure: RIGHT-SIDED LUMBAR 5 - SACRUM 1 TRANSFORAMINAL LUMBAR INTERBODY FUSION WITH INSTRUMENTATION AND ALLOGRAFT;  Surgeon: Phylliss Bob, MD;  Location: [facility];  Service: Orthopedics;  Laterality: Right;    SOCIAL HISTORY:   Social History   Tobacco Use  . Smoking status: Never Smoker  . Smokeless tobacco: Never Used  Substance Use Topics  . Alcohol use: Yes    Comment: Occasional wine    FAMILY HISTORY:   Family History  Problem Relation Age of Onset  . Diabetes Mother   . Diabetes Maternal Grandmother   . Hypertension Maternal Grandfather   . Breast cancer Neg Hx     DRUG ALLERGIES:   Allergies  Allergen Reactions  . Influenza Vaccine Live     Per Dr - Pt is to not receive vaccine   . Influenza Vaccines   . Hydrocodone Rash  . Morphine And Related Other (See Comments)    Makes her crazy  . Prednisone     "makes me crazy"    REVIEW OF SYSTEMS:   ROS As per history of present illness. All pertinent systems were reviewed above. Constitutional,  HEENT, cardiovascular, respiratory, GI, GU, musculoskeletal, neuro, psychiatric, endocrine,  integumentary and hematologic systems were  reviewed and are otherwise  negative/unremarkable except for positive findings mentioned above in the HPI.   MEDICATIONS AT HOME:   Prior to Admission medications   Medication Sig Start Date End Date Taking? Authorizing Provider  Accu-Chek FastClix Lancets MISC USE TO TEST ONCE DAILY 11/04/18  Yes Otilio Miu C, MD  glucose blood (ACCU-CHEK GUIDE) test strip Accu-Chek Guide strips   Yes [provider]  hydrochlorothiazide (HYDRODIURIL) 25 MG tablet Take 1 tablet (25 mg total) by mouth daily. 08/24/19  Yes Juline Patch,  MD  lisinopril (ZESTRIL) 10 MG tablet Take 1 tablet (10 mg total) by mouth daily. Patient taking differently: Take 10 mg by mouth at bedtime.  07/17/19  Yes Juline Patch, MD  metFORMIN (GLUCOPHAGE) 500 MG tablet TAKE 1 TABLET BY MOUTH 2 TIMES DAILY WITH A MEAL Patient taking differently: Take 500 mg by mouth 3 (three) times daily. TAKE 1 TABLET BY MOUTH 3 TIMES DAILY WITH A MEAL/ Dr Honor Junes 09/02/18  Yes Juline Patch, MD  methocarbamol (ROBAXIN) 500 MG tablet Take 1 tablet (500 mg total) by mouth every 6 (six) hours as needed for muscle spasms. 07/27/19  Yes Phylliss Bob, MD  oxyCODONE-acetaminophen (PERCOCET/ROXICET) 5-325 MG tablet Take 1-2 tablets by mouth every 4 (four) hours as needed for moderate pain or severe pain. 07/27/19  Yes Phylliss Bob, MD  rosuvastatin (CRESTOR) 10 MG tablet Take 1 tablet by mouth every evening. 06/09/19  Yes [provider]  saxagliptin HCl (ONGLYZA) 5 MG TABS tablet Take 1 tablet (5 mg total) by mouth daily. 06/23/18  Yes Juline Patch, MD  TRUEPLUS LANCETS 30G MISC USE AS DIRECTED 03/06/16  Yes Jackolyn Confer, MD  valACYclovir (VALTREX) 500 MG tablet Take 500 mg by mouth as needed.   Yes [provider]  meclizine (ANTIVERT) 50 MG tablet Take 1 tablet (50 mg total) by mouth 3 (three) times daily as needed for dizziness. Patient not taking: Reported on 09/05/2019 01/06/15   Crecencio Mc P, PA-C  nystatin cream (MYCOSTATIN) Apply 1 application topically 2 (two) times daily. Patient not taking: Reported on 09/05/2019 06/01/19   Juline Patch, MD      VITAL SIGNS:  Blood pressure 112/77, pulse 98, temperature 98.8 F (37.1 C), temperature source Oral, resp. rate 20, height 5' 5.5" (1.664 m), weight 67.1 kg, last menstrual period 07/24/2007, SpO2 100 %.  PHYSICAL EXAMINATION:  Physical Exam  GENERAL:  54 y.o.-year-old African-American female patient lying in the bed with no acute distress.  EYES: Pupils equal, round, reactive  to light and accommodation. No scleral icterus. Extraocular muscles intact.  HEENT: Head atraumatic, normocephalic. Oropharynx and nasopharynx clear.  NECK:  Supple, no jugular venous distention. No thyroid enlargement, no tenderness.  LUNGS: Normal breath sounds bilaterally, no wheezing, rales,rhonchi or crepitation. No use of accessory muscles of respiration.  CARDIOVASCULAR: Regular rate and rhythm, S1, S2 normal. No murmurs, rubs, or gallops.  ABDOMEN: Soft, nondistended, nontender. Bowel sounds present. No organomegaly or mass.  EXTREMITIES: No pedal edema, cyanosis, or clubbing.  NEUROLOGIC: Cranial nerves II through XII are intact. Muscle strength 5/5 in all extremities. Sensation intact. Gait not checked.  PSYCHIATRIC: The patient is alert and oriented x 3.  Normal affect and good eye contact. SKIN: No obvious rash, lesion, or ulcer.   LABORATORY PANEL:   CBC Recent Labs  Lab 09/04/19 2153  WBC 11.4*  HGB 12.9  HCT 39.3  PLT 282   ------------------------------------------------------------------------------------------------------------------  Chemistries  Recent Labs  Lab 09/04/19 2153  NA 139  K 3.1*  CL 98  CO2 26  GLUCOSE 146*  BUN 17  CREATININE 0.60  CALCIUM 9.8  AST 18  ALT 12  ALKPHOS 54  BILITOT 0.7   ------------------------------------------------------------------------------------------------------------------  Cardiac Enzymes No results for input(s): TROPONINI in the last 168 hours. ------------------------------------------------------------------------------------------------------------------  RADIOLOGY:  CT HEAD WO CONTRAST  Result Date: 09/04/2019 CLINICAL DATA:  Sudden onset aphasia and numbness EXAM: CT HEAD WITHOUT CONTRAST TECHNIQUE: Contiguous axial images were obtained from the base of the skull through the vertex without intravenous contrast. COMPARISON:  None. FINDINGS: Brain: There is no mass, hemorrhage or extra-axial collection.  The size and configuration of the ventricles and extra-axial CSF spaces are normal. The brain parenchyma is normal, without acute or chronic infarction. Vascular: No abnormal hyperdensity of the major intracranial arteries or dural venous sinuses. No intracranial atherosclerosis. Skull: The visualized skull base, calvarium and extracranial soft tissues are normal. Sinuses/Orbits: No fluid levels or advanced mucosal thickening of the visualized paranasal sinuses. No mastoid or middle ear effusion. The orbits are normal. IMPRESSION: Normal head CT. Electronically Signed   By: Ulyses Jarred M.D.   On: 09/04/2019 22:20   MR BRAIN WO CONTRAST  Result Date: 09/05/2019 CLINICAL DATA:  Transient ischemic attack. Right upper extremity weakness. EXAM: MRI HEAD WITHOUT CONTRAST TECHNIQUE: Multiplanar, multiecho pulse sequences of the brain and surrounding structures were obtained without intravenous contrast. COMPARISON:  Head CT 09/04/2019 FINDINGS: BRAIN: There is no acute infarct, acute hemorrhage or extra-axial collection. The white matter signal is normal for the patient's age. The cerebral and cerebellar volume are age-appropriate. There is no hydrocephalus. The midline structures are normal. VASCULAR: The major intracranial arterial and venous sinus flow voids are normal. Susceptibility-sensitive sequences show no chronic microhemorrhage or superficial siderosis. SKULL AND UPPER CERVICAL SPINE: Calvarial bone marrow signal is normal. There is no skull base mass. The visualized upper cervical spine and soft tissues are normal. SINUSES/ORBITS: There are no fluid levels or advanced mucosal thickening. The mastoid air cells and middle ear cavities are free of fluid. The orbits are normal. IMPRESSION: Normal MRI of the brain. Electronically Signed   By: Ulyses Jarred M.D.   On: 09/05/2019 01:52      IMPRESSION AND PLAN:   1.  TIA with expressive aphasia and right upper extremity weakness and numbness.  The patient  will be admitted to an observation medically monitored bed.  We will follow neuro checks q.4 hours for 24 hours.  The patient will be placed on aspirin.  Will obtain a brain MRI without contrast as well as bilateral carotid Doppler and 2D echo with bubble study .  A neurology consultation (when available) as well as physical/occupation/speech therapy consults will be obtained in a.m..  The patient will be placed on statin therapy and fasting lipids will be checked.  I notified Dr. Doy Mince about the patient.  2.  Type 2 diabetes mellitus.  The patient will be placed on supplement coverage with NovoLog.  We will hold off Metformin and continue Onglyza.  3.  Hypertension.  We will continue antihypertensives with permissive parameters.  4.  Dyslipidemia.  We will continue statin therapy and check fasting lipids.  5.  DVT prophylaxis.  Subcutaneous Lovenox   All the records are reviewed and case discussed with ED provider. The plan of care was discussed in details with the patient (and family). I answered all questions. The patient agreed to proceed with the above mentioned plan. Further  management will depend upon hospital course.   CODE STATUS: Full code  TOTAL TIME TAKING CARE OF THIS PATIENT: 55 minutes.    Christel Mormon M.D on 09/05/2019 at 4:02 AM  [facility]   From 7 PM-7 AM, contact night-coverage [email or web address]  CC: Primary care physician; Juline Patch, MD   Note: This dictation was prepared with Dragon dictation along with smaller phrase technology. Any transcriptional errors that result from this process are unintentional.

## 2019-09-05 NOTE — ED Notes (Signed)
Patient transported to MRI 

## 2019-09-05 NOTE — ED Notes (Signed)
Admitting MD at bedside.

## 2019-09-05 NOTE — Progress Notes (Signed)
OT Screen Note  Patient Details Name: Alyssa Schultz MRN: [id number] DOB: [birth year]   Cancelled Treatment:    Reason Eval/Treat Not Completed: OT screened, no needs identified, will sign off. Consult received, chart reviewed. Spoke with PT and RN. Pt back to independent baseline, no skilled OT needs identified. Will sign off. Thank you for the consult.   Jeni Salles, MPH, MS, OTR/L ascom [phone] 09/05/19, 1:36 PM

## 2019-09-05 NOTE — Progress Notes (Signed)
Patient heading to MRI, will need to follow up with pt when she returns

## 2019-09-05 NOTE — Progress Notes (Signed)
*  PRELIMINARY RESULTS* Echocardiogram 2D Echocardiogram has been performed.  Alyssa Schultz 09/05/2019, 10:00 AM

## 2019-09-06 ENCOUNTER — Encounter: Payer: Self-pay | Admitting: Family Medicine

## 2019-09-06 DIAGNOSIS — H11423 Conjunctival edema, bilateral: Secondary | ICD-10-CM | POA: Diagnosis not present

## 2019-09-06 NOTE — Discharge Summary (Addendum)
Physician Discharge Summary  Patient ID: Alyssa Schultz MRN: [id number] DOB/AGE: 01-26-1966 54 y.o.  Admit date: 09/04/2019 Discharge date: 09/06/2019  Admission Diagnoses:  Discharge Diagnoses:  Active Problems:   TIA (transient ischemic attack)   Discharged Condition: fair  Hospital Course:  Alyssa Schultz  is a 54 y.o. African-American female with a known history of hypertension and type II obese mellitus, who presented to the emergency room with onset of expressive aphasia that lasted about 3 to 5 minutes as well as right upper extremity weakness and numbness that occurred at 9 PM this evening.  She denies any other paresthesias or focal muscle weakness.  No dysphagia or vertigo or tinnitus.  No witnessed seizures.  She denied chest pain or palpitations.  No cough or wheezing or dyspnea.  No exposure to COVID-19. The patient was given 4 baby aspirin.  1.  TIA with expressive aphasia and right upper extremity weakness and numbness. ----> Resolved all symptoms  - CT-Head and MRI - Brain --> Negative for any stroke or other findings  - Carotid Doppler and 2D echo with bubble study ---- Negative  - Neurology consultation: ASA 81mg  + Plavix 75mg  for 3 weeks then ASA 81mg  daily. High dose statin daily. - Called back Pt on 09/06/19 after discharge and reiterate the duration of DAP and followed ASA. Pt expressed understanding.  - PT cleared  - Healthy diet and lifestyle modification strongly encouraged  - F/U PCP   2.  Type 2 diabetes mellitus.  -- Controlled  - Resume home meds    3.  Hypertension. - Resume home meds   4.  Dyslipidemia. - Higher dose given to continue   Consults: neurology  Significant Diagnostic Studies: Radiology and Blood Work. Echo.   Treatments: as per hospital course and d/c medlist   Discharge Exam: Blood pressure 107/80, pulse 87, temperature 98.7 F (37.1 C), resp. rate 17, height 5' 5.5" (1.664 m), weight 67.1 kg, last menstrual period 07/24/2007,  SpO2 99 %.  GENERAL: No acute distress  EYES: Pupils equal, round, reactive to light and accommodation. No scleral icterus. Extraocular muscles intact.  HEENT: Head atraumatic, normocephalic. Oropharynx and nasopharynx clear.  NECK:  Supple, no jugular venous distention. No thyroid enlargement, no tenderness.  LUNGS: Normal breath sounds bilaterally, no wheezing, rales,rhonchi or crepitation. No use of accessory muscles of respiration.  CARDIOVASCULAR: Regular rate and rhythm, S1, S2 normal. No murmurs, rubs, or gallops.  ABDOMEN: Soft, nondistended, nontender. Bowel sounds present. No organomegaly or mass.  EXTREMITIES: No pedal edema, cyanosis, or clubbing.  NEUROLOGIC: Cranial nerves II through XII are intact. Muscle strength 5/5 in all extremities. Sensation intact. PSYCHIATRIC: The patient is alert and oriented x 3.  Normal affect and good eye contact. SKIN: No obvious rash, lesion, or ulcer.   Disposition: Discharge disposition: 01-Home or Self Care      Stable to d/c home. F/U PCP.   Discharge Instructions    Call MD for:  persistant dizziness or light-headedness   Complete by: As directed    Diet - low sodium heart healthy   Complete by: As directed    Increase activity slowly   Complete by: As directed      Allergies as of 09/05/2019      Reactions   Influenza Vaccine Live    Per Dr - Pt is to not receive vaccine    Influenza Vaccines    Hydrocodone Rash   Morphine And Related Other (See Comments)   Makes her crazy  Prednisone    "makes me crazy"      Medication List    TAKE these medications   Accu-Chek Guide test strip Generic drug: glucose blood Accu-Chek Guide strips   aspirin 81 MG EC tablet Take 1 tablet (81 mg total) by mouth daily.   clopidogrel 75 MG tablet Commonly known as: PLAVIX Take 1 tablet (75 mg total) by mouth daily.   hydrochlorothiazide 25 MG tablet Commonly known as: HYDRODIURIL Take 1 tablet (25 mg total) by mouth daily.    lisinopril 10 MG tablet Commonly known as: ZESTRIL Take 1 tablet (10 mg total) by mouth daily. What changed: when to take this   meclizine 50 MG tablet Commonly known as: ANTIVERT Take 1 tablet (50 mg total) by mouth 3 (three) times daily as needed for dizziness.   metFORMIN 500 MG tablet Commonly known as: GLUCOPHAGE TAKE 1 TABLET BY MOUTH 2 TIMES DAILY WITH A MEAL What changed:   how much to take  how to take this  when to take this  additional instructions   methocarbamol 500 MG tablet Commonly known as: ROBAXIN Take 1 tablet (500 mg total) by mouth every 6 (six) hours as needed for muscle spasms.   nystatin cream Commonly known as: MYCOSTATIN Apply 1 application topically 2 (two) times daily.   oxyCODONE-acetaminophen 5-325 MG tablet Commonly known as: PERCOCET/ROXICET Take 1-2 tablets by mouth every 4 (four) hours as needed for moderate pain or severe pain.   rosuvastatin 40 MG tablet Commonly known as: CRESTOR Take 1 tablet (40 mg total) by mouth daily. What changed:   medication strength  how much to take  when to take this   saxagliptin HCl 5 MG Tabs tablet Commonly known as: ONGLYZA Take 1 tablet (5 mg total) by mouth daily.   TRUEplus Lancets 30G Misc USE AS DIRECTED   Accu-Chek FastClix Lancets Misc USE TO TEST ONCE DAILY   valACYclovir 500 MG tablet Commonly known as: VALTREX Take 500 mg by mouth as needed.      Follow-up Information    Juline Patch, MD. Schedule an appointment as soon as possible for a visit in 2 week(s).   Specialty: Family Medicine Why: f/u Contact information: [address] [place] [address] [phone]           Signed: Thornell Mule 09/06/2019, 9:05 AM

## 2019-09-07 ENCOUNTER — Other Ambulatory Visit: Payer: Self-pay

## 2019-09-07 ENCOUNTER — Ambulatory Visit: Payer: 59 | Admitting: Family Medicine

## 2019-09-07 ENCOUNTER — Encounter: Payer: Self-pay | Admitting: Family Medicine

## 2019-09-07 VITALS — BP 110/62 | HR 100 | Ht 65.5 in | Wt 144.0 lb

## 2019-09-07 DIAGNOSIS — G459 Transient cerebral ischemic attack, unspecified: Secondary | ICD-10-CM | POA: Diagnosis not present

## 2019-09-07 DIAGNOSIS — I10 Essential (primary) hypertension: Secondary | ICD-10-CM | POA: Diagnosis not present

## 2019-09-07 NOTE — Patient Instructions (Signed)

## 2019-09-07 NOTE — Progress Notes (Signed)
Date:  09/07/2019   Name:  Alyssa Schultz   DOB:  1965-12-07   MRN:  [id number]   Chief Complaint: Hypertension (pt says b/p is running around 99/76, 99/67, 115/76)  Hypertension This is a chronic problem. The current episode started more than 1 year ago. The problem has been waxing and waning since onset. The problem is controlled. Pertinent negatives include no anxiety, blurred vision, chest pain, headaches, malaise/fatigue, neck pain, orthopnea, palpitations, peripheral edema, PND, shortness of breath or sweats. There are no associated agents to hypertension. There are no known risk factors for coronary artery disease. Past treatments include ACE inhibitors and diuretics. The current treatment provides moderate improvement. There are no compliance problems.  Hypertensive end-organ damage includes CVA. There is no history of angina, kidney disease, CAD/MI, heart failure, left ventricular hypertrophy, PVD or retinopathy. tia. There is no history of chronic renal disease, a hypertension causing med or renovascular disease.    Lab Results  Component Value Date   CREATININE 0.60 09/04/2019   BUN 17 09/04/2019   NA 139 09/04/2019   K 3.4 (L) 09/05/2019   CL 98 09/04/2019   CO2 26 09/04/2019   Lab Results  Component Value Date   CHOL 202 (H) 09/05/2019   HDL 68 09/05/2019   LDLCALC 118 (H) 09/05/2019   LDLDIRECT 153.0 10/02/2013   TRIG 79 09/05/2019   CHOLHDL 3.0 09/05/2019   Lab Results  Component Value Date   TSH 2.446 12/07/2017   Lab Results  Component Value Date   HGBA1C 6.1 (H) 09/05/2019     Review of Systems  Constitutional: Negative.  Negative for chills, fatigue, fever, malaise/fatigue and unexpected weight change.  HENT: Negative for congestion, ear discharge, ear pain, rhinorrhea, sinus pressure, sneezing and sore throat.   Eyes: Negative for blurred vision, photophobia, pain, discharge, redness and itching.  Respiratory: Negative for cough, shortness of breath,  wheezing and stridor.   Cardiovascular: Negative for chest pain, palpitations, orthopnea and PND.  Gastrointestinal: Negative for abdominal pain, blood in stool, constipation, diarrhea, nausea and vomiting.  Endocrine: Negative for cold intolerance, heat intolerance, polydipsia, polyphagia and polyuria.  Genitourinary: Negative for dysuria, flank pain, frequency, hematuria, menstrual problem, pelvic pain, urgency, vaginal bleeding and vaginal discharge.  Musculoskeletal: Negative for arthralgias, back pain, myalgias and neck pain.  Skin: Negative for rash.  Allergic/Immunologic: Negative for environmental allergies and food allergies.  Neurological: Negative for dizziness, weakness, light-headedness, numbness and headaches.  Hematological: Negative for adenopathy. Does not bruise/bleed easily.  Psychiatric/Behavioral: Negative for dysphoric mood. The patient is not nervous/anxious.     Patient Active Problem List   Diagnosis Date Noted  . TIA (transient ischemic attack) 09/05/2019  . Radiculopathy 07/26/2019  . Leg pain 02/25/2018  . Edema 02/25/2018  . Hyperlipidemia 12/10/2017  . Screening for breast cancer 06/03/2017  . Screen for colon cancer 06/03/2017  . Bronchitis 12/03/2016  . Left hip pain 06/04/2016  . Routine general medical examination at a health care facility 03/04/2016  . 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 Allergic rhinitis 10/14/2012  . Hypertension 11/09/2011  . Rash 10/01/2011  . Diabetes mellitus type 2, controlled ([place]) 05/21/2011  . BMI 30.0-30.9,adult 05/21/2011    Allergies  Allergen Reactions  . Influenza Vaccine Live     Per Dr - Pt is to not receive vaccine   . Influenza Vaccines   . Hydrocodone Rash  . Morphine And Related Other (See Comments)    Makes  her crazy  . Prednisone     "makes me crazy"    Past Surgical History:  Procedure Laterality Date  . abalsion    . ablation    . ANTERIOR CERVICAL  DECOMP/DISCECTOMY FUSION N/A 09/19/2015   Procedure: ANTERIOR CERVICAL DECOMPRESSION/DISCECTOMY FUSION 2 LEVELS;  Surgeon: Phylliss Bob, MD;  Location: [place];  Service: Orthopedics;  Laterality: N/A;  Anterior cervical decompression fusion, cervical 3-4, cervical 4-5 decompression with instrumentation and allograft  . BACK SURGERY    . FACIAL RECONSTRUCTION SURGERY    . TRANSFORAMINAL LUMBAR INTERBODY FUSION (TLIF) WITH PEDICLE SCREW FIXATION 2 LEVEL Right 07/26/2019   Procedure: RIGHT-SIDED LUMBAR 5 - SACRUM 1 TRANSFORAMINAL LUMBAR INTERBODY FUSION WITH INSTRUMENTATION AND ALLOGRAFT;  Surgeon: Phylliss Bob, MD;  Location: [place];  Service: Orthopedics;  Laterality: Right;    Social History   Tobacco Use  . Smoking status: Never Smoker  . Smokeless tobacco: Never Used  Substance Use Topics  . Alcohol use: Yes    Comment: Occasional wine  . Drug use: No     Medication list has been reviewed and updated.  Current Meds  Medication Sig  . Accu-Chek FastClix Lancets MISC USE TO TEST ONCE DAILY  . aspirin EC 81 MG EC tablet Take 1 tablet (81 mg total) by mouth daily.  . clopidogrel (PLAVIX) 75 MG tablet Take 1 tablet (75 mg total) by mouth daily.  Marland Kitchen glucose blood (ACCU-CHEK GUIDE) test strip Accu-Chek Guide strips  . hydrochlorothiazide (HYDRODIURIL) 25 MG tablet Take 1 tablet (25 mg total) by mouth daily.  Marland Kitchen lisinopril (ZESTRIL) 10 MG tablet Take 1 tablet (10 mg total) by mouth daily. (Patient taking differently: Take 10 mg by mouth at bedtime. )  . metFORMIN (GLUCOPHAGE) 500 MG tablet TAKE 1 TABLET BY MOUTH 2 TIMES DAILY WITH A MEAL (Patient taking differently: Take 500 mg by mouth 3 (three) times daily. TAKE 1 TABLET BY MOUTH 3 TIMES DAILY WITH A MEAL/ Dr Honor Junes)  . rosuvastatin (CRESTOR) 40 MG tablet Take 1 tablet (40 mg total) by mouth daily.  . saxagliptin HCl (ONGLYZA) 5 MG TABS tablet Take 1 tablet (5 mg total) by mouth daily.  . TRUEPLUS LANCETS 30G MISC USE AS DIRECTED  .  valACYclovir (VALTREX) 500 MG tablet Take 500 mg by mouth as needed.    PHQ 2/9 Scores 06/01/2019 04/21/2018 12/23/2017 06/03/2017  PHQ - 2 Score 0 0 0 0  PHQ- 9 Score 0 0 - -    BP Readings from Last 3 Encounters:  09/07/19 110/62  09/05/19 107/80  07/27/19 98/65    Physical Exam Vitals and nursing note reviewed.  Constitutional:      General: She is not in acute distress.    Appearance: She is not diaphoretic.  HENT:     Head: Normocephalic and atraumatic.     Right Ear: Tympanic membrane, ear canal and external ear normal.     Left Ear: Tympanic membrane, ear canal and external ear normal.     Nose: Nose normal. No congestion or rhinorrhea.  Eyes:     General:        Right eye: No discharge.        Left eye: No discharge.     Conjunctiva/sclera: Conjunctivae normal.     Pupils: Pupils are equal, round, and reactive to light.  Neck:     Thyroid: No thyromegaly.     Vascular: No JVD.  Cardiovascular:     Rate and Rhythm: Normal rate and regular  rhythm.     Heart sounds: Normal heart sounds. No murmur. No friction rub. No gallop.   Pulmonary:     Effort: Pulmonary effort is normal.     Breath sounds: Normal breath sounds. No wheezing, rhonchi or rales.  Abdominal:     General: Bowel sounds are normal.     Palpations: Abdomen is soft. There is no mass.     Tenderness: There is no abdominal tenderness. There is no guarding.  Musculoskeletal:        General: Normal range of motion.     Cervical back: Normal range of motion and neck supple.  Lymphadenopathy:     Cervical: No cervical adenopathy.  Skin:    General: Skin is warm and dry.     Findings: No bruising or erythema.  Neurological:     Mental Status: She is alert.     Deep Tendon Reflexes: Reflexes are normal and symmetric.     Wt Readings from Last 3 Encounters:  09/07/19 144 lb (65.3 kg)  09/04/19 148 lb (67.1 kg)  07/26/19 151 lb (68.5 kg)    BP 110/62   Pulse 100   Ht 5' 5.5" (1.664 m)   Wt 144 lb  (65.3 kg)   LMP 07/24/2007 Comment: ablation in 2008-pt reports she went through menopause after this  BMI 23.60 kg/m   Assessment and Plan:  1. Essential hypertension Chronic.  Controlled.  Stable.  Patient does have a concern about some low readings that she has had but she is not orthostatically compromised.  We have discussed the new standards of blood pressure control being under 120/80 and that it would make sense that we would continue in the current direction of her blood pressure control. we also discussed the importance of lisinopril for protection of diabetic nephropathy.  Patient has agreed to continue at present dosing and we will check patient for physician to care from hospital in about a week.  2. TIA (transient ischemic attack) Patient is currently stable with no recurrence of symptoms.  Blood pressure readings are normal.  Upon return we will be evaluating her lipid status as well as recheck and for hypokalemia.

## 2019-09-08 ENCOUNTER — Encounter: Payer: Self-pay | Admitting: Family Medicine

## 2019-09-08 ENCOUNTER — Other Ambulatory Visit: Payer: Self-pay

## 2019-09-08 DIAGNOSIS — R110 Nausea: Secondary | ICD-10-CM

## 2019-09-08 MED ORDER — ONDANSETRON HCL 4 MG PO TABS: 4.0000 mg | ORAL_TABLET | Freq: Three times a day (TID) | ORAL | 0 refills | Status: DC | PRN

## 2019-09-09 ENCOUNTER — Other Ambulatory Visit: Payer: Self-pay

## 2019-09-09 ENCOUNTER — Ambulatory Visit
Admission: EM | Admit: 2019-09-09 | Discharge: 2019-09-09 | Disposition: A | Discharge status: Home / Self Care | Payer: 59 | Attending: Urgent Care | Admitting: Urgent Care

## 2019-09-09 ENCOUNTER — Encounter: Payer: Self-pay | Admitting: Emergency Medicine

## 2019-09-09 DIAGNOSIS — E876 Hypokalemia: Secondary | ICD-10-CM

## 2019-09-09 DIAGNOSIS — E871 Hypo-osmolality and hyponatremia: Secondary | ICD-10-CM

## 2019-09-09 DIAGNOSIS — R42 Dizziness and giddiness: Secondary | ICD-10-CM | POA: Diagnosis not present

## 2019-09-09 LAB — URINALYSIS, COMPLETE (UACMP) WITH MICROSCOPIC
Bacteria, UA: NONE SEEN
Bilirubin Urine: NEGATIVE
Glucose, UA: NEGATIVE mg/dL
Hgb urine dipstick: NEGATIVE
Ketones, ur: NEGATIVE mg/dL
Leukocytes,Ua: NEGATIVE
Nitrite: NEGATIVE
Protein, ur: NEGATIVE mg/dL
RBC / HPF: NONE SEEN RBC/hpf (ref 0–5)
Specific Gravity, Urine: 1.01 (ref 1.005–1.030)
pH: 6.5 (ref 5.0–8.0)

## 2019-09-09 LAB — BASIC METABOLIC PANEL
Anion gap: 14 (ref 5–15)
BUN: 23 mg/dL — ABNORMAL HIGH (ref 6–20)
CO2: 21 mmol/L — ABNORMAL LOW (ref 22–32)
Calcium: 9.3 mg/dL (ref 8.9–10.3)
Chloride: 98 mmol/L (ref 98–111)
Creatinine, Ser: 0.84 mg/dL (ref 0.44–1.00)
GFR calc Af Amer: >60 mL/min (ref >60)
GFR calc non Af Amer: >60 mL/min (ref >60)
Glucose, Bld: 103 mg/dL — ABNORMAL HIGH (ref 70–99)
Potassium: 3.4 mmol/L — ABNORMAL LOW (ref 3.5–5.1)
Sodium: 133 mmol/L — ABNORMAL LOW (ref 135–145)

## 2019-09-09 LAB — CBC
HCT: 38.7 % (ref 36.0–46.0)
Hemoglobin: 13.2 g/dL (ref 12.0–15.0)
MCH: 27.9 pg (ref 26.0–34.0)
MCHC: 34.1 g/dL (ref 30.0–36.0)
MCV: 81.8 fL (ref 80.0–100.0)
Platelets: 323 10*3/uL (ref 150–400)
RBC: 4.73 MIL/uL (ref 3.87–5.11)
RDW: 13.4 % (ref 11.5–15.5)
WBC: 10.5 10*3/uL (ref 4.0–10.5)
nRBC: 0 % (ref 0.0–0.2)

## 2019-09-09 NOTE — ED Provider Notes (Addendum)
[place], [place]   Name: Alyssa Schultz DOB: 01/06/1966 MRN: [id number] CSN: [id number] PCP: Juline Patch, MD  Arrival date and time:  09/09/19 1231  Chief Complaint:  Dizziness   NOTE: Prior to seeing the patient today, I have reviewed the triage nursing documentation and vital signs. Clinical staff has updated patient's PMH/PSHx, current medication list, and drug allergies/intolerances to ensure comprehensive history available to assist in medical decision making.   History:   HPI: Alyssa Schultz is a 54 y.o. female who presents today with complaints of dizziness, nausea, and sensation of her heart racing that started on Wednesday (09/06/19). Patient s/p recent TIA and was started on clopidogrel and daily ASA. Patient notes that symptoms started after she began taking anticoagulant. No associated headaches, confusion, ataxia, or focal weakness. Additionally, patient is s/p recent back surgery in December 2020; presents to clinic in a back brace. Of note, patient is no longer taking any type of opioid pain medications or SMRs. She reports that her energy has been low overall this week. Patient has not experienced any chest pain, shortness of breath, or recurrent neurological symptoms since being discharged from the hospital following her TIA. She is eating and drinking well. Recent events have led to a change in her diet. She is making efforts to reduce dietary sodium and fat intake. She notes recently hypokalemia for which she was on short term oral supplementation. Patient has repeat labs scheduled on Wednesday next week to follow up. Patient advising that blood pressure have been running low at home. SBPs have been in the 90s. She feels as if she is on too much medication; takes lisinopril 10 mg and HCTZ 25 mg daily. PMH also (+) for T2DM. CBGs well controlled on oral antidiabetic agents. Patient with 40 pound intentional weight loss over the course of the last year. She inquires about being able  to reduce statin dose citing that she feels "overloaded" taking all of the medications that she is on. Patient presents today advising that she was sent by her PCP for evaluation of possible dehydration.   Past Medical History:  Diagnosis Date  . Complication of anesthesia    hard to wake in 2017, patient reports she is 30 lb lighter now  . Diabetes mellitus    dx 2014   just take pills  . Hyperlipidemia   . Hypertension   . Neuromuscular disorder [facility])     Past Surgical History:  Procedure Laterality Date  . abalsion    . ablation    . ANTERIOR CERVICAL DECOMP/DISCECTOMY FUSION N/A 09/19/2015   Procedure: ANTERIOR CERVICAL DECOMPRESSION/DISCECTOMY FUSION 2 LEVELS;  Surgeon: Phylliss Bob, MD;  Location: [place];  Service: Orthopedics;  Laterality: N/A;  Anterior cervical decompression fusion, cervical 3-4, cervical 4-5 decompression with instrumentation and allograft  . BACK SURGERY    . FACIAL RECONSTRUCTION SURGERY    . TRANSFORAMINAL LUMBAR INTERBODY FUSION (TLIF) WITH PEDICLE SCREW FIXATION 2 LEVEL Right 07/26/2019   Procedure: RIGHT-SIDED LUMBAR 5 - SACRUM 1 TRANSFORAMINAL LUMBAR INTERBODY FUSION WITH INSTRUMENTATION AND ALLOGRAFT;  Surgeon: Phylliss Bob, MD;  Location: [place];  Service: Orthopedics;  Laterality: Right;    Family History  Problem Relation Age of Onset  . Diabetes Mother   . Diabetes Maternal Grandmother   . Hypertension Maternal Grandfather   . Breast cancer Neg Hx     Social History   Tobacco Use  . Smoking status: Never Smoker  . Smokeless tobacco: Never Used  Substance Use  Topics  . Alcohol use: Yes    Comment: Occasional wine  . Drug use: No    Patient Active Problem List   Diagnosis Date Noted  . TIA (transient ischemic attack) 09/05/2019  . Radiculopathy 07/26/2019  . Leg pain 02/25/2018  . Edema 02/25/2018  . Hyperlipidemia 12/10/2017  . Screening for breast cancer 06/03/2017  . Screen for colon cancer 06/03/2017  . Bronchitis 12/03/2016    . Left hip pain 06/04/2016  . Routine general medical examination at a health care facility 03/04/2016  . 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 Allergic rhinitis 10/14/2012  . Hypertension 11/09/2011  . Rash 10/01/2011  . Diabetes mellitus type 2, controlled ([place]) 05/21/2011  . BMI 30.0-30.9,adult 05/21/2011    Home Medications:    Current Meds  Medication Sig  . aspirin EC 81 MG EC tablet Take 1 tablet (81 mg total) by mouth daily.  . clopidogrel (PLAVIX) 75 MG tablet Take 1 tablet (75 mg total) by mouth daily.  . hydrochlorothiazide (HYDRODIURIL) 25 MG tablet Take 1 tablet (25 mg total) by mouth daily.  Marland Kitchen lisinopril (ZESTRIL) 10 MG tablet Take 1 tablet (10 mg total) by mouth daily. (Patient taking differently: Take 10 mg by mouth at bedtime. )  . metFORMIN (GLUCOPHAGE) 500 MG tablet TAKE 1 TABLET BY MOUTH 2 TIMES DAILY WITH A MEAL (Patient taking differently: Take 500 mg by mouth 3 (three) times daily. TAKE 1 TABLET BY MOUTH 3 TIMES DAILY WITH A MEAL/ Dr Honor Junes)  . rosuvastatin (CRESTOR) 40 MG tablet Take 1 tablet (40 mg total) by mouth daily.    Allergies:   Influenza vaccine live, Influenza vaccines, Hydrocodone, Morphine and related, and Prednisone  Review of Systems (ROS): Review of Systems  Constitutional: Positive for fatigue. Negative for diaphoresis and fever.  Respiratory: Negative for cough and shortness of breath.   Cardiovascular: Negative for chest pain and leg swelling.       Periods of heart racing. SBPs running low (90s) at home.   Gastrointestinal: Positive for nausea. Negative for abdominal pain, blood in stool, constipation, diarrhea and vomiting.  Endocrine:       PMH (+) for diabetes.  Genitourinary: Negative for dysuria, frequency, hematuria and urgency.  Musculoskeletal: Negative for back pain and myalgias.  Skin: Negative for rash.  Neurological: Positive for dizziness. Negative for tremors, weakness and  headaches.  Hematological: Does not bruise/bleed easily.  Psychiatric/Behavioral: Negative for suicidal ideas. The patient is not nervous/anxious.   All other systems reviewed and are negative.    Vital Signs: Today's Vitals   09/09/19 1248 09/09/19 1252 09/09/19 1351  BP:  106/77   Pulse:  84   Resp:  14   Temp:  98.1 F (36.7 C)   TempSrc:  Oral   SpO2:  100%   Weight: 143 lb (64.9 kg)    Height: 5\' 5"  (1.651 m)    PainSc: 0-No pain  0-No pain    Physical Exam: Physical Exam  Constitutional: She is oriented to person, place, and time and well-developed, well-nourished, and in no distress.  HENT:  Head: Normocephalic and atraumatic.  Eyes: Pupils are equal, round, and reactive to light.  Neck: No tracheal deviation present.  Cardiovascular: Normal rate, regular rhythm, normal heart sounds and intact distal pulses.  Pulmonary/Chest: Effort normal and breath sounds normal.  Musculoskeletal:        General: Normal range of motion.     Cervical back: Normal range of motion and  neck supple.     Comments: Recent back surgery; presents to clinic in brace.   Neurological: She is alert and oriented to person, place, and time. She has normal sensation, normal reflexes and intact cranial nerves. Gait normal.  Skin: Skin is warm and dry. No rash noted. She is not diaphoretic.  Psychiatric: Mood, memory, affect and judgment normal.  Nursing note and vitals reviewed.   Urgent Care Treatments / Results:   Orders Placed This Encounter  Procedures  . CBC  . Basic metabolic panel  . Urinalysis, Complete w Microscopic    LABS: PLEASE NOTE: all labs that were ordered this encounter are listed, however only abnormal results are displayed. Labs Reviewed  BASIC METABOLIC PANEL - Abnormal; Notable for the following components:      Result Value   Sodium 133 (*)    Potassium 3.4 (*)    CO2 21 (*)    Glucose, Bld 103 (*)    BUN 23 (*)    All other components within normal limits    CBC  URINALYSIS, COMPLETE (UACMP) WITH MICROSCOPIC    EKG: -None  RADIOLOGY: No results found.  PROCEDURES: Procedures  MEDICATIONS RECEIVED THIS VISIT: Medications - No data to display  PERTINENT CLINICAL COURSE NOTES/UPDATES:   Initial Impression / Assessment and Plan / Urgent Care Course:  Pertinent labs & imaging results that were available during my care of the patient were personally reviewed by me and considered in my medical decision making (see lab/imaging section of note for values and interpretations).  KRISTABELLE HARKEN is a 54 y.o. female who presents to [facility] today with complaints of Dizziness  Patient is well appearing overall in clinic today. She does not appear to be in any acute distress. Presenting symptoms (see HPI) and exam as documented above. Exam benign overall. No focal neurological concerns following recent TIA; no weakness, headaches, AMS, or ataxia. Energy has been low. (+) nausea without vomiting. Recent hypokalemia. Oral intake adequate. (+) recent dietary changes in efforts to reduce Na+ and fat intake. Patient advising that she was sent by PCP today for further evaluation of possible dehydration. Will pursue workup as follows:   CBC reveals a normal WBC of 010.5 K/uL. Hemoglobin 13.2, hematocrit 38.7, MCV 81.8, MCH 27.9, and platelets 323,000.    Na+ 133, K+ 2.4, glucose 103. BUN 23 and creatinine 0.84 mg/dL. Estimated Creatinine Clearance: 68.1 mL/min (by C-G formula based on SCr of 0.84 mg/dL). GFR > 60 mL/min.   UA negative for infection; 0-5 WBC/hpf, 0 RBC/hpf, with no nitrites, LE, or bacteria.  Results reviewed with patient. BUN minimally elevated with normal creatine. No orthostasis. BP normal at 106/77. No significant dehydration. Due to her dizziness, IVFs offered in order to help improve symptoms. Patient notes that she is making a conscious effort to eat and drink normally. Ultimately declined IVFs today. Discussed that etiology  of her symptoms are likely multi-factorial and include side effects from her clopidogrel and low blood pressure readings. Patient has other requests today regarding her medications. Will address and offer the following recommendations:   Anticoagulation therapy:  Continue clopidogrel as prescribed. Discussed dizziness is a known symptom. Patient only on medication x 3 weeks before she will transition to daily ASA monotherapy. Discussed change in administration time to later in the afternoon to prevent daytime dizziness that are ultimately affecting her function and quality of life. Anticipate improvement when clopidogrel is discontinued on/around 09/27/2019.    Patient is restricting her diet.  She is under the impression that she cannot consume green leafy vegetables, broccoli, and other vitamin K containing foods due to her anticoagulation therapy. Education provided regarding ability to liberalize diet to include the aforementioned foods. Discussed that restrictions that she mentioned were associated with warfarin, as vitamin K reverses the effects of warfarin. She verbalized understanding that it was acceptable for her to consume vitamin K containing foods.    Hypertension management:  DISCONTINUE thiazide diuretic dose. Na+ and K+ are just a little low; no supplementation required for current values. Blood pressures have been running low. Patient aware that following her TIA, strict blood pressure control is vital in the prevention of future TIA/CVA. PCP has established goal of maintaining blood pressures </= 120/80 mm/Hg, which is certainly reasonable. With that being said, recent 40# weight loss and dietary changes have resulted in patient's blood pressure readings being lower. Discontinuing the thiazide diuretic will correct the minor electrolyte derangements and allow for a slight increase in her SBPs, which in turn will hopefully improve her dizziness and make her feel better overall. This  should not cause major elevations in her blood pressure, and should allow for her to remain within the BP goals desired by her PCP.    She is asking to reduce ACEi dose, however this would not being in her best interest given the degree of protection that it stands to offer in the prevention of a diabetic nephropathy. Continue lisinopril 10 mg daily as previously prescribed.    Patient encouraged to monitor blood pressures daily and maintain a log for review by PCP. Continue lifestyle modifications.    Hyperlipidemia management:  Patient on daily rosuvastatin 40 mg for treatment of her HLD. FLP from 09/05/19 showed a total cholesterol of 202 (HDL 68 and LDL 118). Triglycerides 79 mg/dL. She feels as if her statin dose is too high, and is requesting to reduce to previous 10 mg dose. Discussed neurology recommendations following recent TIA, which include aggressive lipid management. Goal LDL is < 70 mg/dL. Patient to continue 40 mg dose as prescribed.   Discussed follow up with primary care physician as already scheduled for repeat labs and evaluation. I have reviewed the follow up and strict return precautions for any new or worsening symptoms, especially in the setting of her recent TIA. Patient is aware of symptoms that would be deemed urgent/emergent, and would thus require further evaluation either here or in the emergency department. At the time of discharge, she verbalized understanding and consent with the discharge plan as it was reviewed with her. All questions were fielded by provider and/or clinic staff prior to patient discharge.    Final Clinical Impressions / Urgent Care Diagnoses:   Final diagnoses:  Dizziness  Hyponatremia  Hypokalemia    New Prescriptions:  [place] Controlled Substance Registry consulted? Not Applicable  No orders of the defined types were placed in this encounter.   Recommended Follow up Care:  Patient encouraged to follow up with the following provider  within the specified time frame, or sooner as dictated by the severity of her symptoms. As always, she was instructed that for any urgent/emergent care needs, she should seek care either here or in the emergency department for more immediate evaluation.  Follow-up Information    Go to  Juline Patch, MD.   Specialty: Family Medicine Why: As already scheduled Contact information: [address] [place] [address] [phone]         NOTE: This note was  prepared using [employer] along with smaller Company secretary. Despite my best ability to proofread, there is the potential that transcriptional errors may still occur from this process, and are completely unintentional.    Karen Kitchens, NP 09/10/19 2359

## 2019-09-09 NOTE — ED Triage Notes (Signed)
Patient c/o dizziness that started Wed when she started her Plavix.  Patient states that she had a TIA on Monday and was seen and treated at [facility] ED.  Patient denies any pain.

## 2019-09-09 NOTE — Discharge Instructions (Addendum)
It was very nice seeing you today in clinic. Thank you for entrusting me with your care.   Rest and make sure you are staying hydrated. Your labs looked normal. Your BP is on the lower side. I think your symptoms are a combination of the Plavix and your blood pressure medication.   Continue Plavix and Lisinopril as prescribed.   DISCONTINUE the HCTZ for now to see if this improves your symptoms. Your sodium and potassium are a little low. This stands to improve with stopping the thiazide diuretic.   MONITOR you blood pressure at home. Keep a log and take with you when you see Dr. Ronnald Ramp.   If your symptoms/condition worsens, please seek follow up care either here or in the ER. Please remember, our [facility] providers are "right here with you" when you need [place].   Again, it was my pleasure to take care of you today. Thank you for choosing our clinic. I hope that you start to feel better quickly.   Honor Loh, MSN, APRN, FNP-C, CEN Advanced Practice Provider [facility]

## 2019-09-10 ENCOUNTER — Encounter: Payer: Self-pay | Admitting: Family Medicine

## 2019-09-12 ENCOUNTER — Other Ambulatory Visit: Payer: 59

## 2019-09-12 ENCOUNTER — Encounter: Payer: Self-pay | Admitting: Family Medicine

## 2019-09-12 DIAGNOSIS — E871 Hypo-osmolality and hyponatremia: Secondary | ICD-10-CM | POA: Diagnosis not present

## 2019-09-12 DIAGNOSIS — R69 Illness, unspecified: Secondary | ICD-10-CM

## 2019-09-12 DIAGNOSIS — E7849 Other hyperlipidemia: Secondary | ICD-10-CM | POA: Diagnosis not present

## 2019-09-12 DIAGNOSIS — E876 Hypokalemia: Secondary | ICD-10-CM | POA: Diagnosis not present

## 2019-09-13 ENCOUNTER — Other Ambulatory Visit: Payer: Self-pay

## 2019-09-13 LAB — LIPID PANEL WITH LDL/HDL RATIO
Cholesterol, Total: 204 mg/dL — ABNORMAL HIGH (ref 100–199)
HDL: 68 mg/dL (ref >39)
LDL Chol Calc (NIH): 123 mg/dL — ABNORMAL HIGH (ref 0–99)
LDL/HDL Ratio: 1.8 ratio (ref 0.0–3.2)
Triglycerides: 71 mg/dL (ref 0–149)
VLDL Cholesterol Cal: 13 mg/dL (ref 5–40)

## 2019-09-13 LAB — RENAL FUNCTION PANEL
Albumin: 4.9 g/dL (ref 3.8–4.9)
BUN/Creatinine Ratio: 20 (ref 9–23)
BUN: 15 mg/dL (ref 6–24)
CO2: 22 mmol/L (ref 20–29)
Calcium: 10.1 mg/dL (ref 8.7–10.2)
Chloride: 100 mmol/L (ref 96–106)
Creatinine, Ser: 0.74 mg/dL (ref 0.57–1.00)
GFR calc Af Amer: 105 mL/min/{1.73_m2} (ref >59)
GFR calc non Af Amer: 91 mL/min/{1.73_m2} (ref >59)
Glucose: 98 mg/dL (ref 65–99)
Phosphorus: 4 mg/dL (ref 3.0–4.3)
Potassium: 4.3 mmol/L (ref 3.5–5.2)
Sodium: 140 mmol/L (ref 134–144)

## 2019-09-13 LAB — HEPATIC FUNCTION PANEL
ALT: 9 IU/L (ref 0–32)
AST: 15 IU/L (ref 0–40)
Alkaline Phosphatase: 61 IU/L (ref 39–117)
Bilirubin Total: 0.4 mg/dL (ref 0.0–1.2)
Bilirubin, Direct: 0.09 mg/dL (ref 0.00–0.40)
Total Protein: 8 g/dL (ref 6.0–8.5)

## 2019-09-14 DIAGNOSIS — I10 Essential (primary) hypertension: Secondary | ICD-10-CM | POA: Diagnosis not present

## 2019-09-14 DIAGNOSIS — I208 Other forms of angina pectoris: Secondary | ICD-10-CM | POA: Diagnosis not present

## 2019-09-14 DIAGNOSIS — R002 Palpitations: Secondary | ICD-10-CM | POA: Diagnosis not present

## 2019-09-14 DIAGNOSIS — R42 Dizziness and giddiness: Secondary | ICD-10-CM | POA: Diagnosis not present

## 2019-09-14 DIAGNOSIS — R0602 Shortness of breath: Secondary | ICD-10-CM | POA: Diagnosis not present

## 2019-09-18 ENCOUNTER — Ambulatory Visit: Payer: 59 | Admitting: Family Medicine

## 2019-09-18 ENCOUNTER — Other Ambulatory Visit: Payer: Self-pay

## 2019-09-18 ENCOUNTER — Encounter: Payer: Self-pay | Admitting: Family Medicine

## 2019-09-18 VITALS — BP 124/80 | HR 80 | Ht 65.0 in | Wt 142.0 lb

## 2019-09-18 DIAGNOSIS — I679 Cerebrovascular disease, unspecified: Secondary | ICD-10-CM

## 2019-09-18 DIAGNOSIS — I10 Essential (primary) hypertension: Secondary | ICD-10-CM | POA: Diagnosis not present

## 2019-09-18 DIAGNOSIS — E785 Hyperlipidemia, unspecified: Secondary | ICD-10-CM

## 2019-09-18 DIAGNOSIS — H8110 Benign paroxysmal vertigo, unspecified ear: Secondary | ICD-10-CM | POA: Diagnosis not present

## 2019-09-18 MED FILL — OMRON 3 SERIES BP MONITOR D: 20 days supply | Qty: 1 | Fill #0

## 2019-09-18 NOTE — Progress Notes (Signed)
Date:  09/18/2019   Name:  Alyssa Schultz   DOB:  1966-04-09   MRN:  [id number]   Chief Complaint: Follow-up (was seen by cardiology- had holter monitor that she is supposed to get back results on. She is still off HCTZ, but doing well. ) and Ear Problem (gets dizzy occasionally, taking meclizine and it helps- wants her ears to be looked at)  Hypertension This is a chronic problem. The current episode started more than 1 year ago. The problem has been gradually improving since onset. The problem is controlled (attempting to decrease to <120/80). Pertinent negatives include no anxiety, blurred vision, chest pain, headaches, malaise/fatigue, neck pain, orthopnea, palpitations, peripheral edema, PND, shortness of breath or sweats. There are no associated agents to hypertension. There are no known risk factors for coronary artery disease. Past treatments include ACE inhibitors (contemplating reuming hctz at 12.5 on 10th). The current treatment provides moderate improvement. There are no compliance problems.  There is no history of angina, kidney disease, CAD/MI, CVA, heart failure, left ventricular hypertrophy, PVD or retinopathy. There is no history of chronic renal disease, a hypertension causing med or renovascular disease.  Hyperlipidemia This is a chronic problem. The current episode started more than 1 year ago. The problem is controlled. Recent lipid tests were reviewed and are normal. She has no history of chronic renal disease, diabetes, hypothyroidism, liver disease, obesity or nephrotic syndrome. Factors aggravating her hyperlipidemia include thiazides. Pertinent negatives include no chest pain, focal sensory loss, focal weakness, myalgias or shortness of breath. Current antihyperlipidemic treatment includes statins (crestor). The current treatment provides moderate improvement of lipids. There are no compliance problems.   Neurologic Problem The patient's pertinent negatives include no altered  mental status, clumsiness, focal sensory loss, focal weakness, loss of balance, memory loss, near-syncope, slurred speech, syncope, visual change or weakness. This is a chronic problem. The current episode started more than 1 year ago. The neurological problem developed gradually. The problem has been waxing and waning since onset. Associated symptoms include dizziness. Pertinent negatives include no abdominal pain, auditory change, back pain, chest pain, confusion, diaphoresis, fatigue, fever, headaches, light-headedness, nausea, neck pain, palpitations, shortness of breath or vomiting. There is no history of liver disease.  Dizziness This is a recurrent problem. The current episode started 1 to 4 weeks ago. The problem occurs daily (to some degree). The problem has been waxing and waning. Pertinent negatives include no abdominal pain, arthralgias, chest pain, chills, congestion, coughing, diaphoresis, fatigue, fever, headaches, myalgias, nausea, neck pain, numbness, rash, sore throat, visual change, vomiting or weakness. Exacerbated by: turn head.    Lab Results  Component Value Date   CREATININE 0.74 09/12/2019   BUN 15 09/12/2019   NA 140 09/12/2019   K 4.3 09/12/2019   CL 100 09/12/2019   CO2 22 09/12/2019   Lab Results  Component Value Date   CHOL 204 (H) 09/12/2019   HDL 68 09/12/2019   LDLCALC 123 (H) 09/12/2019   LDLDIRECT 153.0 10/02/2013   TRIG 71 09/12/2019   CHOLHDL 3.0 09/05/2019   Lab Results  Component Value Date   TSH 2.446 12/07/2017   Lab Results  Component Value Date   HGBA1C 6.1 (H) 09/05/2019     Review of Systems  Constitutional: Negative.  Negative for chills, diaphoresis, fatigue, fever, malaise/fatigue and unexpected weight change.  HENT: Negative for congestion, ear discharge, ear pain, rhinorrhea, sinus pressure, sneezing and sore throat.   Eyes: Negative for blurred vision, photophobia, pain,  discharge, redness and itching.  Respiratory: Negative for  cough, shortness of breath, wheezing and stridor.   Cardiovascular: Negative for chest pain, palpitations, orthopnea, PND and near-syncope.  Gastrointestinal: Negative for abdominal pain, blood in stool, constipation, diarrhea, nausea and vomiting.  Endocrine: Negative for cold intolerance, heat intolerance, polydipsia, polyphagia and polyuria.  Genitourinary: Negative for dysuria, flank pain, frequency, hematuria, menstrual problem, pelvic pain, urgency, vaginal bleeding and vaginal discharge.  Musculoskeletal: Negative for arthralgias, back pain, myalgias and neck pain.  Skin: Negative for rash.  Allergic/Immunologic: Negative for environmental allergies and food allergies.  Neurological: Positive for dizziness. Negative for focal weakness, syncope, weakness, light-headedness, numbness, headaches and loss of balance.  Hematological: Negative for adenopathy. Does not bruise/bleed easily.  Psychiatric/Behavioral: Negative for confusion, dysphoric mood and memory loss. The patient is not nervous/anxious.     Patient Active Problem List   Diagnosis Date Noted  . TIA (transient ischemic attack) 09/05/2019  . Radiculopathy 07/26/2019  . Leg pain 02/25/2018  . Edema 02/25/2018  . Hyperlipidemia 12/10/2017  . Screening for breast cancer 06/03/2017  . Screen for colon cancer 06/03/2017  . Bronchitis 12/03/2016  . Left hip pain 06/04/2016  . Routine general medical examination at a health care facility 03/04/2016  . 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 Allergic rhinitis 10/14/2012  . Hypertension 11/09/2011  . Rash 10/01/2011  . Diabetes mellitus type 2, controlled ([place]) 05/21/2011  . BMI 30.0-30.9,adult 05/21/2011    Allergies  Allergen Reactions  . Influenza Vaccine Live     Per Dr - Pt is to not receive vaccine   . Influenza Vaccines   . Hydrocodone Rash  . Morphine And Related Other (See Comments)    Makes her crazy  . Prednisone      "makes me crazy"    Past Surgical History:  Procedure Laterality Date  . abalsion    . ablation    . ANTERIOR CERVICAL DECOMP/DISCECTOMY FUSION N/A 09/19/2015   Procedure: ANTERIOR CERVICAL DECOMPRESSION/DISCECTOMY FUSION 2 LEVELS;  Surgeon: Phylliss Bob, MD;  Location: [facility];  Service: Orthopedics;  Laterality: N/A;  Anterior cervical decompression fusion, cervical 3-4, cervical 4-5 decompression with instrumentation and allograft  . BACK SURGERY    . FACIAL RECONSTRUCTION SURGERY    . TRANSFORAMINAL LUMBAR INTERBODY FUSION (TLIF) WITH PEDICLE SCREW FIXATION 2 LEVEL Right 07/26/2019   Procedure: RIGHT-SIDED LUMBAR 5 - SACRUM 1 TRANSFORAMINAL LUMBAR INTERBODY FUSION WITH INSTRUMENTATION AND ALLOGRAFT;  Surgeon: Phylliss Bob, MD;  Location: [facility];  Service: Orthopedics;  Laterality: Right;    Social History   Tobacco Use  . Smoking status: Never Smoker  . Smokeless tobacco: Never Used  Substance Use Topics  . Alcohol use: Yes    Comment: Occasional wine  . Drug use: No     Medication list has been reviewed and updated.  Current Meds  Medication Sig  . Accu-Chek FastClix Lancets MISC USE TO TEST ONCE DAILY  . aspirin EC 81 MG EC tablet Take 1 tablet (81 mg total) by mouth daily.  . clopidogrel (PLAVIX) 75 MG tablet Take 1 tablet (75 mg total) by mouth daily.  Marland Kitchen glucose blood (ACCU-CHEK GUIDE) test strip Accu-Chek Guide strips  . lisinopril (ZESTRIL) 10 MG tablet Take 1 tablet (10 mg total) by mouth daily. (Patient taking differently: Take 10 mg by mouth at bedtime. )  . meclizine (ANTIVERT) 50 MG tablet Take 1 tablet (50 mg total) by mouth 3 (three) times daily as needed for dizziness.  Marland Kitchen  metFORMIN (GLUCOPHAGE) 500 MG tablet TAKE 1 TABLET BY MOUTH 2 TIMES DAILY WITH A MEAL (Patient taking differently: Take 500 mg by mouth 3 (three) times daily. TAKE 1 TABLET BY MOUTH 3 TIMES DAILY WITH A MEAL/ Dr Honor Junes)  . nystatin cream (MYCOSTATIN) Apply 1 application topically 2 (two) times  daily.  . ondansetron (ZOFRAN) 4 MG tablet Take 1 tablet (4 mg total) by mouth every 8 (eight) hours as needed for nausea or vomiting.  . rosuvastatin (CRESTOR) 40 MG tablet Take 1 tablet (40 mg total) by mouth daily.  . saxagliptin HCl (ONGLYZA) 5 MG TABS tablet Take 1 tablet (5 mg total) by mouth daily.  . TRUEPLUS LANCETS 30G MISC USE AS DIRECTED  . valACYclovir (VALTREX) 500 MG tablet Take 500 mg by mouth as needed.    PHQ 2/9 Scores 06/01/2019 04/21/2018 12/23/2017 06/03/2017  PHQ - 2 Score 0 0 0 0  PHQ- 9 Score 0 0 - -    BP Readings from Last 3 Encounters:  09/18/19 124/80  09/09/19 106/77  09/07/19 110/62    Physical Exam Constitutional:      General: She is not in acute distress.    Appearance: She is not diaphoretic.  HENT:     Head: Normocephalic and atraumatic.     Right Ear: External ear normal.     Left Ear: External ear normal.     Nose: Nose normal.     Mouth/Throat:     Mouth: Mucous membranes are dry.  Eyes:     General:        Right eye: No discharge.        Left eye: No discharge.     Conjunctiva/sclera: Conjunctivae normal.     Pupils: Pupils are equal, round, and reactive to light.  Neck:     Thyroid: No thyromegaly.     Vascular: No JVD.  Cardiovascular:     Rate and Rhythm: Normal rate and regular rhythm.     Heart sounds: Normal heart sounds. No murmur. No friction rub. No gallop.   Pulmonary:     Effort: Pulmonary effort is normal.     Breath sounds: Normal breath sounds. No wheezing or rhonchi.  Abdominal:     General: Bowel sounds are normal.     Palpations: Abdomen is soft. There is no mass.     Tenderness: There is no abdominal tenderness. There is no guarding.  Musculoskeletal:        General: Normal range of motion.     Cervical back: Normal range of motion and neck supple.  Lymphadenopathy:     Cervical: No cervical adenopathy.  Skin:    General: Skin is warm and dry.     Capillary Refill: Capillary refill takes less than 2 seconds.   Neurological:     General: No focal deficit present.     Mental Status: She is alert.     Cranial Nerves: Cranial nerves are intact. No cranial nerve deficit or facial asymmetry.     Sensory: Sensation is intact.     Motor: Motor function is intact.     Deep Tendon Reflexes: Reflexes are normal and symmetric.     Reflex Scores:      Tricep reflexes are 2+ on the right side and 2+ on the left side.      Bicep reflexes are 2+ on the right side and 2+ on the left side.      Brachioradialis reflexes are 2+ on the right side and  2+ on the left side.      Patellar reflexes are 2+ on the right side and 2+ on the left side.      Achilles reflexes are 2+ on the right side and 2+ on the left side.    Wt Readings from Last 3 Encounters:  09/18/19 142 lb (64.4 kg)  09/09/19 143 lb (64.9 kg)  09/07/19 144 lb (65.3 kg)    BP 124/80   Pulse 80   Ht 5\' 5"  (1.651 m)   Wt 142 lb (64.4 kg)   LMP 07/24/2007 Comment: ablation in 2008-pt reports she went through menopause after this  BMI 23.63 kg/m   Assessment and Plan: Patient was not admitted to the hospital but was watched within 24 hours in the emergency room and was ago doing to her stability and no progression of her TIA. 1. Essential hypertension Chronic.  Controlled.  Stable.  Our goal was to get to the 120/70 range patient says she has much lower readings at home and but they are elevated at work.  Patient will continue lisinopril 10 mg once a day with the possibility of readding her hydrochlorothiazide but at a 12.5 mg dosing in 2 to 3 weeks.  We will recheck blood pressure in 6 weeks.  2. Hyperlipidemia, unspecified hyperlipidemia type Patient's goal is to be in the 70-80 range.  Patient is currently taking Crestor 40 mg once a day.  We will recheck lipid panel in 4 to 6 weeks.  3. Cerebrovascular disease Patient is currently on Plavix 75 mg daily and aspirin 81 mg once a day as of 3 weeks from her TIA patient will be discontinuing her  Plavix and will be continued continuing her aspirin.  Patient's had no resumption of her disease and is doing well with current therapy and understands her goals.  4. Benign paroxysmal positional vertigo, unspecified laterality Patient still has some episodes of positional vertigo particularly when she turns her head she does have the meclizine to take on a periodic basis.  We will continue to monitor this and if this should become more of a concern we will refer to ear nose and throat.

## 2019-09-18 NOTE — Patient Instructions (Signed)
Benign Positional Vertigo Vertigo is the feeling that you or your surroundings are moving when they are not. Benign positional vertigo is the most common form of vertigo. This is usually a harmless condition (benign). This condition is positional. This means that symptoms are triggered by certain movements and positions. This condition can be dangerous if it occurs while you are doing something that could cause harm to you or others. This includes activities such as driving or operating machinery. What are the causes? In many cases, the cause of this condition is not known. It may be caused by a disturbance in an area of the inner ear that helps your brain to sense movement and balance. This disturbance can be caused by:  Viral infection (labyrinthitis).  Head injury.  Repetitive motion, such as jumping, dancing, or running. What increases the risk? You are more likely to develop this condition if:  You are a woman.  You are 50 years of age or older. What are the signs or symptoms? Symptoms of this condition usually happen when you move your head or your eyes in different directions. Symptoms may start suddenly, and usually last for less than a minute. They include:  Loss of balance and falling.  Feeling like you are spinning or moving.  Feeling like your surroundings are spinning or moving.  Nausea and vomiting.  Blurred vision.  Dizziness.  Involuntary eye movement (nystagmus). Symptoms can be mild and cause only minor problems, or they can be severe and interfere with daily life. Episodes of benign positional vertigo may return (recur) over time. Symptoms may improve over time. How is this diagnosed? This condition may be diagnosed based on:  Your medical history.  Physical exam of the head, neck, and ears.  Tests, such as: ? MRI. ? CT scan. ? Eye movement tests. Your health care provider may ask you to change positions quickly while he or she watches you for symptoms  of benign positional vertigo, such as nystagmus. Eye movement may be tested with a variety of exams that are designed to evaluate or stimulate vertigo. ? An electroencephalogram (EEG). This records electrical activity in your brain. ? Hearing tests. You may be referred to a health care provider who specializes in ear, nose, and throat (ENT) problems (otolaryngologist) or a provider who specializes in disorders of the nervous system (neurologist). How is this treated?  This condition may be treated in a session in which your health care provider moves your head in specific positions to adjust your inner ear back to normal. Treatment for this condition may take several sessions. Surgery may be needed in severe cases, but this is rare. In some cases, benign positional vertigo may resolve on its own in 2-4 weeks. Follow these instructions at home: Safety  Move slowly. Avoid sudden body or head movements or certain positions, as told by your health care provider.  Avoid driving until your health care provider says it is safe for you to do so.  Avoid operating heavy machinery until your health care provider says it is safe for you to do so.  Avoid doing any tasks that would be dangerous to you or others if vertigo occurs.  If you have trouble walking or keeping your balance, try using a cane for stability. If you feel dizzy or unstable, sit down right away.  Return to your normal activities as told by your health care provider. Ask your health care provider what activities are safe for you. General instructions  Take over-the-counter   and prescription medicines only as told by your health care provider.  Drink enough fluid to keep your urine pale yellow.  Keep all follow-up visits as told by your health care provider. This is important. Contact a health care provider if:  You have a fever.  Your condition gets worse or you develop new symptoms.  Your family or friends notice any  behavioral changes.  You have nausea or vomiting that gets worse.  You have numbness or a "pins and needles" sensation. Get help right away if you:  Have difficulty speaking or moving.  Are always dizzy.  Faint.  Develop severe headaches.  Have weakness in your legs or arms.  Have changes in your hearing or vision.  Develop a stiff neck.  Develop sensitivity to light. Summary  Vertigo is the feeling that you or your surroundings are moving when they are not. Benign positional vertigo is the most common form of vertigo.  The cause of this condition is not known. It may be caused by a disturbance in an area of the inner ear that helps your brain to sense movement and balance.  Symptoms include loss of balance and falling, feeling that you or your surroundings are moving, nausea and vomiting, and blurred vision.  This condition can be diagnosed based on symptoms, physical exam, and other tests, such as MRI, CT scan, eye movement tests, and hearing tests.  Follow safety instructions as told by your health care provider. You will also be told when to contact your health care provider in case of problems. This information is not intended to replace advice given to you by your health care provider. Make sure you discuss any questions you have with your health care provider. Document Revised: 01/12/2018 Document Reviewed: 01/12/2018 Elsevier Patient Education  2020 [employer]  

## 2019-09-19 MED FILL — FREESTYLE LANCETS: 90 days supply | Qty: 200 | Fill #0

## 2019-09-21 DIAGNOSIS — M5030 Other cervical disc degeneration, unspecified cervical region: Secondary | ICD-10-CM | POA: Diagnosis not present

## 2019-09-21 DIAGNOSIS — M5417 Radiculopathy, lumbosacral region: Secondary | ICD-10-CM | POA: Diagnosis not present

## 2019-09-21 DIAGNOSIS — G459 Transient cerebral ischemic attack, unspecified: Secondary | ICD-10-CM | POA: Diagnosis not present

## 2019-09-21 DIAGNOSIS — G5602 Carpal tunnel syndrome, left upper limb: Secondary | ICD-10-CM | POA: Diagnosis not present

## 2019-09-24 ENCOUNTER — Encounter: Payer: Self-pay | Admitting: Family Medicine

## 2019-09-25 ENCOUNTER — Other Ambulatory Visit: Payer: Self-pay

## 2019-09-25 DIAGNOSIS — E785 Hyperlipidemia, unspecified: Secondary | ICD-10-CM

## 2019-09-25 MED ORDER — ROSUVASTATIN CALCIUM 40 MG PO TABS: 40.0000 mg | ORAL_TABLET | Freq: Every day | ORAL | 0 refills | Status: DC

## 2019-09-26 DIAGNOSIS — R002 Palpitations: Secondary | ICD-10-CM | POA: Diagnosis not present

## 2019-09-26 MED FILL — ONGLYZA 5 MG TABLET: 5 | 90 days supply | Qty: 90 | Fill #0

## 2019-09-28 ENCOUNTER — Encounter: Payer: Self-pay | Admitting: Family Medicine

## 2019-10-02 ENCOUNTER — Telehealth: Payer: Self-pay | Admitting: Family Medicine

## 2019-10-02 NOTE — Telephone Encounter (Signed)
Patient is feeling lightheaded every day and would like to know if she needs to have any blood work done to possibly check for low potassium levels.

## 2019-10-03 NOTE — Telephone Encounter (Signed)
Did u contact the patient ?

## 2019-10-03 NOTE — Telephone Encounter (Signed)
YES

## 2019-10-04 NOTE — Telephone Encounter (Signed)
See note

## 2019-10-06 ENCOUNTER — Ambulatory Visit: Payer: 59 | Admitting: Family Medicine

## 2019-10-06 ENCOUNTER — Encounter: Payer: Self-pay | Admitting: Family Medicine

## 2019-10-09 ENCOUNTER — Other Ambulatory Visit: Payer: Self-pay

## 2019-10-09 ENCOUNTER — Ambulatory Visit (INDEPENDENT_AMBULATORY_CARE_PROVIDER_SITE_OTHER): Payer: 59 | Admitting: Family Medicine

## 2019-10-09 ENCOUNTER — Encounter: Payer: Self-pay | Admitting: Family Medicine

## 2019-10-09 VITALS — BP 120/80 | HR 72 | Ht 65.0 in | Wt 143.0 lb

## 2019-10-09 DIAGNOSIS — B001 Herpesviral vesicular dermatitis: Secondary | ICD-10-CM | POA: Diagnosis not present

## 2019-10-09 DIAGNOSIS — M48061 Spinal stenosis, lumbar region without neurogenic claudication: Secondary | ICD-10-CM | POA: Diagnosis not present

## 2019-10-09 MED ORDER — VALACYCLOVIR HCL 500 MG PO TABS: 1000.0000 mg | ORAL_TABLET | Freq: Two times a day (BID) | ORAL | 1 refills | Status: AC

## 2019-10-09 MED FILL — traMADol HCL 50 MG TABS: 50 | 20 days supply | Qty: 60 | Fill #0

## 2019-10-09 MED FILL — VALACYCLOVIR HCL 500 MG TAB: 500 | 3 days supply | Qty: 12 | Fill #0

## 2019-10-09 NOTE — Progress Notes (Signed)
Date:  10/09/2019   Name:  Alyssa Schultz   DOB:  April 07, 1966   MRN:  [id number]   Chief Complaint: Hypertension (recheck b/p after stopping b/p med) and Mouth Lesions (needs refill on valacyclovir)  Hypertension This is a chronic problem. The current episode started more than 1 year ago. The problem has been waxing and waning since onset. The problem is controlled. Pertinent negatives include no anxiety, blurred vision, chest pain, headaches, malaise/fatigue, neck pain, orthopnea, palpitations, peripheral edema, PND, shortness of breath or sweats. Risk factors for coronary artery disease include dyslipidemia. Past treatments include lifestyle changes. The current treatment provides moderate improvement. There are no compliance problems.  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 There is no history of kidney disease, CAD/MI, heart failure, left ventricular hypertrophy, PVD or retinopathy. There is no history of chronic renal disease, a hypertension causing med or renovascular disease.  Mouth Lesions  Episode onset: episodic. The problem occurs occasionally. The problem has been gradually improving. The problem is mild. Associated symptoms include mouth sores. Pertinent negatives include no orthopnea, no fever, no eye itching, no photophobia, no abdominal pain, no constipation, no diarrhea, no nausea, no vomiting, no congestion, no ear discharge, no ear pain, no headaches, no rhinorrhea, no sore throat, no stridor, no neck pain, no cough, no wheezing, no rash, no eye discharge, no eye pain and no eye redness.    Lab Results  Component Value Date   CREATININE 0.74 09/12/2019   BUN 15 09/12/2019   NA 140 09/12/2019   K 4.3 09/12/2019   CL 100 09/12/2019   CO2 22 09/12/2019   Lab Results  Component Value Date   CHOL 204 (H) 09/12/2019   HDL 68 09/12/2019   LDLCALC 123 (H) 09/12/2019   LDLDIRECT 153.0 10/02/2013   TRIG 71 09/12/2019   CHOLHDL 3.0 09/05/2019   Lab Results    Component Value Date   TSH 2.446 12/07/2017   Lab Results  Component Value Date   HGBA1C 6.1 (H) 09/05/2019     Review of Systems  Constitutional: Negative.  Negative for chills, fatigue, fever, malaise/fatigue and unexpected weight change.  HENT: Positive for mouth sores. Negative for congestion, ear discharge, ear pain, rhinorrhea, sinus pressure, sneezing and sore throat.   Eyes: Negative for blurred vision, photophobia, pain, discharge, redness and itching.  Respiratory: Negative for cough, shortness of breath, wheezing and stridor.   Cardiovascular: Negative for chest pain, palpitations, orthopnea and PND.  Gastrointestinal: Negative for abdominal pain, blood in stool, constipation, diarrhea, nausea and vomiting.  Endocrine: Negative for cold intolerance, heat intolerance, polydipsia, polyphagia and polyuria.  Musculoskeletal: Negative for arthralgias, back pain, myalgias and neck pain.  Skin: Negative for rash.  Allergic/Immunologic: Negative for environmental allergies and food allergies.  Neurological: Negative for dizziness, weakness, light-headedness, numbness and headaches.  Hematological: Negative for adenopathy. Does not bruise/bleed easily.  Psychiatric/Behavioral: Negative for dysphoric mood. The patient is not nervous/anxious.     Patient Active Problem List   Diagnosis Date Noted  . TIA (transient ischemic attack) 09/05/2019  . Radiculopathy 07/26/2019  . Leg pain 02/25/2018  . Edema 02/25/2018  . Hyperlipidemia 12/10/2017  . Screening for breast cancer 06/03/2017  . Screen for colon cancer 06/03/2017  . Bronchitis 12/03/2016  . Left hip pain 06/04/2016  . Routine general medical examination at a health care facility 03/04/2016  . 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 Allergic rhinitis 10/14/2012  . Hypertension 11/09/2011  .  Rash 10/01/2011  . Diabetes mellitus type 2, controlled ([place]) 05/21/2011  . BMI  30.0-30.9,adult 05/21/2011    Allergies  Allergen Reactions  . Influenza Vaccine Live     Per Dr - Pt is to not receive vaccine   . Influenza Vaccines   . Hydrocodone Rash  . Morphine And Related Other (See Comments)    Makes her crazy  . Prednisone     "makes me crazy"    Past Surgical History:  Procedure Laterality Date  . abalsion    . ablation    . ANTERIOR CERVICAL DECOMP/DISCECTOMY FUSION N/A 09/19/2015   Procedure: ANTERIOR CERVICAL DECOMPRESSION/DISCECTOMY FUSION 2 LEVELS;  Surgeon: Phylliss Bob, MD;  Location: [place];  Service: Orthopedics;  Laterality: N/A;  Anterior cervical decompression fusion, cervical 3-4, cervical 4-5 decompression with instrumentation and allograft  . BACK SURGERY    . FACIAL RECONSTRUCTION SURGERY    . TRANSFORAMINAL LUMBAR INTERBODY FUSION (TLIF) WITH PEDICLE SCREW FIXATION 2 LEVEL Right 07/26/2019   Procedure: RIGHT-SIDED LUMBAR 5 - SACRUM 1 TRANSFORAMINAL LUMBAR INTERBODY FUSION WITH INSTRUMENTATION AND ALLOGRAFT;  Surgeon: Phylliss Bob, MD;  Location: [place];  Service: Orthopedics;  Laterality: Right;    Social History   Tobacco Use  . Smoking status: Never Smoker  . Smokeless tobacco: Never Used  Substance Use Topics  . Alcohol use: Yes    Comment: Occasional wine  . Drug use: No     Medication list has been reviewed and updated.  Current Meds  Medication Sig  . Accu-Chek FastClix Lancets MISC USE TO TEST ONCE DAILY  . aspirin EC 81 MG EC tablet Take 1 tablet (81 mg total) by mouth daily.  Marland Kitchen glucose blood (ACCU-CHEK GUIDE) test strip Accu-Chek Guide strips  . meclizine (ANTIVERT) 50 MG tablet Take 1 tablet (50 mg total) by mouth 3 (three) times daily as needed for dizziness.  . metFORMIN (GLUCOPHAGE) 500 MG tablet TAKE 1 TABLET BY MOUTH 2 TIMES DAILY WITH A MEAL (Patient taking differently: Take 500 mg by mouth 3 (three) times daily. TAKE 1 TABLET BY MOUTH 3 TIMES DAILY WITH A MEAL/ Dr Honor Junes)  . rosuvastatin (CRESTOR) 40 MG  tablet Take 1 tablet (40 mg total) by mouth daily.  . saxagliptin HCl (ONGLYZA) 5 MG TABS tablet Take 1 tablet (5 mg total) by mouth daily.  . TRUEPLUS LANCETS 30G MISC USE AS DIRECTED  . valACYclovir (VALTREX) 500 MG tablet Take 500 mg by mouth as needed.    PHQ 2/9 Scores 10/09/2019 06/01/2019 04/21/2018 12/23/2017  PHQ - 2 Score 0 0 0 0  PHQ- 9 Score 0 0 0 -    BP Readings from Last 3 Encounters:  10/09/19 120/80  09/18/19 124/80  09/09/19 106/77    Physical Exam Vitals and nursing note reviewed.  Constitutional:      General: Alyssa Schultz is not in acute distress.    Appearance: Alyssa Schultz is not diaphoretic.  HENT:     Head: Normocephalic and atraumatic.     Right Ear: Tympanic membrane, ear canal and external ear normal.     Left Ear: Tympanic membrane, ear canal and external ear normal.     Nose: Nose normal. No congestion or rhinorrhea.     Mouth/Throat:     Mouth: Mucous membranes are moist.  Eyes:     General:        Right eye: No discharge.        Left eye: No discharge.     Conjunctiva/sclera: Conjunctivae normal.  Pupils: Pupils are equal, round, and reactive to light.  Neck:     Thyroid: No thyromegaly.     Vascular: No JVD.  Cardiovascular:     Rate and Rhythm: Normal rate and regular rhythm.     Heart sounds: Normal heart sounds. No murmur. No friction rub. No gallop.   Pulmonary:     Effort: Pulmonary effort is normal.     Breath sounds: Normal breath sounds. No wheezing, rhonchi or rales.  Chest:     Chest wall: No tenderness.  Abdominal:     General: Bowel sounds are normal.     Palpations: Abdomen is soft. There is no mass.     Tenderness: There is no abdominal tenderness. There is no guarding.  Musculoskeletal:        General: Normal range of motion.     Cervical back: Normal range of motion and neck supple.  Lymphadenopathy:     Cervical: No cervical adenopathy.  Skin:    General: Skin is warm and dry.  Neurological:     Mental Status: Alyssa Schultz is alert.      Deep Tendon Reflexes: Reflexes are normal and symmetric.     Wt Readings from Last 3 Encounters:  10/09/19 143 lb (64.9 kg)  09/18/19 142 lb (64.4 kg)  09/09/19 143 lb (64.9 kg)    BP 120/80   Pulse 72   Ht 5\' 5"  (1.651 m)   Wt 143 lb (64.9 kg)   LMP 07/24/2007 Comment: ablation in 2008-pt reports Alyssa Schultz went through menopause after this  BMI 23.80 kg/m   Assessment and Plan:   1. Herpes labialis Episodic.  Stable.  Chronic.  Patient with occasional cold sore of upper of lower lip about twice a year.  Will initiate at 2 tablets 1 g twice a day and may repeat on the second day if necessary. - valACYclovir (VALTREX) 500 MG tablet; Take 2 tablets (1,000 mg total) by mouth 2 (two) times daily. Take prn cold sore on on day  Dispense: 12 tablet; Refill: 1  Patient is followed by Dr. Clayborn Bigness for stable angina at rest.  Currently Alyssa Schultz is not taking any lisinopril but this will be reviewed with Dr. Clayborn Bigness as to whether we will continue with a low-dose lisinopril given her history of diabetes.  Patient has read about the possibility of statin causing a lower blood pressure but I have encouraged the patient to listen to Dr. Clayborn Bigness and continue to take said statin to keep blood LDL well below 99 and more in the area of 75 given her history of a TIA and anginal concerns.  Patient will continue to take her blood pressure on a once a day basis and to eat a heart healthy low-sodium diet.

## 2019-10-11 ENCOUNTER — Ambulatory Visit: Payer: 59 | Admitting: Family Medicine

## 2019-10-11 ENCOUNTER — Ambulatory Visit: Payer: 59 | Attending: Orthopedic Surgery | Admitting: Physical Therapy

## 2019-10-11 ENCOUNTER — Encounter: Payer: Self-pay | Admitting: Physical Therapy

## 2019-10-11 ENCOUNTER — Other Ambulatory Visit: Payer: Self-pay

## 2019-10-11 DIAGNOSIS — R002 Palpitations: Secondary | ICD-10-CM | POA: Diagnosis not present

## 2019-10-11 DIAGNOSIS — R29898 Other symptoms and signs involving the musculoskeletal system: Secondary | ICD-10-CM | POA: Diagnosis not present

## 2019-10-11 DIAGNOSIS — I952 Hypotension due to drugs: Secondary | ICD-10-CM | POA: Diagnosis not present

## 2019-10-11 DIAGNOSIS — R198 Other specified symptoms and signs involving the digestive system and abdomen: Secondary | ICD-10-CM | POA: Diagnosis not present

## 2019-10-11 DIAGNOSIS — I10 Essential (primary) hypertension: Secondary | ICD-10-CM | POA: Diagnosis not present

## 2019-10-11 DIAGNOSIS — I208 Other forms of angina pectoris: Secondary | ICD-10-CM | POA: Diagnosis not present

## 2019-10-11 DIAGNOSIS — M25473 Effusion, unspecified ankle: Secondary | ICD-10-CM | POA: Insufficient documentation

## 2019-10-11 MED FILL — LISINOPRIL 2.5 MG TABLET: 2.5 | 90 days supply | Qty: 90 | Fill #0

## 2019-10-11 MED FILL — FUROSEMIDE 20 MG TABS: 20 | 90 days supply | Qty: 45 | Fill #0

## 2019-10-11 NOTE — Patient Instructions (Signed)
Access Code: [email or web address]  URL: [email or web address]  Date: 10/11/2019  Prepared by: Dorcas Carrow   Exercises  Hooklying Transversus Abdominis Palpation - 10 reps - 1 sets - 10 seconds hold - 1x daily - 7x weekly  Supine Bridge - 10 reps - 2 sets - 10 seconds hold - 1x daily - 7x weekly  Wall Squat - 10 reps - 2 sets - 10 sec hold - 1x daily - 7x weekly  Dead Bug - 10 reps - 2 sets - 10 seconds hold - 1x daily - 7x weekly  Scapular Retraction with Resistance - 20 reps - 1 sets - 1x daily - 7x weekly

## 2019-10-11 NOTE — Therapy (Signed)
[facility] [address] [place], [place], [address] Phone: [phone]   Fax:  [phone]  Physical Therapy Evaluation  Patient Details  Name: Alyssa Schultz MRN: [id number] Date of Birth: [birth year] Referring Provider (PT): Phylliss Bob, MD   Encounter Date: 10/11/2019  PT End of Session - 10/11/19 1045    Visit Number  1    Number of Visits  5    Date for PT Re-Evaluation  11/08/19    Authorization - Visit Number  1    Authorization - Number of Visits  10    PT Start Time  [id number]    PT Stop Time  [id number]    PT Time Calculation (min)  45 min    Activity Tolerance  Patient tolerated treatment well;No increased pain    Behavior During Therapy  WFL for tasks assessed/performed       Past Medical History:  Diagnosis Date  . Complication of anesthesia    hard to wake in 2017, patient reports she is 30 lb lighter now  . Diabetes mellitus    dx 2014   just take pills  . Hyperlipidemia   . Hypertension   . Neuromuscular disorder [facility])     Past Surgical History:  Procedure Laterality Date  . abalsion    . ablation    . ANTERIOR CERVICAL DECOMP/DISCECTOMY FUSION N/A 09/19/2015   Procedure: ANTERIOR CERVICAL DECOMPRESSION/DISCECTOMY FUSION 2 LEVELS;  Surgeon: Phylliss Bob, MD;  Location: [place];  Service: Orthopedics;  Laterality: N/A;  Anterior cervical decompression fusion, cervical 3-4, cervical 4-5 decompression with instrumentation and allograft  . BACK SURGERY    . FACIAL RECONSTRUCTION SURGERY    . TRANSFORAMINAL LUMBAR INTERBODY FUSION (TLIF) WITH PEDICLE SCREW FIXATION 2 LEVEL Right 07/26/2019   Procedure: RIGHT-SIDED LUMBAR 5 - SACRUM 1 TRANSFORAMINAL LUMBAR INTERBODY FUSION WITH INSTRUMENTATION AND ALLOGRAFT;  Surgeon: Phylliss Bob, MD;  Location: [place];  Service: Orthopedics;  Laterality: Right;    There were no vitals filed for this visit.   Subjective Assessment - 10/11/19 1038    Subjective  Pt. ambulated in the  PT department 12 weeks s/p L5/SI LTIF surgery with no complaints of pain or discomfort. Pt. c/o is going back to work which will require increased bending and lifting and wants to ensure that she is doing that properly. Pt. has some slight increased discomfort/stretching feeling in her R UE with walking and it is eased with bending her elbows while she walks. Pt. has some n/t in the R leg if she is standing on the R leg for >30 minutes and is relieved by moving out of the position. Pt. is highly active with walking at least 60 minutes each day and has been able to lose 40lbs over the last year.    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    Patient Stated Goals  Ensure proper lifting technique/ return to dancing    Currently in Pain?  No/denies         [facility] PT Assessment - 10/11/19 0001      Assessment   Medical Diagnosis  S/P L5/S1 LTIF    Referring Provider (PT)  Phylliss Bob, MD    Onset Date/Surgical Date  07/26/19    Hand Dominance  Right    Next MD Visit  11/20/19    Prior Therapy  No      Precautions   Precautions  None  Restrictions   Weight Bearing Restrictions  No      Balance Screen   Has the patient fallen in the past 6 months  No      [place] residence    [place]  Two level    Alternate Level Stairs-Number of Steps  14      Prior Function   Level of Independence  Independent      Cognition   Overall Cognitive Status  Within Functional Limits for tasks assessed        Subjective Current Episode: Pt. Had L5/S1 LTIF surgery on 07/26/2019 and has not experienced any pain since surgery. Pt. Will see the MD on 11/20/2019 for 4 month follow up. Pt. Was wearing a back brace after surgery but has discontinued brace usage. Chief Concern: Pt. Was to make sure that she is moving properly and safely when she returns to work next week. Pain: Pt. Has no  reports of back pain with movement. Pt. Experiences some R shoulder pain with walking but it is relieved with bending the elbows while walking. Pt. Has some increased R sided numbness and tingling with standing for prolonged periods of time on the R LE and is relieved by changing positions 24 Hr. Behavior: Pt. Reports no problems with sleeping and no increased pain throughout the day Leisure Activities: Walking >60 minutes each day Goals: Lift more than 5 lbs, dancing, running  Objective Observation - Posture: pt. Has increased rounded shoulders with standing and seated posture. Pt. Has decreased lumbar lordosis and slight increased thoracic kyphosis.  - Gait: Pt. Has normal gait mechanics and no complaints of back pain. Pt. Has some increased stretching sensation in shoulder with prolonged walking   Functional Task: Pt. Demonstrated proper lifting techniques with increased knee flexion while maintaining spine in neutral position  Active Range of Motion Lumbar Spine Flexion 30 deg Extension 10 deg Lateral flexion: WNL bilaterally *increased stretching in side body Rotation: WNL bilaterally   Hip  Flexion: L 115 deg R 115 deg IR: WNL bilaterally ER: WNL bilaterally  Manual Muscle Testing Hip flexion L 4+/5 R 4+/5 Hip Abduction L 4+/5 R 4+/5 Hip Adduction grossly 5/5 Knee Flexion 5/5 bilaterally Knee Extension 5/5 bilaterally Ankle DF 5/5 bilaterally   Muscle Length  Proximal Hamstring Length L 80 deg R 83 deg  Palpation No increased tenderness on the paraspinal musculature and glute medius/maximus  Special Tests Abdominal Drawing-In Maneuver (-): Pt. Was able to activate the TrA for 3 seconds but had troubled with maintaining for greater periods of time while maintaining normal breathing patterns      PT Education - 10/11/19 1044    Education Details  Pt. was educated on proper lifting techniques, abdominal drawing in maneuver with all activities, and postural exercises.     Person(s) Educated  Patient    Methods  Explanation;Demonstration;Verbal cues;Tactile cues;Handout    Comprehension  Verbalized understanding;Returned demonstration;Tactile cues required          PT Long Term Goals - 10/11/19 1046      PT LONG TERM GOAL #1   Title  Pt. will be able to maintain TrA muscle activation for >10 seconds in supine while maintaining normal breathing to improve TrA awareness during ADLs    Baseline  Pt. can maintain TrA activation for 5 seconds    Time  4    Period  Weeks    Status  New    Target Date  11/08/19      PT LONG TERM GOAL #2   Title  Pt. will be able to demonstrate proper lifting techniques with 5lbs to improve movement patterns for work-related activities    Baseline  Pt. is limited to lifting 5# due to precautions    Time  4    Period  Weeks    Status  New    Target Date  11/08/19      PT LONG TERM GOAL #3   Title  Pt. will improve FOTO score to >66 to show improvements with ADLs    Baseline  Score 63/Goal 66    Time  4    Period  Weeks    Status  New    Target Date  11/08/19      PT LONG TERM GOAL #4   Title  Pt. will be able to maintain scapular retraction and depression for >10 minutes to improve standing posture with work-related activities    Baseline  Pt. presents to the clinic with increased rounded shoulders and required verbal/visual mirror feedback for proper scapular retraction form.    Time  4    Period  Weeks    Status  New    Target Date  11/08/19             Plan - 10/11/19 1129    Clinical Impression Statement  Pt. is a pleasant 54 y/o s/p L5/S1 LTIF on 07/26/2019 who presented to the PT clinic today for concerns of maintaining proper lifting techniques when returning to work next week. Pt. Reports no pain with movements except for light stretching feeling in R UE when walking and prolonged standing on the R LE without changing positions brings on numbness and tingling in the R LE. Pt. Presents with normal  range of motion in the lumbar spine: flexion 30 deg, extension 10 deg, full lateral flexion and rotation with no increased pain. Pt. Has no deficits or pain with hip ROM. Pt. Presents with grossly 5/5 MMT with exception to hip flexion strength 4+/5. Pt. Has normal bilateral hamstring length: L 80 degs R 83 degs. Special tests: Abdominal Drawing-In Maneuver (-): Pt. Was able to activate the TrA for 3 seconds but had troubled with maintaining for greater periods of time while maintaining normal breathing patterns. Pt. Will benefit from PT services to ensure proper lifting techniques and to improve transverse abdominus activation to improve lumbar stabilization during work related activities.    Examination-Activity Limitations  Squat;Lift    Stability/Clinical Decision Making  Stable/Uncomplicated    Clinical Decision Making  Low    Rehab Potential  Excellent    PT Frequency  1x / week    PT Duration  4 weeks    PT Treatment/Interventions  ADLs/Self Care Home Management;Aquatic Therapy;Biofeedback;Cryotherapy;Electrical Stimulation;Gait training;Stair training;Functional mobility training;Therapeutic activities;Therapeutic exercise;Balance training;Patient/family education;Manual techniques    PT Next Visit Plan  Review HEP, maintain TrA activation with all activites, postural exercises    PT Home Exercise Plan  [email or web address]    Consulted and Agree with Plan of Care  Patient       Patient will benefit from skilled therapeutic intervention in order to improve the following deficits and impairments:  Improper body mechanics, Decreased strength, Decreased coordination, Postural dysfunction  Visit Diagnosis: Poor body mechanics  Abdominal weakness     Problem List Patient Active Problem List   Diagnosis Date Noted  . TIA (transient ischemic attack) 09/05/2019  . Radiculopathy  07/26/2019  . Leg pain 02/25/2018  . Edema 02/25/2018  . Hyperlipidemia 12/10/2017  . Screening for breast cancer  06/03/2017  . Screen for colon cancer 06/03/2017  . Bronchitis 12/03/2016  . Left hip pain 06/04/2016  . Routine general medical examination at a health care facility 03/04/2016  . 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 Allergic rhinitis 10/14/2012  . Hypertension 11/09/2011  . Rash 10/01/2011  . Diabetes mellitus type 2, controlled ([place]) 05/21/2011  . BMI 30.0-30.9,adult 05/21/2011   Pura Spice, PT, DPT # [id number] Andrey Campanile, SPT 10/11/2019, 11:59 AM  [facility] [address] [place], [place], [address] Phone: [phone]   Fax:  [phone]  Name: VIANEY AURICH MRN: [id number] Date of Birth: 08-02-1966

## 2019-10-11 NOTE — Therapy (Deleted)
[facility] [address] [place], [place], [address] Phone: [phone]   Fax:  [phone]  Physical Therapy Treatment  Patient Details  Name: Alyssa Schultz MRN: [id number] Date of Birth: 1966/04/23 Referring Provider (PT): Phylliss Bob, MD   Encounter Date: 10/11/2019  PT End of Session - 10/11/19 1045    Visit Number  1    Number of Visits  5    Date for PT Re-Evaluation  11/08/19    Authorization - Visit Number  1    Authorization - Number of Visits  10    PT Start Time  [id number]    PT Stop Time  [id number]    PT Time Calculation (min)  45 min    Activity Tolerance  Patient tolerated treatment well;No increased pain    Behavior During Therapy  WFL for tasks assessed/performed       Past Medical History:  Diagnosis Date  . Complication of anesthesia    hard to wake in 2017, patient reports she is 30 lb lighter now  . Diabetes mellitus    dx 2014   just take pills  . Hyperlipidemia   . Hypertension   . Neuromuscular disorder [facility])     Past Surgical History:  Procedure Laterality Date  . abalsion    . ablation    . ANTERIOR CERVICAL DECOMP/DISCECTOMY FUSION N/A 09/19/2015   Procedure: ANTERIOR CERVICAL DECOMPRESSION/DISCECTOMY FUSION 2 LEVELS;  Surgeon: Phylliss Bob, MD;  Location: [place];  Service: Orthopedics;  Laterality: N/A;  Anterior cervical decompression fusion, cervical 3-4, cervical 4-5 decompression with instrumentation and allograft  . BACK SURGERY    . FACIAL RECONSTRUCTION SURGERY    . TRANSFORAMINAL LUMBAR INTERBODY FUSION (TLIF) WITH PEDICLE SCREW FIXATION 2 LEVEL Right 07/26/2019   Procedure: RIGHT-SIDED LUMBAR 5 - SACRUM 1 TRANSFORAMINAL LUMBAR INTERBODY FUSION WITH INSTRUMENTATION AND ALLOGRAFT;  Surgeon: Phylliss Bob, MD;  Location: [place];  Service: Orthopedics;  Laterality: Right;    There were no vitals filed for this visit.  Subjective Assessment - 10/11/19 1038    Subjective  Pt. ambulated in the PT  department 12 weeks s/p L5/SI LTIF surgery with no complaints of pain or discomfort. Pt. c/o is going back to work which will require increased bending and lifting and wants to ensure that she is doing that properly. Pt. has some slight increased discomfort/stretching feeling in her R UE with walking and it is eased with bending her elbows while she walks. Pt. has some n/t in the R leg if she is standing on the R leg for >30 minutes and is relieved by moving out of the position. Pt. is highly active with walking at least 60 minutes each day and has been able to lose 40lbs over the last year.    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    Patient Stated Goals  Ensure proper lifting technique/ return to dancing    Currently in Pain?  No/denies         [facility] PT Assessment - 10/11/19 0001      Assessment   Medical Diagnosis  S/P L5/S1 LTIF    Referring Provider (PT)  Phylliss Bob, MD    Onset Date/Surgical Date  07/26/19    Hand Dominance  Right    Next MD Visit  11/20/19    Prior Therapy  No      Precautions   Precautions  None  Restrictions   Weight Bearing Restrictions  No      Balance Screen   Has the patient fallen in the past 6 months  No      [place] residence    [place]  Two level    Alternate Level Stairs-Number of Steps  14      Prior Function   Level of Independence  Independent      Cognition   Overall Cognitive Status  Within Functional Limits for tasks assessed       Subjective Current Episode: Pt. Had L5/S1 LTIF surgery on 07/26/2019 and has not experienced any pain since surgery. Pt. Will see the MD on 11/20/2019 for 4 month follow up. Pt. Was wearing a back brace after surgery but has discontinued brace usage. Chief Concern: Pt. Was to make sure that she is moving properly and safely when she returns to work next week. Pain: Pt. Has no reports  of back pain with movement. Pt. Experiences some R shoulder pain with walking but it is relieved with bending the elbows while walking. Pt. Has some increased R sided numbness and tingling with standing for prolonged periods of time on the R LE and is relieved by changing positions 24 Hr. Behavior: Pt. Reports no problems with sleeping and no increased pain throughout the day Leisure Activities: Walking >60 minutes each day Goals: Lift more than 5 lbs, dancing, running  Objective Observation - Posture: pt. Has increased rounded shoulders with standing and seated posture. Pt. Has decreased lumbar lordosis and slight increased thoracic kyphosis.  - Gait: Pt. Has normal gait mechanics and no complaints of back pain. Pt. Has some increased stretching sensation in shoulder with prolonged walking   Functional Task: Pt. Demonstrated proper lifting techniques with increased knee flexion while maintaining spine in neutral position  Active Range of Motion Lumbar Spine Flexion 30 deg Extension 10 deg Lateral flexion: WNL bilaterally *increased stretching in side body Rotation: WNL bilaterally   Hip  Flexion: L 115 deg R 115 deg IR: WNL bilaterally ER: WNL bilaterally  Manual Muscle Testing Hip flexion L 4+/5 R 4+/5 Hip Abduction L 4+/5 R 4+/5 Hip Adduction grossly 5/5 Knee Flexion 5/5 bilaterally Knee Extension 5/5 bilaterally Ankle DF 5/5 bilaterally   Muscle Length  Proximal Hamstring Length L 80 deg R 83 deg  Palpation No increased tenderness on the paraspinal musculature and glute medius/maximus  Special Tests Abdominal Drawing-In Maneuver (-): Pt. Was able to activate the TrA for 3 seconds but had troubled with maintaining for greater periods of time while maintaining normal breathing patterns    PT Education - 10/11/19 1044    Education Details  Pt. was educated on proper lifting techniques, abdominal drawing in maneuver with all activities, and postural exercises.     Person(s) Educated  Patient    Methods  Explanation;Demonstration;Verbal cues;Tactile cues;Handout    Comprehension  Verbalized understanding;Returned demonstration;Tactile cues required          PT Long Term Goals - 10/11/19 1046      PT LONG TERM GOAL #1   Title  Pt. will be able to maintain TrA muscle activation for >10 seconds in supine while maintaining normal breathing to improve TrA awareness during ADLs    Baseline  Pt. can maintain TrA activation for 5 seconds    Time  4    Period  Weeks    Status  New  Target Date  11/08/19      PT LONG TERM GOAL #2   Title  Pt. will be able to demonstrate proper lifting techniques with 5lbs to improve movement patterns for work-related activities    Baseline  Pt. is limited to lifting 5# due to precautions    Time  4    Period  Weeks    Status  New    Target Date  11/08/19      PT LONG TERM GOAL #3   Title  Pt. will improve FOTO score to >66 to show improvements with ADLs    Baseline  Score 63/Goal 66    Time  4    Period  Weeks    Status  New    Target Date  11/08/19      PT LONG TERM GOAL #4   Title  Pt. will be able to maintain scapular retraction and depression for >10 minutes to improve standing posture with work-related activities    Baseline  Pt. presents to the clinic with increased rounded shoulders and required verbal/visual mirror feedback for proper scapular retraction form.    Time  4    Period  Weeks    Status  New    Target Date  11/08/19       Patient will benefit from skilled therapeutic intervention in order to improve the following deficits and impairments:  Improper body mechanics, Decreased strength, Decreased coordination, Postural dysfunction  Visit Diagnosis: Poor body mechanics  Abdominal weakness     Problem List Patient Active Problem List   Diagnosis Date Noted  . TIA (transient ischemic attack) 09/05/2019  . Radiculopathy 07/26/2019  . Leg pain 02/25/2018  . Edema 02/25/2018  .  Hyperlipidemia 12/10/2017  . Screening for breast cancer 06/03/2017  . Screen for colon cancer 06/03/2017  . Bronchitis 12/03/2016  . Left hip pain 06/04/2016  . Routine general medical examination at a health care facility 03/04/2016  . 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 Allergic rhinitis 10/14/2012  . Hypertension 11/09/2011  . Rash 10/01/2011  . Diabetes mellitus type 2, controlled ([place]) 05/21/2011  . BMI 30.0-30.9,adult 05/21/2011    Dorcas Carrow C,SPT 10/11/2019, 11:59 AM  [facility] [address] [place], [place], [address] Phone: [phone]   Fax:  [phone]  Name: Alyssa Schultz MRN: [id number] Date of Birth: 1966/06/11

## 2019-10-12 DIAGNOSIS — Z1151 Encounter for screening for human papillomavirus (HPV): Secondary | ICD-10-CM | POA: Diagnosis not present

## 2019-10-12 DIAGNOSIS — Z01419 Encounter for gynecological examination (general) (routine) without abnormal findings: Secondary | ICD-10-CM | POA: Diagnosis not present

## 2019-10-12 DIAGNOSIS — Z6824 Body mass index (BMI) 24.0-24.9, adult: Secondary | ICD-10-CM | POA: Diagnosis not present

## 2019-10-13 ENCOUNTER — Other Ambulatory Visit
Admission: RE | Admit: 2019-10-13 | Discharge: 2019-10-13 | Disposition: A | Discharge status: Home / Self Care | Payer: 59 | Attending: Family Medicine | Admitting: Family Medicine

## 2019-10-13 ENCOUNTER — Other Ambulatory Visit: Payer: Self-pay

## 2019-10-13 DIAGNOSIS — I10 Essential (primary) hypertension: Secondary | ICD-10-CM | POA: Insufficient documentation

## 2019-10-13 DIAGNOSIS — E785 Hyperlipidemia, unspecified: Secondary | ICD-10-CM | POA: Diagnosis not present

## 2019-10-13 LAB — LIPID PANEL
Cholesterol: 156 mg/dL (ref 0–200)
HDL: 59 mg/dL (ref >40)
LDL Cholesterol: 87 mg/dL (ref 0–99)
Total CHOL/HDL Ratio: 2.6 RATIO
Triglycerides: 51 mg/dL (ref <150)
VLDL: 10 mg/dL (ref 0–40)

## 2019-10-13 LAB — HEMOGLOBIN A1C
Hgb A1c MFr Bld: 5.8 % — ABNORMAL HIGH (ref 4.8–5.6)
Mean Plasma Glucose: 119.76 mg/dL

## 2019-10-16 ENCOUNTER — Other Ambulatory Visit
Admission: RE | Admit: 2019-10-16 | Discharge: 2019-10-16 | Disposition: A | Discharge status: Home / Self Care | Payer: 59 | Attending: Family Medicine | Admitting: Family Medicine

## 2019-10-16 DIAGNOSIS — I10 Essential (primary) hypertension: Secondary | ICD-10-CM | POA: Insufficient documentation

## 2019-10-16 DIAGNOSIS — E785 Hyperlipidemia, unspecified: Secondary | ICD-10-CM | POA: Diagnosis not present

## 2019-10-16 LAB — RENAL FUNCTION PANEL
Albumin: 4.6 g/dL (ref 3.5–5.0)
Anion gap: 9 (ref 5–15)
BUN: 16 mg/dL (ref 6–20)
CO2: 25 mmol/L (ref 22–32)
Calcium: 9.2 mg/dL (ref 8.9–10.3)
Chloride: 103 mmol/L (ref 98–111)
Creatinine, Ser: 0.67 mg/dL (ref 0.44–1.00)
GFR calc Af Amer: >60 mL/min (ref >60)
GFR calc non Af Amer: >60 mL/min (ref >60)
Glucose, Bld: 103 mg/dL — ABNORMAL HIGH (ref 70–99)
Phosphorus: 3.6 mg/dL (ref 2.5–4.6)
Potassium: 3.8 mmol/L (ref 3.5–5.1)
Sodium: 137 mmol/L (ref 135–145)

## 2019-10-16 MED FILL — LOSARTAN POTASSIUM 25 MG TA: 25 | 30 days supply | Qty: 30 | Fill #0

## 2019-10-17 MED FILL — FREESTYLE LITE METER: 1 days supply | Qty: 1 | Fill #0

## 2019-10-17 MED FILL — FREESTYLE LITE TEST STRIP: 90 days supply | Qty: 200 | Fill #0

## 2019-10-18 ENCOUNTER — Ambulatory Visit: Payer: 59 | Attending: Orthopedic Surgery | Admitting: Physical Therapy

## 2019-10-18 ENCOUNTER — Encounter: Payer: Self-pay | Admitting: Physical Therapy

## 2019-10-18 ENCOUNTER — Other Ambulatory Visit: Payer: Self-pay

## 2019-10-18 DIAGNOSIS — R198 Other specified symptoms and signs involving the digestive system and abdomen: Secondary | ICD-10-CM | POA: Insufficient documentation

## 2019-10-18 DIAGNOSIS — R29898 Other symptoms and signs involving the musculoskeletal system: Secondary | ICD-10-CM | POA: Diagnosis not present

## 2019-10-18 NOTE — Therapy (Signed)
[facility] [address] [place], [place], [address] Phone: [phone]   Fax:  [phone]  Physical Therapy Treatment  Patient Details  Name: Alyssa Schultz MRN: [id number] Date of Birth: 04-06-1966 Referring Provider (PT): Phylliss Bob, MD   Encounter Date: 10/18/2019  PT End of Session - 10/18/19 1302    Visit Number  2    Number of Visits  5    Date for PT Re-Evaluation  11/08/19    Authorization - Visit Number  2    Authorization - Number of Visits  10    PT Start Time  [id number]    PT Stop Time  [id number]    PT Time Calculation (min)  54 min    Activity Tolerance  Patient tolerated treatment well;No increased pain    Behavior During Therapy  WFL for tasks assessed/performed       Past Medical History:  Diagnosis Date  . Complication of anesthesia    hard to wake in 2017, patient reports she is 30 lb lighter now  . Diabetes mellitus    dx 2014   just take pills  . Hyperlipidemia   . Hypertension   . Neuromuscular disorder [facility])     Past Surgical History:  Procedure Laterality Date  . abalsion    . ablation    . ANTERIOR CERVICAL DECOMP/DISCECTOMY FUSION N/A 09/19/2015   Procedure: ANTERIOR CERVICAL DECOMPRESSION/DISCECTOMY FUSION 2 LEVELS;  Surgeon: Phylliss Bob, MD;  Location: [place];  Service: Orthopedics;  Laterality: N/A;  Anterior cervical decompression fusion, cervical 3-4, cervical 4-5 decompression with instrumentation and allograft  . BACK SURGERY    . FACIAL RECONSTRUCTION SURGERY    . TRANSFORAMINAL LUMBAR INTERBODY FUSION (TLIF) WITH PEDICLE SCREW FIXATION 2 LEVEL Right 07/26/2019   Procedure: RIGHT-SIDED LUMBAR 5 - SACRUM 1 TRANSFORAMINAL LUMBAR INTERBODY FUSION WITH INSTRUMENTATION AND ALLOGRAFT;  Surgeon: Phylliss Bob, MD;  Location: [place];  Service: Orthopedics;  Laterality: Right;    There were no vitals filed for this visit.  Subjective Assessment - 10/18/19 1254    Subjective  Pt. states she has  returned to work and is doing okay but can tell she has returned to work.  Pt. reports standing for majority of 4 hour work day.    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    Patient Stated Goals  Ensure proper lifting technique/ return to dancing    Currently in Pain?  No/denies         There.ex.:  Discussed/ reviewed HEP Scifit L6 10 min. F/b with B UE/LE (warm-up) Supine TrA (issued handouts) TRX core muscle activation: sit to stands with chair touches 12x2. RTB walk outs with core activation/ UE to L/R (5x each). Issued RTB for supine hip abduction 20x    PT Long Term Goals - 10/11/19 1046      PT LONG TERM GOAL #1   Title  Pt. will be able to maintain TrA muscle activation for >10 seconds in supine while maintaining normal breathing to improve TrA awareness during ADLs    Baseline  Pt. can maintain TrA activation for 5 seconds    Time  4    Period  Weeks    Status  New    Target Date  11/08/19      PT LONG TERM GOAL #2   Title  Pt. will be able to demonstrate proper lifting techniques with 5lbs to improve movement  patterns for work-related activities    Baseline  Pt. is limited to lifting 5# due to precautions    Time  4    Period  Weeks    Status  New    Target Date  11/08/19      PT LONG TERM GOAL #3   Title  Pt. will improve FOTO score to >66 to show improvements with ADLs    Baseline  Score 63/Goal 66    Time  4    Period  Weeks    Status  New    Target Date  11/08/19      PT LONG TERM GOAL #4   Title  Pt. will be able to maintain scapular retraction and depression for >10 minutes to improve standing posture with work-related activities    Baseline  Pt. presents to the clinic with increased rounded shoulders and required verbal/visual mirror feedback for proper scapular retraction form.    Time  4    Period  Weeks    Status  New    Target Date  11/08/19            Plan - 10/18/19 1303    Clinical  Impression Statement  Pt. is highly motivated and progressing well with core based strengthening/ posture ex. program.  PT issued more progressive TrA ex. and pt. able to demonstrate with no limitations/pain.  Pt. demonstrates proper sit to stands/ floor to waist lifting technique and cuing provided to maintain core muscle activiation t/o movement.  Pt. demonstrates good supine lumbar rotn./ controlled movement pattern.    Examination-Activity Limitations  Squat;Lift    Stability/Clinical Decision Making  Stable/Uncomplicated    Clinical Decision Making  Low    Rehab Potential  Excellent    PT Frequency  1x / week    PT Duration  4 weeks    PT Treatment/Interventions  ADLs/Self Care Home Management;Aquatic Therapy;Biofeedback;Cryotherapy;Electrical Stimulation;Gait training;Stair training;Functional mobility training;Therapeutic activities;Therapeutic exercise;Balance training;Patient/family education;Manual techniques    PT Next Visit Plan  Review HEP, maintain TrA activation with all activites, postural exercises    PT Home Exercise Plan  [id number]    Consulted and Agree with Plan of Care  Patient       Patient will benefit from skilled therapeutic intervention in order to improve the following deficits and impairments:  Improper body mechanics, Decreased strength, Decreased coordination, Postural dysfunction  Visit Diagnosis: Poor body mechanics  Abdominal weakness     Problem List Patient Active Problem List   Diagnosis Date Noted  . TIA (transient ischemic attack) 09/05/2019  . Radiculopathy 07/26/2019  . Leg pain 02/25/2018  . Edema 02/25/2018  . Hyperlipidemia 12/10/2017  . Screening for breast cancer 06/03/2017  . Screen for colon cancer 06/03/2017  . Bronchitis 12/03/2016  . Left hip pain 06/04/2016  . Routine general medical examination at a health care facility 03/04/2016  . 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 Allergic rhinitis  10/14/2012  . Hypertension 11/09/2011  . Rash 10/01/2011  . Diabetes mellitus type 2, controlled ([place]) 05/21/2011  . BMI 30.0-30.9,adult 05/21/2011   Pura Spice, PT, DPT # [phone] 10/19/2019, 12:30 PM  [facility] [address] [place], [place], [address] Phone: [phone]   Fax:  [phone]  Name: Alyssa Schultz MRN: [id number] Date of Birth: [birth year]

## 2019-10-20 DIAGNOSIS — G459 Transient cerebral ischemic attack, unspecified: Secondary | ICD-10-CM | POA: Diagnosis not present

## 2019-10-24 ENCOUNTER — Encounter: Payer: Self-pay | Admitting: Family Medicine

## 2019-10-24 ENCOUNTER — Other Ambulatory Visit: Payer: Self-pay

## 2019-10-25 ENCOUNTER — Encounter: Payer: Self-pay | Admitting: Physical Therapy

## 2019-10-25 ENCOUNTER — Ambulatory Visit: Payer: 59 | Admitting: Physical Therapy

## 2019-10-25 ENCOUNTER — Other Ambulatory Visit: Payer: Self-pay

## 2019-10-25 DIAGNOSIS — R29898 Other symptoms and signs involving the musculoskeletal system: Secondary | ICD-10-CM

## 2019-10-25 DIAGNOSIS — R198 Other specified symptoms and signs involving the digestive system and abdomen: Secondary | ICD-10-CM | POA: Diagnosis not present

## 2019-10-25 NOTE — Therapy (Signed)
[facility] [address] [place], [place], [address] Phone: [phone]   Fax:  [phone]  Physical Therapy Treatment  Patient Details  Name: Alyssa Schultz MRN: [id number] Date of Birth: [birth year] Referring Provider (PT): Phylliss Bob, MD   Encounter Date: 10/25/2019  PT End of Session - 10/25/19 1304    Visit Number  3    Number of Visits  5    Date for PT Re-Evaluation  11/08/19    Authorization - Visit Number  3    Authorization - Number of Visits  10    PT Start Time  1252    PT Stop Time  [id number]    PT Time Calculation (min)  61 min    Activity Tolerance  Patient tolerated treatment well;No increased pain    Behavior During Therapy  WFL for tasks assessed/performed       Past Medical History:  Diagnosis Date  . Complication of anesthesia    hard to wake in 2017, patient reports she is 30 lb lighter now  . Diabetes mellitus    dx 2014   just take pills  . Hyperlipidemia   . Hypertension   . Neuromuscular disorder [facility])     Past Surgical History:  Procedure Laterality Date  . abalsion    . ablation    . ANTERIOR CERVICAL DECOMP/DISCECTOMY FUSION N/A 09/19/2015   Procedure: ANTERIOR CERVICAL DECOMPRESSION/DISCECTOMY FUSION 2 LEVELS;  Surgeon: Phylliss Bob, MD;  Location: [place];  Service: Orthopedics;  Laterality: N/A;  Anterior cervical decompression fusion, cervical 3-4, cervical 4-5 decompression with instrumentation and allograft  . BACK SURGERY    . FACIAL RECONSTRUCTION SURGERY    . TRANSFORAMINAL LUMBAR INTERBODY FUSION (TLIF) WITH PEDICLE SCREW FIXATION 2 LEVEL Right 07/26/2019   Procedure: RIGHT-SIDED LUMBAR 5 - SACRUM 1 TRANSFORAMINAL LUMBAR INTERBODY FUSION WITH INSTRUMENTATION AND ALLOGRAFT;  Surgeon: Phylliss Bob, MD;  Location: [place];  Service: Orthopedics;  Laterality: Right;    There were no vitals filed for this visit.  Subjective Assessment - 10/25/19 1303    Subjective  Pt. reports no c/o low  back pain.  Pt. c/o wrist disomfort and scheduled for injection tomorrow.  Pt. states she walked 11,000 steps outside yesterday with no issues.    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    Patient Stated Goals  Ensure proper lifting technique/ return to dancing    Currently in Pain?  No/denies           Pt. Has R wrist cortisone injection scheduled for tomorrow.  Pt. Is going to ENT for flushing of ears and assessment of tinnitus.  L forearm discomfort today due to sleeping on L UE wrong.       There.ex.:  Scifit L6 12 min. F/b with B UE/LE (warm-up) Discussed TrA ex. Program (no issues).   Seated blue ball: wt. Shifting/ marching/ alt. UE and LE/ LAQ/ 3# bicep curls/ punches/ press-ups 20x each.   TRX core muscle activation: sit to stands with chair touches/ lateral step outs/ B horizontal abduction with TrA activation 10x2.  Nautilus: 60# forward/ backwards/ lateral 3x with good posture/ core activation walk-out.   Supine hamstring/ piriformis/ trunk rotn. Stretches 3x each with static holds ("feels good") Walking in hallway with increase cadence/ posture discussion and added alt. UE and LE touches.         PT Long Term Goals - 10/11/19 1046  PT LONG TERM GOAL #1   Title  Pt. will be able to maintain TrA muscle activation for >10 seconds in supine while maintaining normal breathing to improve TrA awareness during ADLs    Baseline  Pt. can maintain TrA activation for 5 seconds    Time  4    Period  Weeks    Status  New    Target Date  11/08/19      PT LONG TERM GOAL #2   Title  Pt. will be able to demonstrate proper lifting techniques with 5lbs to improve movement patterns for work-related activities    Baseline  Pt. is limited to lifting 5# due to precautions    Time  4    Period  Weeks    Status  New    Target Date  11/08/19      PT LONG TERM GOAL #3   Title  Pt. will improve FOTO score to >66 to show improvements  with ADLs    Baseline  Score 63/Goal 66    Time  4    Period  Weeks    Status  New    Target Date  11/08/19      PT LONG TERM GOAL #4   Title  Pt. will be able to maintain scapular retraction and depression for >10 minutes to improve standing posture with work-related activities    Baseline  Pt. presents to the clinic with increased rounded shoulders and required verbal/visual mirror feedback for proper scapular retraction form.    Time  4    Period  Weeks    Status  New    Target Date  11/08/19            Plan - 10/25/19 1304    Clinical Impression Statement  Pt. continues to impress with core stability/ generalized strengthening ex. program.  Good TrA muscle activation during standing resisted tasks/ TrX ex.  Pt. will continue with current progress core ex. program and instructed to walk on a daily basis.  Pt. instructed to contact PT if any questions or issues prior to next tx. session.    Examination-Activity Limitations  Squat;Lift    Stability/Clinical Decision Making  Stable/Uncomplicated    Clinical Decision Making  Low    Rehab Potential  Excellent    PT Frequency  1x / week    PT Duration  4 weeks    PT Treatment/Interventions  ADLs/Self Care Home Management;Aquatic Therapy;Biofeedback;Cryotherapy;Electrical Stimulation;Gait training;Stair training;Functional mobility training;Therapeutic activities;Therapeutic exercise;Balance training;Patient/family education;Manual techniques    PT Next Visit Plan  TrA activation with all activites, postural exercises.  Issue new ex./ theraball ex.    PT Home Exercise Plan  [id number]    Consulted and Agree with Plan of Care  Patient       Patient will benefit from skilled therapeutic intervention in order to improve the following deficits and impairments:  Improper body mechanics, Decreased strength, Decreased coordination, Postural dysfunction  Visit Diagnosis: Poor body mechanics  Abdominal weakness     Problem  List Patient Active Problem List   Diagnosis Date Noted  . TIA (transient ischemic attack) 09/05/2019  . Radiculopathy 07/26/2019  . Leg pain 02/25/2018  . Edema 02/25/2018  . Hyperlipidemia 12/10/2017  . Screening for breast cancer 06/03/2017  . Screen for colon cancer 06/03/2017  . Bronchitis 12/03/2016  . Left hip pain 06/04/2016  . Routine general medical examination at a health care facility 03/04/2016  . 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 Allergic rhinitis 10/14/2012  . Hypertension 11/09/2011  . Rash 10/01/2011  . Diabetes mellitus type 2, controlled ([place]) 05/21/2011  . BMI 30.0-30.9,adult 05/21/2011   Pura Spice, PT, DPT # [phone] 10/25/2019, 2:00 PM  [facility] [address] [place], [place], [address] Phone: [phone]   Fax:  [phone]  Name: NARALI SAVARIA MRN: [id number] Date of Birth: [birth year]

## 2019-10-26 DIAGNOSIS — G5601 Carpal tunnel syndrome, right upper limb: Secondary | ICD-10-CM | POA: Diagnosis not present

## 2019-10-27 DIAGNOSIS — H9313 Tinnitus, bilateral: Secondary | ICD-10-CM | POA: Diagnosis not present

## 2019-10-27 DIAGNOSIS — H903 Sensorineural hearing loss, bilateral: Secondary | ICD-10-CM | POA: Diagnosis not present

## 2019-10-27 DIAGNOSIS — H9319 Tinnitus, unspecified ear: Secondary | ICD-10-CM | POA: Diagnosis not present

## 2019-10-31 ENCOUNTER — Ambulatory Visit: Payer: 59 | Admitting: Physical Therapy

## 2019-11-01 ENCOUNTER — Encounter: Payer: 59 | Admitting: Physical Therapy

## 2019-11-01 DIAGNOSIS — Z1211 Encounter for screening for malignant neoplasm of colon: Secondary | ICD-10-CM | POA: Diagnosis not present

## 2019-11-01 DIAGNOSIS — Z1212 Encounter for screening for malignant neoplasm of rectum: Secondary | ICD-10-CM | POA: Diagnosis not present

## 2019-11-02 ENCOUNTER — Ambulatory Visit: Payer: 59 | Admitting: Physical Therapy

## 2019-11-02 DIAGNOSIS — R42 Dizziness and giddiness: Secondary | ICD-10-CM | POA: Diagnosis not present

## 2019-11-07 ENCOUNTER — Other Ambulatory Visit: Payer: Self-pay

## 2019-11-07 ENCOUNTER — Ambulatory Visit: Payer: 59 | Admitting: Physical Therapy

## 2019-11-07 DIAGNOSIS — R29898 Other symptoms and signs involving the musculoskeletal system: Secondary | ICD-10-CM | POA: Diagnosis not present

## 2019-11-07 DIAGNOSIS — R198 Other specified symptoms and signs involving the digestive system and abdomen: Secondary | ICD-10-CM | POA: Diagnosis not present

## 2019-11-08 ENCOUNTER — Encounter: Payer: 59 | Admitting: Physical Therapy

## 2019-11-10 DIAGNOSIS — H9313 Tinnitus, bilateral: Secondary | ICD-10-CM | POA: Diagnosis not present

## 2019-11-11 ENCOUNTER — Encounter: Payer: Self-pay | Admitting: Physical Therapy

## 2019-11-11 NOTE — Therapy (Signed)
[facility] [address]. [place], [place], [address] Phone: [phone]   Fax:  [phone]  Physical Therapy Treatment  Patient Details  Name: Alyssa Schultz MRN: [id number] Date of Birth: 1965/09/13 Referring Provider (PT): Phylliss Bob, MD   Encounter Date: 11/07/2019  PT End of Session - 11/11/19 0712    Visit Number  4    Number of Visits  5    Date for PT Re-Evaluation  11/08/19    Authorization - Visit Number  4    Authorization - Number of Visits  10    PT Start Time  1640    PT Stop Time  1733    PT Time Calculation (min)  53 min    Activity Tolerance  Patient tolerated treatment well;Patient limited by pain    Behavior During Therapy  [facility] for tasks assessed/performed       Past Medical History:  Diagnosis Date  . Complication of anesthesia    hard to wake in 2017, patient reports she is 30 lb lighter now  . Diabetes mellitus    dx 2014   just take pills  . Hyperlipidemia   . Hypertension   . Neuromuscular disorder [facility])     Past Surgical History:  Procedure Laterality Date  . abalsion    . ablation    . ANTERIOR CERVICAL DECOMP/DISCECTOMY FUSION N/A 09/19/2015   Procedure: ANTERIOR CERVICAL DECOMPRESSION/DISCECTOMY FUSION 2 LEVELS;  Surgeon: Phylliss Bob, MD;  Location: [place];  Service: Orthopedics;  Laterality: N/A;  Anterior cervical decompression fusion, cervical 3-4, cervical 4-5 decompression with instrumentation and allograft  . BACK SURGERY    . FACIAL RECONSTRUCTION SURGERY    . TRANSFORAMINAL LUMBAR INTERBODY FUSION (TLIF) WITH PEDICLE SCREW FIXATION 2 LEVEL Right 07/26/2019   Procedure: RIGHT-SIDED LUMBAR 5 - SACRUM 1 TRANSFORAMINAL LUMBAR INTERBODY FUSION WITH INSTRUMENTATION AND ALLOGRAFT;  Surgeon: Phylliss Bob, MD;  Location: [place];  Service: Orthopedics;  Laterality: Right;    There were no vitals filed for this visit.  Subjective Assessment - 11/11/19 0709    Subjective  Pt. c/o R pec.  muscle strain since last PT tx. session and pt. 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  Pt. continues to c/o R wrist pain and states injection helped a little.  Pt. still wearing wrist cock-up splint at night.  Pt. has returned to working full days without limitations.    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    Patient Stated Goals  Ensure proper lifting technique/ return to dancing    Currently in Pain?  No/denies       There.ex.:  Scifit L6 12 min. F/b with B UE/LE (warm-up) Supine core ex.: knee to chest with ball/ bridging with added SLR/ trunk rotn. 20x.   TRX core muscle activation: sit to stands with chair touches (limited by R pec discomfort)- stopped after 3 reps. Nautilus: 60# forward/ backwards/ lateral 3x with good posture/ core activation walk-out.   Supine hamstring/ piriformis/ trunk rotn. Stretches 3x each with static holds GTB walk outs with core activation (slight R pec discomfort) Reviewed HEP    PT Long Term Goals - 10/11/19 1046      PT LONG TERM GOAL #1   Title  Pt. will be able to maintain TrA muscle activation for >10 seconds in supine while maintaining normal breathing to improve TrA awareness during ADLs    Baseline  Pt. can maintain TrA activation for 5 seconds    Time  4    Period  Weeks    Status  New    Target Date  11/08/19      PT LONG TERM GOAL #2   Title  Pt. will be able to demonstrate proper lifting techniques with 5lbs to improve movement patterns for work-related activities    Baseline  Pt. is limited to lifting 5# due to precautions    Time  4    Period  Weeks    Status  New    Target Date  11/08/19      PT LONG TERM GOAL #3   Title  Pt. will improve FOTO score to >66 to show improvements with ADLs    Baseline  Score 63/Goal 66    Time  4    Period  Weeks    Status  New    Target Date  11/08/19      PT LONG TERM GOAL #4   Title  Pt. will be able to maintain scapular  retraction and depression for >10 minutes to improve standing posture with work-related activities    Baseline  Pt. presents to the clinic with increased rounded shoulders and required verbal/visual mirror feedback for proper scapular retraction form.    Time  4    Period  Weeks    Status  New    Target Date  11/08/19            Plan - 11/11/19 0712    Clinical Impression Statement  Pt. limited by R pec. muscle discomfort during core ex involving UE (walk-outs/ TRX/ Scifit with UE).  Pt. reports no low back pain and progressing well with core based ex./ upright posture.  PT instructed pt. in gentle pec stretches/ use of ice to manage discomfort.  PT will reassess goals next tx. session to discuss recert vs. discharge.    Examination-Activity Limitations  Squat;Lift    Stability/Clinical Decision Making  Stable/Uncomplicated    Clinical Decision Making  Low    Rehab Potential  Excellent    PT Frequency  1x / week    PT Duration  4 weeks    PT Treatment/Interventions  ADLs/Self Care Home Management;Aquatic Therapy;Biofeedback;Cryotherapy;Electrical Stimulation;Gait training;Stair training;Functional mobility training;Therapeutic activities;Therapeutic exercise;Balance training;Patient/family education;Manual techniques    PT Next Visit Plan  TrA activation with all activites, postural exercises.  Issue new ex./ theraball ex.  RECERT vs. [place] and Agree with Plan of Care  Patient       Patient will benefit from skilled therapeutic intervention in order to improve the following deficits and impairments:  Improper body mechanics, Decreased strength, Decreased coordination, Postural dysfunction  Visit Diagnosis: Poor body mechanics  Abdominal weakness     Problem List Patient Active Problem List   Diagnosis Date Noted  . TIA (transient ischemic attack) 09/05/2019  . Radiculopathy 07/26/2019  . Leg pain 02/25/2018  . Edema  02/25/2018  . Hyperlipidemia 12/10/2017  . Screening for breast cancer 06/03/2017  . Screen for colon cancer 06/03/2017  . Bronchitis 12/03/2016  . Left hip pain 06/04/2016  . Routine general medical examination at a health care facility 03/04/2016  . 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 Allergic rhinitis 10/14/2012  . Hypertension 11/09/2011  . Rash 10/01/2011  . Diabetes mellitus type 2, controlled ([place]) 05/21/2011  . BMI 30.0-30.9,adult 05/21/2011  Pura Spice, PT, DPT # [phone] 11/11/2019, 7:16 AM  [facility] [address] [place], [place], [address] Phone: [phone]   Fax:  [phone]  Name: Alyssa Schultz MRN: [id number] Date of Birth: 1966-07-01

## 2019-11-14 ENCOUNTER — Other Ambulatory Visit: Payer: Self-pay

## 2019-11-14 ENCOUNTER — Ambulatory Visit: Payer: 59 | Admitting: Physical Therapy

## 2019-11-14 DIAGNOSIS — R198 Other specified symptoms and signs involving the digestive system and abdomen: Secondary | ICD-10-CM

## 2019-11-14 DIAGNOSIS — R29898 Other symptoms and signs involving the musculoskeletal system: Secondary | ICD-10-CM | POA: Diagnosis not present

## 2019-11-14 NOTE — Therapy (Signed)
[facility] [address] [place], [place], [address] Phone: [phone]   Fax:  [phone]  Physical Therapy Treatment  Patient Details  Name: Alyssa Schultz MRN: [id number] Date of Birth: September 11, 1965 Referring Provider (PT): Phylliss Bob, MD   Encounter Date: 11/14/2019  PT End of Session - 11/15/19 1915    Visit Number  5    Number of Visits  9    Date for PT Re-Evaluation  12/12/19    Authorization - Visit Number  1    Authorization - Number of Visits  10    PT Start Time  2863    PT Stop Time  1739    PT Time Calculation (min)  56 min    Activity Tolerance  Patient tolerated treatment well    Behavior During Therapy  [facility] for tasks assessed/performed       Past Medical History:  Diagnosis Date  . Complication of anesthesia    hard to wake in 2017, patient reports she is 30 lb lighter now  . Diabetes mellitus    dx 2014   just take pills  . Hyperlipidemia   . Hypertension   . Neuromuscular disorder [facility])     Past Surgical History:  Procedure Laterality Date  . abalsion    . ablation    . ANTERIOR CERVICAL DECOMP/DISCECTOMY FUSION N/A 09/19/2015   Procedure: ANTERIOR CERVICAL DECOMPRESSION/DISCECTOMY FUSION 2 LEVELS;  Surgeon: Phylliss Bob, MD;  Location: [place];  Service: Orthopedics;  Laterality: N/A;  Anterior cervical decompression fusion, cervical 3-4, cervical 4-5 decompression with instrumentation and allograft  . BACK SURGERY    . FACIAL RECONSTRUCTION SURGERY    . TRANSFORAMINAL LUMBAR INTERBODY FUSION (TLIF) WITH PEDICLE SCREW FIXATION 2 LEVEL Right 07/26/2019   Procedure: RIGHT-SIDED LUMBAR 5 - SACRUM 1 TRANSFORAMINAL LUMBAR INTERBODY FUSION WITH INSTRUMENTATION AND ALLOGRAFT;  Surgeon: Phylliss Bob, MD;  Location: [place];  Service: Orthopedics;  Laterality: Right;    There were no vitals filed for this visit.  Subjective Assessment - 11/15/19 1557    Subjective  Pt. states R pec pain is better but not  100%.  Pt. notices R pec pain with repeated UE reaching.  No back pain.    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    Patient Stated Goals  Ensure proper lifting technique/ return to dancing    Currently in Pain?  No/denies         [facility] PT Assessment - 11/15/19 0001      Assessment   Medical Diagnosis  S/P L5/S1 LTIF    Referring Provider (PT)  Phylliss Bob, MD    Onset Date/Surgical Date  07/26/19      Prior Function   Level of Independence  Independent         There.ex.:  Scifit L6 54mn. F/b with B UE/LE (warm-up).  No R UE use due to R pec. discomfort.   Nautilus: 60# forward/ backwards/ lateral 3x with good posture/ core activation walk-out.  12# floor to waist box lifting/ carrying.   Supine core ex.: GTB hip flexion/ extension/ abduction (single/ bilateral)- 20x each.  Supine hamstring/ piriformis/ trunk rotn. Stretches 3x each with static holds TM walking at 2.8 mph with core muscle activation/ posture instruction/ arm swing BOSU wt. Shifting/ partial squats 10x2 (no UE assist).    Discussed walking/ HEP     PT Long Term Goals - 11/15/19 1924  PT LONG TERM GOAL #1   Title  Pt. will be able to maintain TrA muscle activation for >10 seconds in supine while maintaining normal breathing to improve TrA awareness during ADLs    Baseline  Pt. can maintain TrA activation for 5 seconds.  3/30: goal met    Time  4    Period  Weeks    Status  Achieved    Target Date  11/14/19      PT LONG TERM GOAL #2   Title  Pt. will be able to demonstrate proper lifting techniques with 5lbs to improve movement patterns for work-related activities    Baseline  Pt. is limited to lifting 5# due to precautions.  3/30: goal met (12# lifting with no issues).    Time  4    Period  Weeks    Status  Achieved    Target Date  11/14/19      PT LONG TERM GOAL #3   Title  Pt. will improve FOTO score to >66 to show improvements with ADLs     Baseline  Score 63/Goal 66    Time  4    Period  Weeks    Status  On-going    Target Date  12/12/19      PT LONG TERM GOAL #4   Title  Pt. will be able to maintain scapular retraction and depression for >10 minutes to improve standing posture with work-related activities    Baseline  Pt. presents to the clinic with increased rounded shoulders and required verbal/visual mirror feedback for proper scapular retraction form.    Time  4    Period  Weeks    Status  Partially Met    Target Date  12/12/19            Plan - 11/15/19 1916    Clinical Impression Statement  Pts. lumbar AROM WFL and progressing well with core stability.  Pts. progression has been limited over past 2 tx. sessions due to R pec. muscle discomfort and R wrist pain.  Pt. unable to progress to quadruped/ planks at this time or resisted UE ther.ex.  Pt. has been able to return to work full-time without limitation over past several weeks.  Pt. will continue with independent based HEP and return to PT in 2 weeks for final progressive strengthening ex. program.  Probable discharge next tx. session if all goals met.    Examination-Activity Limitations  Squat;Lift    Stability/Clinical Decision Making  Stable/Uncomplicated    Clinical Decision Making  Low    Rehab Potential  Excellent    PT Frequency  1x / week    PT Duration  4 weeks    PT Treatment/Interventions  ADLs/Self Care Home Management;Aquatic Therapy;Biofeedback;Cryotherapy;Electrical Stimulation;Gait training;Stair training;Functional mobility training;Therapeutic activities;Therapeutic exercise;Balance training;Patient/family education;Manual techniques    PT Next Visit Plan  TrA activation with all activites, postural exercises.  Issue new ex./ theraball ex./ planks (if no pec pain).    PT Home Exercise Plan  [id number]    Consulted and Agree with Plan of Care  Patient       Patient will benefit from skilled therapeutic intervention in order to improve the  following deficits and impairments:  Improper body mechanics, Decreased strength, Decreased coordination, Postural dysfunction  Visit Diagnosis: Poor body mechanics  Abdominal weakness     Problem List Patient Active Problem List   Diagnosis Date Noted  . TIA (transient ischemic attack) 09/05/2019  . Radiculopathy 07/26/2019  . Leg  pain 02/25/2018  . Edema 02/25/2018  . Hyperlipidemia 12/10/2017  . Screening for breast cancer 06/03/2017  . Screen for colon cancer 06/03/2017  . Bronchitis 12/03/2016  . Left hip pain 06/04/2016  . Routine general medical examination at a health care facility 03/04/2016  . 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 Allergic rhinitis 10/14/2012  . Hypertension 11/09/2011  . Rash 10/01/2011  . Diabetes mellitus type 2, controlled ([place]) 05/21/2011  . BMI 30.0-30.9,adult 05/21/2011   Pura Spice, PT, DPT # [phone] 11/15/2019, 7:27 PM   [facility] [address] [place], [place], [address] Phone: [phone]   Fax:  [phone]  Name: ZIANNE SCHUBRING MRN: [id number] Date of Birth: 10/02/1965

## 2019-11-15 ENCOUNTER — Encounter: Payer: 59 | Admitting: Physical Therapy

## 2019-11-15 ENCOUNTER — Encounter: Payer: Self-pay | Admitting: Physical Therapy

## 2019-11-15 MED FILL — HYDROCHLOROTHIAZIDE 25 MG T: 25 | 90 days supply | Qty: 90 | Fill #0

## 2019-11-20 DIAGNOSIS — M48061 Spinal stenosis, lumbar region without neurogenic claudication: Secondary | ICD-10-CM | POA: Diagnosis not present

## 2019-11-20 DIAGNOSIS — M5412 Radiculopathy, cervical region: Secondary | ICD-10-CM | POA: Diagnosis not present

## 2019-11-21 MED FILL — LISINOPRIL 10 MG TABS: 10 | 90 days supply | Qty: 90 | Fill #1

## 2019-11-21 MED FILL — diazePAM 2 MG TABS: 2 | 1 days supply | Qty: 2 | Fill #0

## 2019-11-22 ENCOUNTER — Ambulatory Visit: Payer: 59 | Admitting: Physical Therapy

## 2019-11-22 ENCOUNTER — Other Ambulatory Visit: Payer: Self-pay | Admitting: Orthopedic Surgery

## 2019-11-22 DIAGNOSIS — M5412 Radiculopathy, cervical region: Secondary | ICD-10-CM

## 2019-11-24 ENCOUNTER — Other Ambulatory Visit: Payer: Self-pay

## 2019-11-24 ENCOUNTER — Encounter: Payer: Self-pay | Admitting: Family

## 2019-11-24 ENCOUNTER — Ambulatory Visit: Payer: 59 | Admitting: Family

## 2019-11-24 VITALS — BP 112/74 | HR 90 | Ht 65.5 in | Wt 147.0 lb

## 2019-11-24 DIAGNOSIS — E119 Type 2 diabetes mellitus without complications: Secondary | ICD-10-CM | POA: Diagnosis not present

## 2019-11-24 DIAGNOSIS — E785 Hyperlipidemia, unspecified: Secondary | ICD-10-CM | POA: Diagnosis not present

## 2019-11-24 DIAGNOSIS — G459 Transient cerebral ischemic attack, unspecified: Secondary | ICD-10-CM | POA: Diagnosis not present

## 2019-11-24 DIAGNOSIS — I10 Essential (primary) hypertension: Secondary | ICD-10-CM

## 2019-11-24 LAB — LIPID PANEL
Cholesterol: 155 mg/dL (ref 0–200)
HDL: 66.1 mg/dL (ref >39.00)
LDL Cholesterol: 81 mg/dL (ref 0–99)
NonHDL: 88.72
Total CHOL/HDL Ratio: 2
Triglycerides: 41 mg/dL (ref 0.0–149.0)
VLDL: 8.2 mg/dL (ref 0.0–40.0)

## 2019-11-24 LAB — MICROALBUMIN / CREATININE URINE RATIO
Creatinine,U: 59.6 mg/dL
Microalb Creat Ratio: 46.5 mg/g — ABNORMAL HIGH (ref 0.0–30.0)
Microalb, Ur: 27.7 mg/dL — ABNORMAL HIGH (ref 0.0–1.9)

## 2019-11-24 NOTE — Patient Instructions (Addendum)
Please schedule mammogram  Such a pleasure seeing you.  As discussed, we will monitor your blood pressure and blood sugar very closely.  For now we will hold on lisinopril since you had been dizzy on this medication.  Stay safe and enjoy your upcoming trip!   DASH Eating Plan DASH stands for "Dietary Approaches to Stop Hypertension." The DASH eating plan is a healthy eating plan that has been shown to reduce high blood pressure (hypertension). It may also reduce your risk for type 2 diabetes, heart disease, and stroke. The DASH eating plan may also help with weight loss. What are tips for following this plan?  General guidelines  Avoid eating more than 2,300 mg (milligrams) of salt (sodium) a day. If you have hypertension, you may need to reduce your sodium intake to 1,500 mg a day.  Limit alcohol intake to no more than 1 drink a day for nonpregnant women and 2 drinks a day for men. One drink equals 12 oz of beer, 5 oz of wine, or 1 oz of hard liquor.  Work with your health care provider to maintain a healthy body weight or to lose weight. Ask what an ideal weight is for you.  Get at least 30 minutes of exercise that causes your heart to beat faster (aerobic exercise) most days of the week. Activities may include walking, swimming, or biking.  Work with your health care provider or diet and nutrition specialist (dietitian) to adjust your eating plan to your individual calorie needs. Reading food labels   Check food labels for the amount of sodium per serving. Choose foods with less than 5 percent of the Daily Value of sodium. Generally, foods with less than 300 mg of sodium per serving fit into this eating plan.  To find whole grains, look for the word "whole" as the first word in the ingredient list. Shopping  Buy products labeled as "low-sodium" or "no salt added."  Buy fresh foods. Avoid canned foods and premade or frozen meals. Cooking  Avoid adding salt when cooking. Use  salt-free seasonings or herbs instead of table salt or sea salt. Check with your health care provider or pharmacist before using salt substitutes.  Do not fry foods. Cook foods using healthy methods such as baking, boiling, grilling, and broiling instead.  Cook with heart-healthy oils, such as olive, canola, soybean, or sunflower oil. Meal planning  Eat a balanced diet that includes: ? 5 or more servings of fruits and vegetables each day. At each meal, try to fill half of your plate with fruits and vegetables. ? Up to 6-8 servings of whole grains each day. ? Less than 6 oz of lean meat, poultry, or fish each day. A 3-oz serving of meat is about the same size as a deck of cards. One egg equals 1 oz. ? 2 servings of low-fat dairy each day. ? A serving of nuts, seeds, or beans 5 times each week. ? Heart-healthy fats. Healthy fats called Omega-3 fatty acids are found in foods such as flaxseeds and coldwater fish, like sardines, salmon, and mackerel.  Limit how much you eat of the following: ? Canned or prepackaged foods. ? Food that is high in trans fat, such as fried foods. ? Food that is high in saturated fat, such as fatty meat. ? Sweets, desserts, sugary drinks, and other foods with added sugar. ? Full-fat dairy products.  Do not salt foods before eating.  Try to eat at least 2 vegetarian meals each week.  Eat more home-cooked food and less restaurant, buffet, and fast food.  When eating at a restaurant, ask that your food be prepared with less salt or no salt, if possible. What foods are recommended? The items listed may not be a complete list. Talk with your dietitian about what dietary choices are best for you. Grains Whole-grain or whole-wheat bread. Whole-grain or whole-wheat pasta. Brown rice. [place]. Bulgur. Whole-grain and low-sodium cereals. Pita bread. Low-fat, low-sodium crackers. Whole-wheat flour tortillas. Vegetables Fresh or frozen vegetables (raw, steamed,  roasted, or grilled). Low-sodium or reduced-sodium tomato and vegetable juice. Low-sodium or reduced-sodium tomato sauce and tomato paste. Low-sodium or reduced-sodium canned vegetables. Fruits All fresh, dried, or frozen fruit. Canned fruit in natural juice (without added sugar). Meat and other protein foods Skinless chicken or [place]. Ground chicken or [place]. Pork with fat trimmed off. Fish and seafood. Egg whites. Dried beans, peas, or lentils. Unsalted nuts, nut butters, and seeds. Unsalted canned beans. Lean cuts of beef with fat trimmed off. Low-sodium, lean deli meat. Dairy Low-fat (1%) or fat-free (skim) milk. Fat-free, low-fat, or reduced-fat cheeses. Nonfat, low-sodium ricotta or cottage cheese. Low-fat or nonfat yogurt. Low-fat, low-sodium cheese. Fats and oils Soft margarine without trans fats. Vegetable oil. Low-fat, reduced-fat, or light mayonnaise and salad dressings (reduced-sodium). Canola, safflower, olive, soybean, and sunflower oils. Avocado. Seasoning and other foods Herbs. Spices. Seasoning mixes without salt. Unsalted popcorn and pretzels. Fat-free sweets. What foods are not recommended? The items listed may not be a complete list. Talk with your dietitian about what dietary choices are best for you. Grains Baked goods made with fat, such as croissants, muffins, or some breads. Dry pasta or rice meal packs. Vegetables Creamed or fried vegetables. Vegetables in a cheese sauce. Regular canned vegetables (not low-sodium or reduced-sodium). Regular canned tomato sauce and paste (not low-sodium or reduced-sodium). Regular tomato and vegetable juice (not low-sodium or reduced-sodium). Angie Fava. Olives. Fruits Canned fruit in a light or heavy syrup. Fried fruit. Fruit in cream or butter sauce. Meat and other protein foods Fatty cuts of meat. Ribs. Fried meat. Berniece Salines. Sausage. Bologna and other processed lunch meats. Salami. Fatback. Hotdogs. Bratwurst. Salted nuts and seeds. Canned  beans with added salt. Canned or smoked fish. Whole eggs or egg yolks. Chicken or [place] with skin. Dairy Whole or 2% milk, cream, and half-and-half. Whole or full-fat cream cheese. Whole-fat or sweetened yogurt. Full-fat cheese. Nondairy creamers. Whipped toppings. Processed cheese and cheese spreads. Fats and oils Butter. Stick margarine. Lard. Shortening. Ghee. Bacon fat. Tropical oils, such as coconut, palm kernel, or palm oil. Seasoning and other foods Salted popcorn and pretzels. Onion salt, garlic salt, seasoned salt, table salt, and sea salt. Worcestershire sauce. Tartar sauce. Barbecue sauce. Teriyaki sauce. Soy sauce, including reduced-sodium. Steak sauce. Canned and packaged gravies. Fish sauce. Oyster sauce. Cocktail sauce. Horseradish that you find on the shelf. Ketchup. Mustard. Meat flavorings and tenderizers. Bouillon cubes. Hot sauce and Tabasco sauce. Premade or packaged marinades. Premade or packaged taco seasonings. Relishes. Regular salad dressings. Where to find more information:  National Heart, Lung, and [place]: https://wilson-eaton.com/  American Heart Association: www.heart.org Summary  The DASH eating plan is a healthy eating plan that has been shown to reduce high blood pressure (hypertension). It may also reduce your risk for type 2 diabetes, heart disease, and stroke.  With the DASH eating plan, you should limit salt (sodium) intake to 2,300 mg a day. If you have hypertension, you may need to reduce your sodium intake  to 1,500 mg a day.  When on the DASH eating plan, aim to eat more fresh fruits and vegetables, whole grains, lean proteins, low-fat dairy, and heart-healthy fats.  Work with your health care provider or diet and nutrition specialist (dietitian) to adjust your eating plan to your individual calorie needs. This information is not intended to replace advice given to you by your health care provider. Make sure you discuss any questions you have with your  health care provider. Document Revised: 07/16/2017 Document Reviewed: 07/27/2016 Elsevier Patient Education  2020 [employer].

## 2019-11-24 NOTE — Assessment & Plan Note (Addendum)
Patient had tremendous weight loss, following low-salt.  She is not hypertensive.  Discussed guidelines of placing patient on ACE inhibitor or ARB in setting of diabetes, unfortunately patient was quite dizzy on this regimen so we have jointly agreed we would not place her back on an ACE or an ARB during today's visit.  We agreed to continue to  closely monitor blood pressure ly along with A1c, urine protein, and discuss at future follow-ups in regards to resuming lisinopril.

## 2019-11-24 NOTE — Assessment & Plan Note (Signed)
On statin, 81 mg aspirin.  Follow-up scheduled with neurology this summer.  Will follow.

## 2019-11-24 NOTE — Assessment & Plan Note (Signed)
Much improved.  pending lipid panel on increased dose of Crestor 40 mg

## 2019-11-24 NOTE — Progress Notes (Signed)
Subjective:    Patient ID: Alyssa Schultz, female    DOB: 01/05/1966, 54 y.o.   MRN: [id number]  CC: Alyssa Schultz is a 54 y.o. female who presents today for follow up.   HPI: Re establishing care Prior MD Ronnald Ramp  Lost 40 lbs from increase in exercise and eating fresh fruits and vegetables.  Limits salt.  Counting calories. Feels best that she is felt in a long time  DM-compliant with Onglyza, Glucophage.  Would like to decrease her Metformin dose.  HTN- no longer on lisinopril 2.5mg  for renal protection due  dizziness.  Check blood pressure every day and typically gets around 99/71.  Denies exertional chest pain or pressure, numbness or tingling radiating to left arm or jaw, palpitations, dizziness, frequent headaches, changes in vision, or shortness of breath.    ASA 81mg    H/o TIA- completed plavix 3 weeks duration; follow up neurology, Dr Manuella Ghazi, 03/2020.   HLD- compliant with crestor; increased from 10 mg QOD to 40mg  daily 3 months ago.     Lumbar fusion 07/26/2019  Hospitalized 09/05/19 with TIA, expressive aphasia, right upper extremity numbness Discontinued HCTZ due to hyponatremia, hypokalemia Holter monitor 08/2019: Predominant sinus rhythm ETT was normal Echocardiogram EF 55 to 60% HISTORY:  Past Medical History:  Diagnosis Date  . Complication of anesthesia    hard to wake in 2017, patient reports she is 30 lb lighter now  . Diabetes mellitus    dx 2014   just take pills  . Hyperlipidemia   . Hypertension   . Neuromuscular disorder [facility])    Past Surgical History:  Procedure Laterality Date  . abalsion    . ablation    . ANTERIOR CERVICAL DECOMP/DISCECTOMY FUSION N/A 09/19/2015   Procedure: ANTERIOR CERVICAL DECOMPRESSION/DISCECTOMY FUSION 2 LEVELS;  Surgeon: Phylliss Bob, MD;  Location: [place];  Service: Orthopedics;  Laterality: N/A;  Anterior cervical decompression fusion, cervical 3-4, cervical 4-5 decompression with instrumentation and allograft  . BACK  SURGERY    . FACIAL RECONSTRUCTION SURGERY    . TRANSFORAMINAL LUMBAR INTERBODY FUSION (TLIF) WITH PEDICLE SCREW FIXATION 2 LEVEL Right 07/26/2019   Procedure: RIGHT-SIDED LUMBAR 5 - SACRUM 1 TRANSFORAMINAL LUMBAR INTERBODY FUSION WITH INSTRUMENTATION AND ALLOGRAFT;  Surgeon: Phylliss Bob, MD;  Location: [place];  Service: Orthopedics;  Laterality: Right;   Family History  Problem Relation Age of Onset  . Diabetes Mother   . Diabetes Maternal Grandmother   . Hypertension Maternal Grandfather   . Breast cancer Neg Hx     Allergies: Influenza vaccine live, Influenza vaccines, Hydrocodone, Morphine and related, and Prednisone Current Outpatient Medications on File Prior to Visit  Medication Sig Dispense Refill  . Accu-Chek FastClix Lancets MISC USE TO TEST ONCE DAILY 102 each 0  . aspirin EC 81 MG EC tablet Take 1 tablet (81 mg total) by mouth daily. 30 tablet 0  . glucose blood (ACCU-CHEK GUIDE) test strip Accu-Chek Guide strips    . meclizine (ANTIVERT) 50 MG tablet Take 1 tablet (50 mg total) by mouth 3 (three) times daily as needed for dizziness. 30 tablet 1  . metFORMIN (GLUCOPHAGE) 500 MG tablet TAKE 1 TABLET BY MOUTH 2 TIMES DAILY WITH A MEAL (Patient taking differently: Take 500 mg by mouth 3 (three) times daily. TAKE 1 TABLET BY MOUTH 3 TIMES DAILY WITH A MEAL/ Dr Honor Junes) 60 tablet 1  . ondansetron (ZOFRAN) 4 MG tablet Take 1 tablet (4 mg total) by mouth every 8 (eight) hours as  needed for nausea or vomiting. 20 tablet 0  . rosuvastatin (CRESTOR) 40 MG tablet Take 1 tablet (40 mg total) by mouth daily. 90 tablet 0  . saxagliptin HCl (ONGLYZA) 5 MG TABS tablet Take 1 tablet (5 mg total) by mouth daily. 30 tablet 5  . TRUEPLUS LANCETS 30G MISC USE AS DIRECTED 100 each 5  . valACYclovir (VALTREX) 500 MG tablet Take 2 tablets (1,000 mg total) by mouth 2 (two) times daily. Take prn cold sore on on day 12 tablet 1   No current facility-administered medications on file prior to visit.     Social History   Tobacco Use  . Smoking status: Never Smoker  . Smokeless tobacco: Never Used  Substance Use Topics  . Alcohol use: Yes    Comment: Occasional wine  . Drug use: No    Review of Systems  Constitutional: Negative for chills and fever.  Respiratory: Negative for cough.   Cardiovascular: Negative for chest pain and palpitations.  Gastrointestinal: Negative for nausea and vomiting.  Neurological: Negative for dizziness, facial asymmetry and headaches.      Objective:    BP 112/74   Pulse 90   Ht 5' 5.5" (1.664 m)   Wt 147 lb (66.7 kg)   LMP 07/24/2007 Comment: ablation in 2008-pt reports she went through menopause after this  SpO2 98%   BMI 24.09 kg/m  BP Readings from Last 3 Encounters:  11/24/19 112/74  10/09/19 120/80  09/18/19 124/80   Wt Readings from Last 3 Encounters:  11/24/19 147 lb (66.7 kg)  10/09/19 143 lb (64.9 kg)  09/18/19 142 lb (64.4 kg)    Physical Exam Vitals reviewed.  Constitutional:      Appearance: She is well-developed.  Eyes:     Conjunctiva/sclera: Conjunctivae normal.  Cardiovascular:     Rate and Rhythm: Normal rate and regular rhythm.     Pulses: Normal pulses.     Heart sounds: Normal heart sounds.  Pulmonary:     Effort: Pulmonary effort is normal.     Breath sounds: Normal breath sounds. No wheezing, rhonchi or rales.  Skin:    General: Skin is warm and dry.  Neurological:     Mental Status: She is alert.  Psychiatric:        Speech: Speech normal.        Behavior: Behavior normal.        Thought Content: Thought content normal.        Assessment & Plan:   Problem List Items Addressed This Visit      Cardiovascular and Mediastinum   Hypertension    Patient had tremendous weight loss, following low-salt.  She is not hypertensive.  Discussed guidelines of placing patient on ACE inhibitor or ARB in setting of diabetes, unfortunately patient was quite dizzy on this regimen so we have jointly agreed we  would not place her back on an ACE or an ARB during today's visit.  We agreed to continue to  closely monitor blood pressure ly along with A1c, urine protein, and discuss at future follow-ups in regards to resuming lisinopril.      TIA (transient ischemic attack)    On statin, 81 mg aspirin.  Follow-up scheduled with neurology this summer.  Will follow.        Endocrine   Diabetes mellitus type 2, controlled ([place])    Very well controlled.  She would like to reduce her Metformin dose which I think is appropriate as long as we continue  to follow her A1c.  trial decreasing Metformin from 1500mg  per day to 500mg .  she is upcoming follow-up with endocrine      Relevant Orders   Microalbumin / creatinine urine ratio     Other   Hyperlipidemia - Primary    Much improved.  pending lipid panel on increased dose of Crestor 40 mg      Relevant Orders   Lipid panel       I have discontinued Cristopher Peru. Senkbeil's nystatin cream. I am also having her maintain her meclizine, TRUEplus Lancets 30G, glucose blood, saxagliptin HCl, metFORMIN, Accu-Chek FastClix Lancets, aspirin, ondansetron, rosuvastatin, and valACYclovir.   No orders of the defined types were placed in this encounter.   Return precautions given.   Risks, benefits, and alternatives of the medications and treatment plan prescribed today were discussed, and patient expressed understanding.   Education regarding symptom management and diagnosis given to patient on AVS.  Continue to follow with Juline Patch, MD for routine health maintenance.   Cephus Richer and I agreed with plan.   Mable Paris, FNP

## 2019-11-24 NOTE — Assessment & Plan Note (Signed)
Very well controlled.  She would like to reduce her Metformin dose which I think is appropriate as long as we continue to follow her A1c.  trial decreasing Metformin from 1500mg  per day to 500mg .  she is upcoming follow-up with endocrine

## 2019-11-26 ENCOUNTER — Encounter: Payer: Self-pay | Admitting: Family

## 2019-11-28 ENCOUNTER — Encounter: Payer: Self-pay | Admitting: Family

## 2019-11-28 ENCOUNTER — Ambulatory Visit: Payer: 59 | Admitting: Physical Therapy

## 2019-11-28 NOTE — Telephone Encounter (Signed)
Pt has a couple of questions about referral and medication management. Please call back

## 2019-11-29 ENCOUNTER — Other Ambulatory Visit: Payer: Self-pay | Admitting: Family

## 2019-11-29 DIAGNOSIS — R809 Proteinuria, unspecified: Secondary | ICD-10-CM

## 2019-12-07 ENCOUNTER — Encounter: Payer: Self-pay | Admitting: Family

## 2019-12-12 ENCOUNTER — Encounter: Payer: Self-pay | Admitting: Family

## 2019-12-18 ENCOUNTER — Other Ambulatory Visit: Payer: Self-pay | Admitting: Nephrology

## 2019-12-18 ENCOUNTER — Ambulatory Visit: Payer: 59

## 2019-12-18 DIAGNOSIS — R809 Proteinuria, unspecified: Secondary | ICD-10-CM | POA: Diagnosis not present

## 2019-12-18 DIAGNOSIS — E1122 Type 2 diabetes mellitus with diabetic chronic kidney disease: Secondary | ICD-10-CM

## 2019-12-18 DIAGNOSIS — E1129 Type 2 diabetes mellitus with other diabetic kidney complication: Secondary | ICD-10-CM | POA: Diagnosis not present

## 2019-12-19 ENCOUNTER — Encounter: Payer: Self-pay | Admitting: Family

## 2019-12-20 ENCOUNTER — Encounter: Payer: Self-pay | Admitting: Family

## 2019-12-20 ENCOUNTER — Other Ambulatory Visit: Payer: Self-pay

## 2019-12-20 DIAGNOSIS — R809 Proteinuria, unspecified: Secondary | ICD-10-CM | POA: Insufficient documentation

## 2019-12-20 DIAGNOSIS — E785 Hyperlipidemia, unspecified: Secondary | ICD-10-CM

## 2019-12-20 MED ORDER — ROSUVASTATIN CALCIUM 40 MG PO TABS: 40.0000 mg | ORAL_TABLET | Freq: Every day | ORAL | 0 refills | Status: DC

## 2019-12-20 MED FILL — ROSUVASTATIN CALCIUM 40 MG: 40 | 90 days supply | Qty: 90 | Fill #0

## 2019-12-26 ENCOUNTER — Ambulatory Visit
Admission: RE | Admit: 2019-12-26 | Discharge: 2019-12-26 | Disposition: A | Discharge status: Home / Self Care | Payer: 59 | Source: Ambulatory Visit | Attending: Nephrology | Admitting: Nephrology

## 2019-12-26 ENCOUNTER — Other Ambulatory Visit: Payer: Self-pay

## 2019-12-26 ENCOUNTER — Ambulatory Visit
Admission: RE | Admit: 2019-12-26 | Discharge: 2019-12-26 | Disposition: A | Discharge status: Home / Self Care | Payer: 59 | Source: Ambulatory Visit | Attending: Family Medicine | Admitting: Family Medicine

## 2019-12-26 DIAGNOSIS — E1122 Type 2 diabetes mellitus with diabetic chronic kidney disease: Secondary | ICD-10-CM | POA: Insufficient documentation

## 2019-12-26 DIAGNOSIS — R809 Proteinuria, unspecified: Secondary | ICD-10-CM | POA: Insufficient documentation

## 2019-12-26 DIAGNOSIS — N189 Chronic kidney disease, unspecified: Secondary | ICD-10-CM | POA: Diagnosis not present

## 2019-12-26 DIAGNOSIS — Z1231 Encounter for screening mammogram for malignant neoplasm of breast: Secondary | ICD-10-CM | POA: Diagnosis not present

## 2019-12-27 DIAGNOSIS — H903 Sensorineural hearing loss, bilateral: Secondary | ICD-10-CM | POA: Diagnosis not present

## 2020-01-01 ENCOUNTER — Encounter: Payer: Self-pay | Admitting: Family

## 2020-01-01 ENCOUNTER — Other Ambulatory Visit: Payer: Self-pay

## 2020-01-01 DIAGNOSIS — E119 Type 2 diabetes mellitus without complications: Secondary | ICD-10-CM

## 2020-01-01 DIAGNOSIS — R110 Nausea: Secondary | ICD-10-CM

## 2020-01-01 MED ORDER — METFORMIN HCL 500 MG PO TABS: ORAL_TABLET | ORAL | 1 refills | Status: DC

## 2020-01-01 MED ORDER — SAXAGLIPTIN HCL 5 MG PO TABS: 5.0000 mg | ORAL_TABLET | Freq: Every day | ORAL | 5 refills | Status: DC

## 2020-01-01 MED FILL — ONGLYZA 5 MG TABLET: 5 | 30 days supply | Qty: 30 | Fill #0

## 2020-01-01 MED FILL — LOSARTAN POTASSIUM 25 MG TA: 25 | 30 days supply | Qty: 15 | Fill #0

## 2020-01-01 MED FILL — METFORMIN HCL 500 MG TABS: 500 | 30 days supply | Qty: 60 | Fill #0

## 2020-01-18 NOTE — Progress Notes (Signed)
[facility]  [address] [place], [place] [address] Phone: [phone]  Fax: [phone]   Clinic Day:  01/19/2020  Referring physician: Anthonette Legato, MD  Chief Complaint: TANJA DEVER is a 54 y.o. female with diabetes and essential hypertension who is referred in consultation by Dr. Holley Raring for an abnormal SPEP and UPEP.   HPI: The patient was seen by Dr. Holley Raring on 12/18/2019 for initial consultation for type II diabetes mellitus with proteinuria.  She had lost a significant amount of weight (40 lb) recently.  She was initially taken off lisinopril for lightheadedness and dizziness.  Microalbumin creatinine ratio was 46.5 (elevated) with an albumin creatinine ratio greater than 300.  A1c was 5.8.  She was salt restricted.  Blood pressures was under good control.  Labs on 12/18/2019 revealed a creatinine of 0.74, calcium 9.6, albumin 4.4, and total protein 7.2.  Random UPEP revealed 973 mg/gm creatinine (21-161) and a protein/creatinine ratio 0.973 (0.21-0.161).  SPEP revealed a poorly defined band of restriction restricted protein mobility in the gamma globulins unlikely to represent a monoclonal protein.  Random protein urine was 36 (5-24).  There was a possible abnormal protein band detected in the beta globulins that may represent a monoclonal immunoglobulin or free light chain (Bence-Jones protein).  Renal ultrasound on 12/26/2019 revealed increased echogenicity and diminished corticomedullary differentiation seen in the setting of medical renal disease. There was no hydronephrosis.  Symptomatically, she feels "good" today.  She sometimes feels dizzy. Ever since she has been on cholesterol medicine at night, she has been feeling dizzy. She denies any fever, sweats, and weight loss. She lost 40 lb over the course of a year (2019) intentionally.  She denies any visual changes; she wears contacts. She denies runny nose, shortness of breath, and cough. She  denies all abdominal symptoms. Her urine is sometimes "foamy". She denies any bone or joint pain. She has no issues with infections. She denies skin changes.  She denies any weakness, balance or coordination problems. She denies any family history of blood disorders or cancer. She does not smoke or drink. She denies being exposed to any radiation or toxins.   She has not received her COVID-19 vaccine.    Past Medical History:  Diagnosis Date  . Complication of anesthesia    hard to wake in 2017, patient reports she is 30 lb lighter now  . Diabetes mellitus    dx 2014   just take pills  . Hyperlipidemia   . Hypertension   . Neuromuscular disorder [facility])     Past Surgical History:  Procedure Laterality Date  . abalsion    . ablation    . ANTERIOR CERVICAL DECOMP/DISCECTOMY FUSION N/A 09/19/2015   Procedure: ANTERIOR CERVICAL DECOMPRESSION/DISCECTOMY FUSION 2 LEVELS;  Surgeon: Phylliss Bob, MD;  Location: [place];  Service: Orthopedics;  Laterality: N/A;  Anterior cervical decompression fusion, cervical 3-4, cervical 4-5 decompression with instrumentation and allograft  . BACK SURGERY    . FACIAL RECONSTRUCTION SURGERY    . TRANSFORAMINAL LUMBAR INTERBODY FUSION (TLIF) WITH PEDICLE SCREW FIXATION 2 LEVEL Right 07/26/2019   Procedure: RIGHT-SIDED LUMBAR 5 - SACRUM 1 TRANSFORAMINAL LUMBAR INTERBODY FUSION WITH INSTRUMENTATION AND ALLOGRAFT;  Surgeon: Phylliss Bob, MD;  Location: [place];  Service: Orthopedics;  Laterality: Right;    Family History  Problem Relation Age of Onset  . Diabetes Mother   . Diabetes Maternal Grandmother   . Hypertension Maternal Grandfather   . Breast cancer Neg Hx  Social History:  reports that she has never smoked. She has never used smokeless tobacco. She reports current alcohol use. She reports that she does not use drugs. She denies being exposed to any radiations or toxins.  She works for [employer] as a Charity fundraiser.  She lives in [place].  The patient  is alone today.  Allergies:  Allergies  Allergen Reactions  . Influenza Vaccine Live     Per Dr - Pt is to not receive vaccine   . Influenza Vaccines     Per Dr - Pt is to not receive vaccine   . Hydrocodone Rash  . Morphine And Related Other (See Comments)    Makes her crazy  . Prednisone     "makes me crazy"    Current Medications: Current Outpatient Medications  Medication Sig Dispense Refill  . Accu-Chek FastClix Lancets MISC USE TO TEST ONCE DAILY 102 each 0  . aspirin EC 81 MG EC tablet Take 1 tablet (81 mg total) by mouth daily. 30 tablet 0  . glucose blood (ACCU-CHEK GUIDE) test strip Accu-Chek Guide strips    . losartan (COZAAR) 25 MG tablet Take 1 tablet by mouth daily.    . meclizine (ANTIVERT) 50 MG tablet Take 1 tablet (50 mg total) by mouth 3 (three) times daily as needed for dizziness. 30 tablet 1  . metFORMIN (GLUCOPHAGE) 500 MG tablet TAKE 1 TABLET BY MOUTH 2 TIMES DAILY WITH A MEAL 60 tablet 1  . ondansetron (ZOFRAN) 4 MG tablet Take 1 tablet (4 mg total) by mouth every 8 (eight) hours as needed for nausea or vomiting. 20 tablet 0  . rosuvastatin (CRESTOR) 40 MG tablet Take 1 tablet (40 mg total) by mouth daily. 90 tablet 0  . saxagliptin HCl (ONGLYZA) 5 MG TABS tablet Take 1 tablet (5 mg total) by mouth daily. 30 tablet 5  . TRUEPLUS LANCETS 30G MISC USE AS DIRECTED 100 each 5  . valACYclovir (VALTREX) 500 MG tablet Take 2 tablets (1,000 mg total) by mouth 2 (two) times daily. Take prn cold sore on on day 12 tablet 1   No current facility-administered medications for this visit.    Review of Systems  Constitutional: Negative for chills, diaphoresis, fever, malaise/fatigue and weight loss.       Feels "good"  today.   HENT: Negative.  Negative for congestion, ear pain, hearing loss, nosebleeds, sinus pain, sore throat and tinnitus.   Eyes: Negative for blurred vision, double vision and photophobia.       Wears contacts.   Respiratory: Negative.  Negative for  cough, sputum production, shortness of breath and wheezing.   Cardiovascular: Negative.  Negative for chest pain, palpitations, orthopnea and leg swelling.  Gastrointestinal: Negative.  Negative for abdominal pain, blood in stool, constipation, diarrhea, heartburn, melena, nausea and vomiting.  Genitourinary: Negative for dysuria, frequency and urgency.       Urine is sometimes "foamy".   Musculoskeletal: Negative.  Negative for back pain, falls, joint pain, myalgias and neck pain.  Skin: Negative.  Negative for rash.  Neurological: Positive for dizziness (at times; 2x/week). Negative for tingling, sensory change, speech change, focal weakness, seizures, weakness and headaches.  Endo/Heme/Allergies: Negative.  Negative for environmental allergies. Does not bruise/bleed easily.  Psychiatric/Behavioral: Negative.  Negative for depression and memory loss. The patient is not nervous/anxious and does not have insomnia.    Performance status (ECOG): 0 - Asymptomatic  Vitals Blood pressure 112/74, pulse 76, temperature (!) 96.4 F (35.8 C), temperature  source Tympanic, resp. rate 16, weight 147 lb 8 oz (66.9 kg), last menstrual period 07/24/2007, SpO2 100 %.   Physical Exam  Constitutional: She is oriented to person, place, and time. She appears well-developed and well-nourished. No distress. Face mask in place.  HENT:  Head: Normocephalic and atraumatic.  Mouth/Throat: Oropharynx is clear and moist and mucous membranes are normal. No oral lesions. No oropharyngeal exudate.  Shoulder length dark hair.  Eyes: Pupils are equal, round, and reactive to light. Conjunctivae and EOM are normal. Right eye exhibits no discharge. Left eye exhibits no discharge. No scleral icterus.  Brown eyes.  Contacts.   Neck: No JVD present.  Cardiovascular: Normal rate, regular rhythm, normal heart sounds and intact distal pulses. Exam reveals no gallop and no friction rub.  No murmur heard. Pulmonary/Chest: Effort  normal and breath sounds normal. She has no wheezes. She has no rhonchi. She has no rales.  Abdominal: Soft. Normal appearance and bowel sounds are normal. She exhibits no distension and no mass. There is no hepatosplenomegaly. There is no abdominal tenderness. There is no rebound and no guarding.  Musculoskeletal:        General: No tenderness or edema. Normal range of motion.     Cervical back: Normal range of motion and neck supple.  Lymphadenopathy:       Head (right side): No preauricular, no posterior auricular and no occipital adenopathy present.       Head (left side): No preauricular, no posterior auricular and no occipital adenopathy present.    She has no cervical adenopathy.    She has no axillary adenopathy.       Right: No inguinal and no supraclavicular adenopathy present.       Left: No inguinal and no supraclavicular adenopathy present.  Neurological: She is alert and oriented to person, place, and time.  Skin: Skin is warm, dry and intact. No bruising, no lesion and no rash noted. She is not diaphoretic. No erythema. No pallor.  Psychiatric: She has a normal mood and affect. Her behavior is normal. Judgment and thought content normal.  Nursing note and vitals reviewed.   No visits with results within 3 Day(s) from this visit.  Latest known visit with results is:  Office Visit on 11/24/2019  Component Date Value Ref Range Status  . Cholesterol 11/24/2019 155  0 - 200 mg/dL Final   ATP III Classification       Desirable:  < 200 mg/dL               Borderline High:  200 - 239 mg/dL          High:  > = 240 mg/dL  . Triglycerides 11/24/2019 41.0  0.0 - 149.0 mg/dL Final   Normal:  <150 mg/dLBorderline High:  150 - 199 mg/dL  . HDL 11/24/2019 66.10  >39.00 mg/dL Final  . VLDL 11/24/2019 8.2  0.0 - 40.0 mg/dL Final  . LDL Cholesterol 11/24/2019 81  0 - 99 mg/dL Final  . Total CHOL/HDL Ratio 11/24/2019 2   Final                  Men          Women1/2 Average Risk     3.4           3.3Average Risk          5.0          4.42X Average Risk  9.6          7.13X Average Risk          15.0          11.0                      . NonHDL 11/24/2019 88.72   Final   NOTE:  Non-HDL goal should be 30 mg/dL higher than patient's LDL goal (i.e. LDL goal of < 70 mg/dL, would have non-HDL goal of < 100 mg/dL)  . Microalb, Ur 11/24/2019 27.7* 0.0 - 1.9 mg/dL Final  . Creatinine,U 11/24/2019 59.6  mg/dL Final  . Microalb Creat Ratio 11/24/2019 46.5* 0.0 - 30.0 mg/g Final    Assessment:  BREEZE BISSETT is a 54 y.o. female with an abnormal SPEP and UPEP.  SPEP on 12/18/2019 revealed a poorly defined band of restriction restricted protein mobility in the gamma globulins unlikely to represent a monoclonal protein.  Random protein urine was 36 (5-24).  There was a possible abnormal protein band detected in the beta globulins that may represent a monoclonal immunoglobulin or free light chain (Bence-Jones protein).  She has diabetes, hypertension, h/o TIA, and lumbar spine surgery.  She has not received the COVID-19 vaccine.  Symptomatically, she denies any B symptoms.  She denies any bone pain or infections.  Exam is unremarkable.  Plan: 1.   Labs today:  CBC with diff, CMP, myeloma panel, FLCA. 2.   24 hour urine for UPEP and free light chains 3.   Abnormal SPEP  Significance is unclear.  Discuss differential diagnosis.  Discuss laboratory work-up (blood and urine). 4.   RTC in 01/29/2020 for MD assessment, review of work-up, and discussion regarding direction of therapy.  I discussed the assessment and treatment plan with the patient.  The patient was provided an opportunity to ask questions and all were answered.  The patient agreed with the plan and demonstrated an understanding of the instructions.  The patient was advised to call back if the symptoms worsen or if the condition fails to improve as anticipated.   Shakita Keir C. Mike Gip, MD, PhD    01/19/2020, 3:24 PM  I, Heywood Footman, am acting as Education administrator for [employer]. Mike Gip, MD, PhD.  I, Kaelie Henigan C. Mike Gip, MD, have reviewed the above documentation for accuracy and completeness, and I agree with the above.

## 2020-01-19 ENCOUNTER — Other Ambulatory Visit: Payer: Self-pay

## 2020-01-19 ENCOUNTER — Inpatient Hospital Stay: Payer: 59 | Attending: Hematology and Oncology | Admitting: Hematology and Oncology

## 2020-01-19 ENCOUNTER — Inpatient Hospital Stay: Payer: 59

## 2020-01-19 ENCOUNTER — Encounter: Payer: Self-pay | Admitting: Hematology and Oncology

## 2020-01-19 VITALS — BP 112/74 | HR 76 | Temp 96.4°F | Resp 16 | Wt 147.5 lb

## 2020-01-19 DIAGNOSIS — E785 Hyperlipidemia, unspecified: Secondary | ICD-10-CM | POA: Insufficient documentation

## 2020-01-19 DIAGNOSIS — E119 Type 2 diabetes mellitus without complications: Secondary | ICD-10-CM | POA: Diagnosis not present

## 2020-01-19 DIAGNOSIS — Z79899 Other long term (current) drug therapy: Secondary | ICD-10-CM | POA: Insufficient documentation

## 2020-01-19 DIAGNOSIS — Z7982 Long term (current) use of aspirin: Secondary | ICD-10-CM | POA: Diagnosis not present

## 2020-01-19 DIAGNOSIS — Z833 Family history of diabetes mellitus: Secondary | ICD-10-CM | POA: Insufficient documentation

## 2020-01-19 DIAGNOSIS — Z8249 Family history of ischemic heart disease and other diseases of the circulatory system: Secondary | ICD-10-CM | POA: Diagnosis not present

## 2020-01-19 DIAGNOSIS — I10 Essential (primary) hypertension: Secondary | ICD-10-CM | POA: Diagnosis not present

## 2020-01-19 DIAGNOSIS — Z8673 Personal history of transient ischemic attack (TIA), and cerebral infarction without residual deficits: Secondary | ICD-10-CM | POA: Diagnosis not present

## 2020-01-19 DIAGNOSIS — Z7984 Long term (current) use of oral hypoglycemic drugs: Secondary | ICD-10-CM | POA: Insufficient documentation

## 2020-01-19 DIAGNOSIS — R778 Other specified abnormalities of plasma proteins: Secondary | ICD-10-CM | POA: Insufficient documentation

## 2020-01-19 LAB — COMPREHENSIVE METABOLIC PANEL
ALT: 27 U/L (ref 0–44)
AST: 27 U/L (ref 15–41)
Albumin: 4.7 g/dL (ref 3.5–5.0)
Alkaline Phosphatase: 68 U/L (ref 38–126)
Anion gap: 9 (ref 5–15)
BUN: 17 mg/dL (ref 6–20)
CO2: 30 mmol/L (ref 22–32)
Calcium: 9.6 mg/dL (ref 8.9–10.3)
Chloride: 99 mmol/L (ref 98–111)
Creatinine, Ser: 0.79 mg/dL (ref 0.44–1.00)
GFR calc Af Amer: >60 mL/min (ref >60)
GFR calc non Af Amer: >60 mL/min (ref >60)
Glucose, Bld: 89 mg/dL (ref 70–99)
Potassium: 3.3 mmol/L — ABNORMAL LOW (ref 3.5–5.1)
Sodium: 138 mmol/L (ref 135–145)
Total Bilirubin: 0.7 mg/dL (ref 0.3–1.2)
Total Protein: 8.4 g/dL — ABNORMAL HIGH (ref 6.5–8.1)

## 2020-01-19 LAB — CBC WITH DIFFERENTIAL/PLATELET
Abs Immature Granulocytes: 0.03 10*3/uL (ref 0.00–0.07)
Basophils Absolute: 0 10*3/uL (ref 0.0–0.1)
Basophils Relative: 0 %
Eosinophils Absolute: 0.2 10*3/uL (ref 0.0–0.5)
Eosinophils Relative: 2 %
HCT: 41 % (ref 36.0–46.0)
Hemoglobin: 13.4 g/dL (ref 12.0–15.0)
Immature Granulocytes: 0 %
Lymphocytes Relative: 28 %
Lymphs Abs: 2.5 10*3/uL (ref 0.7–4.0)
MCH: 27.5 pg (ref 26.0–34.0)
MCHC: 32.7 g/dL (ref 30.0–36.0)
MCV: 84 fL (ref 80.0–100.0)
Monocytes Absolute: 0.5 10*3/uL (ref 0.1–1.0)
Monocytes Relative: 6 %
Neutro Abs: 5.8 10*3/uL (ref 1.7–7.7)
Neutrophils Relative %: 64 %
Platelets: 262 10*3/uL (ref 150–400)
RBC: 4.88 MIL/uL (ref 3.87–5.11)
RDW: 14.4 % (ref 11.5–15.5)
WBC: 9 10*3/uL (ref 4.0–10.5)
nRBC: 0 % (ref 0.0–0.2)

## 2020-01-19 NOTE — Progress Notes (Signed)
Patient pre screened for office appointment, no questions or concerns today. Patient reminded of upcoming appointment time and date. 

## 2020-01-22 DIAGNOSIS — Z7984 Long term (current) use of oral hypoglycemic drugs: Secondary | ICD-10-CM | POA: Diagnosis not present

## 2020-01-22 DIAGNOSIS — I10 Essential (primary) hypertension: Secondary | ICD-10-CM | POA: Diagnosis not present

## 2020-01-22 DIAGNOSIS — R778 Other specified abnormalities of plasma proteins: Secondary | ICD-10-CM | POA: Diagnosis not present

## 2020-01-22 DIAGNOSIS — E119 Type 2 diabetes mellitus without complications: Secondary | ICD-10-CM | POA: Diagnosis not present

## 2020-01-22 DIAGNOSIS — Z8673 Personal history of transient ischemic attack (TIA), and cerebral infarction without residual deficits: Secondary | ICD-10-CM | POA: Diagnosis not present

## 2020-01-22 DIAGNOSIS — E785 Hyperlipidemia, unspecified: Secondary | ICD-10-CM | POA: Diagnosis not present

## 2020-01-22 DIAGNOSIS — Z8249 Family history of ischemic heart disease and other diseases of the circulatory system: Secondary | ICD-10-CM | POA: Diagnosis not present

## 2020-01-22 DIAGNOSIS — Z7982 Long term (current) use of aspirin: Secondary | ICD-10-CM | POA: Diagnosis not present

## 2020-01-22 DIAGNOSIS — Z79899 Other long term (current) drug therapy: Secondary | ICD-10-CM | POA: Diagnosis not present

## 2020-01-22 LAB — KAPPA/LAMBDA LIGHT CHAINS
Kappa free light chain: 23.6 mg/L — ABNORMAL HIGH (ref 3.3–19.4)
Kappa, lambda light chain ratio: 1.41 (ref 0.26–1.65)
Lambda free light chains: 16.7 mg/L (ref 5.7–26.3)

## 2020-01-22 NOTE — Addendum Note (Signed)
Addended by: Sinclair Grooms D on: 01/22/2020 08:59 AM   Modules accepted: Orders

## 2020-01-23 LAB — FREE K+L LT CHAINS,QN,UR
Free Kappa Lt Chains,Ur: 127.89 mg/L — ABNORMAL HIGH (ref 0.63–113.79)
Free Kappa/Lambda Ratio: 8.77 (ref 1.03–31.76)
Free Lambda Lt Chains,Ur: 14.59 mg/L — ABNORMAL HIGH (ref 0.47–11.77)
Total Volume: 2900

## 2020-01-23 LAB — MULTIPLE MYELOMA PANEL, SERUM
Albumin SerPl Elph-Mcnc: 4.2 g/dL (ref 2.9–4.4)
Albumin/Glob SerPl: 1.2 (ref 0.7–1.7)
Alpha 1: 0.2 g/dL (ref 0.0–0.4)
Alpha2 Glob SerPl Elph-Mcnc: 0.7 g/dL (ref 0.4–1.0)
B-Globulin SerPl Elph-Mcnc: 1.2 g/dL (ref 0.7–1.3)
Gamma Glob SerPl Elph-Mcnc: 1.4 g/dL (ref 0.4–1.8)
Globulin, Total: 3.6 g/dL (ref 2.2–3.9)
IgA: 342 mg/dL (ref 87–352)
IgG (Immunoglobin G), Serum: 1422 mg/dL (ref 586–1602)
IgM (Immunoglobulin M), Srm: 45 mg/dL (ref 26–217)
Total Protein ELP: 7.8 g/dL (ref 6.0–8.5)

## 2020-01-23 LAB — UPEP/TP, 24-HR URINE
Albumin, U: 43.7 %
Alpha 1, Urine: 4.4 %
Alpha 2, Urine: 17.3 %
Beta, Urine: 23.2 %
Gamma Globulin, Urine: 11.4 %
Total Protein, Urine-Ur/day: 394 mg/24 hr — ABNORMAL HIGH (ref 30–150)
Total Protein, Urine: 13.6 mg/dL
Total Volume: 2900

## 2020-01-26 DIAGNOSIS — H52223 Regular astigmatism, bilateral: Secondary | ICD-10-CM | POA: Diagnosis not present

## 2020-01-26 DIAGNOSIS — H524 Presbyopia: Secondary | ICD-10-CM | POA: Diagnosis not present

## 2020-01-26 DIAGNOSIS — M545 Low back pain: Secondary | ICD-10-CM | POA: Diagnosis not present

## 2020-01-26 DIAGNOSIS — H5213 Myopia, bilateral: Secondary | ICD-10-CM | POA: Diagnosis not present

## 2020-01-26 DIAGNOSIS — E119 Type 2 diabetes mellitus without complications: Secondary | ICD-10-CM | POA: Diagnosis not present

## 2020-01-31 ENCOUNTER — Encounter: Payer: Self-pay | Admitting: Family

## 2020-01-31 ENCOUNTER — Ambulatory Visit: Payer: 59 | Admitting: Hematology and Oncology

## 2020-01-31 DIAGNOSIS — E1129 Type 2 diabetes mellitus with other diabetic kidney complication: Secondary | ICD-10-CM | POA: Diagnosis not present

## 2020-01-31 DIAGNOSIS — D472 Monoclonal gammopathy: Secondary | ICD-10-CM | POA: Diagnosis not present

## 2020-01-31 DIAGNOSIS — R809 Proteinuria, unspecified: Secondary | ICD-10-CM | POA: Diagnosis not present

## 2020-01-31 NOTE — Progress Notes (Signed)
[facility]  [address], [address] [place], [place] [address] Phone: [phone]  Fax: [phone]   Clinic Day:  02/02/2020  Referring physician: Burnard Hawthorne, FNP  Chief Complaint: Alyssa Schultz is a 54 y.o. female with diabetes and essential hypertension who is seen for review or work up and discussion regarding direction of therapy.   HPI: The patient was last seen in the medical oncology clinic on 01/19/2020 for an initial consult.  SPEP on 12/18/2019 revealed a poorly defined band of restriction restricted protein mobility in the gamma globulins and random protein urine was 36 (5-24).  There was a possible abnormal protein band detected in the beta globulins.  Symptomatically, she denied any B symptoms, bone pain or infections.  Exam was unremarkable.  Work up included a hematocrit 41.0, hemoglobin 13.4, platelets 262,000, WBC 9,000. Potassium was 3.3.  Total protein was 8.4.  M spike was 0 gm/dL.  Kappa free light chain was 23.6, lambda free light chains 16.7 with ratio of 1.41.  Urine kappa free light chain were 127.89, lambda free light chains 14.59 and ratio of 8.77.  24 hour urine total protein was 394 mg/24 hours.  During the interim, she has been doing good. She continues to feels dizzy and lightheaded when taking Crestor 40 mg every other day. She feels much better when taking Crestor 20 mg. She checks her blood pressure and sugar every morning.    Past Medical History:  Diagnosis Date  . Complication of anesthesia    hard to wake in 2017, patient reports she is 30 lb lighter now  . Diabetes mellitus    dx 2014   just take pills  . Hyperlipidemia   . Hypertension   . Neuromuscular disorder [facility])     Past Surgical History:  Procedure Laterality Date  . abalsion    . ablation    . ANTERIOR CERVICAL DECOMP/DISCECTOMY FUSION N/A 09/19/2015   Procedure: ANTERIOR CERVICAL DECOMPRESSION/DISCECTOMY FUSION 2 LEVELS;  Surgeon: Phylliss Bob, MD;   Location: [place];  Service: Orthopedics;  Laterality: N/A;  Anterior cervical decompression fusion, cervical 3-4, cervical 4-5 decompression with instrumentation and allograft  . BACK SURGERY    . FACIAL RECONSTRUCTION SURGERY    . TRANSFORAMINAL LUMBAR INTERBODY FUSION (TLIF) WITH PEDICLE SCREW FIXATION 2 LEVEL Right 07/26/2019   Procedure: RIGHT-SIDED LUMBAR 5 - SACRUM 1 TRANSFORAMINAL LUMBAR INTERBODY FUSION WITH INSTRUMENTATION AND ALLOGRAFT;  Surgeon: Phylliss Bob, MD;  Location: [place];  Service: Orthopedics;  Laterality: Right;    Family History  Problem Relation Age of Onset  . Diabetes Mother   . Diabetes Maternal Grandmother   . Hypertension Maternal Grandfather   . Breast cancer Neg Hx     Social History:  reports that she has never smoked. She has never used smokeless tobacco. She reports current alcohol use. She reports that she does not use drugs. She denies being exposed to any radiations or toxins.  She works for [employer] as a Charity fundraiser.  She lives in [place].  The patient is alone today.  Allergies:  Allergies  Allergen Reactions  . Influenza Vaccine Live     Per Dr - Pt is to not receive vaccine   . Influenza Vaccines     Per Dr - Pt is to not receive vaccine   . Hydrocodone Rash  . Morphine And Related Other (See Comments)    Makes her crazy  . Prednisone     "makes me crazy"  Current Medications: Current Outpatient Medications  Medication Sig Dispense Refill  . Accu-Chek FastClix Lancets MISC USE TO TEST ONCE DAILY 102 each 0  . aspirin EC 81 MG EC tablet Take 1 tablet (81 mg total) by mouth daily. 30 tablet 0  . glucose blood (ACCU-CHEK GUIDE) test strip Accu-Chek Guide strips    . losartan (COZAAR) 25 MG tablet Take 1 tablet by mouth daily.    . meclizine (ANTIVERT) 50 MG tablet Take 1 tablet (50 mg total) by mouth 3 (three) times daily as needed for dizziness. 30 tablet 1  . metFORMIN (GLUCOPHAGE) 500 MG tablet TAKE 1 TABLET BY MOUTH 2 TIMES DAILY  WITH A MEAL 60 tablet 1  . ondansetron (ZOFRAN) 4 MG tablet Take 1 tablet (4 mg total) by mouth every 8 (eight) hours as needed for nausea or vomiting. 20 tablet 0  . rosuvastatin (CRESTOR) 40 MG tablet Take 1 tablet (40 mg total) by mouth daily. 90 tablet 0  . saxagliptin HCl (ONGLYZA) 5 MG TABS tablet Take 1 tablet (5 mg total) by mouth daily. 30 tablet 5  . TRUEPLUS LANCETS 30G MISC USE AS DIRECTED 100 each 5  . valACYclovir (VALTREX) 500 MG tablet Take 2 tablets (1,000 mg total) by mouth 2 (two) times daily. Take prn cold sore on on day 12 tablet 1   No current facility-administered medications for this visit.    Review of Systems  Constitutional: Positive for weight loss (1 lb). Negative for chills, diaphoresis, fever and malaise/fatigue.       Doing good.  HENT: Negative.  Negative for congestion, ear pain, hearing loss, nosebleeds, sinus pain, sore throat and tinnitus.   Eyes: Negative for blurred vision, double vision and photophobia.       Wears contacts.   Respiratory: Negative.  Negative for cough, sputum production, shortness of breath and wheezing.   Cardiovascular: Negative.  Negative for chest pain, palpitations, orthopnea and leg swelling.  Gastrointestinal: Negative.  Negative for abdominal pain, blood in stool, constipation, diarrhea, heartburn, melena, nausea and vomiting.  Genitourinary: Negative for dysuria, frequency and urgency.       Urine is sometimes "foamy".   Musculoskeletal: Negative.  Negative for back pain, falls, joint pain, myalgias and neck pain.  Skin: Negative.  Negative for rash.  Neurological: Positive for dizziness (with cholesterol medication). Negative for tingling, sensory change, speech change, focal weakness, seizures, weakness and headaches.       Lightheaded with cholesterol medication.  Endo/Heme/Allergies: Negative.  Negative for environmental allergies. Does not bruise/bleed easily.  Psychiatric/Behavioral: Negative.  Negative for depression  and memory loss. The patient is not nervous/anxious and does not have insomnia.   All other systems reviewed and are negative.  Performance status (ECOG): 0  Vitals Blood pressure 106/67, pulse 84, weight 148 lb 11.2 oz (67.4 kg), last menstrual period 07/24/2007, SpO2 100 %.   Physical Exam Vitals and nursing note reviewed.  Constitutional:      General: She is not in acute distress.    Appearance: Normal appearance. She is well-developed. She is not diaphoretic.     Interventions: Face mask in place.  HENT:     Head: Normocephalic and atraumatic.  Eyes:     Conjunctiva/sclera: Conjunctivae normal.     Comments: Brown eyes.  Contacts.   Neurological:     Mental Status: She is alert and oriented to person, place, and time.  Psychiatric:        Behavior: Behavior normal.  Thought Content: Thought content normal.        Judgment: Judgment normal.    Hospital Outpatient Visit on 02/01/2020  Component Date Value Ref Range Status  . Cholesterol 02/01/2020 166  0 - 200 mg/dL Final  . Triglycerides 02/01/2020 64  <150 mg/dL Final  . HDL 02/01/2020 87  >40 mg/dL Final  . Total CHOL/HDL Ratio 02/01/2020 1.9  RATIO Final  . VLDL 02/01/2020 13  0 - 40 mg/dL Final  . LDL Cholesterol 02/01/2020 66  0 - 99 mg/dL Final   Comment:        Total Cholesterol/HDL:CHD Risk Coronary Heart Disease Risk Table                     Men   Women  1/2 Average Risk   3.4   3.3  Average Risk       5.0   4.4  2 X Average Risk   9.6   7.1  3 X Average Risk  23.4   11.0        Use the calculated Patient Ratio above and the CHD Risk Table to determine the patient's CHD Risk.        ATP III CLASSIFICATION (LDL):  <100     mg/dL   Optimal  100-129  mg/dL   Near or Above                    Optimal  130-159  mg/dL   Borderline  160-189  mg/dL   High  >190     mg/dL   Very High Performed at [facility], [address], [place], [place] [address]   . Hgb A1c MFr Bld 02/01/2020 5.7*  4.8 - 5.6 % Final   Comment: (NOTE)         Prediabetes: 5.7 - 6.4         Diabetes: >6.4         Glycemic control for adults with diabetes: <7.0   . Mean Plasma Glucose 02/01/2020 117  mg/dL Final   Comment: (NOTE) Performed At: [facility] [place], [place] [id number] Rush Farmer MD AO:[id number]   . Sodium 02/01/2020 141  135 - 145 mmol/L Final  . Potassium 02/01/2020 3.7  3.5 - 5.1 mmol/L Final  . Chloride 02/01/2020 103  98 - 111 mmol/L Final  . CO2 02/01/2020 28  22 - 32 mmol/L Final  . Glucose, Bld 02/01/2020 105* 70 - 99 mg/dL Final   Glucose reference range applies only to samples taken after fasting for at least 8 hours.  . BUN 02/01/2020 15  6 - 20 mg/dL Final  . Creatinine, Ser 02/01/2020 0.74  0.44 - 1.00 mg/dL Final  . Calcium 02/01/2020 9.4  8.9 - 10.3 mg/dL Final  . Total Protein 02/01/2020 8.1  6.5 - 8.1 g/dL Final  . Albumin 02/01/2020 4.4  3.5 - 5.0 g/dL Final  . AST 02/01/2020 21  15 - 41 U/L Final  . ALT 02/01/2020 21  0 - 44 U/L Final  . Alkaline Phosphatase 02/01/2020 63  38 - 126 U/L Final  . Total Bilirubin 02/01/2020 0.2* 0.3 - 1.2 mg/dL Final  . GFR calc non Af Amer 02/01/2020 >60  >60 mL/min Final  . GFR calc Af Amer 02/01/2020 >60  >60 mL/min Final  . Anion gap 02/01/2020 10  5 - 15 Final   Performed at [facility], [address] [place], [place]  [address]    Assessment:  Alyssa Schultz is a 54 y.o. female with an abnormal SPEP and UPEP.  SPEP on 12/18/2019 revealed a poorly defined band of restriction restricted protein mobility in the gamma globulins unlikely to represent a monoclonal protein.  Random protein urine was 36 (5-24).  There was a possible abnormal protein band detected in the beta globulins that may represent a monoclonal immunoglobulin or free light chain (Bence-Jones protein).  Work up on 01/19/2020 revealed a hematocrit 41.0, hemoglobin 13.4, platelets 262,000, WBC 9,000.  M spike was 0 gm/dL.   Kappa free light chains were 23.6, lambda free light chains 16.7 and ratio of 1.41 (normal).  Urine kappa free light chains were 127.89, lambda free light chains 14.59, and ratio of 8.77 (normal).  24 hour urine total protein was 394 mg/24 hours.  She has diabetes, hypertension, h/o TIA, and lumbar spine surgery.  She has not received the COVID-19 vaccine.  Symptomatically, she is doing well.  She denies any bone pain or infections.  Plan: 1.   Review work up. 2.   Abnormal SPEP  Work-up revealed no monoclonal protein.  Reassurance was provided. 3.   RTC prn.  I discussed the assessment and treatment plan with the patient.  The patient was provided an opportunity to ask questions and all were answered.  The patient agreed with the plan and demonstrated an understanding of the instructions.  The patient was advised to call back if the symptoms worsen or if the condition fails to improve as anticipated.  I provided 10 minutes of face-to-face video visit time during this this encounter and > 50% was spent counseling as documented under my assessment and plan.  I provided these services from the [facility] office.   Dashiel Bergquist C. Mike Gip, MD, PhD    02/02/2020, 11:12 AM  I, Selena Batten, am acting as scribe for [employer]. Mike Gip, MD, PhD.  I, Jennah Satchell C. Mike Gip, MD, have reviewed the above documentation for accuracy and completeness, and I agree with the above.

## 2020-02-01 ENCOUNTER — Other Ambulatory Visit
Admission: RE | Admit: 2020-02-01 | Discharge: 2020-02-01 | Disposition: A | Discharge status: Home / Self Care | Payer: 59 | Attending: Surgery | Admitting: Surgery

## 2020-02-01 DIAGNOSIS — E119 Type 2 diabetes mellitus without complications: Secondary | ICD-10-CM | POA: Insufficient documentation

## 2020-02-01 DIAGNOSIS — E785 Hyperlipidemia, unspecified: Secondary | ICD-10-CM | POA: Insufficient documentation

## 2020-02-01 LAB — LIPID PANEL
Cholesterol: 166 mg/dL (ref 0–200)
HDL: 87 mg/dL (ref >40)
LDL Cholesterol: 66 mg/dL (ref 0–99)
Total CHOL/HDL Ratio: 1.9 RATIO
Triglycerides: 64 mg/dL (ref <150)
VLDL: 13 mg/dL (ref 0–40)

## 2020-02-01 LAB — COMPREHENSIVE METABOLIC PANEL
ALT: 21 U/L (ref 0–44)
AST: 21 U/L (ref 15–41)
Albumin: 4.4 g/dL (ref 3.5–5.0)
Alkaline Phosphatase: 63 U/L (ref 38–126)
Anion gap: 10 (ref 5–15)
BUN: 15 mg/dL (ref 6–20)
CO2: 28 mmol/L (ref 22–32)
Calcium: 9.4 mg/dL (ref 8.9–10.3)
Chloride: 103 mmol/L (ref 98–111)
Creatinine, Ser: 0.74 mg/dL (ref 0.44–1.00)
GFR calc Af Amer: >60 mL/min (ref >60)
GFR calc non Af Amer: >60 mL/min (ref >60)
Glucose, Bld: 105 mg/dL — ABNORMAL HIGH (ref 70–99)
Potassium: 3.7 mmol/L (ref 3.5–5.1)
Sodium: 141 mmol/L (ref 135–145)
Total Bilirubin: 0.2 mg/dL — ABNORMAL LOW (ref 0.3–1.2)
Total Protein: 8.1 g/dL (ref 6.5–8.1)

## 2020-02-01 MED FILL — ONGLYZA 5 MG TABLET: 5 | 30 days supply | Qty: 30 | Fill #1

## 2020-02-01 MED FILL — METFORMIN HCL 500 MG TABS: 500 | 30 days supply | Qty: 60 | Fill #1

## 2020-02-01 MED FILL — LOSARTAN POTASSIUM 25 MG TA: 25 | 30 days supply | Qty: 15 | Fill #1

## 2020-02-01 NOTE — Telephone Encounter (Signed)
Called patient to get more info. Confirmed she is doing ok. She cut the dose of her cholesterol medication from 40 mg to 20 mg last night, woke up this morning feel almost 100% better. She is wondering if we can try leaving her on the 20 mg and see how she does. She has a follow up with you in July. She did say if she needs to be seen sooner she can. She also noted that if she starts having any new or acute symptoms, she will be evaluated.

## 2020-02-02 ENCOUNTER — Inpatient Hospital Stay (HOSPITAL_BASED_OUTPATIENT_CLINIC_OR_DEPARTMENT_OTHER): Payer: 59 | Admitting: Hematology and Oncology

## 2020-02-02 ENCOUNTER — Other Ambulatory Visit: Payer: Self-pay

## 2020-02-02 ENCOUNTER — Encounter: Payer: Self-pay | Admitting: Hematology and Oncology

## 2020-02-02 VITALS — BP 106/67 | HR 84 | Wt 148.7 lb

## 2020-02-02 DIAGNOSIS — R778 Other specified abnormalities of plasma proteins: Secondary | ICD-10-CM | POA: Diagnosis not present

## 2020-02-02 DIAGNOSIS — E119 Type 2 diabetes mellitus without complications: Secondary | ICD-10-CM | POA: Diagnosis not present

## 2020-02-02 DIAGNOSIS — Z8673 Personal history of transient ischemic attack (TIA), and cerebral infarction without residual deficits: Secondary | ICD-10-CM | POA: Diagnosis not present

## 2020-02-02 DIAGNOSIS — Z8249 Family history of ischemic heart disease and other diseases of the circulatory system: Secondary | ICD-10-CM | POA: Diagnosis not present

## 2020-02-02 DIAGNOSIS — Z7982 Long term (current) use of aspirin: Secondary | ICD-10-CM | POA: Diagnosis not present

## 2020-02-02 DIAGNOSIS — I10 Essential (primary) hypertension: Secondary | ICD-10-CM | POA: Diagnosis not present

## 2020-02-02 DIAGNOSIS — Z79899 Other long term (current) drug therapy: Secondary | ICD-10-CM | POA: Diagnosis not present

## 2020-02-02 DIAGNOSIS — Z7984 Long term (current) use of oral hypoglycemic drugs: Secondary | ICD-10-CM | POA: Diagnosis not present

## 2020-02-02 DIAGNOSIS — E785 Hyperlipidemia, unspecified: Secondary | ICD-10-CM | POA: Diagnosis not present

## 2020-02-02 LAB — HEMOGLOBIN A1C
Hgb A1c MFr Bld: 5.7 % — ABNORMAL HIGH (ref 4.8–5.6)
Mean Plasma Glucose: 117 mg/dL

## 2020-02-02 NOTE — Telephone Encounter (Signed)
LMTCB

## 2020-02-02 NOTE — Telephone Encounter (Signed)
Confirmed patient is okay. She does not want to move appt up. She is going to try to take the cholesterol medication as directed. She was asking can she do 20 mg every day instead of 40 mg 3 times a week. Advised she needs appt to discuss. Pt stated she will discuss at her appt in July.

## 2020-02-02 NOTE — Progress Notes (Signed)
Patient here for oncology follow-up appointment, expresses no complaints or concerns at this time.    

## 2020-02-02 NOTE — Telephone Encounter (Signed)
Pt stopped taking HTC. Dr Holley Raring increased losartan from 12.5 to 25. Pt states that 40mg  Crestor makes her feel bad and bp is running low. Can pt do 30mg ? Please advise. No appts avail

## 2020-02-09 ENCOUNTER — Ambulatory Visit: Payer: 59

## 2020-02-09 DIAGNOSIS — E559 Vitamin D deficiency, unspecified: Secondary | ICD-10-CM | POA: Diagnosis not present

## 2020-02-09 DIAGNOSIS — E785 Hyperlipidemia, unspecified: Secondary | ICD-10-CM | POA: Diagnosis not present

## 2020-02-09 DIAGNOSIS — E119 Type 2 diabetes mellitus without complications: Secondary | ICD-10-CM | POA: Diagnosis not present

## 2020-02-09 DIAGNOSIS — I10 Essential (primary) hypertension: Secondary | ICD-10-CM | POA: Diagnosis not present

## 2020-02-16 ENCOUNTER — Ambulatory Visit
Admission: RE | Admit: 2020-02-16 | Discharge: 2020-02-16 | Disposition: A | Discharge status: Home / Self Care | Payer: 59 | Source: Ambulatory Visit | Attending: Orthopedic Surgery | Admitting: Orthopedic Surgery

## 2020-02-16 ENCOUNTER — Other Ambulatory Visit: Payer: Self-pay

## 2020-02-16 ENCOUNTER — Other Ambulatory Visit: Payer: 59

## 2020-02-16 DIAGNOSIS — M50122 Cervical disc disorder at C5-C6 level with radiculopathy: Secondary | ICD-10-CM | POA: Diagnosis not present

## 2020-02-16 DIAGNOSIS — M5412 Radiculopathy, cervical region: Secondary | ICD-10-CM | POA: Insufficient documentation

## 2020-02-16 DIAGNOSIS — M50123 Cervical disc disorder at C6-C7 level with radiculopathy: Secondary | ICD-10-CM | POA: Diagnosis not present

## 2020-02-16 DIAGNOSIS — M4802 Spinal stenosis, cervical region: Secondary | ICD-10-CM | POA: Diagnosis not present

## 2020-02-16 DIAGNOSIS — G9589 Other specified diseases of spinal cord: Secondary | ICD-10-CM | POA: Diagnosis not present

## 2020-02-26 ENCOUNTER — Ambulatory Visit: Payer: 59 | Admitting: Family

## 2020-02-26 MED FILL — ONGLYZA 5 MG TABLET: 5 | 30 days supply | Qty: 30 | Fill #2

## 2020-02-26 MED FILL — LOSARTAN POTASSIUM 25 MG TA: 25 | 30 days supply | Qty: 30 | Fill #0

## 2020-02-27 ENCOUNTER — Telehealth (INDEPENDENT_AMBULATORY_CARE_PROVIDER_SITE_OTHER): Payer: 59 | Admitting: Family

## 2020-02-27 ENCOUNTER — Ambulatory Visit: Payer: 59 | Admitting: Family

## 2020-02-27 ENCOUNTER — Encounter: Payer: Self-pay | Admitting: Family

## 2020-02-27 DIAGNOSIS — R809 Proteinuria, unspecified: Secondary | ICD-10-CM

## 2020-02-27 DIAGNOSIS — I10 Essential (primary) hypertension: Secondary | ICD-10-CM | POA: Diagnosis not present

## 2020-02-27 DIAGNOSIS — E785 Hyperlipidemia, unspecified: Secondary | ICD-10-CM | POA: Diagnosis not present

## 2020-02-27 DIAGNOSIS — E119 Type 2 diabetes mellitus without complications: Secondary | ICD-10-CM

## 2020-02-27 MED ORDER — ROSUVASTATIN CALCIUM 20 MG PO TABS: 20.0000 mg | ORAL_TABLET | Freq: Every day | ORAL | 1 refills | Status: DC

## 2020-02-27 NOTE — Assessment & Plan Note (Signed)
Very well controlled, continue losartan

## 2020-02-27 NOTE — Assessment & Plan Note (Signed)
Extremely well controlled, continue current regimen.  Patient is also following with endocrine

## 2020-02-27 NOTE — Progress Notes (Signed)
Virtual Visit via Video Note  I connected with@  on 02/27/20 at  8:30 AM EDT by a video enabled telemedicine application and verified that I am speaking with the correct person using two identifiers.  Location patient: home Location provider:work  Persons participating in the virtual visit: patient, provider  I discussed the limitations of evaluation and management by telemedicine and the availability of in person appointments. The patient expressed understanding and agreed to proceed.   HPI:  Feels well No complaints Exercising twice per day.  Not eating fried foods.   DM- compliant with metformin 500mg , onglyza.  HTN- taking losartan 12.5mg  BID. BP yesterday 102/77, today 99/68. No cp, sob  HLD- 20mg  crestor qpm. If takes 40mg  , feels tired.  Consult with endocrine 02/09/2020.  Continue Metformin 500 mg daily, Onglyza 5 mg daily. Consult with oncology, Dr. Mike Gip for abnormal SPEP.  Work-up revealed no monoclonal protein reassurance was provided. Consult with nephrology, Dr. Holley Raring 01/31/2020 ongoing proteinuria.  Maintained on losartan.  Upcoming appointment with GI for colonoscopy  Declines pneumococcal vaccine  ROS: See pertinent positives and negatives per HPI.  Past Medical History:  Diagnosis Date   Complication of anesthesia    hard to wake in 2017, patient reports she is 30 lb lighter now   Diabetes mellitus    dx 2014   just take pills   Hyperlipidemia    Hypertension    Neuromuscular disorder [facility])     Past Surgical History:  Procedure Laterality Date   abalsion     ablation     ANTERIOR CERVICAL DECOMP/DISCECTOMY FUSION N/A 09/19/2015   Procedure: ANTERIOR CERVICAL DECOMPRESSION/DISCECTOMY FUSION 2 LEVELS;  Surgeon: Phylliss Bob, MD;  Location: [place];  Service: Orthopedics;  Laterality: N/A;  Anterior cervical decompression fusion, cervical 3-4, cervical 4-5 decompression with instrumentation and allograft   BACK SURGERY     FACIAL  RECONSTRUCTION SURGERY     TRANSFORAMINAL LUMBAR INTERBODY FUSION (TLIF) WITH PEDICLE SCREW FIXATION 2 LEVEL Right 07/26/2019   Procedure: RIGHT-SIDED LUMBAR 5 - SACRUM 1 TRANSFORAMINAL LUMBAR INTERBODY FUSION WITH INSTRUMENTATION AND ALLOGRAFT;  Surgeon: Phylliss Bob, MD;  Location: [place];  Service: Orthopedics;  Laterality: Right;    Family History  Problem Relation Age of Onset   Diabetes Mother    Diabetes Maternal Grandmother    Hypertension Maternal Grandfather    Breast cancer Neg Hx        Current Outpatient Medications:    Accu-Chek FastClix Lancets MISC, USE TO TEST ONCE DAILY, Disp: 102 each, Rfl: 0   aspirin EC 81 MG EC tablet, Take 1 tablet (81 mg total) by mouth daily., Disp: 30 tablet, Rfl: 0   glucose blood (ACCU-CHEK GUIDE) test strip, Accu-Chek Guide strips, Disp: , Rfl:    losartan (COZAAR) 25 MG tablet, Take 1 tablet by mouth daily., Disp: , Rfl:    meclizine (ANTIVERT) 50 MG tablet, Take 1 tablet (50 mg total) by mouth 3 (three) times daily as needed for dizziness., Disp: 30 tablet, Rfl: 1   metFORMIN (GLUCOPHAGE) 500 MG tablet, TAKE 1 TABLET BY MOUTH 2 TIMES DAILY WITH A MEAL (Patient taking differently: Take 500 mg by mouth. TAKE 1 TABLET BY MOUTH DAILY WITH A MEAL), Disp: 60 tablet, Rfl: 1   rosuvastatin (CRESTOR) 20 MG tablet, Take 1 tablet (20 mg total) by mouth daily., Disp: 90 tablet, Rfl: 1   saxagliptin HCl (ONGLYZA) 5 MG TABS tablet, Take 1 tablet (5 mg total) by mouth daily., Disp: 30 tablet, Rfl:  5   TRUEPLUS LANCETS 30G MISC, USE AS DIRECTED, Disp: 100 each, Rfl: 5   ondansetron (ZOFRAN) 4 MG tablet, Take 1 tablet (4 mg total) by mouth every 8 (eight) hours as needed for nausea or vomiting. (Patient not taking: Reported on 02/27/2020), Disp: 20 tablet, Rfl: 0   valACYclovir (VALTREX) 500 MG tablet, Take 2 tablets (1,000 mg total) by mouth 2 (two) times daily. Take prn cold sore on on day (Patient not taking: Reported on 02/27/2020), Disp: 12  tablet, Rfl: 1  EXAM:  VITALS per patient if applicable:  GENERAL: alert, oriented, appears well and in no acute distress  HEENT: atraumatic, conjunttiva clear, no obvious abnormalities on inspection of external nose and ears  NECK: normal movements of the head and neck  LUNGS: on inspection no signs of respiratory distress, breathing rate appears normal, no obvious gross SOB, gasping or wheezing  CV: no obvious cyanosis  MS: moves all visible extremities without noticeable abnormality  PSYCH/NEURO: pleasant and cooperative, no obvious depression or anxiety, speech and thought processing grossly intact  ASSESSMENT AND PLAN:  Discussed the following assessment and plan:  Hyperlipidemia, unspecified hyperlipidemia type - Plan: rosuvastatin (CRESTOR) 20 MG tablet  Essential hypertension  Controlled type 2 diabetes mellitus without complication, without long-term current use of insulin (HCC)  Proteinuria, unspecified type Problem List Items Addressed This Visit      Cardiovascular and Mediastinum   Hypertension    Very well controlled, continue losartan      Relevant Medications   rosuvastatin (CRESTOR) 20 MG tablet     Endocrine   Diabetes mellitus type 2, controlled ([place])    Extremely well controlled, continue current regimen.  Patient is also following with endocrine      Relevant Medications   rosuvastatin (CRESTOR) 20 MG tablet     Other   Hyperlipidemia   Relevant Medications   rosuvastatin (CRESTOR) 20 MG tablet   Proteinuria    Improved.  Patient will continue following Dr. Holley Raring.  Will follow         -we discussed possible serious and likely etiologies, options for evaluation and workup, limitations of telemedicine visit vs in person visit, treatment, treatment risks and precautions. Pt prefers to treat via telemedicine empirically rather then risking or undertaking an in person visit at this moment. Patient agrees to seek prompt in person care if  worsening, new symptoms arise, or if is not improving with treatment.   I discussed the assessment and treatment plan with the patient. The patient was provided an opportunity to ask questions and all were answered. The patient agreed with the plan and demonstrated an understanding of the instructions.   The patient was advised to call back or seek an in-person evaluation if the symptoms worsen or if the condition fails to improve as anticipated.   Mable Paris, FNP

## 2020-02-27 NOTE — Assessment & Plan Note (Signed)
Improved.  Patient will continue following Dr. Holley Raring.  Will follow

## 2020-03-01 ENCOUNTER — Ambulatory Visit: Payer: 59

## 2020-03-06 ENCOUNTER — Ambulatory Visit: Payer: 59 | Admitting: Family

## 2020-03-07 ENCOUNTER — Encounter: Payer: Self-pay | Admitting: Family

## 2020-03-12 ENCOUNTER — Encounter: Payer: Self-pay | Admitting: Family

## 2020-03-13 ENCOUNTER — Other Ambulatory Visit: Payer: Self-pay | Admitting: Family

## 2020-03-13 DIAGNOSIS — R809 Proteinuria, unspecified: Secondary | ICD-10-CM

## 2020-03-13 NOTE — Telephone Encounter (Signed)
Called patient and scheduled labs for 03/15/20 at 2:30pm. Patient asks to come in early as she gets off work around 1:30pm.

## 2020-03-15 ENCOUNTER — Other Ambulatory Visit (INDEPENDENT_AMBULATORY_CARE_PROVIDER_SITE_OTHER): Payer: 59

## 2020-03-15 ENCOUNTER — Ambulatory Visit: Payer: 59

## 2020-03-15 DIAGNOSIS — R809 Proteinuria, unspecified: Secondary | ICD-10-CM

## 2020-03-15 DIAGNOSIS — M5412 Radiculopathy, cervical region: Secondary | ICD-10-CM | POA: Diagnosis not present

## 2020-03-15 LAB — MICROALBUMIN / CREATININE URINE RATIO
Creatinine,U: 62.1 mg/dL
Microalb Creat Ratio: 1.1 mg/g (ref 0.0–30.0)
Microalb, Ur: <0.7 mg/dL (ref 0.0–1.9)

## 2020-03-17 ENCOUNTER — Encounter: Payer: Self-pay | Admitting: Family

## 2020-03-20 DIAGNOSIS — R002 Palpitations: Secondary | ICD-10-CM | POA: Diagnosis not present

## 2020-03-20 DIAGNOSIS — I208 Other forms of angina pectoris: Secondary | ICD-10-CM | POA: Diagnosis not present

## 2020-03-20 DIAGNOSIS — G459 Transient cerebral ischemic attack, unspecified: Secondary | ICD-10-CM | POA: Diagnosis not present

## 2020-03-20 DIAGNOSIS — R42 Dizziness and giddiness: Secondary | ICD-10-CM | POA: Diagnosis not present

## 2020-03-26 ENCOUNTER — Encounter: Payer: Self-pay | Admitting: Family

## 2020-03-27 ENCOUNTER — Encounter: Payer: Self-pay | Admitting: Family

## 2020-03-27 ENCOUNTER — Telehealth (INDEPENDENT_AMBULATORY_CARE_PROVIDER_SITE_OTHER): Payer: 59 | Admitting: Family

## 2020-03-27 DIAGNOSIS — Z7189 Other specified counseling: Secondary | ICD-10-CM

## 2020-03-27 DIAGNOSIS — R809 Proteinuria, unspecified: Secondary | ICD-10-CM | POA: Diagnosis not present

## 2020-03-27 DIAGNOSIS — I10 Essential (primary) hypertension: Secondary | ICD-10-CM | POA: Diagnosis not present

## 2020-03-27 DIAGNOSIS — Z7185 Encounter for immunization safety counseling: Secondary | ICD-10-CM | POA: Insufficient documentation

## 2020-03-27 NOTE — Assessment & Plan Note (Signed)
Tolerating losartan 12.5mg  BID. Will continue.

## 2020-03-27 NOTE — Progress Notes (Signed)
Virtual Visit via Video Note  I connected with@  on 03/27/20 at  8:00 AM EDT by a video enabled telemedicine application and verified that I am speaking with the correct person using two identifiers.  Location patient: home Location provider:work  Persons participating in the virtual visit: patient, provider  I discussed the limitations of evaluation and management by telemedicine and the availability of in person appointments. The patient expressed understanding and agreed to proceed.   HPI: Feels well today Primary reason for appointment was to discuss recent urine lab and COVID 19 vaccine.  She has not taken her influenza vaccine from 8 years previously had a exemption for her PCP.  Years ago recalls "flu symptoms" after vaccine although does not recall exactly what symptoms after having flu vaccine. No h/o anaphylaxis, trouble breathing, trouble swallowing to medication PO or injectable.  She gets a rash with hydrocodone.   Never had colonoscopy.  No known allergy to peg.    HTN- feeling well 12.90m BID. No cp, dizziness.   Proteinuria improved.   ROS: See pertinent positives and negatives per HPI.  Past Medical History:  Diagnosis Date   Complication of anesthesia    hard to wake in 2017, patient reports she is 30 lb lighter now   Diabetes mellitus    dx 2014   just take pills   Hyperlipidemia    Hypertension    Neuromuscular disorder ([facility]     Past Surgical History:  Procedure Laterality Date   abalsion     ablation     ANTERIOR CERVICAL DECOMP/DISCECTOMY FUSION N/A 09/19/2015   Procedure: ANTERIOR CERVICAL DECOMPRESSION/DISCECTOMY FUSION 2 LEVELS;  Surgeon: MPhylliss Bob MD;  Location: [facility]  Service: Orthopedics;  Laterality: N/A;  Anterior cervical decompression fusion, cervical 3-4, cervical 4-5 decompression with instrumentation and allograft   BACK SURGERY     FACIAL RECONSTRUCTION SURGERY     TRANSFORAMINAL LUMBAR INTERBODY FUSION (TLIF) WITH  PEDICLE SCREW FIXATION 2 LEVEL Right 07/26/2019   Procedure: RIGHT-SIDED LUMBAR 5 - SACRUM 1 TRANSFORAMINAL LUMBAR INTERBODY FUSION WITH INSTRUMENTATION AND ALLOGRAFT;  Surgeon: DPhylliss Bob MD;  [facility]  Service: Orthopedics;  Laterality: Right;    Family History  Problem Relation Age of Onset   Diabetes Mother    Diabetes Maternal Grandmother    Hypertension Maternal Grandfather    Breast cancer Neg Hx       Current Outpatient Medications:    Accu-Chek FastClix Lancets MISC, USE TO TEST ONCE DAILY, Disp: 102 each, Rfl: 0   aspirin EC 81 MG EC tablet, Take 1 tablet (81 mg total) by mouth daily., Disp: 30 tablet, Rfl: 0   Blood Glucose Monitoring Suppl (FREESTYLE FREEDOM LITE) w/Device KIT, , Disp: , Rfl:    glucose blood (ACCU-CHEK GUIDE) test strip, Accu-Chek Guide strips, Disp: , Rfl:    meclizine (ANTIVERT) 50 MG tablet, Take 1 tablet (50 mg total) by mouth 3 (three) times daily as needed for dizziness., Disp: 30 tablet, Rfl: 1   metFORMIN (GLUCOPHAGE) 500 MG tablet, TAKE 1 TABLET BY MOUTH 2 TIMES DAILY WITH A MEAL (Patient taking differently: Take 500 mg by mouth. TAKE 1 TABLET BY MOUTH DAILY WITH A MEAL), Disp: 60 tablet, Rfl: 1   ondansetron (ZOFRAN) 4 MG tablet, Take 1 tablet (4 mg total) by mouth every 8 (eight) hours as needed for nausea or vomiting., Disp: 20 tablet, Rfl: 0   rosuvastatin (CRESTOR) 20 MG tablet, Take 1 tablet (20 mg total) by mouth daily., Disp:  90 tablet, Rfl: 1   saxagliptin HCl (ONGLYZA) 5 MG TABS tablet, Take 1 tablet (5 mg total) by mouth daily., Disp: 30 tablet, Rfl: 5   TRUEPLUS LANCETS 30G MISC, USE AS DIRECTED, Disp: 100 each, Rfl: 5   valACYclovir (VALTREX) 500 MG tablet, Take 2 tablets (1,000 mg total) by mouth 2 (two) times daily. Take prn cold sore on on day, Disp: 12 tablet, Rfl: 1   losartan (COZAAR) 25 MG tablet, Take 1 tablet by mouth daily., Disp: , Rfl:   EXAM:  VITALS per patient if applicable:  GENERAL: alert,  oriented, appears well and in no acute distress  HEENT: atraumatic, conjunttiva clear, no obvious abnormalities on inspection of external nose and ears  NECK: normal movements of the head and neck  LUNGS: on inspection no signs of respiratory distress, breathing rate appears normal, no obvious gross SOB, gasping or wheezing  CV: no obvious cyanosis  MS: moves all visible extremities without noticeable abnormality  PSYCH/NEURO: pleasant and cooperative, no obvious depression or anxiety, speech and thought processing grossly intact  ASSESSMENT AND PLAN:  Discussed the following assessment and plan:  Essential hypertension  Proteinuria, unspecified type  Vaccine counseling Problem List Items Addressed This Visit      Cardiovascular and Mediastinum   Hypertension    Tolerating losartan 12.72m BID. Will continue.         Other   Proteinuria    Pleased as has improved on losartan. We will continue to trend along with Dr LHolley Raring      Vaccine counseling    No known contraindication to mrna vaccine as this time. She had adverse effects  from influenza vaccine years ago however she wasn't sure what specific symptoms she had. Generally felt unwell as she describes at that time after vaccine. No h/o anaphylaxis. 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 Patient was in agreement and all questions answered.          -we discussed possible serious and likely etiologies, options for evaluation and workup, limitations of telemedicine visit vs in person visit, treatment, treatment risks and precautions. Pt prefers to treat via telemedicine empirically rather then risking or undertaking an in person visit at this moment. Patient agrees to seek prompt in person care if worsening, new symptoms arise, or if is not improving with treatment.   I discussed the assessment and treatment plan with the patient. The  patient was provided an opportunity to ask questions and all were answered. The patient agreed with the plan and demonstrated an understanding of the instructions.   The patient was advised to call back or seek an in-person evaluation if the symptoms worsen or if the condition fails to improve as anticipated.   MMable Paris FNP

## 2020-03-27 NOTE — Assessment & Plan Note (Addendum)
No known contraindication to mrna vaccine as this time. She had adverse effects  from influenza vaccine years ago however she wasn't sure what specific symptoms she had. Generally felt unwell as she describes at that time after vaccine. No h/o anaphylaxis. 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 Patient was in agreement and all questions answered.

## 2020-03-27 NOTE — Assessment & Plan Note (Addendum)
Pleased as has improved on losartan. We will continue to trend along with Dr Holley Raring.

## 2020-04-01 ENCOUNTER — Other Ambulatory Visit: Payer: Self-pay

## 2020-04-01 ENCOUNTER — Encounter: Payer: Self-pay | Admitting: Nurse Practitioner

## 2020-04-01 ENCOUNTER — Ambulatory Visit: Payer: 59 | Admitting: Nurse Practitioner

## 2020-04-01 VITALS — BP 104/68 | HR 82 | Temp 97.6°F | Ht 66.0 in | Wt 156.0 lb

## 2020-04-01 DIAGNOSIS — H6691 Otitis media, unspecified, right ear: Secondary | ICD-10-CM

## 2020-04-01 DIAGNOSIS — H6690 Otitis media, unspecified, unspecified ear: Secondary | ICD-10-CM | POA: Insufficient documentation

## 2020-04-01 MED ORDER — AMOXICILLIN-POT CLAVULANATE 875-125 MG PO TABS: 1.0000 | ORAL_TABLET | Freq: Two times a day (BID) | ORAL | 0 refills | Status: AC

## 2020-04-01 NOTE — Progress Notes (Signed)
Established Patient Office Visit  Subjective:  Patient ID: Alyssa Schultz, female    DOB: 03-19-1966  Age: 54 y.o. MRN: [id number]  CC:  Chief Complaint  Patient presents with  . Acute Visit    lump on neck    HPI Alyssa Schultz is a 54 year old diabetic patient who presents for a lump on her right neck that she noticed last week.  It is enlarged and tender. She has ear discomfort as well.  She thought maybe she had an ear infection and came in to get examined.  She has had no cold symptoms, sore throat, cough, headache, fevers or chills.  She has not had a Covid vaccine yet.  She does not get frequent ear infections.  Past Medical History:  Diagnosis Date  . Complication of anesthesia    hard to wake in 2017, patient reports she is 30 lb lighter now  . Diabetes mellitus    dx 2014   just take pills  . Hyperlipidemia   . Hypertension   . Neuromuscular disorder [facility])     Past Surgical History:  Procedure Laterality Date  . abalsion    . ablation    . ANTERIOR CERVICAL DECOMP/DISCECTOMY FUSION N/A 09/19/2015   Procedure: ANTERIOR CERVICAL DECOMPRESSION/DISCECTOMY FUSION 2 LEVELS;  Surgeon: Phylliss Bob, MD;  Location: [facility];  Service: Orthopedics;  Laterality: N/A;  Anterior cervical decompression fusion, cervical 3-4, cervical 4-5 decompression with instrumentation and allograft  . BACK SURGERY    . FACIAL RECONSTRUCTION SURGERY    . TRANSFORAMINAL LUMBAR INTERBODY FUSION (TLIF) WITH PEDICLE SCREW FIXATION 2 LEVEL Right 07/26/2019   Procedure: RIGHT-SIDED LUMBAR 5 - SACRUM 1 TRANSFORAMINAL LUMBAR INTERBODY FUSION WITH INSTRUMENTATION AND ALLOGRAFT;  Surgeon: Phylliss Bob, MD;  Location: [facility];  Service: Orthopedics;  Laterality: Right;    Family History  Problem Relation Age of Onset  . Diabetes Mother   . Diabetes Maternal Grandmother   . Hypertension Maternal Grandfather   . Breast cancer Neg Hx     Social History   Socioeconomic History  . Marital status: Single     Spouse name: Not on file  . Number of children: Not on file  . Years of education: Not on file  . Highest education level: Not on file  Occupational History  . Not on file  Tobacco Use  . Smoking status: Never Smoker  . Smokeless tobacco: Never Used  Vaping Use  . Vaping Use: Never used  Substance and Sexual Activity  . Alcohol use: Yes    Comment: Occasional wine  . Drug use: No  . Sexual activity: Yes  Other Topics Concern  . 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 Difficulty of Paying Living Expenses:   Food Insecurity:   . Worried About Charity fundraiser in the Last Year:   . Arboriculturist in the Last Year:   Transportation Needs:   . Film/video editor (Medical):   Marland Kitchen Lack of Transportation (Non-Medical):   Physical Activity:   . Days of Exercise per Week:   . Minutes of Exercise per Session:   Stress:   . Feeling of Stress :   Social Connections:   . Frequency of  Communication with Friends and Family:   . Frequency of Social Gatherings with Friends and Family:   . Attends Religious Services:   . Active Member of Clubs or Organizations:   . Attends Archivist Meetings:   Marland Kitchen Marital Status:   Intimate Partner Violence:   . Fear of Current or Ex-Partner:   . Emotionally Abused:   Marland Kitchen Physically Abused:   . Sexually Abused:     Outpatient Medications Prior to Visit  Medication Sig Dispense Refill  . Accu-Chek FastClix Lancets MISC USE TO TEST ONCE DAILY 102 each 0  . aspirin EC 81 MG EC tablet Take 1 tablet (81 mg total) by mouth daily. 30 tablet 0  . Blood Glucose Monitoring Suppl (FREESTYLE FREEDOM LITE) w/Device KIT     . glucose blood (ACCU-CHEK GUIDE) test strip Accu-Chek Guide strips    . meclizine (ANTIVERT) 50 MG tablet Take 1 tablet (50 mg total)  by mouth 3 (three) times daily as needed for dizziness. 30 tablet 1  . metFORMIN (GLUCOPHAGE) 500 MG tablet TAKE 1 TABLET BY MOUTH 2 TIMES DAILY WITH A MEAL (Patient taking differently: Take 500 mg by mouth. TAKE 1 TABLET BY MOUTH DAILY WITH A MEAL) 60 tablet 1  . ondansetron (ZOFRAN) 4 MG tablet Take 1 tablet (4 mg total) by mouth every 8 (eight) hours as needed for nausea or vomiting. 20 tablet 0  . rosuvastatin (CRESTOR) 20 MG tablet Take 1 tablet (20 mg total) by mouth daily. 90 tablet 1  . saxagliptin HCl (ONGLYZA) 5 MG TABS tablet Take 1 tablet (5 mg total) by mouth daily. 30 tablet 5  . TRUEPLUS LANCETS 30G MISC USE AS DIRECTED 100 each 5  . valACYclovir (VALTREX) 500 MG tablet Take 2 tablets (1,000 mg total) by mouth 2 (two) times daily. Take prn cold sore on on day 12 tablet 1  . losartan (COZAAR) 25 MG tablet Take 1 tablet by mouth daily.     No facility-administered medications prior to visit.    Allergies  Allergen Reactions  . Influenza Vaccine Live     Per Dr - Pt is to not receive vaccine   . Influenza Vaccines     Per Dr - Pt is to not receive vaccine   . Hydrocodone Rash  . Morphine And Related Other (See Comments)    Makes her crazy  . Prednisone     "makes me crazy"    Review of Systems Positive review of systems as noted in history of present illness otherwise negative.   Objective:    Physical Exam Vitals reviewed.  Constitutional:      Appearance: Normal appearance.  HENT:     Head: Normocephalic.     Right Ear: Hearing, ear canal and external ear normal. Tympanic membrane is injected.     Left Ear: Hearing, tympanic membrane, ear canal and external ear normal.  Eyes:     Pupils: Pupils are equal, round, and reactive to light.  Neck:     Comments: Rt anterior cervical lymph node enlarged and tender.  Cardiovascular:     Rate and Rhythm: Normal rate and regular rhythm.     Pulses: Normal pulses.     Heart sounds: Normal heart sounds.  Pulmonary:      Effort: Pulmonary effort is normal.     Breath sounds: Normal breath sounds. No wheezing.  Musculoskeletal:        General: Normal range of motion.     Cervical back: Normal range  of motion.  Lymphadenopathy:     Head:     Right side of head: No submental adenopathy.     Cervical: Cervical adenopathy present.  Skin:    General: Skin is warm and dry.  Neurological:     General: No focal deficit present.     Mental Status: She is alert and oriented to person, place, and time.  Psychiatric:        Mood and Affect: Mood normal.     BP 104/68 (BP Location: Left Arm, Patient Position: Sitting, Cuff Size: Normal)   Pulse 82   Temp 97.6 F (36.4 C) (Oral)   Ht _0  (1.676 m)   Wt 156 lb (70.8 kg)   LMP 07/24/2007 Comment: ablation in 2008-pt reports she went through menopause after this  SpO2 97%   BMI 25.18 kg/m  Wt Readings from Last 3 Encounters:  04/01/20 156 lb (70.8 kg)  03/27/20 155 lb (70.3 kg)  02/27/20 153 lb (69.4 kg)     Health Maintenance Due  Topic Date Due  . COVID-19 Vaccine (1) Never done    There are no preventive care reminders to display for this patient.  Lab Results  Component Value Date   TSH 2.446 12/07/2017   Lab Results  Component Value Date   WBC 9.0 01/19/2020   HGB 13.4 01/19/2020   HCT 41.0 01/19/2020   MCV 84.0 01/19/2020   PLT 262 01/19/2020   Lab Results  Component Value Date   NA 141 02/01/2020   K 3.7 02/01/2020   CO2 28 02/01/2020   GLUCOSE 105 (H) 02/01/2020   BUN 15 02/01/2020   CREATININE 0.74 02/01/2020   BILITOT 0.2 (L) 02/01/2020   ALKPHOS 63 02/01/2020   AST 21 02/01/2020   ALT 21 02/01/2020   PROT 8.1 02/01/2020   ALBUMIN 4.4 02/01/2020   CALCIUM 9.4 02/01/2020   ANIONGAP 10 02/01/2020   GFR 92.69 12/03/2016   Lab Results  Component Value Date   CHOL 166 02/01/2020   Lab Results  Component Value Date   HDL 87 02/01/2020   Lab Results  Component Value Date   LDLCALC 66 02/01/2020   Lab Results    Component Value Date   TRIG 64 02/01/2020   Lab Results  Component Value Date   CHOLHDL 1.9 02/01/2020   Lab Results  Component Value Date   HGBA1C 5.7 (H) 02/01/2020      Assessment & Plan:   Problem List Items Addressed This Visit      Nervous and Auditory   Acute otitis media - Primary   Relevant Medications   amoxicillin-clavulanate (AUGMENTIN) 875-125 MG tablet      Meds ordered this encounter  Medications  . amoxicillin-clavulanate (AUGMENTIN) 875-125 MG tablet    Sig: Take 1 tablet by mouth 2 (two) times daily for 7 days.    Dispense:  14 tablet    Refill:  0    Order Specific Question:   Supervising Provider    Answer:   Einar [facility]heasant [[id number]]   Begin Augmentin for right ear infection.   Take probiotic such as Florastor as directed.  Eat yogurt to help prevent C. difficile diarrhea. Tylenol as needed for pain.   Please follow-up office visit in 1 week to check your ear and swelling.  Call us back if no improvement in 2 -3 days.   Follow-up: Return in about 1 week (around 04/08/2020).  This visit occurred during the SARS-CoV-2 public health emergency.  Safety  protocols were in place, including screening questions prior to the visit, additional usage of staff PPE, and extensive cleaning of exam room while observing appropriate contact time as indicated for disinfecting solutions.    Denice Paradise, NP

## 2020-04-01 NOTE — Patient Instructions (Addendum)
Begin Augmentin for right ear infection.   Take probiotic such as Florastor as directed.  Eat yogurt to help prevent C. difficile diarrhea.  Please follow-up office visit in 1 week to check your ear and swelling.  Call us back if no improvement in 2 -3 days.   Otitis Media, Adult  Otitis media occurs when there is inflammation and fluid in the middle ear. Your middle ear is a part of the ear that contains bones for hearing as well as air that helps send sounds to your brain. What are the causes? This condition is caused by a blockage in the eustachian tube. This tube drains fluid from the ear to the back of the nose (nasopharynx). A blockage in this tube can be caused by an object or by swelling (edema) in the tube. Problems that can cause a blockage include:  A cold or other upper respiratory infection.  Allergies.  An irritant, such as tobacco smoke.  Enlarged adenoids. The adenoids are areas of soft tissue located high in the back of the throat, behind the nose and the roof of the mouth.  A mass in the nasopharynx.  Damage to the ear caused by pressure changes (barotrauma). What are the signs or symptoms? Symptoms of this condition include:  Ear pain.  A fever.  Decreased hearing.  A headache.  Tiredness (lethargy).  Fluid leaking from the ear.  Ringing in the ear. How is this diagnosed? This condition is diagnosed with a physical exam. During the exam your health care provider will use an instrument called an otoscope to look into your ear and check for redness, swelling, and fluid. He or she will also ask about your symptoms. Your health care provider may also order tests, such as:  A test to check the movement of the eardrum (pneumatic otoscopy). This test is done by squeezing a small amount of air into the ear.  A test that changes air pressure in the middle ear to check how well the eardrum moves and whether the eustachian tube is working (tympanogram). How is  this treated? This condition usually goes away on its own within 3-5 days. But if the condition is caused by a bacteria infection and does not go away own its own, or keeps coming back, your health care provider may:  Prescribe antibiotic medicines to treat the infection.  Prescribe or recommend medicines to control pain. Follow these instructions at home:  Take over-the-counter and prescription medicines only as told by your health care provider.  If you were prescribed an antibiotic medicine, take it as told by your health care provider. Do not stop taking the antibiotic even if you start to feel better.  Keep all follow-up visits as told by your health care provider. This is important. Contact a health care provider if:  You have bleeding from your nose.  There is a lump on your neck.  You are not getting better in 5 days.  You feel worse instead of better. Get help right away if:  You have severe pain that is not controlled with medicine.  You have swelling, redness, or pain around your ear.  You have stiffness in your neck.  A part of your face is paralyzed.  The bone behind your ear (mastoid) is tender when you touch it.  You develop a severe headache. Summary  Otitis media is redness, soreness, and swelling of the middle ear.  This condition usually goes away on its own within 3-5 days.  If  the problem does not go away in 3-5 days, your health care provider may prescribe or recommend medicines to treat your symptoms.  If you were prescribed an antibiotic medicine, take it as told by your health care provider. This information is not intended to replace advice given to you by your health care provider. Make sure you discuss any questions you have with your health care provider. Document Revised: 07/16/2017 Document Reviewed: 07/24/2016 Elsevier Patient Education  2020 [employer]

## 2020-04-02 MED FILL — LOSARTAN POTASSIUM 25 MG TA: 25 | 30 days supply | Qty: 30 | Fill #1

## 2020-04-02 MED FILL — ONGLYZA 5 MG TABLET: 5 | 30 days supply | Qty: 30 | Fill #3

## 2020-04-03 ENCOUNTER — Encounter: Payer: Self-pay | Admitting: Nurse Practitioner

## 2020-04-05 ENCOUNTER — Encounter: Payer: Self-pay | Admitting: Family

## 2020-04-05 NOTE — Telephone Encounter (Signed)
She needs a video visit to address her concerns.

## 2020-04-08 NOTE — Telephone Encounter (Signed)
Please ask her to follow up with [name] in a video if she still has concerns.

## 2020-04-10 ENCOUNTER — Other Ambulatory Visit: Payer: Self-pay

## 2020-04-11 ENCOUNTER — Telehealth: Payer: Self-pay | Admitting: Allergy & Immunology

## 2020-04-11 ENCOUNTER — Other Ambulatory Visit: Payer: Self-pay | Admitting: Family

## 2020-04-11 DIAGNOSIS — Z887 Allergy status to serum and vaccine status: Secondary | ICD-10-CM

## 2020-04-11 NOTE — Telephone Encounter (Signed)
Routing to Johnette to see if we can get her into a testing slot. I believe we are full on Monday in [place].   Salvatore Marvel, MD [facility]

## 2020-04-11 NOTE — Telephone Encounter (Signed)
Received referral from Dr. Vidal Schwalbe regarding patient reaction to 1st COVID vaccine. Patient is needing an exemption.   Please advise if component testing is needed or an initial visit.

## 2020-04-12 ENCOUNTER — Other Ambulatory Visit: Payer: Self-pay

## 2020-04-12 ENCOUNTER — Ambulatory Visit: Payer: 59 | Admitting: Nurse Practitioner

## 2020-04-12 ENCOUNTER — Encounter: Payer: Self-pay | Admitting: Nurse Practitioner

## 2020-04-12 VITALS — BP 108/68 | HR 72 | Temp 98.3°F | Ht 66.0 in | Wt 154.0 lb

## 2020-04-12 DIAGNOSIS — N760 Acute vaginitis: Secondary | ICD-10-CM

## 2020-04-12 DIAGNOSIS — R300 Dysuria: Secondary | ICD-10-CM | POA: Diagnosis not present

## 2020-04-12 DIAGNOSIS — R590 Localized enlarged lymph nodes: Secondary | ICD-10-CM

## 2020-04-12 DIAGNOSIS — N898 Other specified noninflammatory disorders of vagina: Secondary | ICD-10-CM | POA: Insufficient documentation

## 2020-04-12 LAB — POCT URINALYSIS DIPSTICK
Bilirubin, UA: NEGATIVE
Blood, UA: NEGATIVE
Glucose, UA: NEGATIVE
Ketones, UA: NEGATIVE
Leukocytes, UA: NEGATIVE
Nitrite, UA: NEGATIVE
Protein, UA: NEGATIVE
Spec Grav, UA: 1.015 (ref 1.010–1.025)
Urobilinogen, UA: 0.2 E.U./dL
pH, UA: 6.5 (ref 5.0–8.0)

## 2020-04-12 MED ORDER — FLUCONAZOLE 150 MG PO TABS: 150.0000 mg | ORAL_TABLET | Freq: Once | ORAL | 0 refills | Status: AC

## 2020-04-12 NOTE — Progress Notes (Signed)
Established Patient Office Visit  Subjective:  Patient ID: Alyssa Schultz, female    DOB: [birth year]  Age: 54 y.o. MRN: [id number]  CC:  Chief Complaint  Patient presents with   Follow-up    possible UTI    HPI Alyssa Schultz presents for follow-up of concerns of a possible UTI.  She completed Augmentin for right ear redness and right small hard cervical adenopathy.  The ear feels fine.  The lymph node is unchanged.  She did develop vaginal itching and thinks she had a yeast infection.  She drank water and diet cranberry juice.  No dysuria hematuria frequency.  She has developed no diarrhea.  Past Medical History:  Diagnosis Date   Complication of anesthesia    hard to wake in 2017, patient reports she is 30 lb lighter now   Diabetes mellitus    dx 2014   just take pills   Hyperlipidemia    Hypertension    Neuromuscular disorder [facility])     Past Surgical History:  Procedure Laterality Date   abalsion     ablation     ANTERIOR CERVICAL DECOMP/DISCECTOMY FUSION N/A 09/19/2015   Procedure: ANTERIOR CERVICAL DECOMPRESSION/DISCECTOMY FUSION 2 LEVELS;  Surgeon: Phylliss Bob, MD;  Location: [place];  Service: Orthopedics;  Laterality: N/A;  Anterior cervical decompression fusion, cervical 3-4, cervical 4-5 decompression with instrumentation and allograft   BACK SURGERY     FACIAL RECONSTRUCTION SURGERY     TRANSFORAMINAL LUMBAR INTERBODY FUSION (TLIF) WITH PEDICLE SCREW FIXATION 2 LEVEL Right 07/26/2019   Procedure: RIGHT-SIDED LUMBAR 5 - SACRUM 1 TRANSFORAMINAL LUMBAR INTERBODY FUSION WITH INSTRUMENTATION AND ALLOGRAFT;  Surgeon: Phylliss Bob, MD;  Location: [place];  Service: Orthopedics;  Laterality: Right;    Family History  Problem Relation Age of Onset   Diabetes Mother    Diabetes Maternal Grandmother    Hypertension Maternal Grandfather    Breast cancer Neg Hx     Social History   Socioeconomic History   Marital status: Single    Spouse name: Not on file    Number of children: Not on file   Years of education: Not on file   Highest education level: Not on file  Occupational History   Not on file  Tobacco Use   Smoking status: Never Smoker   Smokeless tobacco: Never Used  Vaping Use   Vaping Use: Never used  Substance and Sexual Activity   Alcohol use: Yes    Comment: Occasional wine   Drug use: No   Sexual activity: Yes  Other Topics Concern   Not on file  Social History Narrative   Works for [employer], 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[employer]; doing PT for cancer center            Social Determinants of Health   Financial Resource Strain:    Difficulty of Paying Living Expenses: Not on file  Food Insecurity:    Worried About Charity fundraiser in the Last Year: Not on file   [employer] of Food in the Last Year: Not on file  Transportation Needs:    Lack of Transportation (Medical): Not on file   Lack of Transportation (Non-Medical): Not on file  Physical Activity:    Days of Exercise per Week: Not on file   Minutes of Exercise per Session: Not on file  Stress:    Feeling of Stress : Not on file  Social Connections:  Frequency of Communication with Friends and Family: Not on file   Frequency of Social Gatherings with Friends and Family: Not on file   Attends Religious Services: Not on file   Active Member of Clubs or Organizations: Not on file   Attends Archivist Meetings: Not on file   Marital Status: Not on file  Intimate Partner Violence:    Fear of Current or Ex-Partner: Not on file   Emotionally Abused: Not on file   Physically Abused: Not on file   Sexually Abused: Not on file    Outpatient Medications Prior to Visit  Medication Sig Dispense Refill   Accu-Chek FastClix Lancets MISC USE TO TEST ONCE DAILY 102 each 0   aspirin EC 81 MG EC tablet Take 1 tablet (81 mg total) by mouth daily. 30 tablet 0   Blood Glucose Monitoring Suppl  (FREESTYLE FREEDOM LITE) w/Device KIT      glucose blood (ACCU-CHEK GUIDE) test strip Accu-Chek Guide strips     meclizine (ANTIVERT) 50 MG tablet Take 1 tablet (50 mg total) by mouth 3 (three) times daily as needed for dizziness. 30 tablet 1   metFORMIN (GLUCOPHAGE) 500 MG tablet TAKE 1 TABLET BY MOUTH 2 TIMES DAILY WITH A MEAL (Patient taking differently: Take 500 mg by mouth. TAKE 1 TABLET BY MOUTH DAILY WITH A MEAL) 60 tablet 1   ondansetron (ZOFRAN) 4 MG tablet Take 1 tablet (4 mg total) by mouth every 8 (eight) hours as needed for nausea or vomiting. 20 tablet 0   rosuvastatin (CRESTOR) 20 MG tablet Take 1 tablet (20 mg total) by mouth daily. 90 tablet 1   saxagliptin HCl (ONGLYZA) 5 MG TABS tablet Take 1 tablet (5 mg total) by mouth daily. 30 tablet 5   TRUEPLUS LANCETS 30G MISC USE AS DIRECTED 100 each 5   valACYclovir (VALTREX) 500 MG tablet Take 2 tablets (1,000 mg total) by mouth 2 (two) times daily. Take prn cold sore on on day 12 tablet 1   losartan (COZAAR) 25 MG tablet Take 1 tablet by mouth daily.     No facility-administered medications prior to visit.    Allergies  Allergen Reactions   Influenza Vaccine Live     Per Dr - Pt is to not receive vaccine    Influenza Vaccines     Per Dr - Pt is to not receive vaccine    Hydrocodone Rash   Morphine And Related Other (See Comments)    Makes her crazy   Prednisone     "makes me crazy"    Review of Systems  Constitutional: Negative for chills, fever and unexpected weight change.  HENT: Negative for dental problem, ear pain, facial swelling, hearing loss, postnasal drip, rhinorrhea, sinus pain, sore throat and trouble swallowing.   Eyes: Negative.   Respiratory: Negative for cough and shortness of breath.   Cardiovascular: Negative for leg swelling.  Gastrointestinal: Negative for abdominal pain.  Genitourinary: Negative for dysuria.  Skin: Negative for rash.      Objective:    Physical Exam Vitals  reviewed.  Constitutional:      Appearance: Normal appearance. She is normal weight.  HENT:     Right Ear: Tympanic membrane normal. There is no impacted cerumen.     Left Ear: Tympanic membrane normal. There is no impacted cerumen.  Eyes:     Pupils: Pupils are equal, round, and reactive to light.  Neck:     Comments: Rt anterior cervical hard small node- no  change after Augmentin.  Cardiovascular:     Rate and Rhythm: Normal rate and regular rhythm.     Pulses: Normal pulses.     Heart sounds: Normal heart sounds.  Pulmonary:     Effort: Pulmonary effort is normal.     Breath sounds: Normal breath sounds.  Abdominal:     Palpations: Abdomen is soft.     Tenderness: There is no abdominal tenderness.  Musculoskeletal:     Cervical back: Normal range of motion.  Lymphadenopathy:     Cervical: Cervical adenopathy present.  Skin:    General: Skin is warm and dry.  Neurological:     General: No focal deficit present.     Mental Status: She is alert and oriented to person, place, and time.  Psychiatric:        Mood and Affect: Mood normal.        Behavior: Behavior normal.     BP 108/68 (BP Location: Left Arm, Patient Position: Sitting, Cuff Size: Normal)    Pulse 72    Temp 98.3 F (36.8 C) (Oral)    Ht _0  (1.676 m)    Wt 154 lb (69.9 kg)    LMP 07/24/2007 Comment: ablation in 2008-pt reports she went through menopause after this   SpO2 98%    BMI 24.86 kg/m  Wt Readings from Last 3 Encounters:  04/12/20 154 lb (69.9 kg)  04/01/20 156 lb (70.8 kg)  03/27/20 155 lb (70.3 kg)   Pulse Readings from Last 3 Encounters:  04/12/20 72  04/01/20 82  03/27/20 88    BP Readings from Last 3 Encounters:  04/12/20 108/68  04/01/20 104/68  03/27/20 100/72    Lab Results  Component Value Date   CHOL 166 02/01/2020   HDL 87 02/01/2020   LDLCALC 66 02/01/2020   LDLDIRECT 153.0 10/02/2013   TRIG 64 02/01/2020   CHOLHDL 1.9 02/01/2020      There are no preventive care  reminders to display for this patient.  There are no preventive care reminders to display for this patient.  Lab Results  Component Value Date   TSH 2.446 12/07/2017   Lab Results  Component Value Date   WBC 9.0 01/19/2020   HGB 13.4 01/19/2020   HCT 41.0 01/19/2020   MCV 84.0 01/19/2020   PLT 262 01/19/2020   Lab Results  Component Value Date   NA 141 02/01/2020   K 3.7 02/01/2020   CO2 28 02/01/2020   GLUCOSE 105 (H) 02/01/2020   BUN 15 02/01/2020   CREATININE 0.74 02/01/2020   BILITOT 0.2 (L) 02/01/2020   ALKPHOS 63 02/01/2020   AST 21 02/01/2020   ALT 21 02/01/2020   PROT 8.1 02/01/2020   ALBUMIN 4.4 02/01/2020   CALCIUM 9.4 02/01/2020   ANIONGAP 10 02/01/2020   GFR 92.69 12/03/2016   Lab Results  Component Value Date   CHOL 166 02/01/2020   Lab Results  Component Value Date   HDL 87 02/01/2020   Lab Results  Component Value Date   LDLCALC 66 02/01/2020   Lab Results  Component Value Date   TRIG 64 02/01/2020   Lab Results  Component Value Date   CHOLHDL 1.9 02/01/2020   Lab Results  Component Value Date   HGBA1C 5.7 (H) 02/01/2020      Assessment & Plan:   Problem List Items Addressed This Visit      Immune and Lymphatic   Cervical adenopathy    There is a  hard round lymph node present right anterior cervical. No change after a course of Augmentin for suspected right otitis media. Patient will make an appointment with Dr. Ladene Artist where she is already established.        Genitourinary   Acute vaginitis     Other   Dysuria - Primary   Relevant Orders   Urine Culture (Completed)   POCT Urinalysis Dipstick (Completed)   Urine Microscopic Only (Completed)      Meds ordered this encounter  Medications   fluconazole (DIFLUCAN) 150 MG tablet    Sig: Take 1 tablet (150 mg total) by mouth once for 1 dose.    Dispense:  1 tablet    Refill:  0    Order Specific Question:   Supervising Provider    Answer:   Einar Pheasant [id number]    No sign of UTI on the UA.  One dose of Diflucan has been ordered for possible vaginal yeast infection after Augmentin.  Return if you continue to have symptoms.   Call for a soon appointment with  Dr. Kathyrn Sheriff regarding the lymph node right neck.   Follow-up: Return if symptoms worsen or fail to improve.   This visit occurred during the SARS-CoV-2 public health emergency.  Safety protocols were in place, including screening questions prior to the visit, additional usage of staff PPE, and extensive cleaning of exam room while observing appropriate contact time as indicated for disinfecting solutions.   Denice Paradise, NP

## 2020-04-12 NOTE — Patient Instructions (Addendum)
No sign of UTI on the UA.  One dose of Diflucan has been ordered for possible vaginal yeast infection after Augmentin.  Return if you continue to have symptoms.   Call for a soon appointment with  Dr. Kathyrn Sheriff regarding the lymph node right neck. Your ear exams are normal.     Lymphadenopathy  Lymphadenopathy means that your lymph glands are swollen or larger than normal (enlarged). Lymph glands, also called lymph nodes, are collections of tissue that filter bacteria, viruses, and waste from your bloodstream. They are part of your body's disease-fighting system (immune system), which protects your body from germs. There may be different causes of lymphadenopathy, depending on where it is in your body. Some types go away on their own. Lymphadenopathy can occur anywhere that you have lymph glands, including these areas:  Neck (cervical lymphadenopathy).  Chest (mediastinal lymphadenopathy).  Lungs (hilar lymphadenopathy).  Underarms (axillary lymphadenopathy).  Groin (inguinal lymphadenopathy). When your immune system responds to germs, infection-fighting cells and fluid build up in your lymph glands. This causes some swelling and enlargement. If the lymph glands do not go back to normal after you have an infection or disease, your health care provider may do tests. These tests help to monitor your condition and find the reason why the glands are still swollen and enlarged. Follow these instructions at home:  Get plenty of rest.  Take over-the-counter and prescription medicines only as told by your health care provider. Your health care provider may recommend over-the-counter medicines for pain.  If directed, apply heat to swollen lymph glands as often as told by your health care provider. Use the heat source that your health care provider recommends, such as a moist heat pack or a heating pad. ? Place a towel between your skin and the heat source. ? Leave the heat on for 20-30  minutes. ? Remove the heat if your skin turns bright red. This is especially important if you are unable to feel pain, heat, or cold. You may have a greater risk of getting burned.  Check your affected lymph glands every day for changes. Check other lymph gland areas as told by your health care provider. Check for changes such as: ? More swelling. ? Sudden increase in size. ? Redness or pain. ? Hardness.  Keep all follow-up visits as told by your health care provider. This is important. Contact a health care provider if you have:  Swelling that gets worse or spreads to other areas.  Problems with breathing.  Lymph glands that: ? Are still swollen after 2 weeks. ? Have suddenly gotten bigger. ? Are red, painful, or hard.  A fever or chills.  Fatigue.  A sore throat.  Pain in your abdomen.  Weight loss.  Night sweats. Get help right away if you have:  Fluid leaking from an enlarged lymph gland.  Severe pain.  Chest pain.  Shortness of breath. Summary  Lymphadenopathy means that your lymph glands are swollen or larger than normal (enlarged).  Lymph glands (also called lymph nodes) are collections of tissue that filter bacteria, viruses, and waste from the bloodstream. They are part of your body's disease-fighting system (immune system).  Lymphadenopathy can occur anywhere that you have lymph glands.  If your enlarged and swollen lymph glands do not go back to normal after you have an infection or disease, your health care provider may do tests to monitor your condition and find the reason why the glands are still swollen and enlarged.  Check your  affected lymph glands every day for changes. Check other lymph gland areas as told by your health care provider. This information is not intended to replace advice given to you by your health care provider. Make sure you discuss any questions you have with your health care provider. Document Revised: 07/16/2017 Document  Reviewed: 06/18/2017 Elsevier Patient Education  [place].  Dysuria Dysuria is pain or discomfort while urinating. The pain or discomfort may be felt in the part of your body that drains urine from the bladder (urethra) or in the surrounding tissue of the genitals. The pain may also be felt in the groin area, lower abdomen, or lower back. You may have to urinate frequently or have the sudden feeling that you have to urinate (urgency). Dysuria can affect both men and women, but it is more common in women. Dysuria can be caused by many different things, including:  Urinary tract infection.  Kidney stones or bladder stones.  Certain sexually transmitted infections (STIs), such as chlamydia.  Dehydration.  Inflammation of the tissues of the vagina.  Use of certain medicines.  Use of certain soaps or scented products that cause irritation. Follow these instructions at home: General instructions  Watch your condition for any changes.  Urinate often. Avoid holding urine for long periods of time.  After a bowel movement or urination, women should cleanse from front to back, using each tissue only once.  Urinate after sexual intercourse.  Keep all follow-up visits as told by your health care provider. This is important.  If you had any tests done to find the cause of dysuria, it is up to you to get your test results. Ask your health care provider, or the department that is doing the test, when your results will be ready. Eating and drinking   Drink enough fluid to keep your urine pale yellow.  Avoid caffeine, tea, and alcohol. They can irritate the bladder and make dysuria worse. In men, alcohol may irritate the prostate. Medicines  Take over-the-counter and prescription medicines only as told by your health care provider.  If you were prescribed an antibiotic medicine, take it as told by your health care provider. Do not stop taking the antibiotic even if you start to  feel better. Contact a health care provider if:  You have a fever.  You develop pain in your back or sides.  You have nausea or vomiting.  You have blood in your urine.  You are not urinating as often as you usually do. Get help right away if:  Your pain is severe and not relieved with medicines.  You cannot eat or drink without vomiting.  You are confused.  You have a rapid heartbeat while at rest.  You have shaking or chills.  You feel extremely weak. Summary  Dysuria is pain or discomfort while urinating. Many different conditions can lead to dysuria.  If you have dysuria, you may have to urinate frequently or have the sudden feeling that you have to urinate (urgency).  Watch your condition for any changes. Keep all follow-up visits as told by your health care provider.  Make sure that you urinate often and drink enough fluid to keep your urine pale yellow. This information is not intended to replace advice given to you by your health care provider. Make sure you discuss any questions you have with your health care provider. Document Revised: 07/16/2017 Document Reviewed: 05/20/2017 Elsevier Patient Education  [place].

## 2020-04-13 LAB — URINALYSIS, MICROSCOPIC ONLY
Bacteria, UA: NONE SEEN /HPF
Hyaline Cast: NONE SEEN /LPF
RBC / HPF: NONE SEEN /HPF (ref 0–2)
Squamous Epithelial / HPF: NONE SEEN /HPF (ref <=5)
WBC, UA: NONE SEEN /HPF (ref 0–5)

## 2020-04-13 LAB — URINE CULTURE
MICRO NUMBER:: 10881908
Result:: NO GROWTH
SPECIMEN QUALITY:: ADEQUATE

## 2020-04-15 ENCOUNTER — Encounter: Payer: Self-pay | Admitting: Nurse Practitioner

## 2020-04-15 NOTE — Telephone Encounter (Signed)
Can you read this one and follow up. Please and thank you Johnette

## 2020-04-15 NOTE — Assessment & Plan Note (Signed)
There is a hard round lymph node present right anterior cervical. No change after a course of Augmentin for suspected right otitis media. Patient will make an appointment with Dr. Ladene Artist where she is already established.

## 2020-04-16 NOTE — Telephone Encounter (Signed)
I called the patient on 04/15/20. She was going to call me back about the September 17th testing day.

## 2020-04-19 DIAGNOSIS — L040 Acute lymphadenitis of face, head and neck: Secondary | ICD-10-CM | POA: Diagnosis not present

## 2020-04-23 NOTE — Telephone Encounter (Signed)
Followed up with patient regarding testing. Patient states she is exempt from the vaccine and will check with her PCP to see if she still needs to get the testing. Patient states she is far away and is not interested unless her doctor recommends.

## 2020-04-28 ENCOUNTER — Telehealth: Payer: 59 | Admitting: Emergency Medicine

## 2020-04-28 DIAGNOSIS — H5789 Other specified disorders of eye and adnexa: Secondary | ICD-10-CM

## 2020-04-28 MED ORDER — OFLOXACIN 0.3 % OP SOLN
1.0000 [drp] | Freq: Four times a day (QID) | OPHTHALMIC | 0 refills | Status: DC
Start: 2020-04-28 — End: 2020-07-19

## 2020-04-28 NOTE — Progress Notes (Signed)
Time spent: 10 min  E-Visit for [facility]   We are sorry that you are not feeling well.  Here is how we plan to help!  Based on what you have shared with me it looks like you have conjunctivitis.  Conjunctivitis is a common inflammatory or infectious condition of the eye that is often referred to as "pink eye".  In most cases it is contagious (viral or bacterial). However, not all conjunctivitis requires antibiotics (ex. Allergic).  We have made appropriate suggestions for you based upon your presentation.  I have prescribed Oflaxacin 1-2 drops 4 times a day times 5 days   Pink eye can be highly contagious.  It is typically spread through direct contact with secretions, or contaminated objects or surfaces that one may have touched.  Strict handwashing is suggested with soap and water is urged.  If not available, use alcohol based had sanitizer.  Avoid unnecessary touching of the eye.  If you wear contact lenses, you will need to refrain from wearing them until you see no white discharge from the eye for at least 24 hours after being on medication.  You should see symptom improvement in 1-2 days after starting the medication regimen.  If symptoms are not improving in 2 days please see your primary care provider or eye doctor.   I recommend avoiding patients who may be high risk for infection complications,  I will write you a work note for 2 days.   I recommend you STOP using the loteprednol eye drops. These are steroid drops that MAY help with inflammation but typically should only be used under direction of an eye specialist.  Home Care:  Wash your hands often!  Do not wear your contacts until you complete your treatment plan.  Avoid sharing towels, bed linen, personal items with a person who has pink eye.  See attention for anyone in your home with similar symptoms.  Get Help Right Away If:  Your symptoms do not improve.  You develop blurred or loss of vision.  Your symptoms worsen  (increased discharge, pain or redness)  Your e-visit answers were reviewed by a board certified advanced clinical practitioner to complete your personal care plan.  Depending on the condition, your plan could have included both over the counter or prescription medications.  If there is a problem please reply  once you have received a response from your provider.  Your safety is important to [place].  If you have drug allergies check your prescription carefully.    You can use MyChart to ask questions about todays visit, request a non-urgent call back, or ask for a work or school excuse for 24 hours related to this e-Visit. If it has been greater than 24 hours you will need to follow up with your provider, or enter a new e-Visit to address those concerns.   You will get an e-mail in the next two days asking about your experience.  I hope that your e-visit has been valuable and will speed your recovery. Thank you for using e-visits.   I hope you feel better,   Carmon Sails, PA-C [facility]

## 2020-04-29 ENCOUNTER — Other Ambulatory Visit: Payer: Self-pay

## 2020-04-29 ENCOUNTER — Other Ambulatory Visit: Payer: Self-pay | Admitting: Family

## 2020-04-29 DIAGNOSIS — E785 Hyperlipidemia, unspecified: Secondary | ICD-10-CM

## 2020-04-29 DIAGNOSIS — H18212 Corneal edema secondary to contact lens, left eye: Secondary | ICD-10-CM | POA: Diagnosis not present

## 2020-04-29 MED ORDER — ROSUVASTATIN CALCIUM 20 MG PO TABS: 20.0000 mg | ORAL_TABLET | Freq: Every day | ORAL | 1 refills | Status: DC

## 2020-04-29 MED FILL — METFORMIN HCL 500 MG TABS: 500 | 30 days supply | Qty: 60 | Fill #0

## 2020-04-29 MED FILL — ONGLYZA 5 MG TABLET: 5 | 30 days supply | Qty: 30 | Fill #4

## 2020-04-29 MED FILL — LOSARTAN POTASSIUM 25 MG TA: 25 | 30 days supply | Qty: 30 | Fill #2

## 2020-05-14 ENCOUNTER — Telehealth: Payer: Self-pay | Admitting: Family

## 2020-05-14 ENCOUNTER — Other Ambulatory Visit: Payer: Self-pay | Admitting: Family

## 2020-05-14 ENCOUNTER — Encounter: Payer: Self-pay | Admitting: Family

## 2020-05-14 DIAGNOSIS — E785 Hyperlipidemia, unspecified: Secondary | ICD-10-CM

## 2020-05-14 MED ORDER — ROSUVASTATIN CALCIUM 20 MG PO TABS: 20.0000 mg | ORAL_TABLET | Freq: Every day | ORAL | 1 refills | Status: DC

## 2020-05-14 NOTE — Telephone Encounter (Signed)
Pharmacy called they haven't received the rosuvastatin (CRESTOR) 20 MG tablet rx yet and pt needs the refill  Please send to [facility]

## 2020-05-14 NOTE — Addendum Note (Signed)
Addended by: Elpidio Galea T on: 05/14/2020 10:53 AM   Modules accepted: Orders

## 2020-05-15 ENCOUNTER — Other Ambulatory Visit: Payer: Self-pay | Admitting: Family

## 2020-05-15 DIAGNOSIS — E785 Hyperlipidemia, unspecified: Secondary | ICD-10-CM

## 2020-05-15 MED ORDER — ROSUVASTATIN CALCIUM 20 MG PO TABS: 20.0000 mg | ORAL_TABLET | Freq: Every day | ORAL | 1 refills | Status: DC

## 2020-05-21 MED FILL — ROSUVASTATIN CALCIUM 20 MG: 20 | 90 days supply | Qty: 90 | Fill #0

## 2020-05-30 MED FILL — FREESTYLE LITE TEST STRIP: 90 days supply | Qty: 200 | Fill #1

## 2020-05-31 ENCOUNTER — Encounter: Payer: Self-pay | Admitting: Family

## 2020-06-03 ENCOUNTER — Telehealth: Payer: Self-pay | Admitting: Family

## 2020-06-03 DIAGNOSIS — E119 Type 2 diabetes mellitus without complications: Secondary | ICD-10-CM

## 2020-06-03 NOTE — Telephone Encounter (Signed)
It appears patient is due. [place] with me ordering?

## 2020-06-03 NOTE — Telephone Encounter (Signed)
LMTCB

## 2020-06-03 NOTE — Telephone Encounter (Signed)
Please call pt back about putting orders in for A1C-pt gets labwork done at her job at the [employer] [facility]. Please advise @ work number [phone] until 4:30

## 2020-06-03 NOTE — Telephone Encounter (Signed)
Pt will do trial stop of onglyza. She will also have a1c checked to see where she is at.

## 2020-06-03 NOTE — Telephone Encounter (Signed)
Call pt  onglyza is not longer  Being filled by her insurance, im not sure why this happened.  They prefer [place] to use another DPP4 Januvia. Her a1c is so well controlled, does she want to trial stop the onglyza and we follow her a1c?  Please see mychart message she sent regarding refills as well  Lab Results  Component Value Date   HGBA1C 5.7 (H) 02/01/2020

## 2020-06-03 NOTE — Telephone Encounter (Signed)
Call pt a1c ordered Let me know if she wants to trial stop onglyza

## 2020-06-04 ENCOUNTER — Other Ambulatory Visit: Payer: Self-pay

## 2020-06-04 MED ORDER — FREESTYLE LITE TEST VI STRP: ORAL_STRIP | 4 refills | Status: DC

## 2020-06-05 ENCOUNTER — Other Ambulatory Visit: Payer: Self-pay

## 2020-06-05 ENCOUNTER — Ambulatory Visit: Payer: 59 | Admitting: Family

## 2020-06-05 ENCOUNTER — Encounter: Payer: Self-pay | Admitting: Family

## 2020-06-05 ENCOUNTER — Other Ambulatory Visit
Admission: RE | Admit: 2020-06-05 | Discharge: 2020-06-05 | Disposition: A | Discharge status: Home / Self Care | Payer: 59 | Attending: Family | Admitting: Family

## 2020-06-05 DIAGNOSIS — E785 Hyperlipidemia, unspecified: Secondary | ICD-10-CM | POA: Insufficient documentation

## 2020-06-05 DIAGNOSIS — E119 Type 2 diabetes mellitus without complications: Secondary | ICD-10-CM | POA: Insufficient documentation

## 2020-06-05 LAB — LIPID PANEL
Cholesterol: 148 mg/dL (ref 0–200)
HDL: 70 mg/dL (ref >40)
LDL Cholesterol: 70 mg/dL (ref 0–99)
Total CHOL/HDL Ratio: 2.1 RATIO
Triglycerides: 40 mg/dL (ref <150)
VLDL: 8 mg/dL (ref 0–40)

## 2020-06-05 NOTE — Addendum Note (Signed)
Addended by: Milbert Coulter on: 06/05/2020 07:46 AM   Modules accepted: Orders

## 2020-06-05 NOTE — Telephone Encounter (Signed)
Pt said she also needs a lipid panel added to the lab order. She got them to draw for today. She said she forgot to mention this before.

## 2020-06-05 NOTE — Telephone Encounter (Signed)
I LM that Lipid Panel was ordered for patient.

## 2020-06-06 ENCOUNTER — Encounter: Payer: Self-pay | Admitting: Family

## 2020-06-10 ENCOUNTER — Other Ambulatory Visit: Payer: Self-pay

## 2020-06-10 DIAGNOSIS — E119 Type 2 diabetes mellitus without complications: Secondary | ICD-10-CM

## 2020-06-12 ENCOUNTER — Other Ambulatory Visit
Admission: RE | Admit: 2020-06-12 | Discharge: 2020-06-12 | Disposition: A | Discharge status: Home / Self Care | Payer: 59 | Source: Ambulatory Visit | Attending: Family | Admitting: Family

## 2020-06-12 DIAGNOSIS — Z131 Encounter for screening for diabetes mellitus: Secondary | ICD-10-CM | POA: Insufficient documentation

## 2020-06-13 LAB — HEMOGLOBIN A1C
Hgb A1c MFr Bld: 6.2 % — ABNORMAL HIGH (ref 4.8–5.6)
Mean Plasma Glucose: 131 mg/dL

## 2020-06-17 DIAGNOSIS — E1129 Type 2 diabetes mellitus with other diabetic kidney complication: Secondary | ICD-10-CM | POA: Diagnosis not present

## 2020-06-17 DIAGNOSIS — R809 Proteinuria, unspecified: Secondary | ICD-10-CM | POA: Diagnosis not present

## 2020-06-18 ENCOUNTER — Encounter: Payer: Self-pay | Admitting: Family

## 2020-06-19 ENCOUNTER — Other Ambulatory Visit: Payer: Self-pay | Admitting: Family

## 2020-06-19 DIAGNOSIS — E119 Type 2 diabetes mellitus without complications: Secondary | ICD-10-CM

## 2020-06-19 MED ORDER — SAXAGLIPTIN HCL 5 MG PO TABS: 5.0000 mg | ORAL_TABLET | Freq: Every day | ORAL | 1 refills | Status: DC

## 2020-06-21 ENCOUNTER — Other Ambulatory Visit: Payer: Self-pay | Admitting: Family

## 2020-06-21 DIAGNOSIS — E119 Type 2 diabetes mellitus without complications: Secondary | ICD-10-CM

## 2020-06-21 MED ORDER — SITAGLIPTIN PHOSPHATE 25 MG PO TABS: 25.0000 mg | ORAL_TABLET | Freq: Every day | ORAL | 1 refills | Status: DC

## 2020-06-25 MED FILL — METFORMIN HCL 500 MG TABS: 500 | 30 days supply | Qty: 60 | Fill #1

## 2020-06-25 MED FILL — JANUVIA 25 MG TABLET: 25 | 30 days supply | Qty: 30 | Fill #0

## 2020-06-25 MED FILL — LOSARTAN POTASSIUM 25 MG TA: 25 | 30 days supply | Qty: 30 | Fill #3

## 2020-07-04 DIAGNOSIS — M25562 Pain in left knee: Secondary | ICD-10-CM | POA: Diagnosis not present

## 2020-07-19 ENCOUNTER — Ambulatory Visit: Payer: 59 | Admitting: Family

## 2020-07-19 ENCOUNTER — Other Ambulatory Visit (HOSPITAL_COMMUNITY)
Admission: RE | Admit: 2020-07-19 | Discharge: 2020-07-19 | Disposition: A | Discharge status: Home / Self Care | Payer: 59 | Source: Ambulatory Visit | Attending: Family | Admitting: Family

## 2020-07-19 ENCOUNTER — Other Ambulatory Visit: Payer: Self-pay

## 2020-07-19 ENCOUNTER — Encounter: Payer: Self-pay | Admitting: Family

## 2020-07-19 VITALS — BP 102/64 | HR 76 | Temp 97.8°F | Ht 66.0 in | Wt 160.4 lb

## 2020-07-19 DIAGNOSIS — E785 Hyperlipidemia, unspecified: Secondary | ICD-10-CM

## 2020-07-19 DIAGNOSIS — E119 Type 2 diabetes mellitus without complications: Secondary | ICD-10-CM

## 2020-07-19 DIAGNOSIS — I10 Essential (primary) hypertension: Secondary | ICD-10-CM | POA: Diagnosis not present

## 2020-07-19 DIAGNOSIS — R300 Dysuria: Secondary | ICD-10-CM

## 2020-07-19 NOTE — Patient Instructions (Signed)
Plenty of water Will await urine culture  Nice to see you!

## 2020-07-19 NOTE — Progress Notes (Signed)
Subjective:    Patient ID: Alyssa Schultz, female    DOB: [birth year], 54 y.o.   MRN: [id number]  CC: [name] is a 54 y.o. female who presents today for follow up.   HPI: Complains of  Dysuria which started 3 days ago, unchanged.  No fever, flank pain, hematuria. Since she is here and she worries this may be an early infection, she would like urine to be checked.  Drinking diet cranberry juice and plenty of water.  She has also noticed sllight vaginal odor and would like to be checked for 'BV'. No vaginal pain, vaginal itching. She would like to self swab for this.   DM- compliant with januvia 51m and metformin 5036mbid. FBG 99,102.  Last a1c 6.2 ( from 5.7)   HLD- compliant with crestor. LDL 70  CKD- follows with Dr LaHolley Raring appointment last month. Proteinuria stable. Maintain on losartan 2530mD.   Walks 3 miles per day. No cp.   Colonoscopy is scheduled April 2022.   HISTORY:  Past Medical History:  Diagnosis Date  . Complication of anesthesia    hard to wake in 2017, patient reports she is 30 lb lighter now  . Diabetes mellitus    dx 2014   just take pills  . Hyperlipidemia   . Hypertension   . [facility] ([facility]  Past Surgical History:  Procedure Laterality Date  . abalsion    . ablation    . ANTERIOR CERVICAL DECOMP/DISCECTOMY FUSION N/A 09/19/2015   Procedure: ANTERIOR CERVICAL DECOMPRESSION/DISCECTOMY FUSION 2 LEVELS;  Surgeon: MarPhylliss BobD;  [facility] [place] [place];  Laterality: N/A;  Anterior cervical decompression fusion, cervical 3-4, cervical 4-5 decompression with instrumentation and allograft  . BACK SURGERY    . FACIAL RECONSTRUCTION SURGERY    . TRANSFORAMINAL LUMBAR INTERBODY FUSION (TLIF) WITH PEDICLE SCREW FIXATION 2 LEVEL Right 07/26/2019   Procedure: RIGHT-SIDED LUMBAR 5 - SACRUM 1 TRANSFORAMINAL LUMBAR INTERBODY FUSION WITH INSTRUMENTATION AND ALLOGRAFT;  Surgeon: DumPhylliss BobD;  [place] [facility] [place];  Laterality: Right;   Family History  Problem Relation Age of Onset  . Diabetes Mother   . Diabetes Maternal Grandmother   . Hypertension Maternal Grandfather   . Breast cancer Neg Hx     Allergies: Influenza vaccine live, Influenza vaccines, Hydrocodone, Morphine and related, and Prednisone Current Outpatient Medications on File Prior to Visit  Medication Sig Dispense Refill  . Accu-Chek FastClix Lancets MISC USE TO TEST ONCE DAILY 102 each 0  . aspirin EC 81 MG EC tablet Take 1 tablet (81 mg total) by mouth daily. 30 tablet 0  . Blood Glucose Monitoring Suppl (FREESTYLE FREEDOM LITE) w/Device KIT     . glucose blood (FREESTYLE LITE) test strip Used to check blood sugars once a day. 100 each 4  . meclizine (ANTIVERT) 50 MG tablet Take 1 tablet (50 mg total) by mouth 3 (three) times daily as needed for dizziness. 30 tablet 1  . metFORMIN (GLUCOPHAGE) 500 MG tablet TAKE 1 TABLET BY MOUTH 2 TIMES DAILY WITH A MEAL 60 tablet 1  . rosuvastatin (CRESTOR) 20 MG tablet Take 1 tablet (20 mg total) by mouth daily. 90 tablet 1  . sitaGLIPtin (JANUVIA) 25 MG tablet Take 1 tablet (25 mg total) by mouth daily. 90 tablet 1  . TRUEPLUS LANCETS 30G MISC USE AS DIRECTED 100 each 5  . valACYclovir (VALTREX) 500 MG tablet Take 2 tablets (1,000 mg total)  by mouth 2 (two) times daily. Take prn cold sore on on day 12 tablet 1   No current facility-administered medications on file prior to visit.    Social History   Tobacco Use  . Smoking status: Never Smoker  . Smokeless tobacco: Never Used  Vaping Use  . Vaping Use: Never used  Substance Use Topics  . Alcohol use: Yes    Comment: Occasional wine  . Drug use: No    Review of Systems  Constitutional: Negative for chills and fever.  Respiratory: Negative for cough.   Cardiovascular: Negative for chest pain and palpitations.  Gastrointestinal: Negative for nausea and vomiting.  Genitourinary: Negative for flank pain.        Objective:    BP 102/64   Pulse 76   Temp 97.8 F (36.6 C)   Ht _0  (1.676 m)   Wt 160 lb 6.4 oz (72.8 kg)   LMP 07/24/2007 Comment: ablation in 2008-pt reports she went through menopause after this  SpO2 97%   BMI 25.89 kg/m  BP Readings from Last 3 Encounters:  07/19/20 102/64  04/12/20 108/68  04/01/20 104/68   Wt Readings from Last 3 Encounters:  07/19/20 160 lb 6.4 oz (72.8 kg)  04/12/20 154 lb (69.9 kg)  04/01/20 156 lb (70.8 kg)    Physical Exam Vitals reviewed.  Constitutional:      Appearance: She is well-developed.  Eyes:     Conjunctiva/sclera: Conjunctivae normal.  Cardiovascular:     Rate and Rhythm: Normal rate and regular rhythm.     Pulses: Normal pulses.     Heart sounds: Normal heart sounds.  Pulmonary:     Effort: Pulmonary effort is normal.     Breath sounds: Normal breath sounds. No wheezing, rhonchi or rales.  Skin:    General: Skin is warm and dry.  Neurological:     Mental Status: She is alert.  Psychiatric:        Speech: Speech normal.        Behavior: Behavior normal.        Thought Content: Thought content normal.        Assessment & Plan:   Problem List Items Addressed This Visit      Cardiovascular and Mediastinum   Hypertension    Controlled. Continue losartan 29m        Endocrine   Diabetes mellitus type 2, controlled ([place]    Prediabetic, well controlled. Continue januvia 280mand metformin 50038m        Other   Dysuria - Primary    Pending urine studies. Patient declines empiric treatment for UTI or BV. Will await results of all.       Relevant Orders   Cervicovaginal ancillary only   Microalbumin / creatinine urine ratio   Urinalysis, Routine w reflex microscopic   Urine Culture   Hyperlipidemia    Controlled. Continue crestor 47m14m       I have discontinued LisaCristopher Perurain's ondansetron and ofloxacin. I am also having her maintain her meclizine, TRUEplus Lancets 30G, Accu-Chek FastClix  Lancets, aspirin, valACYclovir, FreeStyle Freedom Lite, metFORMIN, rosuvastatin, FREESTYLE LITE, and sitaGLIPtin.   No orders of the defined types were placed in this encounter.   Return precautions given.   Risks, benefits, and alternatives of the medications and treatment plan prescribed today were discussed, and patient expressed understanding.   Education regarding symptom management and diagnosis given to patient on AVS.  Continue to follow with  Burnard Hawthorne, FNP for routine health maintenance.   Cephus Richer and I agreed with plan.   Mable Paris, FNP

## 2020-07-19 NOTE — Assessment & Plan Note (Signed)
Pending urine studies. Patient declines empiric treatment for UTI or BV. Will await results of all.

## 2020-07-19 NOTE — Assessment & Plan Note (Signed)
Controlled. Continue losartan 25mg 

## 2020-07-19 NOTE — Assessment & Plan Note (Signed)
Controlled. Continue crestor 20mg 

## 2020-07-19 NOTE — Assessment & Plan Note (Signed)
Prediabetic, well controlled. Continue januvia 25mg  and metformin 500mg .

## 2020-07-20 LAB — URINE CULTURE
MICRO NUMBER:: 11274765
Result:: NO GROWTH
SPECIMEN QUALITY:: ADEQUATE

## 2020-07-20 LAB — URINALYSIS, ROUTINE W REFLEX MICROSCOPIC
Bilirubin Urine: NEGATIVE
Glucose, UA: NEGATIVE
Hgb urine dipstick: NEGATIVE
Ketones, ur: NEGATIVE
Leukocytes,Ua: NEGATIVE
Nitrite: NEGATIVE
Protein, ur: NEGATIVE
Specific Gravity, Urine: 1.012 (ref 1.001–1.03)
pH: <=5 (ref 5.0–8.0)

## 2020-07-20 LAB — MICROALBUMIN / CREATININE URINE RATIO
Creatinine, Urine: 66 mg/dL (ref 20–275)
Microalb Creat Ratio: 8 mcg/mg creat (ref <30)
Microalb, Ur: 0.5 mg/dL

## 2020-07-22 ENCOUNTER — Other Ambulatory Visit: Payer: Self-pay

## 2020-07-22 ENCOUNTER — Encounter: Payer: Self-pay | Admitting: Family

## 2020-07-22 DIAGNOSIS — I10 Essential (primary) hypertension: Secondary | ICD-10-CM

## 2020-07-23 ENCOUNTER — Other Ambulatory Visit: Payer: Self-pay

## 2020-07-23 ENCOUNTER — Other Ambulatory Visit: Payer: Self-pay | Admitting: Family

## 2020-07-23 DIAGNOSIS — B373 Candidiasis of vulva and vagina: Secondary | ICD-10-CM

## 2020-07-23 DIAGNOSIS — B9689 Other specified bacterial agents as the cause of diseases classified elsewhere: Secondary | ICD-10-CM

## 2020-07-23 DIAGNOSIS — B3731 Acute candidiasis of vulva and vagina: Secondary | ICD-10-CM

## 2020-07-23 LAB — CERVICOVAGINAL ANCILLARY ONLY
Bacterial Vaginitis (gardnerella): POSITIVE — AB
Candida Glabrata: NEGATIVE
Candida Vaginitis: POSITIVE — AB
Comment: NEGATIVE
Comment: NEGATIVE
Comment: NEGATIVE

## 2020-07-23 MED ORDER — FLUCONAZOLE 150 MG PO TABS: 150.0000 mg | ORAL_TABLET | Freq: Once | ORAL | 1 refills | Status: DC

## 2020-07-23 MED ORDER — METRONIDAZOLE 500 MG PO TABS: 500.0000 mg | ORAL_TABLET | Freq: Two times a day (BID) | ORAL | 0 refills | Status: DC

## 2020-07-23 MED ORDER — LOSARTAN POTASSIUM 25 MG PO TABS: ORAL_TABLET | ORAL | 1 refills | Status: DC

## 2020-07-23 MED FILL — FLUCONAZOLE 150 MG TABS: 150 | 3 days supply | Qty: 2 | Fill #0

## 2020-07-23 MED FILL — LOSARTAN POTASSIUM 25 MG TA: 25 | 50 days supply | Qty: 75 | Fill #0

## 2020-07-23 MED FILL — METRONIDAZOLE 500 MG TABS: 500 | 7 days supply | Qty: 14 | Fill #0

## 2020-07-23 NOTE — Telephone Encounter (Signed)
I spoke with patient to clarify what she was taking daily. She was willing to try the Losartan 12.5mg  TID. I have sent new prescription to [facility]. BMP ordered & will have drawn at work in one week.

## 2020-07-26 MED FILL — JANUVIA 25 MG TABLET: 25 | 30 days supply | Qty: 30 | Fill #1

## 2020-07-30 ENCOUNTER — Encounter: Payer: Self-pay | Admitting: Family

## 2020-07-30 ENCOUNTER — Other Ambulatory Visit: Payer: Self-pay

## 2020-07-30 ENCOUNTER — Other Ambulatory Visit
Admission: RE | Admit: 2020-07-30 | Discharge: 2020-07-30 | Disposition: A | Discharge status: Home / Self Care | Payer: 59 | Attending: Family | Admitting: Family

## 2020-07-30 DIAGNOSIS — I10 Essential (primary) hypertension: Secondary | ICD-10-CM | POA: Diagnosis not present

## 2020-07-30 LAB — BASIC METABOLIC PANEL
Anion gap: 9 (ref 5–15)
BUN: 16 mg/dL (ref 6–20)
CO2: 28 mmol/L (ref 22–32)
Calcium: 9.4 mg/dL (ref 8.9–10.3)
Chloride: 103 mmol/L (ref 98–111)
Creatinine, Ser: 0.67 mg/dL (ref 0.44–1.00)
GFR, Estimated: >60 mL/min (ref >60)
Glucose, Bld: 110 mg/dL — ABNORMAL HIGH (ref 70–99)
Potassium: 3.8 mmol/L (ref 3.5–5.1)
Sodium: 140 mmol/L (ref 135–145)

## 2020-08-06 ENCOUNTER — Encounter: Payer: Self-pay | Admitting: Family

## 2020-08-06 ENCOUNTER — Other Ambulatory Visit: Payer: Self-pay

## 2020-08-21 ENCOUNTER — Other Ambulatory Visit: Payer: Self-pay | Admitting: Family

## 2020-08-21 MED FILL — JANUVIA 25 MG TABLET: 25 | 30 days supply | Qty: 30 | Fill #2

## 2020-08-21 MED FILL — ROSUVASTATIN CALCIUM 20 MG: 20 | 90 days supply | Qty: 90 | Fill #1

## 2020-08-21 MED FILL — METFORMIN HCL 500 MG TABS: 500 | 30 days supply | Qty: 60 | Fill #0

## 2020-08-21 MED FILL — FLUCONAZOLE 150 MG TABS: 150 | 3 days supply | Qty: 2 | Fill #1

## 2020-09-02 ENCOUNTER — Other Ambulatory Visit: Payer: Self-pay

## 2020-09-02 ENCOUNTER — Ambulatory Visit
Admission: EM | Admit: 2020-09-02 | Discharge: 2020-09-02 | Disposition: A | Discharge status: Home / Self Care | Payer: 59 | Attending: Family Medicine | Admitting: Family Medicine

## 2020-09-02 DIAGNOSIS — Z20822 Contact with and (suspected) exposure to covid-19: Secondary | ICD-10-CM | POA: Insufficient documentation

## 2020-09-02 NOTE — ED Triage Notes (Signed)
Nurse visit for COVID test for travel No sx.

## 2020-09-03 LAB — SARS CORONAVIRUS 2 (TAT 6-24 HRS): SARS Coronavirus 2: NEGATIVE

## 2020-09-13 ENCOUNTER — Other Ambulatory Visit: Payer: Self-pay | Admitting: Oncology

## 2020-09-13 MED ORDER — SULFAMETHOXAZOLE-TRIMETHOPRIM 800-160 MG PO TABS: 1.0000 | ORAL_TABLET | Freq: Two times a day (BID) | ORAL | 0 refills | Status: DC

## 2020-09-27 MED FILL — JANUVIA 25 MG TABLET: 25 | 30 days supply | Qty: 30 | Fill #3

## 2020-09-30 MED FILL — LOSARTAN POTASSIUM 25 MG TA: 25 | 30 days supply | Qty: 45 | Fill #1

## 2020-09-30 MED FILL — METFORMIN HCL 500 MG TABS: 500 | 30 days supply | Qty: 60 | Fill #1

## 2020-10-02 ENCOUNTER — Telehealth: Payer: Self-pay | Admitting: Family

## 2020-10-02 NOTE — Telephone Encounter (Signed)
Call pt Rec'd note that losartan 25mg  is on back order I would prefer that we NOT change medication as she has had many side effects and we have just gotten to wear she can take this medication  We can move prescription to a different pharmacy or does she have enough medication to wait on backorder?

## 2020-10-03 DIAGNOSIS — R809 Proteinuria, unspecified: Secondary | ICD-10-CM | POA: Diagnosis not present

## 2020-10-03 NOTE — Telephone Encounter (Signed)
Spoke with pt and she stated that she received her Losartan in the mail yesterday.

## 2020-10-04 ENCOUNTER — Encounter: Payer: Self-pay | Admitting: Family

## 2020-10-04 DIAGNOSIS — M25462 Effusion, left knee: Secondary | ICD-10-CM | POA: Diagnosis not present

## 2020-10-04 DIAGNOSIS — M25562 Pain in left knee: Secondary | ICD-10-CM | POA: Diagnosis not present

## 2020-10-07 DIAGNOSIS — E119 Type 2 diabetes mellitus without complications: Secondary | ICD-10-CM | POA: Diagnosis not present

## 2020-10-07 DIAGNOSIS — Z01419 Encounter for gynecological examination (general) (routine) without abnormal findings: Secondary | ICD-10-CM | POA: Diagnosis not present

## 2020-10-07 DIAGNOSIS — I10 Essential (primary) hypertension: Secondary | ICD-10-CM | POA: Diagnosis not present

## 2020-10-07 NOTE — Telephone Encounter (Signed)
Component    Creatinine, Ur  Urine Protein/Creatinine Ratio  Protein/Creatinine Ratio, Urine  Protein Urine Random  Albumin %  Alpha 1 Globulin, Ur   Component 10/03/2020 06/17/2020 01/31/2020 12/18/2019        Creatinine, Ur 76 50 31 37  Urine Protein/Creatinine Ratio 118 140 839High   973High    Protein/Creatinine Ratio, Urine 0.118 0.140 0.839High   0.973High    Protein Urine Random 9 7 26High   36High    Albumin % -- -- -- 41  Alpha 1 Globulin, Ur --      Labs done 10/03/20, Please advise

## 2020-10-08 ENCOUNTER — Other Ambulatory Visit: Payer: Self-pay | Admitting: Family Medicine

## 2020-10-08 DIAGNOSIS — M25561 Pain in right knee: Secondary | ICD-10-CM

## 2020-10-09 ENCOUNTER — Encounter: Payer: Self-pay | Admitting: Family

## 2020-11-06 ENCOUNTER — Other Ambulatory Visit (HOSPITAL_BASED_OUTPATIENT_CLINIC_OR_DEPARTMENT_OTHER): Payer: Self-pay

## 2020-11-12 ENCOUNTER — Other Ambulatory Visit: Payer: Self-pay | Admitting: Family

## 2020-11-12 MED FILL — LOSARTAN POTASSIUM 25 MG TA: 25 | 20 days supply | Qty: 30 | Fill #2

## 2020-11-14 ENCOUNTER — Other Ambulatory Visit
Admission: RE | Admit: 2020-11-14 | Discharge: 2020-11-14 | Disposition: A | Discharge status: Home / Self Care | Payer: 59 | Attending: Surgery | Admitting: Surgery

## 2020-11-14 ENCOUNTER — Other Ambulatory Visit: Payer: Self-pay

## 2020-11-14 DIAGNOSIS — E119 Type 2 diabetes mellitus without complications: Secondary | ICD-10-CM | POA: Insufficient documentation

## 2020-11-14 DIAGNOSIS — E785 Hyperlipidemia, unspecified: Secondary | ICD-10-CM | POA: Diagnosis not present

## 2020-11-14 LAB — COMPREHENSIVE METABOLIC PANEL
ALT: 26 U/L (ref 0–44)
AST: 28 U/L (ref 15–41)
Albumin: 4.6 g/dL (ref 3.5–5.0)
Alkaline Phosphatase: 57 U/L (ref 38–126)
Anion gap: 6 (ref 5–15)
BUN: 15 mg/dL (ref 6–20)
CO2: 26 mmol/L (ref 22–32)
Calcium: 9.3 mg/dL (ref 8.9–10.3)
Chloride: 106 mmol/L (ref 98–111)
Creatinine, Ser: 0.82 mg/dL (ref 0.44–1.00)
GFR, Estimated: >60 mL/min (ref >60)
Glucose, Bld: 96 mg/dL (ref 70–99)
Potassium: 3.6 mmol/L (ref 3.5–5.1)
Sodium: 138 mmol/L (ref 135–145)
Total Bilirubin: 0.6 mg/dL (ref 0.3–1.2)
Total Protein: 8 g/dL (ref 6.5–8.1)

## 2020-11-14 LAB — HEMOGLOBIN A1C
Hgb A1c MFr Bld: 6.2 % — ABNORMAL HIGH (ref 4.8–5.6)
Mean Plasma Glucose: 131.24 mg/dL

## 2020-11-14 LAB — LIPID PANEL
Cholesterol: 176 mg/dL (ref 0–200)
HDL: 76 mg/dL (ref >40)
LDL Cholesterol: 90 mg/dL (ref 0–99)
Total CHOL/HDL Ratio: 2.3 RATIO
Triglycerides: 49 mg/dL (ref <150)
VLDL: 10 mg/dL (ref 0–40)

## 2020-11-15 ENCOUNTER — Encounter: Payer: Self-pay | Admitting: Family

## 2020-11-16 ENCOUNTER — Other Ambulatory Visit (HOSPITAL_COMMUNITY): Payer: Self-pay

## 2020-11-16 MED FILL — Metformin HCl Tab 500 MG: ORAL | 30 days supply | Qty: 60 | Fill #0 | Status: AC

## 2020-11-17 ENCOUNTER — Other Ambulatory Visit (HOSPITAL_COMMUNITY): Payer: Self-pay

## 2020-11-18 ENCOUNTER — Telehealth: Payer: Self-pay | Admitting: Family

## 2020-11-18 ENCOUNTER — Encounter: Payer: Self-pay | Admitting: Family

## 2020-11-18 ENCOUNTER — Ambulatory Visit: Payer: 59 | Admitting: Family

## 2020-11-18 ENCOUNTER — Other Ambulatory Visit: Payer: Self-pay

## 2020-11-18 VITALS — BP 102/68 | HR 89 | Temp 98.5°F | Ht 66.0 in | Wt 159.0 lb

## 2020-11-18 DIAGNOSIS — E119 Type 2 diabetes mellitus without complications: Secondary | ICD-10-CM

## 2020-11-18 DIAGNOSIS — I10 Essential (primary) hypertension: Secondary | ICD-10-CM

## 2020-11-18 DIAGNOSIS — R208 Other disturbances of skin sensation: Secondary | ICD-10-CM

## 2020-11-18 DIAGNOSIS — R809 Proteinuria, unspecified: Secondary | ICD-10-CM

## 2020-11-18 DIAGNOSIS — E785 Hyperlipidemia, unspecified: Secondary | ICD-10-CM

## 2020-11-18 MED ORDER — METFORMIN HCL 500 MG PO TABS: 500.0000 mg | ORAL_TABLET | Freq: Every day | ORAL | 1 refills | Status: DC

## 2020-11-18 MED ORDER — CLOTRIMAZOLE 1 % EX CREA
1.0000 "application " | TOPICAL_CREAM | Freq: Two times a day (BID) | CUTANEOUS | 1 refills | Status: DC
  Filled 2020-11-18: qty 30, 15d supply, fill #0

## 2020-11-18 NOTE — Progress Notes (Signed)
Subjective:    Patient ID: LENNYX VERDELL, female    DOB: Jun 21, 1966, 55 y.o.   MRN: [id number]  CC: Alyssa Schultz is a 55 y.o. female who presents today for follow up.   HPI: Complains of 'burning' area of right groin to upper medial thigh x 3 weeks, unchanged. She notices it when wearing pants. Feels better with ice, heat, or if not wearing pants. Not a deep pain. No rash. No injury. No numbness, back pain. No new detergents. Tried bacitracin with some relief.  She has h/o candida intertrigo.  She also has had intermittent 'tingling' at the end of urination. She is concerned that she may have early signs of urinary infection.   No dysuria, nausea, vomiting, fever, abdominal pain.  Proteinuria - compliant Losartan 74m qd. If she takes an additional dose of losartan 12.537m blood pressure approaches 90/60.   DM- Compliant with metformin 5003md ,januvia 45m80m HLD- compliant with crestor.She would like to increase exercise prior to increasing dose.    HISTORY:  Past Medical History:  Diagnosis Date  . Complication of anesthesia    hard to wake in 2017, patient reports she is 30 lb lighter now  . Diabetes mellitus    dx 2014   just take pills  . Hyperlipidemia   . Hypertension   . [facility] ([facility] Past Surgical History:  Procedure Laterality Date  . abalsion    . ablation    . ANTERIOR CERVICAL DECOMP/DISCECTOMY FUSION N/A 09/19/2015   Procedure: ANTERIOR CERVICAL DECOMPRESSION/DISCECTOMY FUSION 2 LEVELS;  Surgeon: MarkPhylliss Bob;  [facility];  Laterality: N/A;  Anterior cervical decompression fusion, cervical 3-4, cervical 4-5 decompression with instrumentation and allograft  . BACK SURGERY    . FACIAL RECONSTRUCTION SURGERY    . TRANSFORAMINAL LUMBAR INTERBODY FUSION (TLIF) WITH PEDICLE SCREW FIXATION 2 LEVEL Right 07/26/2019   Procedure: RIGHT-SIDED LUMBAR 5 - SACRUM 1 TRANSFORAMINAL LUMBAR INTERBODY FUSION WITH INSTRUMENTATION  AND ALLOGRAFT;  Surgeon: DumoPhylliss Bob;  [facility] [place] [place];  Laterality: Right;   Family History  Problem Relation Age of Onset  . Diabetes Mother   . Diabetes Maternal Grandmother   . Hypertension Maternal Grandfather   . Breast cancer Neg Hx     Allergies: Influenza vaccine live, Influenza vaccines, Hydrocodone, Morphine and related, and Prednisone Current Outpatient Medications on File Prior to Visit  Medication Sig Dispense Refill  . aspirin EC 81 MG EC tablet Take 1 tablet (81 mg total) by mouth daily. 30 tablet 0  . Blood Glucose Monitoring Suppl (FREESTYLE FREEDOM LITE) w/Device KIT     . glucose blood (FREESTYLE LITE) test strip Used to check blood sugars once a day. 100 each 4  . losartan (COZAAR) 25 MG tablet TAKE 1/2 TABLET BY MOUTH 3 TIMES DAILY (Patient taking differently: Take 25 mg by mouth daily.) 75 tablet 1  . meclizine (ANTIVERT) 50 MG tablet Take 1 tablet (50 mg total) by mouth 3 (three) times daily as needed for dizziness. 30 tablet 1  . rosuvastatin (CRESTOR) 20 MG tablet TAKE 1 TABLET BY MOUTH DAILY 90 tablet 1  . TRUEPLUS LANCETS 30G MISC USE AS DIRECTED 100 each 5  . valACYclovir (VALTREX) 500 MG tablet Take 2 tablets (1,000 mg total) by mouth 2 (two) times daily. Take prn cold sore on on day 12 tablet 1   No current facility-administered medications on file prior to visit.  Social History   Tobacco Use  . Smoking status: Never Smoker  . Smokeless tobacco: Never Used  Vaping Use  . Vaping Use: Never used  Substance Use Topics  . Alcohol use: Yes    Comment: Occasional wine  . Drug use: No    Review of Systems  Constitutional: Negative for chills and fever.  Respiratory: Negative for cough.   Cardiovascular: Negative for chest pain and palpitations.  Gastrointestinal: Negative for nausea and vomiting.  Genitourinary: Positive for dysuria. Negative for flank pain and hematuria.  Musculoskeletal: Negative for arthralgias,  joint swelling and myalgias.  Skin: Negative for rash.  Neurological: Negative for numbness.      Objective:    BP 102/68   Pulse 89   Temp 98.5 F (36.9 C)   Ht 5' 6"  (1.676 m)   Wt 159 lb (72.1 kg)   LMP 07/24/2007 Comment: ablation in 2008-pt reports she went through menopause after this  SpO2 97%   BMI 25.66 kg/m  BP Readings from Last 3 Encounters:  11/18/20 102/68  09/02/20 124/83  07/19/20 102/64   Wt Readings from Last 3 Encounters:  11/18/20 159 lb (72.1 kg)  07/19/20 160 lb 6.4 oz (72.8 kg)  04/12/20 154 lb (69.9 kg)    Physical Exam Vitals reviewed.  Constitutional:      Appearance: She is well-developed.  Eyes:     Conjunctiva/sclera: Conjunctivae normal.  Cardiovascular:     Rate and Rhythm: Normal rate and regular rhythm.     Pulses: Normal pulses.     Heart sounds: Normal heart sounds.  Pulmonary:     Effort: Pulmonary effort is normal.     Breath sounds: Normal breath sounds. No wheezing, rhonchi or rales.  Skin:    General: Skin is warm and dry.          Comments: Right medial thigh where patient has felt burning; no rash, ecchymosis, edema or tenderness with palpation.   Neurological:     Mental Status: She is alert.  Psychiatric:        Speech: Speech normal.        Behavior: Behavior normal.        Thought Content: Thought content normal.        Assessment & Plan:   Problem List Items Addressed This Visit      Endocrine   Diabetes mellitus type 2, controlled ([place]) - Primary    Lab Results  Component Value Date   HGBA1C 6.2 (H) 11/14/2020   Excellent control. We agreed trial stop of januvia and if a1c elevated we would increase metformin. Continue metformin 581m qd.       Relevant Medications   metFORMIN (GLUCOPHAGE) 500 MG tablet   Other Relevant Orders   Microalbumin / creatinine urine ratio   Urinalysis, Routine w reflex microscopic   Urine Culture     Other   Hyperlipidemia    Recent elevation of LDL> 70. We agreed  to pursue aggressive lifestyle changes prior to increase crestor dose. Continue crestor 273m       Proteinuria    Stable. Continue losartan 2560md      Skin soreness    Benign exam. Due to location and history, question whether subtle presentation of candida intertrigo, trial of clotrimazole.       Relevant Medications   clotrimazole (LOTRIMIN) 1 % cream       I have discontinued LisCristopher Peruorain's Accu-Chek FastClix Lancets, sulfamethoxazole-trimethoprim, metroNIDAZOLE, fluconazole, and sitaGLIPtin. I have  also changed her metFORMIN. Additionally, I am having her start on clotrimazole. Lastly, I am having her maintain her meclizine, TRUEplus Lancets 30G, aspirin, valACYclovir, FreeStyle Freedom Lite, FREESTYLE LITE, losartan, and rosuvastatin.   Meds ordered this encounter  Medications  . clotrimazole (LOTRIMIN) 1 % cream    Sig: Apply 1 application topically 2 (two) times daily.    Dispense:  30 g    Refill:  1    Order Specific Question:   Supervising Provider    Answer:   Derrel Nip, TERESA L [[id number]]  . metFORMIN (GLUCOPHAGE) 500 MG tablet    Sig: Take 1 tablet (500 mg total) by mouth daily with breakfast.    Dispense:  90 tablet    Refill:  1    Order Specific Question:   Supervising Provider    Answer:   Crecencio Mc [[id number]]    Return precautions given.   Risks, benefits, and alternatives of the medications and treatment plan prescribed today were discussed, and patient expressed understanding.   Education regarding symptom management and diagnosis given to patient on AVS.  Continue to follow with Burnard Hawthorne, FNP for routine health maintenance.   Cephus Richer and I agreed with plan.   Mable Paris, FNP

## 2020-11-18 NOTE — Telephone Encounter (Signed)
Pt would like to have lab work done in 6 month sent to [facility] lab collect

## 2020-11-18 NOTE — Telephone Encounter (Signed)
Should I just order routine labs for patient? I can call & let her know that they can see the labs that we order. Or she can just remind [place] nect appointment.

## 2020-11-18 NOTE — Addendum Note (Signed)
Addended by: Burnard Hawthorne on: 11/18/2020 04:39 PM   Modules accepted: Orders

## 2020-11-18 NOTE — Patient Instructions (Signed)
Nice to see you!   

## 2020-11-18 NOTE — Telephone Encounter (Signed)
I have ordered, they expire 05/20/21 Urine microalbumin CMP Lipid a1c

## 2020-11-19 LAB — URINE CULTURE
MICRO NUMBER:: 11727507
Result:: NO GROWTH
SPECIMEN QUALITY:: ADEQUATE

## 2020-11-19 LAB — MICROALBUMIN / CREATININE URINE RATIO
Creatinine,U: 74.5 mg/dL
Microalb Creat Ratio: 2.3 mg/g (ref 0.0–30.0)
Microalb, Ur: 1.7 mg/dL (ref 0.0–1.9)

## 2020-11-19 LAB — URINALYSIS, ROUTINE W REFLEX MICROSCOPIC
Bilirubin Urine: NEGATIVE
Hgb urine dipstick: NEGATIVE
Ketones, ur: NEGATIVE
Leukocytes,Ua: NEGATIVE
Nitrite: NEGATIVE
RBC / HPF: NONE SEEN (ref 0–?)
Specific Gravity, Urine: 1.015 (ref 1.000–1.030)
Total Protein, Urine: NEGATIVE
Urine Glucose: NEGATIVE
Urobilinogen, UA: 0.2 (ref 0.0–1.0)
pH: 6 (ref 5.0–8.0)

## 2020-11-19 NOTE — Assessment & Plan Note (Signed)
Lab Results  Component Value Date   HGBA1C 6.2 (H) 11/14/2020   Excellent control. We agreed trial stop of januvia and if a1c elevated we would increase metformin. Continue metformin 500mg  qd.

## 2020-11-19 NOTE — Assessment & Plan Note (Signed)
Benign exam. Due to location and history, question whether subtle presentation of candida intertrigo, trial of clotrimazole.

## 2020-11-19 NOTE — Assessment & Plan Note (Signed)
Stable. Continue losartan 25mg  qd

## 2020-11-19 NOTE — Assessment & Plan Note (Signed)
Recent elevation of LDL> 70. We agreed to pursue aggressive lifestyle changes prior to increase crestor dose. Continue crestor 20mg .

## 2020-11-20 ENCOUNTER — Encounter: Payer: Self-pay | Admitting: Family

## 2020-11-21 DIAGNOSIS — D2262 Melanocytic nevi of left upper limb, including shoulder: Secondary | ICD-10-CM | POA: Diagnosis not present

## 2020-11-26 ENCOUNTER — Other Ambulatory Visit: Payer: Self-pay | Admitting: Family

## 2020-11-26 DIAGNOSIS — E785 Hyperlipidemia, unspecified: Secondary | ICD-10-CM

## 2020-11-27 ENCOUNTER — Other Ambulatory Visit (HOSPITAL_COMMUNITY): Payer: Self-pay

## 2020-11-27 MED ORDER — ROSUVASTATIN CALCIUM 20 MG PO TABS
ORAL_TABLET | Freq: Every day | ORAL | 1 refills | Status: DC
  Filled 2020-11-27: qty 90, 90d supply, fill #0
  Filled 2021-02-18: qty 90, 90d supply, fill #1

## 2020-11-28 ENCOUNTER — Other Ambulatory Visit (HOSPITAL_COMMUNITY): Payer: Self-pay

## 2020-12-16 IMAGING — MR MR HEAD W/O CM
11 series · 48 of 48 positions shown · non-contrast
Comparison: Head CT 09/04/2019

CLINICAL DATA: Transient ischemic attack. Right upper extremity
weakness.

EXAM:
MRI HEAD WITHOUT CONTRAST
TECHNIQUE: Multiplanar, multiecho pulse sequences of the brain and surrounding
structures were obtained without intravenous contrast.

[Series 5: ax dwi_tracew · axial · 3.0mm · 0.60mm/px · z∈[-64,+91]mm · 4 of 48 slices shown]
[im 1/48]
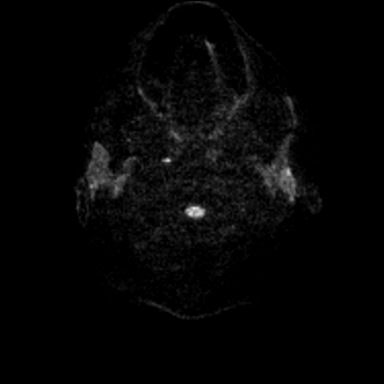
[im 16/48]
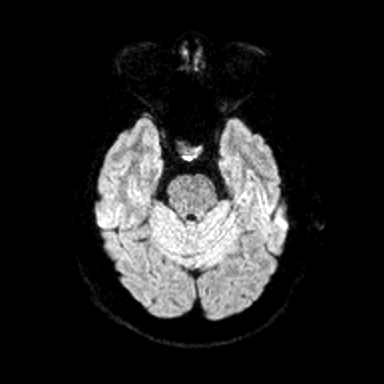
[im 32/48]
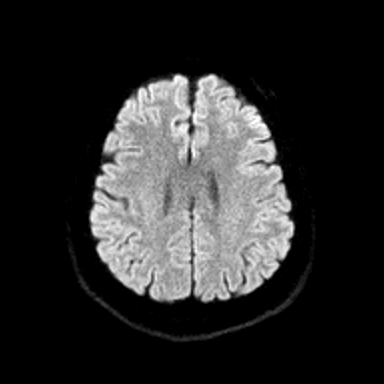
[im 48/48]
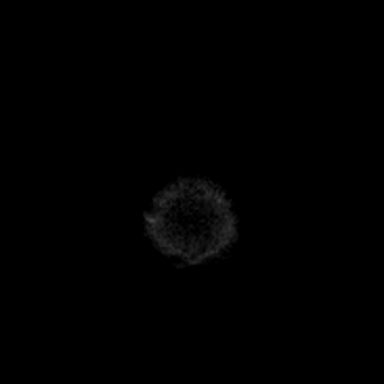

[Series 6: ax dwi_adc · axial · 3.0mm · 0.60mm/px · z∈[-64,+91]mm · 4 of 48 slices shown]
[im 1/48]
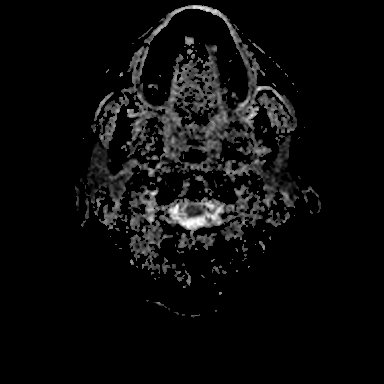
[im 16/48]
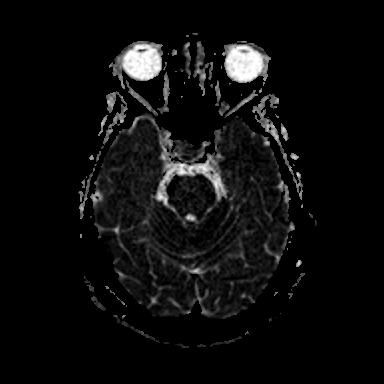
[im 32/48]
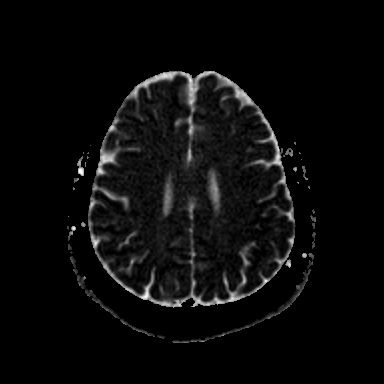
[im 48/48]
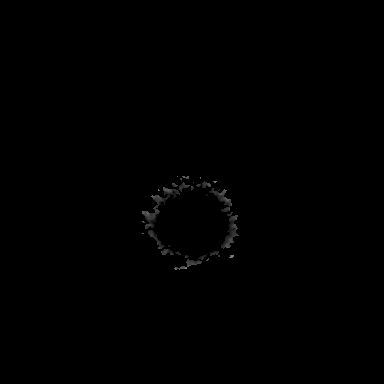

[Series 7: cor dwi_tracew · coronal · 5.0mm · 1.31mm/px · 3 of 38 slices shown]
[im 1/38]
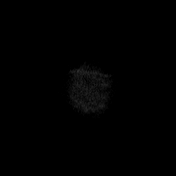
[im 19/38]
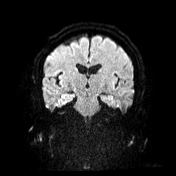
[im 38/38]
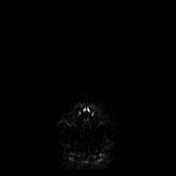

[Series 8: cor dwi_adc · coronal · 5.0mm · 1.31mm/px · 3 of 38 slices shown]
[im 1/38]
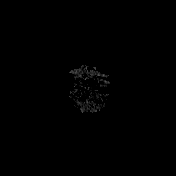
[im 19/38]
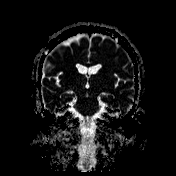
[im 38/38]
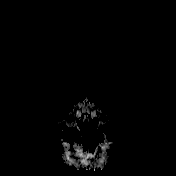

[Series 9: T1 · sagittal · 5.0mm · 0.62mm/px · 2 of 25 slices shown (1 of 2)]
[im 1/25]
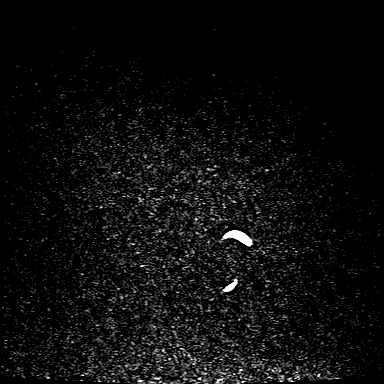
[im 25/25]
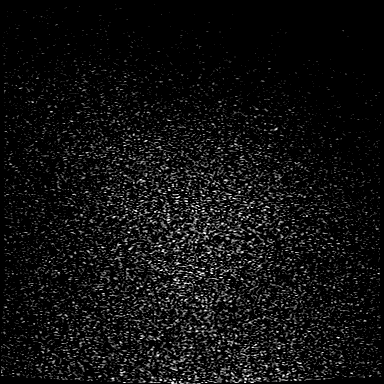

[Series 10: FLAIR · axial · 3.0mm · 0.53mm/px · z∈[-69,+93]mm · 4 of 55 slices shown]
[im 1/55]
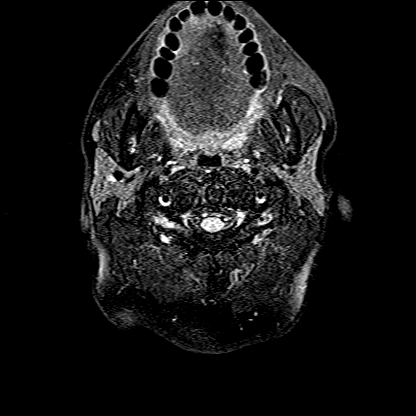
[im 19/55]
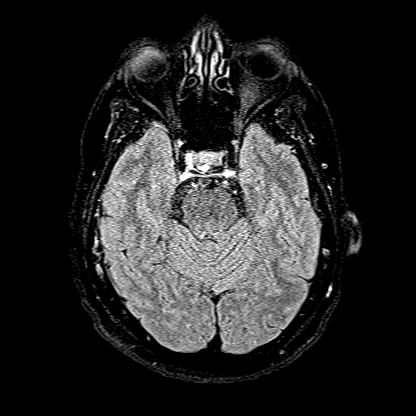
[im 37/55]
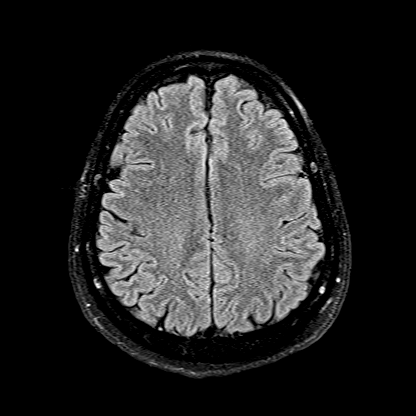
[im 55/55]
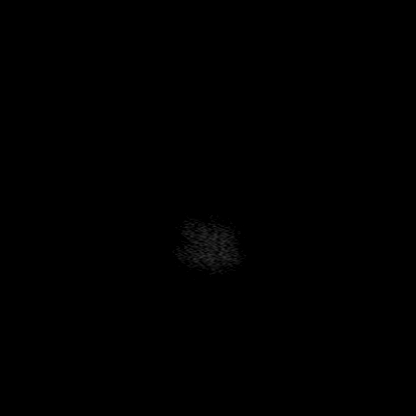

[Series 11: T2 · axial · 5.0mm · 0.53mm/px · z∈[-60,+84]mm · 2 of 25 slices shown (1 of 2)]
[im 1/25]
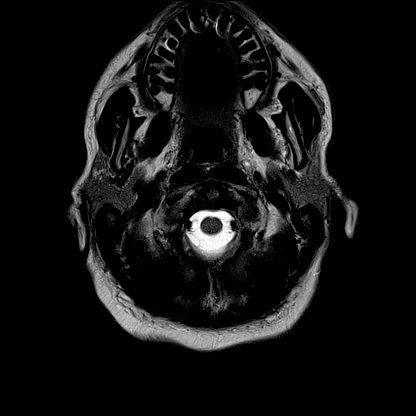
[im 25/25]
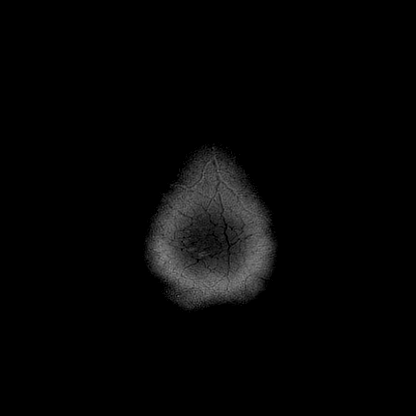

[Series 13: pha_images · axial · 3.0mm · 0.90mm/px · z∈[-76,+101]mm · 5 of 60 slices shown]
[im 1/60]
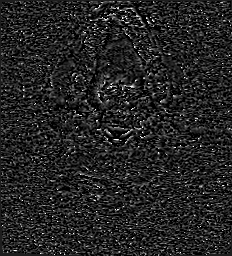
[im 15/60]
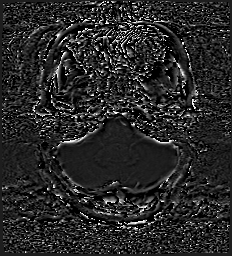
[im 30/60]
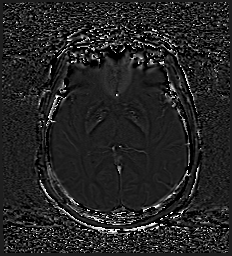
[im 45/60]
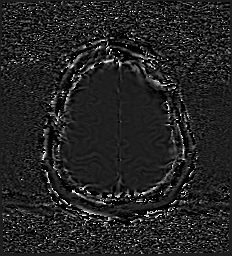
[im 60/60]
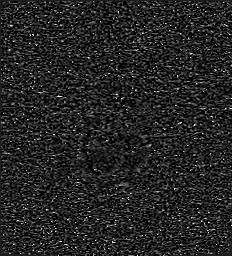

[Series 14: swi_images · axial · 3.0mm · 0.90mm/px · z∈[-76,+101]mm · 5 of 60 slices shown]
[im 1/60]
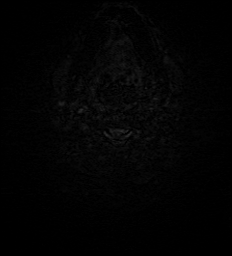
[im 15/60]
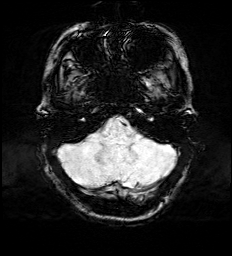
[im 30/60]
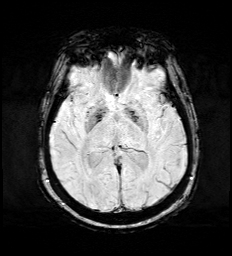
[im 45/60]
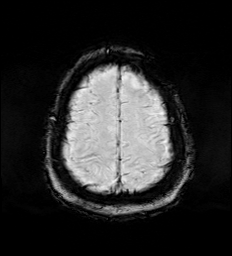
[im 60/60]
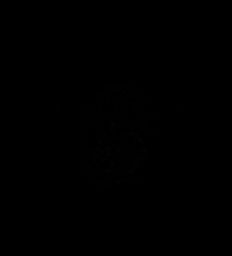

[Series 16: T1 · axial · 1.0mm · 0.98mm/px · z∈[-71,+104]mm · 14 of 176 slices shown (2 of 2)]
[im 1/176]
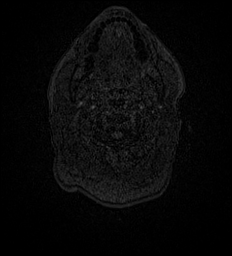
[im 14/176]
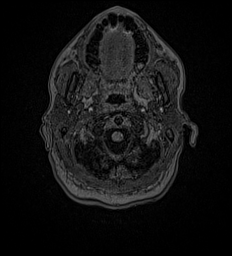
[im 27/176]
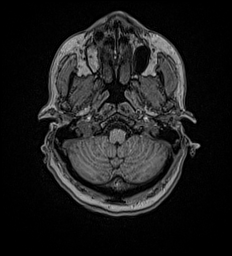
[im 41/176]
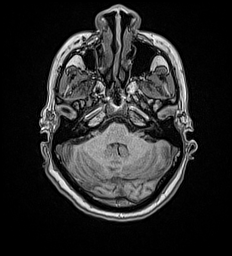
[im 54/176]
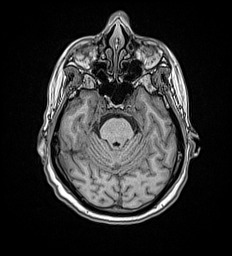
[im 68/176]
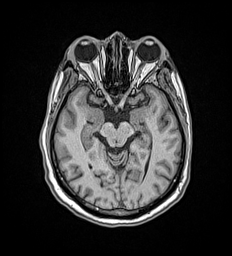
[im 81/176]
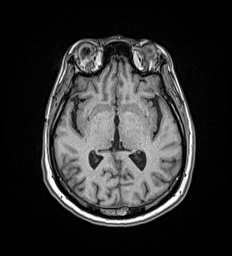
[im 95/176]
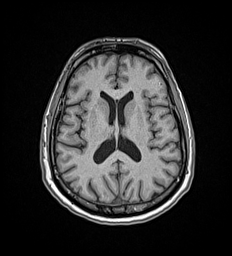
[im 108/176]
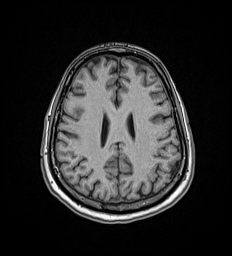
[im 122/176]
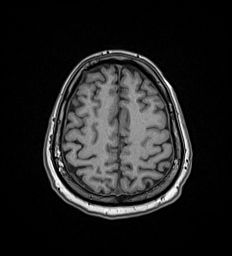
[im 135/176]
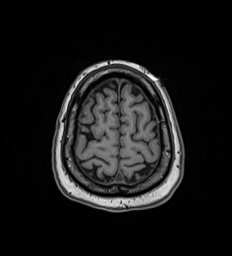
[im 149/176]
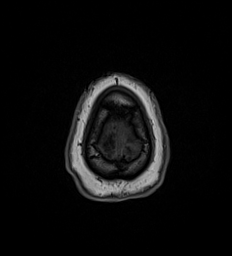
[im 162/176]
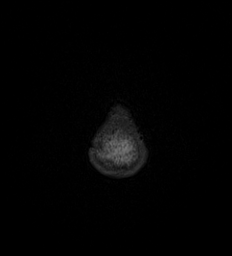
[im 176/176]
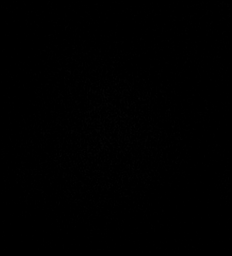

[Series 17: T2 · coronal · 5.0mm · 0.57mm/px · 2 of 29 slices shown (2 of 2)]
[im 1/29]
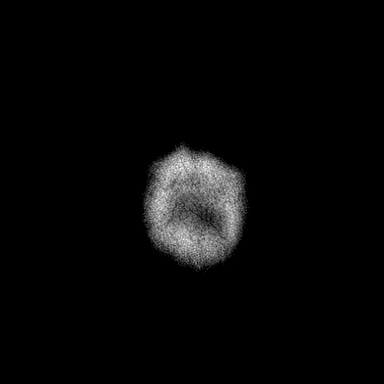
[im 29/29]
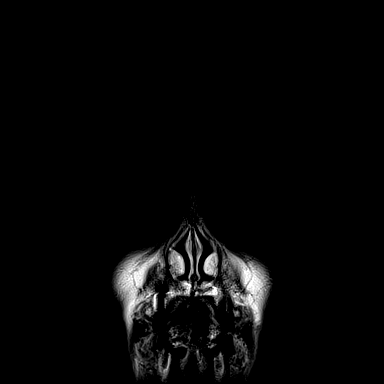

[48 of 48 positions shown; findings below may reference images not displayed]

FINDINGS: BRAIN: There is no acute infarct, acute hemorrhage or extra-axial
collection. The white matter signal is normal for the patient's age.
The cerebral and cerebellar volume are age-appropriate. There is no
hydrocephalus. The midline structures are normal.

VASCULAR: The major intracranial arterial and venous sinus flow
voids are normal. Susceptibility-sensitive sequences show no chronic
microhemorrhage or superficial siderosis.

SKULL AND UPPER CERVICAL SPINE: Calvarial bone marrow signal is
normal. There is no skull base mass. The visualized upper cervical
spine and soft tissues are normal.

SINUSES/ORBITS: There are no fluid levels or advanced mucosal
thickening. The mastoid air cells and middle ear cavities are free
of fluid. The orbits are normal.
IMPRESSION: Normal MRI of the brain.

## 2020-12-17 ENCOUNTER — Other Ambulatory Visit (HOSPITAL_COMMUNITY): Payer: Self-pay

## 2020-12-17 MED ORDER — JANUVIA 25 MG PO TABS
25.0000 mg | ORAL_TABLET | Freq: Every day | ORAL | 0 refills | Status: DC
  Filled 2020-12-17: qty 30, 30d supply, fill #0

## 2020-12-18 ENCOUNTER — Other Ambulatory Visit (HOSPITAL_COMMUNITY): Payer: Self-pay

## 2020-12-21 DIAGNOSIS — M65331 Trigger finger, right middle finger: Secondary | ICD-10-CM | POA: Diagnosis not present

## 2020-12-21 DIAGNOSIS — M6530 Trigger finger, unspecified finger: Secondary | ICD-10-CM | POA: Diagnosis not present

## 2020-12-21 DIAGNOSIS — M67841 Other specified disorders of synovium, right hand: Secondary | ICD-10-CM | POA: Diagnosis not present

## 2020-12-24 DIAGNOSIS — E1129 Type 2 diabetes mellitus with other diabetic kidney complication: Secondary | ICD-10-CM | POA: Diagnosis not present

## 2020-12-24 DIAGNOSIS — E7800 Pure hypercholesterolemia, unspecified: Secondary | ICD-10-CM | POA: Diagnosis not present

## 2020-12-24 DIAGNOSIS — R809 Proteinuria, unspecified: Secondary | ICD-10-CM | POA: Diagnosis not present

## 2020-12-27 ENCOUNTER — Other Ambulatory Visit (HOSPITAL_COMMUNITY): Payer: Self-pay

## 2020-12-27 ENCOUNTER — Other Ambulatory Visit: Payer: Self-pay | Admitting: Family

## 2020-12-27 MED ORDER — LOSARTAN POTASSIUM 25 MG PO TABS
ORAL_TABLET | ORAL | 1 refills | Status: DC
  Filled 2020-12-27: qty 75, 50d supply, fill #0
  Filled 2021-02-18: qty 75, 50d supply, fill #1

## 2021-01-07 DIAGNOSIS — M65331 Trigger finger, right middle finger: Secondary | ICD-10-CM | POA: Diagnosis not present

## 2021-01-09 ENCOUNTER — Other Ambulatory Visit: Payer: Self-pay | Admitting: Family

## 2021-01-09 ENCOUNTER — Other Ambulatory Visit (HOSPITAL_COMMUNITY): Payer: Self-pay

## 2021-01-09 MED ORDER — FREESTYLE LITE TEST VI STRP
ORAL_STRIP | 4 refills | Status: DC
  Filled 2021-01-09: qty 100, 90d supply, fill #0
  Filled 2021-05-07: qty 100, 90d supply, fill #1
  Filled 2021-09-03: qty 100, 90d supply, fill #2
  Filled 2021-12-31: qty 100, 90d supply, fill #0

## 2021-01-15 ENCOUNTER — Other Ambulatory Visit (HOSPITAL_COMMUNITY): Payer: Self-pay

## 2021-01-16 ENCOUNTER — Other Ambulatory Visit: Payer: Self-pay

## 2021-01-30 ENCOUNTER — Other Ambulatory Visit: Payer: Self-pay | Admitting: Family

## 2021-01-30 DIAGNOSIS — Z1231 Encounter for screening mammogram for malignant neoplasm of breast: Secondary | ICD-10-CM

## 2021-02-10 DIAGNOSIS — H524 Presbyopia: Secondary | ICD-10-CM | POA: Diagnosis not present

## 2021-02-10 DIAGNOSIS — E119 Type 2 diabetes mellitus without complications: Secondary | ICD-10-CM | POA: Diagnosis not present

## 2021-02-10 DIAGNOSIS — H5213 Myopia, bilateral: Secondary | ICD-10-CM | POA: Diagnosis not present

## 2021-02-10 DIAGNOSIS — H52222 Regular astigmatism, left eye: Secondary | ICD-10-CM | POA: Diagnosis not present

## 2021-02-10 LAB — HM DIABETES EYE EXAM

## 2021-02-11 ENCOUNTER — Ambulatory Visit
Admission: EM | Admit: 2021-02-11 | Discharge: 2021-02-11 | Disposition: A | Discharge status: Home / Self Care | Payer: 59 | Attending: Sports Medicine | Admitting: Sports Medicine

## 2021-02-11 ENCOUNTER — Other Ambulatory Visit: Payer: Self-pay

## 2021-02-11 ENCOUNTER — Encounter: Payer: Self-pay | Admitting: Family

## 2021-02-11 DIAGNOSIS — R300 Dysuria: Secondary | ICD-10-CM | POA: Insufficient documentation

## 2021-02-11 DIAGNOSIS — H6983 Other specified disorders of Eustachian tube, bilateral: Secondary | ICD-10-CM | POA: Diagnosis not present

## 2021-02-11 LAB — URINALYSIS, COMPLETE (UACMP) WITH MICROSCOPIC
Bilirubin Urine: NEGATIVE
Glucose, UA: NEGATIVE mg/dL
Ketones, ur: NEGATIVE mg/dL
Leukocytes,Ua: NEGATIVE
Nitrite: NEGATIVE
Protein, ur: NEGATIVE mg/dL
Specific Gravity, Urine: <1.005 — ABNORMAL LOW (ref 1.005–1.030)
pH: 5 (ref 5.0–8.0)

## 2021-02-11 MED ORDER — FLUTICASONE PROPIONATE 50 MCG/ACT NA SUSP: 2.0000 | Freq: Every day | NASAL | 1 refills | Status: DC

## 2021-02-11 NOTE — ED Triage Notes (Signed)
Pt reports 3 days of bilateral otalgia which feels like pressure. Some dizziness. Also wants to have urine tested due to mild dysuria.

## 2021-02-11 NOTE — Discharge Instructions (Addendum)
To push theUse the Flonase at bedtime, 2 squirts in each nostril.  Follow each set of squirts with 1 squirt of nasal saline particles up into your turbinates where they will take effect.  Start taking Allegra 180 mg daily to help dry up the fluid and eustachian tubes.  Return for reevaluation for new or worsening symptoms.

## 2021-02-11 NOTE — ED Provider Notes (Signed)
[facility]    CSN: [id number] Arrival date & time: 02/11/21  1145      History   Chief Complaint Chief Complaint  Patient presents with   Otalgia   Dysuria    HPI Alyssa Schultz is a 55 y.o. female.   HPI  55 year old female here for evaluation of bilateral ear pressure, mild dizziness, and painful urination.  Patient reports that she has had bilateral ear pressure with mild intermittent dizziness for the past 3 days and painful urination for the past 2 days.  She denies runny nose or nasal congestion, any recent swimming, fever, blood in urine, urinary urgency or frequency, or low back pain.  Patient is unsure if she had any changes to her hearing because she has tinnitus but she has not noticed a change in that.  Past Medical History:  Diagnosis Date   Complication of anesthesia    hard to wake in 2017, patient reports she is 30 lb lighter now   Diabetes mellitus    dx 2014   just take pills   Hyperlipidemia    Hypertension    Neuromuscular disorder [facility])     Patient Active Problem List   Diagnosis Date Noted   Skin soreness 11/18/2020   Cervical adenopathy 04/12/2020   Dysuria 04/12/2020   Vaginal itching 04/12/2020   Acute vaginitis 04/12/2020   Vaccine counseling 03/27/2020   Abnormal SPEP 01/19/2020   Proteinuria 12/20/2019   Angina at rest [facility]) 10/11/2019   Ankle edema 10/11/2019   TIA (transient ischemic attack) 09/05/2019   Radiculopathy 07/26/2019   Leg pain 02/25/2018   Edema 02/25/2018   Hyperlipidemia 12/10/2017   Screening for breast cancer 06/03/2017   Screen for colon cancer 06/03/2017   Bronchitis 12/03/2016   Left hip pain 06/04/2016   Routine general medical examination at a health care facility 03/04/2016   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 ([place]) 09/19/2015   Allergic rhinitis 10/14/2012   Hypertension 11/09/2011   Rash 10/01/2011   Diabetes mellitus type 2, controlled ([place]) 05/21/2011   BMI  30.0-30.9,adult 05/21/2011    Past Surgical History:  Procedure Laterality Date   abalsion     ablation     ANTERIOR CERVICAL DECOMP/DISCECTOMY FUSION N/A 09/19/2015   Procedure: ANTERIOR CERVICAL DECOMPRESSION/DISCECTOMY FUSION 2 LEVELS;  Surgeon: Phylliss Bob, MD;  Location: [place];  Service: Orthopedics;  Laterality: N/A;  Anterior cervical decompression fusion, cervical 3-4, cervical 4-5 decompression with instrumentation and allograft   BACK SURGERY     FACIAL RECONSTRUCTION SURGERY     TRANSFORAMINAL LUMBAR INTERBODY FUSION (TLIF) WITH PEDICLE SCREW FIXATION 2 LEVEL Right 07/26/2019   Procedure: RIGHT-SIDED LUMBAR 5 - SACRUM 1 TRANSFORAMINAL LUMBAR INTERBODY FUSION WITH INSTRUMENTATION AND ALLOGRAFT;  Surgeon: Phylliss Bob, MD;  Location: [place];  Service: Orthopedics;  Laterality: Right;    OB History   No obstetric history on file.      Home Medications    Prior to Admission medications   Medication Sig Start Date End Date Taking? Authorizing Provider  fluticasone (FLONASE) 50 MCG/ACT nasal spray Place 2 sprays into both nostrils daily. 02/11/21  Yes Margarette Canada, NP  aspirin EC 81 MG EC tablet Take 1 tablet (81 mg total) by mouth daily. 09/06/19   Thornell Mule, MD  Blood Glucose Monitoring Suppl (FREESTYLE FREEDOM LITE) w/Device KIT  10/17/19   [provider]  glucose blood (FREESTYLE LITE) test strip Used to check blood sugars once a day. 01/09/21  Burnard Hawthorne, FNP  losartan (COZAAR) 25 MG tablet TAKE 1/2 TABLET BY MOUTH 3 TIMES DAILY 12/27/20 12/27/21  Burnard Hawthorne, FNP  meclizine (ANTIVERT) 50 MG tablet Take 1 tablet (50 mg total) by mouth 3 (three) times daily as needed for dizziness. 01/06/15   Lorin Picket, PA-C  metFORMIN (GLUCOPHAGE) 500 MG tablet Take 1 tablet (500 mg total) by mouth daily with breakfast. 11/18/20 11/18/21  Burnard Hawthorne, FNP  rosuvastatin (CRESTOR) 20 MG tablet TAKE 1 TABLET BY MOUTH DAILY 11/27/20 11/27/21  Burnard Hawthorne,  FNP  TRUEPLUS LANCETS 30G MISC USE AS DIRECTED 03/06/16   Jackolyn Confer, MD  valACYclovir (VALTREX) 500 MG tablet Take 2 tablets (1,000 mg total) by mouth 2 (two) times daily. Take prn cold sore on on day 10/09/19   Juline Patch, MD    Family History Family History  Problem Relation Age of Onset   Diabetes Mother    Diabetes Maternal Grandmother    Hypertension Maternal Grandfather    Breast cancer Neg Hx     Social History Social History   Tobacco Use   Smoking status: Never   Smokeless tobacco: Never  Vaping Use   Vaping Use: Never used  Substance Use Topics   Alcohol use: Yes    Comment: Occasional wine   Drug use: No     Allergies   Influenza vaccine live, Influenza vaccines, Hydrocodone, Morphine and related, and Prednisone   Review of Systems Review of Systems  Constitutional:  Negative for activity change, appetite change and fever.  HENT:  Positive for ear pain and tinnitus. Negative for congestion, ear discharge, hearing loss and rhinorrhea.   Genitourinary:  Positive for dysuria. Negative for frequency, hematuria and urgency.  Musculoskeletal:  Negative for back pain.    Physical Exam Triage Vital Signs ED Triage Vitals  Enc Vitals Group     BP 02/11/21 1156 124/74     Pulse Rate 02/11/21 1156 67     Resp 02/11/21 1156 16     Temp 02/11/21 1156 98 F (36.7 C)     Temp Source 02/11/21 1156 Oral     SpO2 02/11/21 1156 100 %     Weight 02/11/21 1157 157 lb (71.2 kg)     Height 02/11/21 1157 5' 5.5" (1.664 m)     Head Circumference --      Peak Flow --      Pain Score 02/11/21 1156 4     Pain Loc --      Pain Edu? --      Excl. in [place]? --    No data found.  Updated Vital Signs BP 124/74 (BP Location: Left Arm)   Pulse 67   Temp 98 F (36.7 C) (Oral)   Resp 16   Ht 5' 5.5" (1.664 m)   Wt 157 lb (71.2 kg)   LMP 07/24/2007 Comment: ablation in 2008-pt reports she went through menopause after this  SpO2 100%   BMI 25.73 kg/m   Visual  Acuity Right Eye Distance:   Left Eye Distance:   Bilateral Distance:    Right Eye Near:   Left Eye Near:    Bilateral Near:     Physical Exam Vitals and nursing note reviewed.  Constitutional:      General: She is not in acute distress.    Appearance: Normal appearance. She is not ill-appearing.  HENT:     Head: Normocephalic and atraumatic.     Right Ear:  Tympanic membrane, ear canal and external ear normal. There is no impacted cerumen.     Left Ear: Tympanic membrane, ear canal and external ear normal. There is no impacted cerumen.  Cardiovascular:     Rate and Rhythm: Normal rate and regular rhythm.     Pulses: Normal pulses.     Heart sounds: Normal heart sounds. No murmur heard.   No gallop.  Pulmonary:     Effort: Pulmonary effort is normal.     Breath sounds: Normal breath sounds. No wheezing, rhonchi or rales.  Abdominal:     Tenderness: There is no right CVA tenderness or left CVA tenderness.  Skin:    General: Skin is warm and dry.     Capillary Refill: Capillary refill takes less than 2 seconds.  Neurological:     General: No focal deficit present.     Mental Status: She is alert and oriented to person, place, and time.  Psychiatric:        Mood and Affect: Mood normal.        Behavior: Behavior normal.        Thought Content: Thought content normal.        Judgment: Judgment normal.     UC Treatments / Results  Labs (all labs ordered are listed, but only abnormal results are displayed) Labs Reviewed  URINALYSIS, COMPLETE (UACMP) WITH MICROSCOPIC - Abnormal; Notable for the following components:      Result Value   Specific Gravity, Urine <1.005 (*)    Hgb urine dipstick TRACE (*)    Bacteria, UA RARE (*)    All other components within normal limits    EKG   Radiology No results found.  Procedures Procedures (including critical care time)  Medications Ordered in UC Medications - No data to display  Initial Impression / Assessment and Plan  / UC Course  I have reviewed the triage vital signs and the nursing notes.  Pertinent labs & imaging results that were available during my care of the patient were reviewed by me and considered in my medical decision making (see chart for details).  Patient is a very pleasant, nontoxic-appearing 55 year old female here for evaluation of ear and urinary complaints as outlined in HPI above.  Patient's physical exam reveals pearly gray tympanic membranes bilaterally with a normal light reflex and clear external auditory canals.  Patient does have mild tenderness to palpation of her eustachian tubes bilaterally.  Cardiopulmonary exam is benign.  No CVA tenderness on exam.  Urine collected at triage.  Patient states she is unaware if she has any urgency or frequency as she drinks a large amount of water each day and voids frequently anyhow.  Urinalysis shows trace hemoglobin and rare bacteria but is otherwise unremarkable.  No pyuria or signs of infection.  Will discharge patient home with a diagnosis of eustachian tube dysfunction and have her start using Allegra 180 mg daily and Flonase nasal spray 2 squirts in each nostril at bedtime.   Final Clinical Impressions(s) / UC Diagnoses   Final diagnoses:  Eustachian tube dysfunction, bilateral  Dysuria     Discharge Instructions      To push theUse the Flonase at bedtime, 2 squirts in each nostril.  Follow each set of squirts with 1 squirt of nasal saline particles up into your turbinates where they will take effect.  Start taking Allegra 180 mg daily to help dry up the fluid and eustachian tubes.  Return for reevaluation for new or worsening  symptoms.     ED Prescriptions     Medication Sig Dispense Auth. Provider   fluticasone (FLONASE) 50 MCG/ACT nasal spray Place 2 sprays into both nostrils daily. 18.2 mL Margarette Canada, NP      PDMP not reviewed this encounter.   Margarette Canada, NP 02/11/21 1314

## 2021-02-12 ENCOUNTER — Ambulatory Visit
Admission: RE | Admit: 2021-02-12 | Discharge: 2021-02-12 | Disposition: A | Discharge status: Home / Self Care | Payer: 59 | Source: Ambulatory Visit | Attending: Family | Admitting: Family

## 2021-02-12 DIAGNOSIS — Z1231 Encounter for screening mammogram for malignant neoplasm of breast: Secondary | ICD-10-CM | POA: Insufficient documentation

## 2021-02-13 ENCOUNTER — Ambulatory Visit
Admission: EM | Admit: 2021-02-13 | Discharge: 2021-02-13 | Disposition: A | Discharge status: Home / Self Care | Payer: 59 | Attending: Sports Medicine | Admitting: Sports Medicine

## 2021-02-13 ENCOUNTER — Other Ambulatory Visit: Payer: Self-pay

## 2021-02-13 DIAGNOSIS — B349 Viral infection, unspecified: Secondary | ICD-10-CM | POA: Diagnosis not present

## 2021-02-13 DIAGNOSIS — R509 Fever, unspecified: Secondary | ICD-10-CM | POA: Diagnosis not present

## 2021-02-13 DIAGNOSIS — Z888 Allergy status to other drugs, medicaments and biological substances status: Secondary | ICD-10-CM | POA: Insufficient documentation

## 2021-02-13 DIAGNOSIS — R519 Headache, unspecified: Secondary | ICD-10-CM | POA: Diagnosis not present

## 2021-02-13 DIAGNOSIS — Z7984 Long term (current) use of oral hypoglycemic drugs: Secondary | ICD-10-CM | POA: Diagnosis not present

## 2021-02-13 DIAGNOSIS — Z79899 Other long term (current) drug therapy: Secondary | ICD-10-CM | POA: Insufficient documentation

## 2021-02-13 DIAGNOSIS — J069 Acute upper respiratory infection, unspecified: Secondary | ICD-10-CM | POA: Diagnosis not present

## 2021-02-13 DIAGNOSIS — Z885 Allergy status to narcotic agent status: Secondary | ICD-10-CM | POA: Insufficient documentation

## 2021-02-13 DIAGNOSIS — R059 Cough, unspecified: Secondary | ICD-10-CM | POA: Diagnosis not present

## 2021-02-13 DIAGNOSIS — R0981 Nasal congestion: Secondary | ICD-10-CM | POA: Diagnosis not present

## 2021-02-13 DIAGNOSIS — U071 COVID-19: Secondary | ICD-10-CM | POA: Insufficient documentation

## 2021-02-13 DIAGNOSIS — Z887 Allergy status to serum and vaccine status: Secondary | ICD-10-CM | POA: Insufficient documentation

## 2021-02-13 DIAGNOSIS — Z7982 Long term (current) use of aspirin: Secondary | ICD-10-CM | POA: Diagnosis not present

## 2021-02-13 LAB — RESP PANEL BY RT-PCR (FLU A&B, COVID) ARPGX2
Influenza A by PCR: NEGATIVE
Influenza B by PCR: NEGATIVE
SARS Coronavirus 2 by RT PCR: POSITIVE — AB

## 2021-02-13 MED ORDER — MOLNUPIRAVIR EUA 200MG CAPSULE: 4.0000 | ORAL_CAPSULE | Freq: Two times a day (BID) | ORAL | 0 refills | Status: AC

## 2021-02-13 MED ORDER — PSEUDOEPH-BROMPHEN-DM 30-2-10 MG/5ML PO SYRP: 5.0000 mL | ORAL_SOLUTION | Freq: Four times a day (QID) | ORAL | 0 refills | Status: DC | PRN

## 2021-02-13 NOTE — Telephone Encounter (Signed)
Pt called again. Please return pt's call/ Pt wants a call from someone in our office. Unable to reach anyone at the time of call.

## 2021-02-13 NOTE — Discharge Instructions (Addendum)
As we discussed, your respiratory panel was negative for flu but positive for COVID. I have provided you medication for your cough. You can continue to use the Flonase and the Allegra. Please see educational handouts. You will need to quarantine for least 5 days and can come out of quarantine if you are asymptomatic and afebrile on day 6. I provided you a work note to that effect. Tylenol or Motrin for any fever or discomfort. Given your medical conditions I am going to go ahead and prescribe one of the newer COVID antiviral medications.

## 2021-02-13 NOTE — Telephone Encounter (Signed)
PT called into the office in regards to the ongoing issue with Fluid behind her ears. The PT would like to get prescribed a antibiotic as the medicine that she was prescribed already to treat it has caused her blood sugar to rise. PT was wanting to also be seen but nothing is available.

## 2021-02-13 NOTE — ED Provider Notes (Signed)
[facility]    CSN: [id number] Arrival date & time: 02/13/21  1350      History   Chief Complaint Chief Complaint  Patient presents with   Fever    HPI Alyssa Schultz is a 55 y.o. female.   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  She was actually seen here 2 days ago and diagnosed with eustachian tube dysfunction.  Was given Allegra as well as Flonase.  She says that the Flonase was actually making her cough.  She reports that her ears are bothering her and feels a lot of pressure.  She wonders if they are infected.  She started develop a fever today with a headache and chills.  When she checked her temperature and it it was greater than 100.5 she came over to the urgent care and left work.  She also notes some nasal congestion and some chest congestion with the cough.  She gets her primary care needs met over at  [facility] in [place].  She denies any chest pain or shortness of breath.   Past Medical History:  Diagnosis Date   Complication of anesthesia    hard to wake in 2017, patient reports she is 30 lb lighter now   Diabetes mellitus    dx 2014   just take pills   Hyperlipidemia    Hypertension    Neuromuscular disorder [facility])     Patient Active Problem List   Diagnosis Date Noted   Skin soreness 11/18/2020   Cervical adenopathy 04/12/2020   Dysuria 04/12/2020   Vaginal itching 04/12/2020   Acute vaginitis 04/12/2020   Vaccine counseling 03/27/2020   Abnormal SPEP 01/19/2020   Proteinuria 12/20/2019   Angina at rest [facility]) 10/11/2019   Ankle edema 10/11/2019   TIA (transient ischemic attack) 09/05/2019   Radiculopathy 07/26/2019   Leg pain 02/25/2018   Edema 02/25/2018   Hyperlipidemia 12/10/2017   Screening for breast cancer 06/03/2017   Screen for colon cancer 06/03/2017   Bronchitis 12/03/2016   Left hip pain 06/04/2016   Routine  general medical examination at a health care facility 03/04/2016   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 ([place]) 09/19/2015   Allergic rhinitis 10/14/2012   Hypertension 11/09/2011   Rash 10/01/2011   Diabetes mellitus type 2, controlled ([place]) 05/21/2011   BMI 30.0-30.9,adult 05/21/2011    Past Surgical History:  Procedure Laterality Date   abalsion     ablation     ANTERIOR CERVICAL DECOMP/DISCECTOMY FUSION N/A 09/19/2015   Procedure: ANTERIOR CERVICAL DECOMPRESSION/DISCECTOMY FUSION 2 LEVELS;  Surgeon: Phylliss Bob, MD;  Location: [place];  Service: Orthopedics;  Laterality: N/A;  Anterior cervical decompression fusion, cervical 3-4, cervical 4-5 decompression with instrumentation and allograft   BACK SURGERY     FACIAL RECONSTRUCTION SURGERY     TRANSFORAMINAL LUMBAR INTERBODY FUSION (TLIF) WITH PEDICLE SCREW FIXATION 2 LEVEL Right 07/26/2019   Procedure: RIGHT-SIDED LUMBAR 5 - SACRUM 1 TRANSFORAMINAL LUMBAR INTERBODY FUSION WITH INSTRUMENTATION AND ALLOGRAFT;  Surgeon: Phylliss Bob, MD;  Location: [place];  Service: Orthopedics;  Laterality: Right;    OB History   No obstetric history on file.      Home Medications    Prior to Admission medications   Medication Sig Start Date End Date Taking? Authorizing Provider  aspirin EC 81 MG EC tablet Take 1 tablet (81 mg total) by  mouth daily. 09/06/19  Yes Thornell Mule, MD  Blood Glucose Monitoring Suppl (FREESTYLE FREEDOM LITE) w/Device KIT  10/17/19  Yes [provider]  brompheniramine-pseudoephedrine-DM 30-2-10 MG/5ML syrup Take 5 mLs by mouth 4 (four) times daily as needed. 02/13/21  Yes Verda Cumins, MD  fluticasone [facility]) 50 MCG/ACT nasal spray Place 2 sprays into both nostrils daily. 02/11/21  Yes Margarette Canada, NP  glucose blood (FREESTYLE LITE) test strip Used to check blood sugars once a day. 01/09/21  Yes Burnard Hawthorne, FNP  losartan (COZAAR) 25 MG tablet TAKE 1/2 TABLET BY MOUTH 3 TIMES  DAILY 12/27/20 12/27/21 Yes Arnett, Yvetta Coder, FNP  meclizine (ANTIVERT) 50 MG tablet Take 1 tablet (50 mg total) by mouth 3 (three) times daily as needed for dizziness. 01/06/15  Yes Lorin Picket, PA-C  metFORMIN (GLUCOPHAGE) 500 MG tablet Take 1 tablet (500 mg total) by mouth daily with breakfast. 11/18/20 11/18/21 Yes Arnett, Yvetta Coder, FNP  molnupiravir EUA 200 mg CAPS Take 4 capsules (800 mg total) by mouth 2 (two) times daily for 5 days. 02/13/21 02/18/21 Yes Verda Cumins, MD  rosuvastatin (CRESTOR) 20 MG tablet TAKE 1 TABLET BY MOUTH DAILY 11/27/20 11/27/21 Yes Burnard Hawthorne, FNP  TRUEPLUS LANCETS 30G MISC USE AS DIRECTED 03/06/16  Yes Jackolyn Confer, MD  valACYclovir (VALTREX) 500 MG tablet Take 2 tablets (1,000 mg total) by mouth 2 (two) times daily. Take prn cold sore on on day 10/09/19  Yes Juline Patch, MD    Family History Family History  Problem Relation Age of Onset   Diabetes Mother    Diabetes Maternal Grandmother    Hypertension Maternal Grandfather    Breast cancer Neg Hx     Social History Social History   Tobacco Use   Smoking status: Never   Smokeless tobacco: Never  Vaping Use   Vaping Use: Never used  Substance Use Topics   Alcohol use: Yes    Comment: Occasional wine   Drug use: No     Allergies   Influenza vaccine live, Influenza vaccines, Hydrocodone, Morphine and related, and Prednisone   Review of Systems Review of Systems  Constitutional:  Positive for chills and fever. Negative for activity change, appetite change, diaphoresis and fatigue.  HENT:  Positive for congestion, ear pain and postnasal drip. Negative for rhinorrhea, sinus pressure, sinus pain, sneezing and sore throat.   Eyes:  Negative for pain.  Respiratory:  Positive for cough. Negative for chest tightness, shortness of breath and wheezing.   Cardiovascular:  Negative for chest pain and palpitations.  Gastrointestinal:  Negative for abdominal pain, diarrhea, nausea and  vomiting.  Genitourinary:  Negative for dysuria.  Musculoskeletal:  Positive for myalgias. Negative for back pain and neck pain.  Skin:  Negative for color change, pallor, rash and wound.  Neurological:  Positive for headaches. Negative for dizziness and light-headedness.  All other systems reviewed and are negative.   Physical Exam Triage Vital Signs ED Triage Vitals  Enc Vitals Group     BP 02/13/21 1423 123/81     Pulse Rate 02/13/21 1423 (!) 108     Resp 02/13/21 1423 17     Temp 02/13/21 1423 (!) 100.4 F (38 C)     Temp Source 02/13/21 1423 Oral     SpO2 02/13/21 1423 96 %     Weight 02/13/21 1420 156 lb 15.5 oz (71.2 kg)     Height 02/13/21 1420 5' 5.5" (1.664 m)  Head Circumference --      Peak Flow --      Pain Score 02/13/21 1420 5     Pain Loc --      Pain Edu? --      Excl. in [place]? --    No data found.  Updated Vital Signs BP 123/81 (BP Location: Right Arm)   Pulse (!) 108   Temp (!) 100.4 F (38 C) (Oral)   Resp 17   Ht 5' 5.5" (1.664 m)   Wt 71.2 kg   LMP 07/24/2007 Comment: ablation in 2008-pt reports she went through menopause after this  SpO2 96%   BMI 25.72 kg/m   Visual Acuity Right Eye Distance:   Left Eye Distance:   Bilateral Distance:    Right Eye Near:   Left Eye Near:    Bilateral Near:     Physical Exam Vitals and nursing note reviewed.  Constitutional:      General: She is not in acute distress.    Appearance: Normal appearance. She is not ill-appearing, toxic-appearing or diaphoretic.  HENT:     Head: Normocephalic and atraumatic.     Right Ear: Tympanic membrane normal.     Left Ear: Tympanic membrane normal.     Nose: Congestion present. No rhinorrhea.     Mouth/Throat:     Mouth: Mucous membranes are moist.     Pharynx: No oropharyngeal exudate or posterior oropharyngeal erythema.  Eyes:     General: No scleral icterus.       Right eye: No discharge.        Left eye: No discharge.     Extraocular Movements:  Extraocular movements intact.     Conjunctiva/sclera: Conjunctivae normal.     Pupils: Pupils are equal, round, and reactive to light.  Cardiovascular:     Rate and Rhythm: Normal rate and regular rhythm.     Pulses: Normal pulses.     Heart sounds: Normal heart sounds. No murmur heard.   No friction rub. No gallop.  Pulmonary:     Effort: Pulmonary effort is normal.     Breath sounds: Normal breath sounds. No stridor. No wheezing, rhonchi or rales.  Musculoskeletal:     Cervical back: Normal range of motion and neck supple. No rigidity or tenderness.  Lymphadenopathy:     Cervical: Cervical adenopathy present.  Skin:    General: Skin is warm and dry.     Capillary Refill: Capillary refill takes less than 2 seconds.     Coloration: Skin is not jaundiced.     Findings: No erythema or rash.  Neurological:     General: No focal deficit present.     Mental Status: She is alert and oriented to person, place, and time.     UC Treatments / Results  Labs (all labs ordered are listed, but only abnormal results are displayed) Labs Reviewed  RESP PANEL BY RT-PCR (FLU A&B, COVID) ARPGX2 - Abnormal; Notable for the following components:      Result Value   SARS Coronavirus 2 by RT PCR POSITIVE (*)    All other components within normal limits    EKG   Radiology   Procedures Procedures (including critical care time)  Medications Ordered in UC Medications - No data to display  Initial Impression / Assessment and Plan / UC Course  I have reviewed the triage vital signs and the nursing notes.  Pertinent labs & imaging results that were available during my care of the  patient were reviewed by me and considered in my medical decision making (see chart for details).  Clinical impression: 1.  Acute onset of febrile illness. 2.  Upper respiratory tract infection with cough. 3.  Headache due to viral illness. 4.  Nasal congestion with a prior diagnosis of eustachian tube dysfunction  when she was seen in the office 2 days ago.  Treatment plan: 1.  The findings and treatment plan were discussed in detail with the patient.  Patient was in agreement. 2.  Given the acute onset of fever with her respiratory and URI type symptoms I recommended getting a respiratory panel.  It was negative for influenza but positive for COVID. 3.  Prescribed a medication for her cough as well as antiviral medication for her COVID-positive result.  Sent that to her pharmacy. 4.  Plenty of rest, plenty fluids, Tylenol or Motrin for fever or discomfort. 5.  Educational handouts were provided. 6.  Gave her a work note keep her out of work for at least 5 days per current [employer] guidelines.  She can return on day 6 if she is asymptomatic and afebrile. 7.  If symptoms persist she should see her primary care provider, if they worsen in any way then she should go to the ER or call 911. 8.  She was stable upon discharge and she will follow-up here as needed.    Final Clinical Impressions(s) / UC Diagnoses   Final diagnoses:  Febrile illness, acute  Upper respiratory tract infection, unspecified type  Cough  Headache due to viral infection  Nasal congestion  COVID-19     Discharge Instructions      As we discussed, your respiratory panel was negative for flu but positive for COVID. I have provided you medication for your cough. You can continue to use the Flonase and the Allegra. Please see educational handouts. You will need to quarantine for least 5 days and can come out of quarantine if you are asymptomatic and afebrile on day 6. I provided you a work note to that effect. Tylenol or Motrin for any fever or discomfort. Given your medical conditions I am going to go ahead and prescribe one of the newer COVID antiviral medications.     ED Prescriptions     Medication Sig Dispense Auth. Provider   molnupiravir EUA 200 mg CAPS Take 4 capsules (800 mg total) by mouth 2 (two) times daily for 5  days. 40 capsule Verda Cumins, MD   brompheniramine-pseudoephedrine-DM 30-2-10 MG/5ML syrup Take 5 mLs by mouth 4 (four) times daily as needed. 120 mL Verda Cumins, MD      PDMP not reviewed this encounter.   Verda Cumins, MD 02/13/21 [phone]

## 2021-02-13 NOTE — ED Triage Notes (Signed)
Patient states that she has been having ear pain x Sunday. States that she was seen here on Tuesday. Patient states that today she has a fever and headache with chills. States that she has been noticing vertigo and nasal congestion.

## 2021-02-16 ENCOUNTER — Encounter: Payer: Self-pay | Admitting: Family

## 2021-02-18 ENCOUNTER — Other Ambulatory Visit: Payer: Self-pay

## 2021-02-18 ENCOUNTER — Ambulatory Visit
Admission: EM | Admit: 2021-02-18 | Discharge: 2021-02-18 | Disposition: A | Discharge status: Home / Self Care | Payer: 59 | Attending: Family Medicine | Admitting: Family Medicine

## 2021-02-18 ENCOUNTER — Other Ambulatory Visit: Payer: Self-pay | Admitting: Family

## 2021-02-18 ENCOUNTER — Other Ambulatory Visit (HOSPITAL_COMMUNITY): Payer: Self-pay

## 2021-02-18 ENCOUNTER — Telehealth: Payer: Self-pay

## 2021-02-18 DIAGNOSIS — R42 Dizziness and giddiness: Secondary | ICD-10-CM

## 2021-02-18 LAB — COMPREHENSIVE METABOLIC PANEL
ALT: 21 U/L (ref 0–44)
AST: 26 U/L (ref 15–41)
Albumin: 4.4 g/dL (ref 3.5–5.0)
Alkaline Phosphatase: 53 U/L (ref 38–126)
Anion gap: 9 (ref 5–15)
BUN: 13 mg/dL (ref 6–20)
CO2: 25 mmol/L (ref 22–32)
Calcium: 9 mg/dL (ref 8.9–10.3)
Chloride: 104 mmol/L (ref 98–111)
Creatinine, Ser: 0.58 mg/dL (ref 0.44–1.00)
GFR, Estimated: >60 mL/min (ref >60)
Glucose, Bld: 155 mg/dL — ABNORMAL HIGH (ref 70–99)
Potassium: 3.6 mmol/L (ref 3.5–5.1)
Sodium: 138 mmol/L (ref 135–145)
Total Bilirubin: 0.7 mg/dL (ref 0.3–1.2)
Total Protein: 8.1 g/dL (ref 6.5–8.1)

## 2021-02-18 LAB — CBC WITH DIFFERENTIAL/PLATELET
Abs Immature Granulocytes: 0.01 10*3/uL (ref 0.00–0.07)
Basophils Absolute: 0 10*3/uL (ref 0.0–0.1)
Basophils Relative: 0 %
Eosinophils Absolute: 0.1 10*3/uL (ref 0.0–0.5)
Eosinophils Relative: 1 %
HCT: 42.1 % (ref 36.0–46.0)
Hemoglobin: 14.2 g/dL (ref 12.0–15.0)
Immature Granulocytes: 0 %
Lymphocytes Relative: 35 %
Lymphs Abs: 2.4 10*3/uL (ref 0.7–4.0)
MCH: 28.3 pg (ref 26.0–34.0)
MCHC: 33.7 g/dL (ref 30.0–36.0)
MCV: 83.9 fL (ref 80.0–100.0)
Monocytes Absolute: 0.4 10*3/uL (ref 0.1–1.0)
Monocytes Relative: 6 %
Neutro Abs: 3.9 10*3/uL (ref 1.7–7.7)
Neutrophils Relative %: 58 %
Platelets: 236 10*3/uL (ref 150–400)
RBC: 5.02 MIL/uL (ref 3.87–5.11)
RDW: 13.6 % (ref 11.5–15.5)
WBC: 6.7 10*3/uL (ref 4.0–10.5)
nRBC: 0 % (ref 0.0–0.2)

## 2021-02-18 LAB — LIPASE, BLOOD: Lipase: 34 U/L (ref 11–51)

## 2021-02-18 MED ORDER — ONDANSETRON 4 MG PO TBDP: 4.0000 mg | ORAL_TABLET | Freq: Three times a day (TID) | ORAL | 0 refills | Status: DC | PRN

## 2021-02-18 MED ORDER — ONDANSETRON 4 MG PO TBDP
4.0000 mg | ORAL_TABLET | Freq: Three times a day (TID) | ORAL | 0 refills | Status: DC | PRN
  Filled 2021-02-18: qty 20, 7d supply, fill #0

## 2021-02-18 MED ORDER — METFORMIN HCL 500 MG PO TABS
ORAL_TABLET | ORAL | 1 refills | Status: DC
  Filled 2021-02-18: qty 60, 30d supply, fill #0

## 2021-02-18 NOTE — Telephone Encounter (Signed)
PT called in regards to [employer]. PT is wanting to see if Vidal Schwalbe would put in orders for a panel to be done to check her sodium,sugar and other levels as she had Covid on the 30th and believes her levels could be low. 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 PT would also like any advise that Vidal Schwalbe has in regards to this.

## 2021-02-18 NOTE — Discharge Instructions (Addendum)
Lots of fluids and rest.  If you worsen, please go to the ER.  Take care  Dr. Lacinda Axon

## 2021-02-18 NOTE — ED Triage Notes (Signed)
Pt c/o shob and lightheadedness since yesterday. Pt also reports some nausea. Pt has COVID at this time.

## 2021-02-18 NOTE — ED Provider Notes (Signed)
[facility]    CSN: [id number] Arrival date & time: 02/18/21  1306      History   Chief Complaint Lightheadedness  HPI  55 year old female presents with the above complaint.  Symptoms started yesterday and have worsened today.  Patient states that she feels lightheaded and has felt as if she is going to pass out.  She is concerned she may be dehydrated.  Associated nausea.  She is also had shortness of breath.  She has recently been diagnosed with COVID-19.  No fever.  No relieving factors.  Patient is very concerned about her symptoms.  No other complaints at this time.  Past Medical History:  Diagnosis Date   Complication of anesthesia    hard to wake in 2017, patient reports she is 30 lb lighter now   Diabetes mellitus    dx 2014   just take pills   Hyperlipidemia    Hypertension    Neuromuscular disorder [facility])     Patient Active Problem List   Diagnosis Date Noted   Skin soreness 11/18/2020   Cervical adenopathy 04/12/2020   Dysuria 04/12/2020   Vaginal itching 04/12/2020   Acute vaginitis 04/12/2020   Vaccine counseling 03/27/2020   Abnormal SPEP 01/19/2020   Proteinuria 12/20/2019   Angina at rest [facility]) 10/11/2019   Ankle edema 10/11/2019   TIA (transient ischemic attack) 09/05/2019   Radiculopathy 07/26/2019   Leg pain 02/25/2018   Edema 02/25/2018   Hyperlipidemia 12/10/2017   Screening for breast cancer 06/03/2017   Screen for colon cancer 06/03/2017   Bronchitis 12/03/2016   Left hip pain 06/04/2016   Routine general medical examination at a health care facility 03/04/2016   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 ([place]) 09/19/2015   Allergic rhinitis 10/14/2012   Hypertension 11/09/2011   Rash 10/01/2011   Diabetes mellitus type 2, controlled ([place]) 05/21/2011   BMI 30.0-30.9,adult 05/21/2011    Past Surgical History:  Procedure Laterality Date   abalsion     ablation     ANTERIOR CERVICAL DECOMP/DISCECTOMY  FUSION N/A 09/19/2015   Procedure: ANTERIOR CERVICAL DECOMPRESSION/DISCECTOMY FUSION 2 LEVELS;  Surgeon: Phylliss Bob, MD;  Location: [place];  Service: Orthopedics;  Laterality: N/A;  Anterior cervical decompression fusion, cervical 3-4, cervical 4-5 decompression with instrumentation and allograft   BACK SURGERY     FACIAL RECONSTRUCTION SURGERY     TRANSFORAMINAL LUMBAR INTERBODY FUSION (TLIF) WITH PEDICLE SCREW FIXATION 2 LEVEL Right 07/26/2019   Procedure: RIGHT-SIDED LUMBAR 5 - SACRUM 1 TRANSFORAMINAL LUMBAR INTERBODY FUSION WITH INSTRUMENTATION AND ALLOGRAFT;  Surgeon: Phylliss Bob, MD;  Location: [place];  Service: Orthopedics;  Laterality: Right;    OB History   No obstetric history on file.      Home Medications    Prior to Admission medications   Medication Sig Start Date End Date Taking? Authorizing Provider  aspirin EC 81 MG EC tablet Take 1 tablet (81 mg total) by mouth daily. 09/06/19   Thornell Mule, MD  Blood Glucose Monitoring Suppl (FREESTYLE FREEDOM LITE) w/Device KIT  10/17/19   [provider]  brompheniramine-pseudoephedrine-DM 30-2-10 MG/5ML syrup Take 5 mLs by mouth 4 (four) times daily as needed. 02/13/21   Verda Cumins, MD  fluticasone [facility]) 50 MCG/ACT nasal spray Place 2 sprays into both nostrils daily. 02/11/21   Margarette Canada, NP  glucose blood (FREESTYLE LITE) test strip Used to check blood sugars once a day. 01/09/21   Burnard Hawthorne, FNP  losartan (COZAAR) 25 MG  tablet TAKE 1/2 TABLET BY MOUTH 3 TIMES DAILY 12/27/20 12/27/21  Burnard Hawthorne, FNP  meclizine (ANTIVERT) 50 MG tablet Take 1 tablet (50 mg total) by mouth 3 (three) times daily as needed for dizziness. 01/06/15   Lorin Picket, PA-C  metFORMIN (GLUCOPHAGE) 500 MG tablet Take 1 tablet (500 mg total) by mouth daily with breakfast. 11/18/20 11/18/21  Burnard Hawthorne, FNP  metFORMIN (GLUCOPHAGE) 500 MG tablet TAKE 1 TABLET BY MOUTH 2 TIMES DAILY WITH MEALS 02/18/21 02/18/22  Burnard Hawthorne, FNP  molnupiravir EUA 200 mg CAPS Take 4 capsules (800 mg total) by mouth 2 (two) times daily for 5 days. 02/13/21 02/18/21  Verda Cumins, MD  ondansetron (ZOFRAN ODT) 4 MG disintegrating tablet Take 1 tablet (4 mg total) by mouth every 8 (eight) hours as needed for nausea or vomiting. 02/18/21   Coral Spikes, DO  rosuvastatin (CRESTOR) 20 MG tablet TAKE 1 TABLET BY MOUTH DAILY 11/27/20 11/27/21  Burnard Hawthorne, FNP  TRUEPLUS LANCETS 30G MISC USE AS DIRECTED 03/06/16   Jackolyn Confer, MD  valACYclovir (VALTREX) 500 MG tablet Take 2 tablets (1,000 mg total) by mouth 2 (two) times daily. Take prn cold sore on on day 10/09/19   Juline Patch, MD    Family History Family History  Problem Relation Age of Onset   Diabetes Mother    Diabetes Maternal Grandmother    Hypertension Maternal Grandfather    Breast cancer Neg Hx     Social History Social History   Tobacco Use   Smoking status: Never   Smokeless tobacco: Never  Vaping Use   Vaping Use: Never used  Substance Use Topics   Alcohol use: Yes    Comment: Occasional wine   Drug use: No     Allergies   Influenza vaccine live, Influenza vaccines, Hydrocodone, Morphine and related, and Prednisone   Review of Systems Review of Systems  Respiratory:  Positive for shortness of breath.   Gastrointestinal:  Positive for nausea.  Neurological:  Positive for light-headedness.    Physical Exam Triage Vital Signs ED Triage Vitals  Enc Vitals Group     BP 02/18/21 1312 (!) 146/107     Pulse Rate 02/18/21 1312 91     Resp 02/18/21 1312 18     Temp 02/18/21 1312 98.6 F (37 C)     Temp Source 02/18/21 1312 Oral     SpO2 02/18/21 1312 98 %     Weight --      Height --      Head Circumference --      Peak Flow --      Pain Score 02/18/21 1422 4     Pain Loc --      Pain Edu? --      Excl. in [place]? --    Updated Vital Signs BP 117/80 (BP Location: Right Arm)   Pulse 91   Temp 98.6 F (37 C) (Oral)   Resp 18   LMP  07/24/2007 Comment: ablation in 2008-pt reports she went through menopause after this  SpO2 98%   Visual Acuity Right Eye Distance:   Left Eye Distance:   Bilateral Distance:    Right Eye Near:   Left Eye Near:    Bilateral Near:     Physical Exam Vitals and nursing note reviewed.  Constitutional:      General: She is not in acute distress.    Appearance: She is not ill-appearing.  HENT:  Head: Normocephalic and atraumatic.  Eyes:     General:        Right eye: No discharge.        Left eye: No discharge.     Conjunctiva/sclera: Conjunctivae normal.  Cardiovascular:     Rate and Rhythm: Normal rate and regular rhythm.  Pulmonary:     Effort: Pulmonary effort is normal.     Breath sounds: Normal breath sounds. No wheezing, rhonchi or rales.  Neurological:     Mental Status: She is alert.  Psychiatric:     Comments: Anxious.     UC Treatments / Results  Labs (all labs ordered are listed, but only abnormal results are displayed) Labs Reviewed  COMPREHENSIVE METABOLIC PANEL - Abnormal; Notable for the following components:      Result Value   Glucose, Bld 155 (*)    All other components within normal limits  CBC WITH DIFFERENTIAL/PLATELET  LIPASE, BLOOD    EKG   Radiology No results found.  Procedures Procedures (including critical care time)  Medications Ordered in UC Medications - No data to display  Initial Impression / Assessment and Plan / UC Course  I have reviewed the triage vital signs and the nursing notes.  Pertinent labs & imaging results that were available during my care of the patient were reviewed by me and considered in my medical decision making (see chart for details).    55 year old female presents with lightheadedness.  Patient worsening shortness of breath.  No dyspnea noted on exam.  Lungs clear.  No hypoxia.  She also notes some nausea.  Zofran as needed.  Laboratory studies obtained and were unremarkable.  Supportive care and  lots of fluids and rest.   Final Clinical Impressions(s) / UC Diagnoses   Final diagnoses:  Lightheadedness     Discharge Instructions      Lots of fluids and rest.  If you worsen, please go to the ER.  Take care  Dr. Lacinda Axon    ED Prescriptions     Medication Sig Dispense Auth. Provider   ondansetron (ZOFRAN ODT) 4 MG disintegrating tablet Take 1 tablet (4 mg total) by mouth every 8 (eight) hours as needed for nausea or vomiting. 20 tablet Coral Spikes, DO      PDMP not reviewed this encounter.   Coral Spikes, [place] 02/18/21 1459

## 2021-02-20 DIAGNOSIS — Z8673 Personal history of transient ischemic attack (TIA), and cerebral infarction without residual deficits: Secondary | ICD-10-CM | POA: Diagnosis not present

## 2021-02-21 ENCOUNTER — Other Ambulatory Visit (HOSPITAL_COMMUNITY): Payer: Self-pay

## 2021-02-26 ENCOUNTER — Other Ambulatory Visit: Payer: Self-pay

## 2021-02-26 DIAGNOSIS — E119 Type 2 diabetes mellitus without complications: Secondary | ICD-10-CM

## 2021-02-28 ENCOUNTER — Other Ambulatory Visit
Admission: RE | Admit: 2021-02-28 | Discharge: 2021-02-28 | Disposition: A | Discharge status: Home / Self Care | Payer: 59 | Attending: Family | Admitting: Family

## 2021-02-28 ENCOUNTER — Encounter: Payer: Self-pay | Admitting: Family

## 2021-02-28 DIAGNOSIS — E119 Type 2 diabetes mellitus without complications: Secondary | ICD-10-CM | POA: Insufficient documentation

## 2021-02-28 LAB — HEMOGLOBIN A1C
Hgb A1c MFr Bld: 6.8 % — ABNORMAL HIGH (ref 4.8–5.6)
Mean Plasma Glucose: 148.46 mg/dL

## 2021-03-02 ENCOUNTER — Encounter: Payer: Self-pay | Admitting: Family

## 2021-03-03 ENCOUNTER — Other Ambulatory Visit: Payer: Self-pay

## 2021-03-03 DIAGNOSIS — E119 Type 2 diabetes mellitus without complications: Secondary | ICD-10-CM

## 2021-03-03 MED ORDER — METFORMIN HCL 500 MG PO TABS
ORAL_TABLET | ORAL | 1 refills | Status: DC
  Filled 2021-03-03 – 2021-03-26 (×2): qty 60, 30d supply, fill #0
  Filled 2021-05-07: qty 60, 30d supply, fill #1

## 2021-03-03 NOTE — Telephone Encounter (Signed)
Please advise 

## 2021-03-03 NOTE — Telephone Encounter (Signed)
I have spoken to patient in regards to this & she will be calling her ENT Dr. Charolett Bumpers.

## 2021-03-04 DIAGNOSIS — H90A21 Sensorineural hearing loss, unilateral, right ear, with restricted hearing on the contralateral side: Secondary | ICD-10-CM | POA: Diagnosis not present

## 2021-03-04 DIAGNOSIS — H6983 Other specified disorders of Eustachian tube, bilateral: Secondary | ICD-10-CM | POA: Diagnosis not present

## 2021-03-04 DIAGNOSIS — H9121 Sudden idiopathic hearing loss, right ear: Secondary | ICD-10-CM | POA: Diagnosis not present

## 2021-03-04 DIAGNOSIS — H9313 Tinnitus, bilateral: Secondary | ICD-10-CM | POA: Diagnosis not present

## 2021-03-17 DIAGNOSIS — M542 Cervicalgia: Secondary | ICD-10-CM | POA: Diagnosis not present

## 2021-03-21 ENCOUNTER — Encounter: Payer: Self-pay | Admitting: Family

## 2021-03-21 ENCOUNTER — Other Ambulatory Visit (HOSPITAL_COMMUNITY): Payer: Self-pay

## 2021-03-21 ENCOUNTER — Other Ambulatory Visit: Payer: Self-pay

## 2021-03-21 ENCOUNTER — Ambulatory Visit: Payer: 59 | Admitting: Family

## 2021-03-21 ENCOUNTER — Telehealth: Payer: Self-pay | Admitting: Family

## 2021-03-21 VITALS — BP 96/58 | HR 80 | Temp 98.9°F | Ht 65.5 in | Wt 162.0 lb

## 2021-03-21 DIAGNOSIS — R809 Proteinuria, unspecified: Secondary | ICD-10-CM

## 2021-03-21 DIAGNOSIS — I10 Essential (primary) hypertension: Secondary | ICD-10-CM

## 2021-03-21 DIAGNOSIS — E785 Hyperlipidemia, unspecified: Secondary | ICD-10-CM

## 2021-03-21 DIAGNOSIS — E119 Type 2 diabetes mellitus without complications: Secondary | ICD-10-CM | POA: Diagnosis not present

## 2021-03-21 MED ORDER — MECLIZINE HCL 50 MG PO TABS
50.0000 mg | ORAL_TABLET | Freq: Three times a day (TID) | ORAL | 1 refills | Status: DC | PRN
  Filled 2021-03-21: qty 30, 10d supply, fill #0

## 2021-03-21 NOTE — Progress Notes (Signed)
Subjective:    Patient ID: Alyssa Schultz, female    DOB: 08/26/1965, 55 y.o.   MRN: [id number]  CC: Alyssa Schultz is a 55 y.o. female who presents today for follow up.   HPI: Feels well today. No complaints.  Dizziness has completely resolved after covid.   Proteinuria- Compliant with losartan 60m.  No cp, sob.  She continues to follow with nephrology  Diabetes-she is compliant with metformin 500 mg twice daily.  Hyperlipidemia-she is compliant with Crestor 20 mg.   HISTORY:  Past Medical History:  Diagnosis Date   Complication of anesthesia    hard to wake in 2017, patient reports she is 30 lb lighter now   Diabetes mellitus    dx 2014   just take pills   Hyperlipidemia    Hypertension    Neuromuscular disorder ([facility]    Past Surgical History:  Procedure Laterality Date   abalsion     ablation     ANTERIOR CERVICAL DECOMP/DISCECTOMY FUSION N/A 09/19/2015   Procedure: ANTERIOR CERVICAL DECOMPRESSION/DISCECTOMY FUSION 2 LEVELS;  Surgeon: MPhylliss Bob MD;  [place]  Service: Orthopedics;  Laterality: N/A;  Anterior cervical decompression fusion, cervical 3-4, cervical 4-5 decompression with instrumentation and allograft   BACK SURGERY     FACIAL RECONSTRUCTION SURGERY     TRANSFORAMINAL LUMBAR INTERBODY FUSION (TLIF) WITH PEDICLE SCREW FIXATION 2 LEVEL Right 07/26/2019   Procedure: RIGHT-SIDED LUMBAR 5 - SACRUM 1 TRANSFORAMINAL LUMBAR INTERBODY FUSION WITH INSTRUMENTATION AND ALLOGRAFT;  Surgeon: DPhylliss Bob MD;  Location[place]  Service: Orthopedics;  Laterality: Right;   Family History  Problem Relation Age of Onset   Diabetes Mother    Diabetes Maternal Grandmother    Hypertension Maternal Grandfather    Breast cancer Neg Hx     Allergies: Influenza vaccine live, Influenza vaccines, Hydrocodone, Morphine and related, and Prednisone Current Outpatient Medications on File Prior to Visit  Medication Sig Dispense Refill   aspirin EC 81 MG EC tablet Take 1  tablet (81 mg total) by mouth daily. 30 tablet 0   Blood Glucose Monitoring Suppl (FREESTYLE FREEDOM LITE) w/Device KIT      brompheniramine-pseudoephedrine-DM 30-2-10 MG/5ML syrup Take 5 mLs by mouth 4 (four) times daily as needed. 120 mL 0   fluticasone (FLONASE) 50 MCG/ACT nasal spray Place 2 sprays into both nostrils daily. 18.2 mL 1   glucose blood (FREESTYLE LITE) test strip Used to check blood sugars once a day. 100 each 4   losartan (COZAAR) 25 MG tablet TAKE 1/2 TABLET BY MOUTH 3 TIMES DAILY 75 tablet 1   metFORMIN (GLUCOPHAGE) 500 MG tablet TAKE 1 TABLET BY MOUTH 2 TIMES DAILY WITH MEALS 60 tablet 1   ondansetron (ZOFRAN ODT) 4 MG disintegrating tablet Take 1 tablet (4 mg total) by mouth every 8 (eight) hours as needed for nausea or vomiting. 20 tablet 0   rosuvastatin (CRESTOR) 20 MG tablet TAKE 1 TABLET BY MOUTH DAILY 90 tablet 1   TRUEPLUS LANCETS 30G MISC USE AS DIRECTED 100 each 5   valACYclovir (VALTREX) 500 MG tablet Take 2 tablets (1,000 mg total) by mouth 2 (two) times daily. Take prn cold sore on on day 12 tablet 1   No current facility-administered medications on file prior to visit.    Social History   Tobacco Use   Smoking status: Never   Smokeless tobacco: Never  Vaping Use   Vaping Use: Never used  Substance Use Topics   Alcohol use: Yes  Comment: Occasional wine   Drug use: No    Review of Systems  Constitutional:  Negative for chills and fever.  Respiratory:  Negative for cough.   Cardiovascular:  Negative for chest pain and palpitations.  Gastrointestinal:  Negative for nausea and vomiting.  Neurological:  Negative for dizziness (resolved).     Objective:    BP (!) 96/58 (BP Location: Left Arm, Patient Position: Sitting, Cuff Size: Large)   Pulse 80   Temp 98.9 F (37.2 C) (Oral)   Ht 5' 5.5" (1.664 m)   Wt 162 lb (73.5 kg)   LMP 07/24/2007 Comment: ablation in 2008-pt reports she went through menopause after this  SpO2 96%   BMI 26.55 kg/m   BP Readings from Last 3 Encounters:  03/21/21 (!) 96/58  02/18/21 117/80  02/13/21 123/81   Wt Readings from Last 3 Encounters:  03/21/21 162 lb (73.5 kg)  02/13/21 156 lb 15.5 oz (71.2 kg)  02/11/21 157 lb (71.2 kg)    Physical Exam Vitals reviewed.  Constitutional:      Appearance: She is well-developed.  Eyes:     Conjunctiva/sclera: Conjunctivae normal.  Cardiovascular:     Rate and Rhythm: Normal rate and regular rhythm.     Pulses: Normal pulses.     Heart sounds: Normal heart sounds.  Pulmonary:     Effort: Pulmonary effort is normal.     Breath sounds: Normal breath sounds. No wheezing, rhonchi or rales.  Skin:    General: Skin is warm and dry.  Neurological:     Mental Status: She is alert.  Psychiatric:        Speech: Speech normal.        Behavior: Behavior normal.        Thought Content: Thought content normal.       Assessment & Plan:   Problem List Items Addressed This Visit       Cardiovascular and Mediastinum   Hypertension   Relevant Orders   TSH (Completed)     Endocrine   Diabetes mellitus type 2, controlled ([place]) - Primary    Lab Results  Component Value Date   HGBA1C 6.8 (H) 02/28/2021  Anticipate follow-up A1c will be improved.  She will continue metformin 500 mg twice daily.      Relevant Orders   Microalbumin / creatinine urine ratio (Completed)     Other   Hyperlipidemia    Chronic, stable.  Continue Crestor 20       Proteinuria    Chronic, stable.  Continue losartan 25 mg         I am having Alyssa Schultz maintain her TRUEplus Lancets 30G, aspirin, valACYclovir, FreeStyle Freedom Lite, rosuvastatin, losartan, FREESTYLE LITE, fluticasone, brompheniramine-pseudoephedrine-DM, ondansetron, and metFORMIN.   No orders of the defined types were placed in this encounter.   Return precautions given.   Risks, benefits, and alternatives of the medications and treatment plan prescribed today were discussed, and patient  expressed understanding.   Education regarding symptom management and diagnosis given to patient on AVS.  Continue to follow with Alyssa Hawthorne, FNP for routine health maintenance.   Cephus Richer and I agreed with plan.   Mable Paris, FNP

## 2021-03-21 NOTE — Telephone Encounter (Signed)
Patient requesting labs before her next appt. She will have them drawn at the hospital before 06/23/21.

## 2021-03-22 LAB — MICROALBUMIN / CREATININE URINE RATIO
Creatinine, Urine: 56 mg/dL (ref 20–275)
Microalb Creat Ratio: 39 mcg/mg creat — ABNORMAL HIGH (ref <30)
Microalb, Ur: 2.2 mg/dL

## 2021-03-22 LAB — TSH: TSH: 1.33 mIU/L (ref 0.40–4.50)

## 2021-03-24 ENCOUNTER — Other Ambulatory Visit: Payer: Self-pay

## 2021-03-24 DIAGNOSIS — E119 Type 2 diabetes mellitus without complications: Secondary | ICD-10-CM

## 2021-03-24 DIAGNOSIS — I10 Essential (primary) hypertension: Secondary | ICD-10-CM

## 2021-03-24 NOTE — Telephone Encounter (Signed)
Yes a1c, cmp , and microalbumin  Please order, schedule

## 2021-03-24 NOTE — Telephone Encounter (Signed)
LM that labs were ordered for patient. She will have done at lab at [facility] where she works.

## 2021-03-24 NOTE — Assessment & Plan Note (Signed)
Lab Results  Component Value Date   HGBA1C 6.8 (H) 02/28/2021   Anticipate follow-up A1c will be improved.  She will continue metformin 500 mg twice daily.

## 2021-03-24 NOTE — Telephone Encounter (Signed)
Which labs should I order? A1c & CMP, microalbumin/creatinine ratio?

## 2021-03-24 NOTE — Assessment & Plan Note (Signed)
Chronic, stable.  Continue Crestor 20

## 2021-03-24 NOTE — Assessment & Plan Note (Signed)
Chronic, stable.  Continue losartan 25 mg 

## 2021-03-25 ENCOUNTER — Encounter: Payer: Self-pay | Admitting: Family

## 2021-03-26 ENCOUNTER — Other Ambulatory Visit (HOSPITAL_COMMUNITY): Payer: Self-pay

## 2021-03-26 MED ORDER — MECLIZINE HCL 25 MG PO TABS
25.0000 mg | ORAL_TABLET | Freq: Three times a day (TID) | ORAL | 1 refills | Status: DC | PRN
  Filled 2021-03-26: qty 60, 10d supply, fill #0

## 2021-03-28 ENCOUNTER — Telehealth: Payer: 59 | Admitting: Physician Assistant

## 2021-03-28 ENCOUNTER — Encounter: Payer: Self-pay | Admitting: Family

## 2021-03-28 DIAGNOSIS — N390 Urinary tract infection, site not specified: Secondary | ICD-10-CM | POA: Diagnosis not present

## 2021-03-28 MED ORDER — FLUCONAZOLE 150 MG PO TABS: 150.0000 mg | ORAL_TABLET | Freq: Once | ORAL | 0 refills | Status: AC

## 2021-03-28 MED ORDER — CEPHALEXIN 500 MG PO CAPS: 500.0000 mg | ORAL_CAPSULE | Freq: Two times a day (BID) | ORAL | 0 refills | Status: AC

## 2021-03-28 NOTE — Patient Instructions (Signed)
Cephus Richer, thank you for joining Leeanne Rio, PA-C for today's virtual visit.  While this provider is not your primary care provider (PCP), if your PCP is located in our provider database this encounter information will be shared with them immediately following your visit.  Consent: (Patient) Alyssa Schultz provided verbal consent for this virtual visit at the beginning of the encounter.  Current Medications:  Current Outpatient Medications:    aspirin EC 81 MG EC tablet, Take 1 tablet (81 mg total) by mouth daily., Disp: 30 tablet, Rfl: 0   Blood Glucose Monitoring Suppl (FREESTYLE FREEDOM LITE) w/Device KIT, , Disp: , Rfl:    brompheniramine-pseudoephedrine-DM 30-2-10 MG/5ML syrup, Take 5 mLs by mouth 4 (four) times daily as needed., Disp: 120 mL, Rfl: 0   fluticasone (FLONASE) 50 MCG/ACT nasal spray, Place 2 sprays into both nostrils daily., Disp: 18.2 mL, Rfl: 1   glucose blood (FREESTYLE LITE) test strip, Used to check blood sugars once a day., Disp: 100 each, Rfl: 4   losartan (COZAAR) 25 MG tablet, TAKE 1/2 TABLET BY MOUTH 3 TIMES DAILY, Disp: 75 tablet, Rfl: 1   meclizine (ANTIVERT) 25 MG tablet, Take 2 tablets (50 mg total) by mouth 3 (three) times daily as needed for dizziness, Disp: 60 tablet, Rfl: 1   meclizine (ANTIVERT) 50 MG tablet, Take 1 tablet (50 mg total) by mouth 3 (three) times daily as needed for dizziness., Disp: 30 tablet, Rfl: 1   metFORMIN (GLUCOPHAGE) 500 MG tablet, TAKE 1 TABLET BY MOUTH 2 TIMES DAILY WITH MEALS, Disp: 60 tablet, Rfl: 1   ondansetron (ZOFRAN ODT) 4 MG disintegrating tablet, Take 1 tablet (4 mg total) by mouth every 8 (eight) hours as needed for nausea or vomiting., Disp: 20 tablet, Rfl: 0   rosuvastatin (CRESTOR) 20 MG tablet, TAKE 1 TABLET BY MOUTH DAILY, Disp: 90 tablet, Rfl: 1   TRUEPLUS LANCETS 30G MISC, USE AS DIRECTED, Disp: 100 each, Rfl: 5   valACYclovir (VALTREX) 500 MG tablet, Take 2 tablets (1,000 mg total) by mouth 2 (two)  times daily. Take prn cold sore on on day, Disp: 12 tablet, Rfl: 1   Medications ordered in this encounter:  No orders of the defined types were placed in this encounter.    *If you need refills on other medications prior to your next appointment, please contact your pharmacy*  Follow-Up: Call back or seek an in-person evaluation if the symptoms worsen or if the condition fails to improve as anticipated.  Other Instructions Your symptoms are consistent with a bladder infection, also called acute cystitis. Please take your antibiotic (Keflex) as directed until all pills are gone.  Stay very well hydrated.  Consider a daily probiotic (Align, Culturelle, or Activia) to help prevent stomach upset caused by the antibiotic.  Taking a probiotic daily may also help prevent recurrent UTIs.  Also consider taking AZO (Phenazopyridine) tablets to help decrease pain with urination.   If symptoms are not resolving with medication, anything worsens or new symptoms develop, you will need an in-person evaluation with urinalysis and culture.   Urinary Tract Infection A urinary tract infection (UTI) can occur any place along the urinary tract. The tract includes the kidneys, ureters, bladder, and urethra. A type of germ called bacteria often causes a UTI. UTIs are often helped with antibiotic medicine.  HOME CARE  If given, take antibiotics as told by your doctor. Finish them even if you start to feel better. Drink enough fluids to keep your  pee (urine) clear or pale yellow. Avoid tea, drinks with caffeine, and bubbly (carbonated) drinks. Pee often. Avoid holding your pee in for a long time. Pee before and after having sex (intercourse). Wipe from front to back after you poop (bowel movement) if you are a woman. Use each tissue only once. GET HELP RIGHT AWAY IF:  You have back pain. You have lower belly (abdominal) pain. You have chills. You feel sick to your stomach (nauseous). You throw up  (vomit). Your burning or discomfort with peeing does not go away. You have a fever. Your symptoms are not better in 3 days. MAKE SURE YOU:  Understand these instructions. Will watch your condition. Will get help right away if you are not doing well or get worse. Document Released: 01/20/2008 Document Revised: 04/27/2012 Document Reviewed: 03/03/2012 [facility] Patient Information 2015 [place], [place]. This information is not intended to replace advice given to you by your health care provider. Make sure you discuss any questions you have with your health care provider.    If you have been instructed to have an in-person evaluation today at a local Urgent Care facility, please use the link below. It will take you to a list of all of our available [facility], including address, phone number and hours of operation. Please do not delay care.  [facility]  If you or a family member do not have a primary care provider, use the link below to schedule a visit and establish care. When you choose a [facility] primary care physician or advanced practice provider, you gain a long-term partner in health. Find a Primary Care Provider  Learn more about [facility]'s in-office and virtual care options: [facility]

## 2021-03-28 NOTE — Progress Notes (Signed)
Virtual Visit Consent   Alyssa Schultz, you are scheduled for a virtual visit with a [place] provider today.     Just as with appointments in the office, your consent must be obtained to participate.  Your consent will be active for this visit and any virtual visit you may have with one of our providers in the next 365 days.     If you have a MyChart account, a copy of this consent can be sent to you electronically.  All virtual visits are billed to your insurance company just like a traditional visit in the office.    As this is a virtual visit, video technology does not allow for your provider to perform a traditional examination.  This may limit your provider's ability to fully assess your condition.  If your provider identifies any concerns that need to be evaluated in person or the need to arrange testing (such as labs, EKG, etc.), we will make arrangements to do so.     Although advances in technology are sophisticated, we cannot ensure that it will always work on either your end or our end.  If the connection with a video visit is poor, the visit may have to be switched to a telephone visit.  With either a video or telephone visit, we are not always able to ensure that we have a secure connection.     I need to obtain your verbal consent now.   Are you willing to proceed with your visit today?    Alyssa Schultz has provided verbal consent on 03/28/2021 for a virtual visit (video or telephone).   Leeanne Rio, [place]   Date: 03/28/2021 11:48 AM   Virtual Visit via Video Note   I, Leeanne Rio, connected with  Alyssa Schultz  ([id number], Jul 24, 1966) on 03/28/21 at 11:30 AM EDT by a video-enabled telemedicine application and verified that I am speaking with the correct person using two identifiers.  Location: Patient: Virtual Visit Location Patient: Home Provider: Virtual Visit Location Provider: Other: Work    I discussed the limitations of evaluation and management by  telemedicine and the availability of in person appointments. The patient expressed understanding and agreed to proceed.    History of Present Illness: Alyssa Schultz is a 55 y.o. who identifies as a female who was assigned female at birth, and is being seen today for possible UTI. Patient endorses foul-smelling urine x 2 days. Along with this she has noted some suprapubic pressure, dysuria. Denies fevers, vomiting, hematuria, back pain. Denies vaginal symptoms.   HPI: HPI  Problems:  Patient Active Problem List   Diagnosis Date Noted   Skin soreness 11/18/2020   Cervical adenopathy 04/12/2020   Dysuria 04/12/2020   Vaginal itching 04/12/2020   Acute vaginitis 04/12/2020   Vaccine counseling 03/27/2020   Abnormal SPEP 01/19/2020   Proteinuria 12/20/2019   Angina at rest [facility]) 10/11/2019   Ankle edema 10/11/2019   TIA (transient ischemic attack) 09/05/2019   Radiculopathy 07/26/2019   Leg pain 02/25/2018   Edema 02/25/2018   Hyperlipidemia 12/10/2017   Screening for breast cancer 06/03/2017   Screen for colon cancer 06/03/2017   Bronchitis 12/03/2016   Left hip pain 06/04/2016   Routine general medical examination at a health care facility 03/04/2016   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 ([place]) 09/19/2015   Allergic rhinitis 10/14/2012   Hypertension 11/09/2011   Rash 10/01/2011   Diabetes mellitus type 2, controlled ([place]) 05/21/2011  BMI 30.0-30.9,adult 05/21/2011    Allergies:  Allergies  Allergen Reactions   Influenza Vaccine Live     Per Dr - Pt is to not receive vaccine    Influenza Vaccines     Per Dr - Pt is to not receive vaccine    Hydrocodone Rash   Morphine And Related Other (See Comments)    Makes her crazy   Prednisone     "makes me crazy"   Medications:  Current Outpatient Medications:    cephALEXin (KEFLEX) 500 MG capsule, Take 1 capsule (500 mg total) by mouth 2 (two) times daily for 7 days., Disp: 14 capsule, Rfl: 0    fluconazole (DIFLUCAN) 150 MG tablet, Take 1 tablet (150 mg total) by mouth once for 1 dose., Disp: 1 tablet, Rfl: 0   aspirin EC 81 MG EC tablet, Take 1 tablet (81 mg total) by mouth daily., Disp: 30 tablet, Rfl: 0   Blood Glucose Monitoring Suppl (FREESTYLE FREEDOM LITE) w/Device KIT, , Disp: , Rfl:    fluticasone (FLONASE) 50 MCG/ACT nasal spray, Place 2 sprays into both nostrils daily., Disp: 18.2 mL, Rfl: 1   glucose blood (FREESTYLE LITE) test strip, Used to check blood sugars once a day., Disp: 100 each, Rfl: 4   losartan (COZAAR) 25 MG tablet, TAKE 1/2 TABLET BY MOUTH 3 TIMES DAILY, Disp: 75 tablet, Rfl: 1   meclizine (ANTIVERT) 25 MG tablet, Take 2 tablets (50 mg total) by mouth 3 (three) times daily as needed for dizziness, Disp: 60 tablet, Rfl: 1   meclizine (ANTIVERT) 50 MG tablet, Take 1 tablet (50 mg total) by mouth 3 (three) times daily as needed for dizziness., Disp: 30 tablet, Rfl: 1   metFORMIN (GLUCOPHAGE) 500 MG tablet, TAKE 1 TABLET BY MOUTH 2 TIMES DAILY WITH MEALS, Disp: 60 tablet, Rfl: 1   ondansetron (ZOFRAN ODT) 4 MG disintegrating tablet, Take 1 tablet (4 mg total) by mouth every 8 (eight) hours as needed for nausea or vomiting., Disp: 20 tablet, Rfl: 0   rosuvastatin (CRESTOR) 20 MG tablet, TAKE 1 TABLET BY MOUTH DAILY, Disp: 90 tablet, Rfl: 1   TRUEPLUS LANCETS 30G MISC, USE AS DIRECTED, Disp: 100 each, Rfl: 5   valACYclovir (VALTREX) 500 MG tablet, Take 2 tablets (1,000 mg total) by mouth 2 (two) times daily. Take prn cold sore on on day, Disp: 12 tablet, Rfl: 1  Observations/Objective: Patient is well-developed, well-nourished in no acute distress.  Resting comfortably at home.  Head is normocephalic, atraumatic.  No labored breathing. Speech is clear and coherent with logical content.  Patient is alert and oriented at baseline.   Assessment and Plan: 1. UTI (urinary tract infection), uncomplicated - cephALEXin (KEFLEX) 500 MG capsule; Take 1 capsule (500 mg  total) by mouth 2 (two) times daily for 7 days.  Dispense: 14 capsule; Refill: 0 Classic symptoms. No alarm signs/symptoms present. Will treat empirically for uncomplicated UTI with Keflex 500 mg BID x 7 days. Supportive measures and OTC medications reviewed with patient. Rx Diflucan if needed for yeast from antibiotics. She is to start a daily probiotic to help prevent this.   Follow Up Instructions: I discussed the assessment and treatment plan with the patient. The patient was provided an opportunity to ask questions and all were answered. The patient agreed with the plan and demonstrated an understanding of the instructions.  A copy of instructions were sent to the patient via MyChart.  The patient was advised to call back or seek an in-person  evaluation if the symptoms worsen or if the condition fails to improve as anticipated.  Time:  I spent 12 minutes with the patient via telehealth technology discussing the above problems/concerns.    Leeanne Rio, PA-C

## 2021-04-10 ENCOUNTER — Encounter: Payer: Self-pay | Admitting: Family

## 2021-04-16 DIAGNOSIS — R809 Proteinuria, unspecified: Secondary | ICD-10-CM | POA: Diagnosis not present

## 2021-04-17 ENCOUNTER — Encounter: Payer: Self-pay | Admitting: Family

## 2021-04-18 ENCOUNTER — Other Ambulatory Visit: Payer: Self-pay | Admitting: Family

## 2021-04-18 DIAGNOSIS — R809 Proteinuria, unspecified: Secondary | ICD-10-CM

## 2021-04-23 ENCOUNTER — Encounter: Payer: Self-pay | Admitting: Family

## 2021-04-30 ENCOUNTER — Encounter: Payer: Self-pay | Admitting: Family

## 2021-05-01 NOTE — Telephone Encounter (Signed)
I spoke with patient this morning & she said that her leg was feeling better after icing last night. She also used Biofreeze, which also helped the pain. Her swelling had also gone down. I advised with the her having pain & swelling only if the right shin that she would need to be evaluated in person ASAP. She stated that this first occurred last Thursday when she wore very flat shoes to her job at [employer] she has had two previous back surgeries. I told her [name] was not here & normally xrays as well as [place] need an appointment for evaluation before ordering. It just was not safe or appropriate not to be seen. Pt stated that she did not feel it was urgent now, but that she would have her shin looked at by [facility] since she works in the same building. I asked that patient please do so to be safe.

## 2021-05-07 ENCOUNTER — Other Ambulatory Visit: Payer: Self-pay | Admitting: Family

## 2021-05-07 ENCOUNTER — Other Ambulatory Visit (HOSPITAL_COMMUNITY): Payer: Self-pay

## 2021-05-07 MED ORDER — LOSARTAN POTASSIUM 25 MG PO TABS
ORAL_TABLET | ORAL | 1 refills | Status: DC
  Filled 2021-05-07: qty 75, 50d supply, fill #0
  Filled 2021-09-03: qty 75, 50d supply, fill #1

## 2021-05-15 ENCOUNTER — Other Ambulatory Visit: Payer: Self-pay | Admitting: Family

## 2021-05-15 ENCOUNTER — Other Ambulatory Visit (HOSPITAL_COMMUNITY): Payer: Self-pay

## 2021-05-15 DIAGNOSIS — E785 Hyperlipidemia, unspecified: Secondary | ICD-10-CM

## 2021-05-15 MED ORDER — ROSUVASTATIN CALCIUM 20 MG PO TABS
ORAL_TABLET | Freq: Every day | ORAL | 1 refills | Status: DC
  Filled 2021-05-15: qty 90, 90d supply, fill #0
  Filled 2021-09-03: qty 90, 90d supply, fill #1

## 2021-05-19 ENCOUNTER — Telehealth: Payer: Self-pay

## 2021-05-19 ENCOUNTER — Other Ambulatory Visit: Payer: Self-pay

## 2021-05-19 NOTE — Telephone Encounter (Signed)
LVM for pt to return my call to schedule urgent office appt.

## 2021-05-20 NOTE — Telephone Encounter (Signed)
Pt returned call and was scheduled for an office appt on 05/21/21.

## 2021-05-21 ENCOUNTER — Other Ambulatory Visit: Payer: Self-pay

## 2021-05-21 ENCOUNTER — Ambulatory Visit: Payer: 59 | Admitting: Gastroenterology

## 2021-05-21 VITALS — BP 93/57 | HR 73 | Temp 98.4°F | Ht 65.5 in | Wt 161.4 lb

## 2021-05-21 DIAGNOSIS — R195 Other fecal abnormalities: Secondary | ICD-10-CM

## 2021-05-21 MED ORDER — NA SULFATE-K SULFATE-MG SULF 17.5-3.13-1.6 GM/177ML PO SOLN
1.0000 | Freq: Once | ORAL | 0 refills | Status: AC
  Filled 2021-05-21 – 2021-06-18 (×2): qty 354, 1d supply, fill #0

## 2021-05-21 NOTE — Addendum Note (Signed)
Addended by: Lurlean Nanny on: 05/21/2021 04:31 PM   Modules accepted: Orders, SmartSet

## 2021-05-21 NOTE — Progress Notes (Signed)
Alyssa Schultz [address]  [place]  [place], [place] [address]  Main: [phone]  Fax: [phone]   Gastroenterology Consultation  Referring Provider:     Burnard Hawthorne, FNP Primary Care Physician:  Burnard Hawthorne, FNP Reason for Consultation:     Positive Cologuard        HPI:    Chief Complaint  Patient presents with   New Patient (Initial Visit)    Positive colorectal cancer screening using Cologuard test   Pt denies any current Sx, no family Hx--- would like to schedule for December 2022    Alyssa Schultz is a 55 y.o. y/o female referred for consultation & management  by Dr. Vidal Schwalbe, Yvetta Coder, FNP.  This patient was scheduled in our clinic as an urgent referral for positive Cologuard.  Previous records show that patient was recently seen by [facility] clinic GI on March 19, 2021 by [place] November.  This states that patient had a positive Cologuard test and original appointment was scheduled for July 2021, but this was moved due to "provider illness" and that subsequent appointments were rescheduled by patient, with other appointments moved by their clinic as well.  Patient states her Cologuard test was positive over a year ago.  I do not have the Cologuard testing report itself.  The current clinic GI note states that her insurance did not cover colonoscopy [facility] [facility], but just with [facility] and so patient was referred over here.  The patient denies abdominal or flank pain, anorexia, nausea or vomiting, dysphagia, change in bowel habits or black or bloody stools or weight loss.  Recent labs otherwise reassuring with no anemia on CBC.  CMP showed normal liver enzymes  Past Medical History:  Diagnosis Date   Complication of anesthesia    hard to wake in 2017, patient reports she is 30 lb lighter now   Diabetes mellitus    dx 2014   just take pills   Hyperlipidemia    Hypertension    Neuromuscular disorder [facility])     Past Surgical History:   Procedure Laterality Date   abalsion     ablation     ANTERIOR CERVICAL DECOMP/DISCECTOMY FUSION N/A 09/19/2015   Procedure: ANTERIOR CERVICAL DECOMPRESSION/DISCECTOMY FUSION 2 LEVELS;  Surgeon: Phylliss Bob, MD;  Location: [place];  Service: Orthopedics;  Laterality: N/A;  Anterior cervical decompression fusion, cervical 3-4, cervical 4-5 decompression with instrumentation and allograft   BACK SURGERY     FACIAL RECONSTRUCTION SURGERY     TRANSFORAMINAL LUMBAR INTERBODY FUSION (TLIF) WITH PEDICLE SCREW FIXATION 2 LEVEL Right 07/26/2019   Procedure: RIGHT-SIDED LUMBAR 5 - SACRUM 1 TRANSFORAMINAL LUMBAR INTERBODY FUSION WITH INSTRUMENTATION AND ALLOGRAFT;  Surgeon: Phylliss Bob, MD;  Location: [place];  Service: Orthopedics;  Laterality: Right;    Prior to Admission medications   Medication Sig Start Date End Date Taking? Authorizing Provider  aspirin EC 81 MG EC tablet Take 1 tablet (81 mg total) by mouth daily. 09/06/19  Yes Thornell Mule, MD  Blood Glucose Monitoring Suppl (FREESTYLE FREEDOM LITE) w/Device KIT  10/17/19  Yes [provider]  fluticasone (FLONASE) 50 MCG/ACT nasal spray Place 2 sprays into both nostrils daily. 02/11/21  Yes Margarette Canada, NP  glucose blood (FREESTYLE LITE) test strip Used to check blood sugars once a day. 01/09/21  Yes Burnard Hawthorne, FNP  losartan (COZAAR) 25 MG tablet TAKE 1/2 TABLET BY MOUTH 3 TIMES DAILY 05/07/21 05/07/22 Yes Arnett, Yvetta Coder, FNP  meclizine (ANTIVERT) 25 MG tablet Take 2 tablets (50 mg total) by mouth 3 (three) times daily as needed for dizziness 03/21/21  Yes Burnard Hawthorne, FNP  metFORMIN (GLUCOPHAGE) 500 MG tablet TAKE 1 TABLET BY MOUTH 2 TIMES DAILY WITH MEALS 03/03/21 03/03/22 Yes Arnett, Yvetta Coder, FNP  Na Sulfate-K Sulfate-Mg Sulf 17.5-3.13-1.6 GM/177ML SOLN Take 1 kit by mouth once for 1 dose. 05/21/21 05/21/21 Yes Virgel Manifold, MD  ondansetron (ZOFRAN ODT) 4 MG disintegrating tablet Take 1 tablet (4 mg total) by mouth  every 8 (eight) hours as needed for nausea or vomiting. 02/18/21  Yes Cook, Jayce G, DO  rosuvastatin (CRESTOR) 20 MG tablet TAKE 1 TABLET BY MOUTH DAILY 05/15/21 05/15/22 Yes Burnard Hawthorne, FNP  TRUEPLUS LANCETS 30G MISC USE AS DIRECTED 03/06/16  Yes Jackolyn Confer, MD  valACYclovir (VALTREX) 500 MG tablet Take 2 tablets (1,000 mg total) by mouth 2 (two) times daily. Take prn cold sore on on day 10/09/19  Yes Juline Patch, MD    Family History  Problem Relation Age of Onset   Diabetes Mother    Diabetes Maternal Grandmother    Hypertension Maternal Grandfather    Breast cancer Neg Hx      Social History   Tobacco Use   Smoking status: Never   Smokeless tobacco: Never  Vaping Use   Vaping Use: Never used  Substance Use Topics   Alcohol use: Yes    Comment: Occasional wine   Drug use: No    Allergies as of 05/21/2021 - Review Complete 05/21/2021  Allergen Reaction Noted   Influenza vaccine live  05/21/2011   Influenza vaccines  05/21/2011   Hydrocodone Rash 09/06/2015   Morphine and related Other (See Comments) 09/06/2015   Prednisone  09/19/2015    Review of Systems:    All systems reviewed and negative except where noted in HPI.   Physical Exam:  Constitutional: General:   Alert,  Well-developed, well-nourished, pleasant and cooperative in NAD BP (!) 93/57   Pulse 73   Temp 98.4 F (36.9 C) (Oral)   Ht 5' 5.5" (1.664 m)   Wt 161 lb 6.4 oz (73.2 kg)   LMP 07/24/2007 Comment: ablation in 2008-pt reports she went through menopause after this  BMI 26.45 kg/m   Eyes:  Sclera clear, no icterus.   Conjunctiva pink. PERRLA  Ears:  No scars, lesions or masses, Normal auditory acuity. Nose:  No deformity, discharge, or lesions. Mouth:  No deformity or lesions, oropharynx pink & moist.  Neck:  Supple; no masses or thyromegaly.  Respiratory: Normal respiratory effort, Normal percussion  Gastrointestinal: Soft, non-tender and non-distended without masses,  hepatosplenomegaly or hernias noted.  No guarding or rebound tenderness.     Cardiac: No clubbing or edema.  No cyanosis. Normal posterior tibial pedal pulses noted.  Lymphatic:  No significant cervical or axillary adenopathy.  Psych:  Alert and cooperative. Normal mood and affect.  Musculoskeletal:  Normal gait. Head normocephalic, atraumatic. Symmetrical without gross deformities. 5/5 Upper and Lower extremity strength bilaterally.  Skin: Warm. Intact without significant lesions or rashes. No jaundice.  Neurologic:  Face symmetrical, tongue midline, Normal sensation to touch;  grossly normal neurologically.  Psych:  Alert and oriented x3, Alert and cooperative. Normal mood and affect.   Labs: CBC    Component Value Date/Time   WBC 6.7 02/18/2021 1340   RBC 5.02 02/18/2021 1340   HGB 14.2 02/18/2021 1340   HGB 14.8 11/22/2011 1114  HCT 42.1 02/18/2021 1340   HCT 44.7 11/22/2011 1114   PLT 236 02/18/2021 1340   PLT 199 11/22/2011 1114   MCV 83.9 02/18/2021 1340   MCV 89 11/22/2011 1114   MCH 28.3 02/18/2021 1340   MCHC 33.7 02/18/2021 1340   RDW 13.6 02/18/2021 1340   RDW 14.3 11/22/2011 1114   LYMPHSABS 2.4 02/18/2021 1340   MONOABS 0.4 02/18/2021 1340   EOSABS 0.1 02/18/2021 1340   BASOSABS 0.0 02/18/2021 1340   CMP     Component Value Date/Time   NA 138 02/18/2021 1340   NA 140 09/12/2019 0835   NA 142 11/22/2011 1114   K 3.6 02/18/2021 1340   K 4.2 11/22/2011 1114   CL 104 02/18/2021 1340   CL 104 11/22/2011 1114   CO2 25 02/18/2021 1340   CO2 29 11/22/2011 1114   GLUCOSE 155 (H) 02/18/2021 1340   GLUCOSE 115 (H) 11/22/2011 1114   BUN 13 02/18/2021 1340   BUN 15 09/12/2019 0835   BUN 12 11/22/2011 1114   CREATININE 0.58 02/18/2021 1340   CREATININE 0.66 02/26/2012 1538   CALCIUM 9.0 02/18/2021 1340   CALCIUM 9.2 11/22/2011 1114   PROT 8.1 02/18/2021 1340   PROT 8.0 09/12/2019 0835   PROT 8.5 (H) 11/22/2011 1114   ALBUMIN 4.4 02/18/2021 1340    ALBUMIN 4.9 09/12/2019 0835   ALBUMIN 4.1 11/22/2011 1114   AST 26 02/18/2021 1340   AST 25 11/22/2011 1114   ALT 21 02/18/2021 1340   ALT 26 11/22/2011 1114   ALKPHOS 53 02/18/2021 1340   ALKPHOS 66 11/22/2011 1114   BILITOT 0.7 02/18/2021 1340   BILITOT 0.4 09/12/2019 0835   BILITOT 0.6 11/22/2011 1114   GFRNONAA >60 02/18/2021 1340   GFRNONAA >60 11/22/2011 1114   GFRAA >60 02/01/2020 0808   GFRAA >60 11/22/2011 1114    Imaging Studies: No results found.  Assessment and Plan:   Alyssa Schultz is a 55 y.o. y/o female has been referred for positive Cologuard test  We discussed the etiology that Cologuard could be positive from.  We have encouraged her to go ahead and schedule her colonoscopy given that it was positive over a year ago and colonoscopy has not been done until now.  However, patient states that the earliest that she would schedule her procedure would be end of November as she is traveling and has flights and other activities that she cannot cancel.  We did discuss with her that this may delay a potential diagnosis of colorectal malignancy if it is present, and she understands this risk, but would like to schedule her procedure after November 25.  We will attempt to schedule the soonest date possible after November 25 as per her wishes  I have discussed alternative options, risks & benefits,  which include, but are not limited to, bleeding, infection, perforation,respiratory complication & drug reaction.  The patient agrees with this plan & written consent will be obtained.      Dr Alyssa Schultz  Speech recognition software was used to dictate the above note.

## 2021-06-02 ENCOUNTER — Encounter: Payer: Self-pay | Admitting: Family

## 2021-06-04 ENCOUNTER — Other Ambulatory Visit (HOSPITAL_COMMUNITY): Payer: Self-pay

## 2021-06-04 ENCOUNTER — Other Ambulatory Visit: Payer: Self-pay | Admitting: Family

## 2021-06-04 MED ORDER — METFORMIN HCL 500 MG PO TABS
ORAL_TABLET | ORAL | 1 refills | Status: DC
  Filled 2021-06-04: qty 60, 30d supply, fill #0

## 2021-06-11 ENCOUNTER — Other Ambulatory Visit
Admission: RE | Admit: 2021-06-11 | Discharge: 2021-06-11 | Disposition: A | Discharge status: Home / Self Care | Payer: 59 | Attending: Family | Admitting: Family

## 2021-06-11 DIAGNOSIS — E119 Type 2 diabetes mellitus without complications: Secondary | ICD-10-CM | POA: Diagnosis not present

## 2021-06-11 DIAGNOSIS — I10 Essential (primary) hypertension: Secondary | ICD-10-CM | POA: Diagnosis not present

## 2021-06-11 LAB — COMPREHENSIVE METABOLIC PANEL
ALT: 16 U/L (ref 0–44)
AST: 21 U/L (ref 15–41)
Albumin: 4.2 g/dL (ref 3.5–5.0)
Alkaline Phosphatase: 52 U/L (ref 38–126)
Anion gap: 6 (ref 5–15)
BUN: 13 mg/dL (ref 6–20)
CO2: 27 mmol/L (ref 22–32)
Calcium: 9 mg/dL (ref 8.9–10.3)
Chloride: 105 mmol/L (ref 98–111)
Creatinine, Ser: 0.64 mg/dL (ref 0.44–1.00)
GFR, Estimated: >60 mL/min (ref >60)
Glucose, Bld: 97 mg/dL (ref 70–99)
Potassium: 3.7 mmol/L (ref 3.5–5.1)
Sodium: 138 mmol/L (ref 135–145)
Total Bilirubin: 0.7 mg/dL (ref 0.3–1.2)
Total Protein: 7.9 g/dL (ref 6.5–8.1)

## 2021-06-11 LAB — HEMOGLOBIN A1C
Hgb A1c MFr Bld: 6.4 % — ABNORMAL HIGH (ref 4.8–5.6)
Mean Plasma Glucose: 136.98 mg/dL

## 2021-06-12 LAB — MICROALBUMIN / CREATININE URINE RATIO
Creatinine, Urine: 60.9 mg/dL
Microalb Creat Ratio: 116 mg/g creat — ABNORMAL HIGH (ref 0–29)
Microalb, Ur: 70.5 ug/mL — ABNORMAL HIGH

## 2021-06-13 ENCOUNTER — Encounter: Payer: Self-pay | Admitting: Family

## 2021-06-13 ENCOUNTER — Other Ambulatory Visit: Payer: Self-pay | Admitting: Family

## 2021-06-17 ENCOUNTER — Encounter: Payer: Self-pay | Admitting: Family

## 2021-06-18 ENCOUNTER — Other Ambulatory Visit: Payer: Self-pay

## 2021-06-18 ENCOUNTER — Other Ambulatory Visit: Payer: Self-pay | Admitting: Family

## 2021-06-18 DIAGNOSIS — R809 Proteinuria, unspecified: Secondary | ICD-10-CM

## 2021-06-18 NOTE — Progress Notes (Signed)
Ref placed.

## 2021-06-23 ENCOUNTER — Other Ambulatory Visit (HOSPITAL_COMMUNITY)
Admission: RE | Admit: 2021-06-23 | Discharge: 2021-06-23 | Disposition: A | Discharge status: Home / Self Care | Payer: 59 | Source: Ambulatory Visit | Attending: Family | Admitting: Family

## 2021-06-23 ENCOUNTER — Other Ambulatory Visit: Payer: Self-pay

## 2021-06-23 ENCOUNTER — Encounter: Payer: Self-pay | Admitting: Family

## 2021-06-23 ENCOUNTER — Ambulatory Visit: Payer: 59 | Admitting: Family

## 2021-06-23 VITALS — BP 110/64 | HR 76 | Temp 98.9°F | Ht 65.5 in | Wt 163.2 lb

## 2021-06-23 DIAGNOSIS — R809 Proteinuria, unspecified: Secondary | ICD-10-CM | POA: Diagnosis not present

## 2021-06-23 DIAGNOSIS — L299 Pruritus, unspecified: Secondary | ICD-10-CM | POA: Diagnosis not present

## 2021-06-23 DIAGNOSIS — E785 Hyperlipidemia, unspecified: Secondary | ICD-10-CM | POA: Diagnosis not present

## 2021-06-23 DIAGNOSIS — N898 Other specified noninflammatory disorders of vagina: Secondary | ICD-10-CM

## 2021-06-23 DIAGNOSIS — E119 Type 2 diabetes mellitus without complications: Secondary | ICD-10-CM

## 2021-06-23 NOTE — Assessment & Plan Note (Addendum)
Lab Results  Component Value Date   HGBA1C 6.4 (H) 06/11/2021   Chronic, stable.  Continue metformin 500 mg twice daily.  Counseled on importance of following low glycemic diet

## 2021-06-23 NOTE — Progress Notes (Signed)
Subjective:    Patient ID: Alyssa Schultz, female    DOB: [birth year], 55 y.o.   MRN: [id number]  CC: Alyssa Schultz is a 55 y.o. female who presents today for follow up.   HPI: Complains of vaginal itching past couple of days.  She prefers to self swab No recent antibiotic.   No dysuria, fever, hematuria.  She would like urine checked as well for infection.  No concerns for STDs.  Walks 3 miles daily.   She had been eating out more often the past couple of months She has returned back to cooking/prepping from home.  She is following low glycemic diet  DM-compliant metformin 500 mg twice daily HLD- compliant with Crestor 86m Proteinuria-continues to follow with Dr LHolley Raringfollow-up scheduled 07/01/2021.  She also [facility]  Dr. KKathrynn Ducking She is awaiting appointment for this.  HISTORY:  Past Medical History:  Diagnosis Date   Complication of anesthesia    hard to wake in 2017, patient reports she is 30 lb lighter now   Diabetes mellitus    dx 2014   just take pills   Hyperlipidemia    Hypertension    Neuromuscular disorder ([facility]    Past Surgical History:  Procedure Laterality Date   abalsion     ablation     ANTERIOR CERVICAL DECOMP/DISCECTOMY FUSION N/A 09/19/2015   Procedure: ANTERIOR CERVICAL DECOMPRESSION/DISCECTOMY FUSION 2 LEVELS;  Surgeon: MPhylliss Bob MD;  [place] [facility]  Service: Orthopedics;  Laterality: N/A;  Anterior cervical decompression fusion, cervical 3-4, cervical 4-5 decompression with instrumentation and allograft   BACK SURGERY     FACIAL RECONSTRUCTION SURGERY     TRANSFORAMINAL LUMBAR INTERBODY FUSION (TLIF) WITH PEDICLE SCREW FIXATION 2 LEVEL Right 07/26/2019   Procedure: RIGHT-SIDED LUMBAR 5 - SACRUM 1 TRANSFORAMINAL LUMBAR INTERBODY FUSION WITH INSTRUMENTATION AND ALLOGRAFT;  Surgeon: Alyssa Bob MD;  [place] [facility]  Service: Orthopedics;  Laterality: Right;   Family History  Problem Relation Age of  Onset   Diabetes Mother    Diabetes Maternal Grandmother    Hypertension Maternal Grandfather    Breast cancer Neg Hx     Allergies: Influenza vaccine live, Influenza vaccines, Hydrocodone, Morphine and related, and Prednisone Current Outpatient Medications on File Prior to Visit  Medication Sig Dispense Refill   aspirin EC 81 MG EC tablet Take 1 tablet (81 mg total) by mouth daily. 30 tablet 0   Blood Glucose Monitoring Suppl (FREESTYLE FREEDOM LITE) w/Device KIT      glucose blood (FREESTYLE LITE) test strip Used to check blood sugars once a day. 100 each 4   losartan (COZAAR) 25 MG tablet TAKE 1/2 TABLET BY MOUTH 3 TIMES DAILY (Patient taking differently: Take 25 mg by mouth daily.) 75 tablet 1   meclizine (ANTIVERT) 25 MG tablet Take 2 tablets (50 mg total) by mouth 3 (three) times daily as needed for dizziness 60 tablet 1   metFORMIN (GLUCOPHAGE) 500 MG tablet TAKE 1 TABLET BY MOUTH 2 TIMES DAILY WITH MEALS 60 tablet 1   rosuvastatin (CRESTOR) 20 MG tablet TAKE 1 TABLET BY MOUTH DAILY 90 tablet 1   TRUEPLUS LANCETS 30G MISC USE AS DIRECTED 100 each 5   valACYclovir (VALTREX) 500 MG tablet Take 2 tablets (1,000 mg total) by mouth 2 (two) times daily. Take prn cold sore on on day 12 tablet 1   ondansetron (ZOFRAN ODT) 4 MG disintegrating tablet Take 1 tablet (4 mg total) by mouth every  8 (eight) hours as needed for nausea or vomiting. 20 tablet 0   No current facility-administered medications on file prior to visit.    Social History   Tobacco Use   Smoking status: Never   Smokeless tobacco: Never  Vaping Use   Vaping Use: Never used  Substance Use Topics   Alcohol use: Yes    Comment: Occasional wine   Drug use: No    Review of Systems    Objective:    BP 110/64 (BP Location: Right Arm, Patient Position: Sitting, Cuff Size: Normal)   Pulse 76   Temp 98.9 F (37.2 C) (Oral)   Ht 5' 5.5" (1.664 m)   Wt 163 lb 3.2 oz (74 kg)   LMP 07/24/2007 Comment: ablation in  2008-pt reports she went through menopause after this  SpO2 97%   BMI 26.74 kg/m  BP Readings from Last 3 Encounters:  06/23/21 110/64  05/21/21 (!) 93/57  03/21/21 (!) 96/58   Wt Readings from Last 3 Encounters:  06/23/21 163 lb 3.2 oz (74 kg)  05/21/21 161 lb 6.4 oz (73.2 kg)  03/21/21 162 lb (73.5 kg)    Physical Exam     Assessment & Plan:   Problem List Items Addressed This Visit       Endocrine   Diabetes mellitus type 2, controlled (Alyssa Schultz)    Lab Results  Component Value Date   HGBA1C 6.4 (H) 06/11/2021  Chronic, stable.  Continue metformin 500 mg twice daily.  Counseled on importance of following low glycemic diet        Musculoskeletal and Integument   Vaginal itching - Primary    Declines pelvic exam, and she prefers to self swab to screen for BV, candida.  Will await culture including urine      Relevant Orders   Cervicovaginal ancillary only( [place])   Urinalysis, Routine w reflex microscopic   Urine Culture     Other   Hyperlipidemia    Chronic, stable. Continue Crestor 65m.  We will update lipid panel at follow-up to ensure LDL less than 70.      Proteinuria    Markedly worsened.  Proteinuria worsens with patient's A1c is greater than 6.0.  She will continue to focus on diet.  Continue losartan 25 mg daily.  she has upcoming appointment with Dr. LHolley [facility]aringis [facility].  will follow      Other Visit Diagnoses     Itching            I am having LCristopher Peru Behunin maintain her TRUEplus Lancets 30G, aspirin, valACYclovir, FreeStyle Freedom Lite, FREESTYLE LITE, ondansetron, meclizine, losartan, rosuvastatin, and metFORMIN.   No orders of the defined types were placed in this encounter.   Return precautions given.   Risks, benefits, and alternatives of the medications and treatment plan prescribed today were discussed, and patient expressed understanding.   Education regarding symptom management and  diagnosis given to patient on AVS.  Continue to follow with ABurnard Hawthorne FNP for routine health maintenance.   LCephus Richerand I agreed with plan.   MMable Paris FNP

## 2021-06-23 NOTE — Patient Instructions (Signed)
This is  Dr. Tullo's  example of a  "Low GI"  Diet:  It will allow you to lose 4 to 8  lbs  per month if you follow it carefully.  Your goal with exercise is a minimum of 30 minutes of aerobic exercise 5 days per week (Walking does not count once it becomes easy!)    All of the foods can be found at grocery stores and in bulk at [employer].  The Atkins protein bars and shakes are available in more varieties at Target, WalMart and Lowe's Foods.     7 AM Breakfast:  Choose from the following:  Low carbohydrate Protein  Shakes (I recommend the  Premier Protein chocolate shakes,  EAS AdvantEdge "Carb Control" shakes  Or the Atkins shakes all are under 3 net carbs)     a scrambled egg/bacon/cheese burrito made with Mission's "carb balance" whole wheat tortilla  (about 10 net carbs )  Jimmy Deans sells microwaveable frittata (basically a quiche without the pastry crust) that is eaten cold and very convenient way to get your eggs.  8 carbs)  If you make your own protein shakes, avoid bananas and pineapple,  And use low carb greek yogurt or original /unsweetened almond or soy milk    Avoid cereal and bananas, oatmeal and cream of wheat and grits. They are loaded with carbohydrates!   10 AM: high protein snack:  Protein bar by Atkins (the snack size, under 200 cal, usually < 6 net carbs).    A stick of cheese:  Around 1 carb,  100 cal     Dannon Light n Fit Greek Yogurt  (80 cal, 8 carbs)  Other so called "protein bars" and Greek yogurts tend to be loaded with carbohydrates.  Remember, in food advertising, the word "energy" is synonymous for " carbohydrate."  Lunch:   A Sandwich using the bread choices listed, Can use any  Eggs,  lunchmeat, grilled meat or canned tuna), avocado, regular mayo/mustard  and cheese.  A Salad using blue cheese, ranch,  Goddess or vinagrette,  Avoid taco shells, croutons or "confetti" and no "candied nuts" but regular nuts OK.   No pretzels, nabs  or chips.  Pickles and  miniature sweet peppers are a good low carb alternative that provide a "crunch"  The bread is the only source of carbohydrate in a sandwich and  can be decreased by trying some of the attached alternatives to traditional loaf bread   Avoid "Low fat dressings, as well as [place] and [place] dressings They are loaded with sugar!   3 PM/ Mid day  Snack:  Consider  1 ounce of  almonds, walnuts, pistachios, pecans, peanuts,  Macadamia nuts or a nut medley.  Avoid "granola and granola bars "  Mixed nuts are ok in moderation as long as there are no raisins,  cranberries or dried fruit.   KIND bars are OK if you get the low glycemic index variety   Try the prosciutto/mozzarella cheese sticks by Fiorruci  In deli /backery section   High protein      6 PM  Dinner:     Meat/fowl/fish with a green salad, and either broccoli, cauliflower, green beans, spinach, brussel sprouts or  Lima beans. DO NOT BREAD THE PROTEIN!!      There is a low carb pasta by Dreamfield's that is acceptable and tastes great: only 5 digestible carbs/serving.( All grocery stores but BJs carry it ) Several ready made meals are   available low carb:   Try Michel Angelo's chicken piccata or chicken or eggplant parm over low carb pasta.(Lowes and BJs)   Aaron Sanchez's "Carnitas" (pulled pork, no sauce,  0 carbs) or his beef pot roast to make a dinner burrito (at BJ's)  Pesto over low carb pasta (bj's sells a good quality pesto in the center refrigerated section of the deli   Try satueeing  Bok Choy with mushroooms as a good side   Green Giant makes a mashed cauliflower that tastes like mashed potatoes  Whole wheat pasta is still full of digestible carbs and  Not as low in glycemic index as Dreamfield's.   Brown rice is still rice,  So skip the rice and noodles if you eat Chinese or Thai (or at least limit to 1/2 cup)  9 PM snack :   Breyer's "low carb" fudgsicle or  ice cream bar (Carb Smart line), or  Weight Watcher's ice  cream bar , or another "no sugar added" ice cream;  a serving of fresh berries/cherries with whipped cream   Cheese or DANNON'S LlGHT N FIT GREEK YOGURT  8 ounces of [place] unsweetened almond/cococunut milk    Treat yourself to a parfait made with whipped cream blueberiies, walnuts and vanilla greek yogurt  Avoid bananas, pineapple, grapes  and watermelon on a regular basis because they are high in sugar.  THINK OF THEM AS DESSERT  Remember that snack Substitutions should be less than 10 NET carbs per serving and meals < 20 carbs. Remember to subtract fiber grams to get the "net carbs."   

## 2021-06-23 NOTE — Assessment & Plan Note (Signed)
Chronic, stable. Continue Crestor 20mg .  We will update lipid panel at follow-up to ensure LDL less than 70.

## 2021-06-23 NOTE — Assessment & Plan Note (Signed)
Markedly worsened.  Proteinuria worsens with patient's A1c is greater than 6.0.  She will continue to focus on diet.  Continue losartan 25 mg daily.  she has upcoming appointment with Dr. Holley Raring is seeking second opinion at [facility] nephrology as well.  will follow

## 2021-06-23 NOTE — Assessment & Plan Note (Signed)
Declines pelvic exam, and she prefers to self swab to screen for BV, candida.  Will await culture including urine

## 2021-06-24 ENCOUNTER — Encounter: Payer: Self-pay | Admitting: Family

## 2021-06-24 LAB — URINALYSIS, ROUTINE W REFLEX MICROSCOPIC
Bilirubin Urine: NEGATIVE
Hgb urine dipstick: NEGATIVE
Ketones, ur: NEGATIVE
Leukocytes,Ua: NEGATIVE
Nitrite: NEGATIVE
RBC / HPF: NONE SEEN (ref 0–?)
Specific Gravity, Urine: 1.02 (ref 1.000–1.030)
Total Protein, Urine: NEGATIVE
Urine Glucose: NEGATIVE
Urobilinogen, UA: 0.2 (ref 0.0–1.0)
WBC, UA: NONE SEEN (ref 0–?)
pH: 6 (ref 5.0–8.0)

## 2021-06-24 LAB — URINE CULTURE
MICRO NUMBER:: 12602735
Result:: NO GROWTH
SPECIMEN QUALITY:: ADEQUATE

## 2021-06-25 ENCOUNTER — Encounter: Payer: Self-pay | Admitting: Family

## 2021-06-25 ENCOUNTER — Other Ambulatory Visit: Payer: Self-pay | Admitting: Family

## 2021-06-25 ENCOUNTER — Other Ambulatory Visit: Payer: Self-pay

## 2021-06-25 DIAGNOSIS — B9689 Other specified bacterial agents as the cause of diseases classified elsewhere: Secondary | ICD-10-CM

## 2021-06-25 DIAGNOSIS — N760 Acute vaginitis: Secondary | ICD-10-CM

## 2021-06-25 LAB — CERVICOVAGINAL ANCILLARY ONLY
Bacterial Vaginitis (gardnerella): POSITIVE — AB
Candida Glabrata: NEGATIVE
Candida Vaginitis: NEGATIVE
Comment: NEGATIVE
Comment: NEGATIVE
Comment: NEGATIVE
Comment: NEGATIVE
Trichomonas: NEGATIVE

## 2021-06-25 MED ORDER — METRONIDAZOLE 500 MG PO TABS
500.0000 mg | ORAL_TABLET | Freq: Two times a day (BID) | ORAL | 0 refills | Status: DC
  Filled 2021-06-25: qty 14, 7d supply, fill #0

## 2021-06-27 ENCOUNTER — Encounter: Payer: Self-pay | Admitting: Family

## 2021-07-01 ENCOUNTER — Encounter: Payer: Self-pay | Admitting: Family

## 2021-07-01 DIAGNOSIS — E7800 Pure hypercholesterolemia, unspecified: Secondary | ICD-10-CM | POA: Diagnosis not present

## 2021-07-01 DIAGNOSIS — R809 Proteinuria, unspecified: Secondary | ICD-10-CM | POA: Diagnosis not present

## 2021-07-01 DIAGNOSIS — E1129 Type 2 diabetes mellitus with other diabetic kidney complication: Secondary | ICD-10-CM | POA: Diagnosis not present

## 2021-07-08 ENCOUNTER — Encounter: Payer: Self-pay | Admitting: Family

## 2021-07-14 ENCOUNTER — Other Ambulatory Visit: Payer: Self-pay

## 2021-07-14 ENCOUNTER — Other Ambulatory Visit: Payer: Self-pay | Admitting: Family

## 2021-07-14 DIAGNOSIS — E119 Type 2 diabetes mellitus without complications: Secondary | ICD-10-CM

## 2021-07-14 MED ORDER — OZEMPIC (0.25 OR 0.5 MG/DOSE) 2 MG/1.5ML ~~LOC~~ SOPN
0.2500 mg | PEN_INJECTOR | SUBCUTANEOUS | 3 refills | Status: DC
  Filled 2021-07-14: qty 3, 56d supply, fill #0
  Filled 2021-09-03 (×2): qty 3, 56d supply, fill #1

## 2021-07-15 ENCOUNTER — Other Ambulatory Visit (HOSPITAL_COMMUNITY): Payer: Self-pay

## 2021-07-16 ENCOUNTER — Encounter: Payer: Self-pay | Admitting: Family

## 2021-07-17 ENCOUNTER — Encounter: Payer: Self-pay | Admitting: Gastroenterology

## 2021-07-18 ENCOUNTER — Other Ambulatory Visit: Payer: Self-pay

## 2021-07-18 ENCOUNTER — Encounter
Admission: RE | Disposition: A | Discharge status: Home / Self Care | Payer: Self-pay | Source: Home / Self Care | Attending: Gastroenterology

## 2021-07-18 ENCOUNTER — Encounter: Payer: Self-pay | Admitting: Gastroenterology

## 2021-07-18 ENCOUNTER — Ambulatory Visit: Payer: 59 | Admitting: Anesthesiology

## 2021-07-18 ENCOUNTER — Ambulatory Visit
Admission: RE | Admit: 2021-07-18 | Discharge: 2021-07-18 | Disposition: A | Discharge status: Home / Self Care | Payer: 59 | Attending: Gastroenterology | Admitting: Gastroenterology

## 2021-07-18 DIAGNOSIS — Z79899 Other long term (current) drug therapy: Secondary | ICD-10-CM | POA: Diagnosis not present

## 2021-07-18 DIAGNOSIS — D122 Benign neoplasm of ascending colon: Secondary | ICD-10-CM | POA: Diagnosis not present

## 2021-07-18 DIAGNOSIS — I10 Essential (primary) hypertension: Secondary | ICD-10-CM | POA: Diagnosis not present

## 2021-07-18 DIAGNOSIS — Z1211 Encounter for screening for malignant neoplasm of colon: Secondary | ICD-10-CM | POA: Diagnosis not present

## 2021-07-18 DIAGNOSIS — R195 Other fecal abnormalities: Secondary | ICD-10-CM

## 2021-07-18 DIAGNOSIS — K635 Polyp of colon: Secondary | ICD-10-CM | POA: Diagnosis not present

## 2021-07-18 DIAGNOSIS — K648 Other hemorrhoids: Secondary | ICD-10-CM | POA: Insufficient documentation

## 2021-07-18 DIAGNOSIS — E785 Hyperlipidemia, unspecified: Secondary | ICD-10-CM | POA: Diagnosis not present

## 2021-07-18 DIAGNOSIS — Z8673 Personal history of transient ischemic attack (TIA), and cerebral infarction without residual deficits: Secondary | ICD-10-CM | POA: Diagnosis not present

## 2021-07-18 DIAGNOSIS — E119 Type 2 diabetes mellitus without complications: Secondary | ICD-10-CM | POA: Diagnosis not present

## 2021-07-18 DIAGNOSIS — K6389 Other specified diseases of intestine: Secondary | ICD-10-CM | POA: Insufficient documentation

## 2021-07-18 HISTORY — PX: COLONOSCOPY WITH PROPOFOL: SHX5780

## 2021-07-18 LAB — GLUCOSE, CAPILLARY: Glucose-Capillary: 96 mg/dL (ref 70–99)

## 2021-07-18 SURGERY — COLONOSCOPY WITH PROPOFOL
Anesthesia: General

## 2021-07-18 MED ORDER — PROPOFOL 10 MG/ML IV BOLUS
INTRAVENOUS | Status: DC | PRN
  Administered 2021-07-18: 70 mg via INTRAVENOUS

## 2021-07-18 MED ORDER — SODIUM CHLORIDE 0.9 % IV SOLN: INTRAVENOUS | Status: DC

## 2021-07-18 MED ORDER — PROPOFOL 500 MG/50ML IV EMUL
INTRAVENOUS | Status: DC | PRN
  Administered 2021-07-18: 150 ug/kg/min via INTRAVENOUS

## 2021-07-18 MED ORDER — LIDOCAINE HCL (PF) 2 % IJ SOLN
INTRAMUSCULAR | Status: AC
  Filled 2021-07-18: qty 5

## 2021-07-18 MED ORDER — LIDOCAINE HCL (CARDIAC) PF 100 MG/5ML IV SOSY
PREFILLED_SYRINGE | INTRAVENOUS | Status: DC | PRN
  Administered 2021-07-18: 60 mg via INTRAVENOUS

## 2021-07-18 MED ORDER — PROPOFOL 500 MG/50ML IV EMUL
INTRAVENOUS | Status: AC
  Filled 2021-07-18: qty 50

## 2021-07-18 NOTE — Anesthesia Preprocedure Evaluation (Signed)
Anesthesia Evaluation  Patient identified by MRN, date of birth, ID band Patient awake    Reviewed: Allergy & Precautions, NPO status , Patient's Chart, lab work & pertinent test results  History of Anesthesia Complications (+) PROLONGED EMERGENCE  Airway Mallampati: II  TM Distance: >3 FB Neck ROM: Full    Dental  (+) Chipped Braces:   Pulmonary neg pulmonary ROS,    Pulmonary exam normal breath sounds clear to auscultation       Cardiovascular hypertension, Pt. on medications + angina negative cardio ROS Normal cardiovascular exam Rhythm:Regular     Neuro/Psych TIAnegative neurological ROS  negative psych ROS   GI/Hepatic negative GI ROS, Neg liver ROS,   Endo/Other  negative endocrine ROSdiabetes, Well Controlled  Renal/GU negative Renal ROS  negative genitourinary   Musculoskeletal negative musculoskeletal ROS (+)   Abdominal Normal abdominal exam  (+)   Peds negative pediatric ROS (+)  Hematology negative hematology ROS (+)   Anesthesia Other Findings Past Medical History: No date: Complication of anesthesia     Comment:  hard to wake in 2017, patient reports she is 30 lb               lighter now No date: Diabetes mellitus     Comment:  dx 2014   just take pills No date: Hyperlipidemia No date: Hypertension No date: Neuromuscular disorder [facility])  Past Surgical History: No date: abalsion No date: ablation 09/19/2015: ANTERIOR CERVICAL DECOMP/DISCECTOMY FUSION; N/A     Comment:  Procedure: ANTERIOR CERVICAL DECOMPRESSION/DISCECTOMY               FUSION 2 LEVELS;  Surgeon: Phylliss Bob, MD;  Location:               [place];  Service: Orthopedics;  Laterality: N/A;  Anterior              cervical decompression fusion, cervical 3-4, cervical 4-5              decompression with instrumentation and allograft No date: BACK SURGERY No date: FACIAL RECONSTRUCTION SURGERY 07/26/2019: TRANSFORAMINAL LUMBAR  INTERBODY FUSION (TLIF) WITH PEDICLE  SCREW FIXATION 2 LEVEL; Right     Comment:  Procedure: RIGHT-SIDED LUMBAR 5 - SACRUM 1               TRANSFORAMINAL LUMBAR INTERBODY FUSION WITH               INSTRUMENTATION AND ALLOGRAFT;  Surgeon: Phylliss Bob,               MD;  Location: [place];  Service: Orthopedics;  Laterality:              Right;  BMI    Body Mass Index: 25.68 kg/m      Reproductive/Obstetrics negative OB ROS                             Anesthesia Physical Anesthesia Plan  ASA: 3  Anesthesia Plan: General   Post-op Pain Management:    Induction: Intravenous  PONV Risk Score and Plan: Propofol infusion and TIVA  Airway Management Planned: Natural Airway and Nasal Cannula  Additional Equipment:   Intra-op Plan:   Post-operative Plan:   Informed Consent: I have reviewed the patients History and Physical, chart, labs and discussed the procedure including the risks, benefits and alternatives for the proposed anesthesia with the patient or authorized representative who has indicated his/her  understanding and acceptance.     Dental Advisory Given  Plan Discussed with: CRNA and Surgeon  Anesthesia Plan Comments:         Anesthesia Quick Evaluation

## 2021-07-18 NOTE — Transfer of Care (Signed)
Immediate Anesthesia Transfer of Care Note  Patient: Alyssa Schultz  Procedure(s) Performed: COLONOSCOPY WITH PROPOFOL  Patient Location: Endoscopy Unit  Anesthesia Type:General  Level of Consciousness: awake, alert  and oriented  Airway & Oxygen Therapy: Patient Spontanous Breathing  Post-op Assessment: Report given to RN and Post -op Vital signs reviewed and stable  Post vital signs: Reviewed and stable  Last Vitals:  Vitals Value Taken Time  BP 101/70 07/18/21 0936  Temp    Pulse 67 07/18/21 0936  Resp 15 07/18/21 0936  SpO2 100 % 07/18/21 0936  Vitals shown include unvalidated device data.  Last Pain:  Vitals:   07/18/21 0936  TempSrc:   PainSc: 0-No pain         Complications: No notable events documented.

## 2021-07-18 NOTE — H&P (Signed)
Vonda Antigua, MD [address], [place], [place], [place], [address] [address], [place], [place], [place], [address] Phone: [phone]  Fax: [phone]  Primary Care Physician:  Burnard Hawthorne, FNP   Pre-Procedure History & Physical: HPI:  Alyssa Schultz is a 55 y.o. female is here for a colonoscopy.   Past Medical History:  Diagnosis Date   Complication of anesthesia    hard to wake in 2017, patient reports she is 30 lb lighter now   Diabetes mellitus    dx 2014   just take pills   Hyperlipidemia    Hypertension    Neuromuscular disorder [facility])     Past Surgical History:  Procedure Laterality Date   abalsion     ablation     ANTERIOR CERVICAL DECOMP/DISCECTOMY FUSION N/A 09/19/2015   Procedure: ANTERIOR CERVICAL DECOMPRESSION/DISCECTOMY FUSION 2 LEVELS;  Surgeon: Phylliss Bob, MD;  Location: [place];  Service: Orthopedics;  Laterality: N/A;  Anterior cervical decompression fusion, cervical 3-4, cervical 4-5 decompression with instrumentation and allograft   BACK SURGERY     FACIAL RECONSTRUCTION SURGERY     TRANSFORAMINAL LUMBAR INTERBODY FUSION (TLIF) WITH PEDICLE SCREW FIXATION 2 LEVEL Right 07/26/2019   Procedure: RIGHT-SIDED LUMBAR 5 - SACRUM 1 TRANSFORAMINAL LUMBAR INTERBODY FUSION WITH INSTRUMENTATION AND ALLOGRAFT;  Surgeon: Phylliss Bob, MD;  Location: [place];  Service: Orthopedics;  Laterality: Right;    Prior to Admission medications   Medication Sig Start Date End Date Taking? Authorizing Provider  aspirin EC 81 MG EC tablet Take 1 tablet (81 mg total) by mouth daily. 09/06/19   Thornell Mule, MD  Blood Glucose Monitoring Suppl (FREESTYLE FREEDOM LITE) w/Device KIT  10/17/19   [provider]  glucose blood (FREESTYLE LITE) test strip Used to check blood sugars once a day. 01/09/21   Burnard Hawthorne, FNP  losartan (COZAAR) 25 MG tablet TAKE 1/2 TABLET BY MOUTH 3 TIMES DAILY Patient taking differently: Take 25 mg by mouth daily. 05/07/21  05/07/22  Burnard Hawthorne, FNP  meclizine (ANTIVERT) 25 MG tablet Take 2 tablets (50 mg total) by mouth 3 (three) times daily as needed for dizziness 03/21/21   Burnard Hawthorne, FNP  metroNIDAZOLE (FLAGYL) 500 MG tablet Take 1 tablet (500 mg total) by mouth 2 (two) times daily. 06/25/21   Burnard Hawthorne, FNP  ondansetron (ZOFRAN ODT) 4 MG disintegrating tablet Take 1 tablet (4 mg total) by mouth every 8 (eight) hours as needed for nausea or vomiting. 02/18/21   Coral Spikes, DO  rosuvastatin (CRESTOR) 20 MG tablet TAKE 1 TABLET BY MOUTH DAILY 05/15/21 05/15/22  Burnard Hawthorne, FNP  Semaglutide,0.25 or 0.5MG/DOS, (OZEMPIC, 0.25 OR 0.5 MG/DOSE,) 2 MG/1.5ML SOPN Inject 0.25 mg into the skin once a week. After 4 weeks, increase to 0.6m [place] qwk. 07/14/21   ABurnard Hawthorne FNP  TRUEPLUS LANCETS 30G MISC USE AS DIRECTED 03/06/16   WJackolyn Confer MD  valACYclovir (VALTREX) 500 MG tablet Take 2 tablets (1,000 mg total) by mouth 2 (two) times daily. Take prn cold sore on on day 10/09/19   JJuline Patch MD    Allergies as of 05/22/2021 - Review Complete 05/21/2021  Allergen Reaction Noted   Influenza vaccine live  05/21/2011   Influenza vaccines  05/21/2011   Hydrocodone Rash 09/06/2015   Morphine and related Other (See Comments) 09/06/2015   Prednisone  09/19/2015    Family History  Problem Relation Age of Onset   Diabetes Mother  Diabetes Maternal Grandmother    Hypertension Maternal Grandfather    Breast cancer Neg Hx     Social History   Socioeconomic History   Marital status: Single    Spouse name: Not on file   Number of children: Not on file   Years of education: Not on file   Highest education level: Not on file  Occupational History   Not on file  Tobacco Use   Smoking status: Never   Smokeless tobacco: Never  Vaping Use   Vaping Use: Never used  Substance and Sexual Activity   Alcohol use: Yes    Comment: Occasional wine   Drug use: No   Sexual  activity: Yes  Other Topics Concern   Not on file  Social History Narrative   Works for [employer], 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[employer]; doing PT for cancer center            Social Determinants of Health   Financial Resource Strain: Not on file  Food Insecurity: Not on file  Transportation Needs: Not on file  Physical Activity: Not on file  Stress: Not on file  Social Connections: Not on file  Intimate Partner Violence: Not on file    Review of Systems: See HPI, otherwise negative ROS  Physical Exam: Constitutional: General:   Alert,  Well-developed, well-nourished, pleasant and cooperative in NAD BP 120/80   Pulse 66   Temp 97.8 F (36.6 C) (Temporal)   Resp 15   Ht 5' 5.5" (1.664 m)   Wt 71.1 kg   LMP 07/24/2007 Comment: ablation in 2008-pt reports she went through menopause after this  SpO2 100%   BMI 25.68 kg/m   Head: Normocephalic, atraumatic.   Eyes:  Sclera clear, no icterus.   Conjunctiva pink.   Mouth:  No deformity or lesions, oropharynx pink & moist.  Neck:  Supple, trachea midline  Respiratory: Normal respiratory effort  Gastrointestinal:  Soft, non-tender and non-distended without masses, hepatosplenomegaly or hernias noted.  No guarding or rebound tenderness.     Cardiac: No clubbing or edema.  No cyanosis. Normal posterior tibial pedal pulses noted.  Lymphatic:  No significant cervical adenopathy.  Psych:  Alert and cooperative. Normal mood and affect.  Musculoskeletal:   Symmetrical without gross deformities. 5/5 Lower extremity strength bilaterally.  Skin: Warm. Intact without significant lesions or rashes. No jaundice.  Neurologic:  Face symmetrical, tongue midline, Normal sensation to touch;  grossly normal neurologically.  Psych:  Alert and oriented x3, Alert and cooperative. Normal mood and affect.  Impression/Plan: Alyssa Schultz is here for a colonoscopy to be performed for positive  cologuard.  Risks, benefits, limitations, and alternatives regarding  colonoscopy have been reviewed with the patient.  Questions have been answered.  All parties agreeable.   Virgel Manifold, MD  07/18/2021, 8:04 AM

## 2021-07-18 NOTE — Anesthesia Postprocedure Evaluation (Signed)
Anesthesia Post Note  Patient: Alyssa Schultz  Procedure(s) Performed: COLONOSCOPY WITH PROPOFOL  Patient location during evaluation: PACU Anesthesia Type: General Level of consciousness: awake and awake and alert Pain management: pain level controlled Vital Signs Assessment: post-procedure vital signs reviewed and stable Respiratory status: spontaneous breathing and nonlabored ventilation Cardiovascular status: stable Anesthetic complications: no   No notable events documented.   Last Vitals:  Vitals:   07/18/21 0946 07/18/21 0956  BP: 107/76 112/75  Pulse: 64 (!) 56  Resp: 16 15  Temp: (!) 35.6 C   SpO2: 100% 100%    Last Pain:  Vitals:   07/18/21 0956  TempSrc:   PainSc: 0-No pain                 VAN STAVEREN,Tnya Ades

## 2021-07-18 NOTE — Op Note (Signed)
[facility] Gastroenterology Patient Name: Alyssa Schultz Procedure Date: 07/18/2021 9:02 AM MRN: [id number] Account #: [id number] Date of Birth: 1966-07-12 Admit Type: Outpatient Age: 55 Room: [facility] ENDO ROOM 2 Gender: Female Note Status: Finalized Instrument Name: Jasper Riling [id number] Procedure:             Colonoscopy Indications:           Positive Cologuard test Providers:             Akasia Ahmad B. Bonna Gains MD, MD Referring MD:          Yvetta Coder. Arnett (Referring MD) Medicines:             Monitored Anesthesia Care Complications:         No immediate complications. Procedure:             Pre-Anesthesia Assessment:                        - ASA Grade Assessment: II - A patient with mild                         systemic disease.                        - Prior to the procedure, a History and Physical was                         performed, and patient medications, allergies and                         sensitivities were reviewed. The patient's tolerance                         of previous anesthesia was reviewed.                        - The risks and benefits of the procedure and the                         sedation options and risks were discussed with the                         patient. All questions were answered and informed                         consent was obtained.                        - Patient identification and proposed procedure were                         verified prior to the procedure by the physician, the                         nurse, the anesthesiologist, the anesthetist and the                         technician. The procedure was verified in the  procedure room.                        After obtaining informed consent, the colonoscope was                         passed under direct vision. Throughout the procedure,                         the patient's blood pressure, pulse, and oxygen                          saturations were monitored continuously. The                         Colonoscope was introduced through the anus and                         advanced to the the cecum, identified by appendiceal                         orifice and ileocecal valve. The colonoscopy was                         performed with ease. The patient tolerated the                         procedure well. The quality of the bowel preparation                         was fair. Vegetable matter was present in certain                         areas of the colon. This was cleaned as much as                         possible with water and suction Findings:      The perianal and digital rectal examinations were normal.      A 5 mm polyp was found in the ascending colon. The polyp was sessile.       The polyp was removed with a jumbo cold forceps. Resection and retrieval       were complete.      A 4 mm polyp was found in the descending colon. The polyp was sessile.       The polyp was removed with a jumbo cold forceps. Resection and retrieval       were complete.      The exam was otherwise without abnormality.      The rectum, sigmoid colon, descending colon, transverse colon, ascending       colon and cecum appeared normal.      Non-bleeding internal hemorrhoids were found during retroflexion.      No additional abnormalities were found on retroflexion. Impression:            - Preparation of the colon was fair.                        - One 5 mm polyp in the ascending colon, removed with  a jumbo cold forceps. Resected and retrieved.                        - One 4 mm polyp in the descending colon, removed with                         a jumbo cold forceps. Resected and retrieved.                        - The examination was otherwise normal.                        - The rectum, sigmoid colon, descending colon,                         transverse colon, ascending colon and cecum are normal.                         - Non-bleeding internal hemorrhoids. Recommendation:        - Await pathology results.                        - Discharge patient to home (with escort).                        - High fiber diet.                        - Continue present medications.                        - Repeat colonoscopy date to be determined after                         pending pathology results are reviewed.                        - The findings and recommendations were discussed with                         the patient.                        - The findings and recommendations were discussed with                         the patient's family.                        - Return to primary care physician as previously                         scheduled. Procedure Code(s):     --- Professional ---                        [phone], Colonoscopy, flexible; with biopsy, single or                         multiple Diagnosis Code(s):     --- Professional ---  K63.5, Polyp of colon                        R19.5, Other fecal abnormalities CPT copyright 2019 American Medical Association. All rights reserved. The codes documented in this report are preliminary and upon coder review may  be revised to meet current compliance requirements.  Vonda Antigua, MD Margretta Sidle B. Bonna Gains MD, MD 07/18/2021 9:44:36 AM This report has been signed electronically. Number of Addenda: 0 Note Initiated On: 07/18/2021 9:02 AM Scope Withdrawal Time: 0 hours 20 minutes 10 seconds  Total Procedure Duration: 0 hours 23 minutes 47 seconds  Estimated Blood Loss:  Estimated blood loss: none.      [facility]

## 2021-07-19 ENCOUNTER — Encounter: Payer: Self-pay | Admitting: Family

## 2021-07-21 ENCOUNTER — Encounter: Payer: Self-pay | Admitting: Gastroenterology

## 2021-07-21 ENCOUNTER — Other Ambulatory Visit: Payer: Self-pay | Admitting: Family

## 2021-07-21 DIAGNOSIS — I10 Essential (primary) hypertension: Secondary | ICD-10-CM

## 2021-07-22 LAB — SURGICAL PATHOLOGY

## 2021-07-24 ENCOUNTER — Encounter: Payer: Self-pay | Admitting: Gastroenterology

## 2021-07-25 ENCOUNTER — Encounter: Payer: Self-pay | Admitting: Gastroenterology

## 2021-07-30 ENCOUNTER — Encounter: Payer: Self-pay | Admitting: Family

## 2021-07-31 ENCOUNTER — Ambulatory Visit: Payer: 59 | Admitting: Gastroenterology

## 2021-07-31 DIAGNOSIS — Z79899 Other long term (current) drug therapy: Secondary | ICD-10-CM | POA: Diagnosis not present

## 2021-07-31 DIAGNOSIS — R809 Proteinuria, unspecified: Secondary | ICD-10-CM | POA: Diagnosis not present

## 2021-07-31 DIAGNOSIS — E669 Obesity, unspecified: Secondary | ICD-10-CM | POA: Diagnosis not present

## 2021-07-31 DIAGNOSIS — Z8673 Personal history of transient ischemic attack (TIA), and cerebral infarction without residual deficits: Secondary | ICD-10-CM | POA: Diagnosis not present

## 2021-07-31 DIAGNOSIS — Z7982 Long term (current) use of aspirin: Secondary | ICD-10-CM | POA: Diagnosis not present

## 2021-07-31 DIAGNOSIS — I10 Essential (primary) hypertension: Secondary | ICD-10-CM | POA: Diagnosis not present

## 2021-07-31 DIAGNOSIS — E119 Type 2 diabetes mellitus without complications: Secondary | ICD-10-CM | POA: Diagnosis not present

## 2021-08-01 ENCOUNTER — Telehealth (INDEPENDENT_AMBULATORY_CARE_PROVIDER_SITE_OTHER): Payer: 59 | Admitting: Gastroenterology

## 2021-08-01 ENCOUNTER — Encounter: Payer: Self-pay | Admitting: Gastroenterology

## 2021-08-01 DIAGNOSIS — D126 Benign neoplasm of colon, unspecified: Secondary | ICD-10-CM

## 2021-08-01 NOTE — Progress Notes (Signed)
Sherri Sear, MD [address]  [place]  [place], [place] [address]  Main: [phone]  Fax: [phone]    Gastroenterology Consultation Video Visit  Referring Provider:     Burnard Hawthorne, FNP Primary Care Physician:  Burnard Hawthorne, FNP Primary Gastroenterologist:  Dr. Bonna Gains Reason for Consultation:     Follow-up colonoscopy        HPI:   Alyssa Schultz is a 55 y.o. female referred by Dr. Vidal Schwalbe, Yvetta Coder, [place]  for consultation & management of history of colon adenomas  Virtual Visit Video Note  I connected with Alyssa Schultz on 08/01/21 at 10:45 AM EST by video and verified that I am speaking with the correct person using two identifiers.   I discussed the limitations, risks, security and privacy concerns of performing an evaluation and management service by video and the availability of in person appointments. I also discussed with the patient that there may be a patient responsible charge related to this service. The patient expressed understanding and agreed to proceed.  Location of the Patient: Home  Location of the provider: Office  Persons participating in the visit: Patient and provider only   History of Present Illness: Mr. Rebeca Alert recently underwent colonoscopy for positive Cologuard test.  She was found to have 2 subcentimeter polyps that were tubular adenomas only.  She had a fair prep.  She has history of diabetes, discussed switched from metformin to Ozempic once a week.  Patient made a decision to discontinue metformin because of fluctuations in her renal function.  She denies any GI symptoms today.  Based on her colonoscopy report, patient was recommended to undergo repeat colonoscopy in 6 months with 2-day prep.  She received a letter about it and got concerned that she would not be able to tolerate 2-day prep because of her hypoglycemia episodes and she is worried about low blood sugars during the process of staying on liquid diet for 2  days and doing prep for 2 days.    NSAIDs: None  Antiplts/Anticoagulants/Anti thrombotics: None  GI Procedures:  Colonoscopy 07/18/2021 - Preparation of the colon was fair. - One 5 mm polyp in the ascending colon, removed with a jumbo cold forceps. Resected and retrieved. - One 4 mm polyp in the descending colon, removed with a jumbo cold forceps. Resected and retrieved. - The examination was otherwise normal. -Nonbleeding internal hemorrhoids  Past Medical History:  Diagnosis Date   Complication of anesthesia    hard to wake in 2017, patient reports she is 30 lb lighter now   Diabetes mellitus    dx 2014   just take pills   Hyperlipidemia    Hypertension    Neuromuscular disorder [facility])     Past Surgical History:  Procedure Laterality Date   abalsion     ablation     ANTERIOR CERVICAL DECOMP/DISCECTOMY FUSION N/A 09/19/2015   Procedure: ANTERIOR CERVICAL DECOMPRESSION/DISCECTOMY FUSION 2 LEVELS;  Surgeon: Phylliss Bob, MD;  Location: [facility];  Service: Orthopedics;  Laterality: N/A;  Anterior cervical decompression fusion, cervical 3-4, cervical 4-5 decompression with instrumentation and allograft   BACK SURGERY     COLONOSCOPY WITH PROPOFOL N/A 07/18/2021   Procedure: COLONOSCOPY WITH PROPOFOL;  Surgeon: Virgel Manifold, MD;  Location: [facility] END[facility];  Service: Endoscopy;  Laterality: N/A;   FACIAL RECONSTRUCTION SURGERY     TRANSFORAMINAL LUMBAR INTERBODY FUSION (TLIF) WITH PEDICLE SCREW FIXATION 2 LEVEL Right 07/26/2019   Procedure: RIGHT-SIDED LUMBAR 5 -  SACRUM 1 TRANSFORAMINAL LUMBAR INTERBODY FUSION WITH INSTRUMENTATION AND ALLOGRAFT;  Surgeon: Phylliss Bob, MD;  Location: [facility];  Service: Orthopedics;  Laterality: Right;   Current Outpatient Medications:    aspirin EC 81 MG EC tablet, Take 1 tablet (81 mg total) by mouth daily., Disp: 30 tablet, Rfl: 0   Blood Glucose Monitoring Suppl (FREESTYLE FREEDOM LITE) w/Device KIT, , Disp: , Rfl:    glucose blood (FREESTYLE  LITE) test strip, Used to check blood sugars once a day., Disp: 100 each, Rfl: 4   losartan (COZAAR) 25 MG tablet, TAKE 1/2 TABLET BY MOUTH 3 TIMES DAILY (Patient taking differently: Take 25 mg by mouth daily.), Disp: 75 tablet, Rfl: 1   meclizine (ANTIVERT) 25 MG tablet, Take 2 tablets (50 mg total) by mouth 3 (three) times daily as needed for dizziness, Disp: 60 tablet, Rfl: 1   ondansetron (ZOFRAN ODT) 4 MG disintegrating tablet, Take 1 tablet (4 mg total) by mouth every 8 (eight) hours as needed for nausea or vomiting., Disp: 20 tablet, Rfl: 0   rosuvastatin (CRESTOR) 20 MG tablet, TAKE 1 TABLET BY MOUTH DAILY, Disp: 90 tablet, Rfl: 1   Semaglutide,0.25 or 0.5MG/DOS, (OZEMPIC, 0.25 OR 0.5 MG/DOSE,) 2 MG/1.5ML SOPN, Inject 0.25 mg into the skin once a week. After 4 weeks, increase to 0.56m [place] qwk., Disp: 3 mL, Rfl: 3   TRUEPLUS LANCETS 30G MISC, USE AS DIRECTED, Disp: 100 each, Rfl: 5   valACYclovir (VALTREX) 500 MG tablet, Take 2 tablets (1,000 mg total) by mouth 2 (two) times daily. Take prn cold sore on on day, Disp: 12 tablet, Rfl: 1   metroNIDAZOLE (FLAGYL) 500 MG tablet, Take 1 tablet (500 mg total) by mouth 2 (two) times daily. (Patient not taking: Reported on 07/18/2021), Disp: 14 tablet, Rfl: 0  Family History  Problem Relation Age of Onset   Diabetes Mother    Diabetes Maternal Grandmother    Hypertension Maternal Grandfather    Breast cancer Neg Hx      Social History   Tobacco Use   Smoking status: Never   Smokeless tobacco: Never  Vaping Use   Vaping Use: Never used  Substance Use Topics   Alcohol use: Yes    Comment: Occasional wine   Drug use: No    Allergies as of 08/01/2021 - Review Complete 08/01/2021  Allergen Reaction Noted   Influenza vaccine live  05/21/2011   Influenza vaccines Other (See Comments) 05/21/2011   Hydrocodone Rash 09/06/2015   Morphine and related Other (See Comments) 09/06/2015   Prednisone  09/19/2015     Imaging Studies: No abdominal  imaging  Assessment and Plan:   Alyssa VIVEROSis a 55y.o. pleasant African-American female with history of diabetes, hemoglobin A1c 6.5 and she switched to Ozempic is seen in for follow-up of colonoscopy.  Patient underwent colonoscopy on 07/18/2021 positive Cologuard test.  She was found to have 2 subcentimeter polyps which were tubular adenomas and prep was inadequate.  Therefore, patient will need colonoscopy in 6 months with 2-day prep.  I have discussed with patient that she can have half the amount of MiraLAX prep and full liquid diet 2 days before the colonoscopy, clear liquid diet and standard bowel prep the day before colonoscopy for Cologuard positive.  Advised her to monitor her blood sugar 3 times a day during the process of bowel prep.  Also, reassured her that her diabetes is fairly under control and not to worry about her blood sugar levels  Patient expressed understanding of the plan and recommendations   Follow Up Instructions:   I discussed the assessment and treatment plan with the patient. The patient was provided an opportunity to ask questions and all were answered. The patient agreed with the plan and demonstrated an understanding of the instructions.   The patient was advised to call back or seek an in-person evaluation if the symptoms worsen or if the condition fails to improve as anticipated.  I provided 15 minutes of face-to-face time during this encounter.   Follow up at the time of colonoscopy in 6 months   Cephas Darby, MD

## 2021-08-12 ENCOUNTER — Encounter: Payer: Self-pay | Admitting: Family

## 2021-08-19 ENCOUNTER — Encounter: Payer: Self-pay | Admitting: Family

## 2021-08-20 ENCOUNTER — Other Ambulatory Visit: Payer: Self-pay | Admitting: Family

## 2021-08-20 ENCOUNTER — Other Ambulatory Visit: Payer: Self-pay

## 2021-08-20 DIAGNOSIS — T50905S Adverse effect of unspecified drugs, medicaments and biological substances, sequela: Secondary | ICD-10-CM

## 2021-08-20 MED ORDER — ONDANSETRON 4 MG PO TBDP
4.0000 mg | ORAL_TABLET | Freq: Three times a day (TID) | ORAL | 0 refills | Status: AC | PRN
  Filled 2021-08-20: qty 30, 10d supply, fill #0

## 2021-09-01 ENCOUNTER — Telehealth: Payer: 59 | Admitting: Physician Assistant

## 2021-09-01 DIAGNOSIS — B379 Candidiasis, unspecified: Secondary | ICD-10-CM

## 2021-09-01 DIAGNOSIS — R3989 Other symptoms and signs involving the genitourinary system: Secondary | ICD-10-CM

## 2021-09-01 DIAGNOSIS — T3695XA Adverse effect of unspecified systemic antibiotic, initial encounter: Secondary | ICD-10-CM

## 2021-09-01 MED ORDER — FLUCONAZOLE 150 MG PO TABS
150.0000 mg | ORAL_TABLET | Freq: Once | ORAL | 0 refills | Status: AC
  Filled 2021-09-01: qty 1, 1d supply, fill #0

## 2021-09-01 MED ORDER — SULFAMETHOXAZOLE-TRIMETHOPRIM 800-160 MG PO TABS
1.0000 | ORAL_TABLET | Freq: Two times a day (BID) | ORAL | 0 refills | Status: DC
  Filled 2021-09-01: qty 10, 5d supply, fill #0

## 2021-09-01 NOTE — Patient Instructions (Signed)
Cephus Richer, thank you for joining Mar Daring, PA-C for today's virtual visit.  While this provider is not your primary care provider (PCP), if your PCP is located in our provider database this encounter information will be shared with them immediately following your visit.  Consent: (Patient) Alyssa Schultz provided verbal consent for this virtual visit at the beginning of the encounter.  Current Medications:  Current Outpatient Medications:    fluconazole (DIFLUCAN) 150 MG tablet, Take 1 tablet (150 mg total) by mouth once for 1 dose., Disp: 1 tablet, Rfl: 0   sulfamethoxazole-trimethoprim (BACTRIM DS) 800-160 MG tablet, Take 1 tablet by mouth 2 (two) times daily., Disp: 10 tablet, Rfl: 0   aspirin EC 81 MG EC tablet, Take 1 tablet (81 mg total) by mouth daily., Disp: 30 tablet, Rfl: 0   Blood Glucose Monitoring Suppl (FREESTYLE FREEDOM LITE) w/Device KIT, , Disp: , Rfl:    glucose blood (FREESTYLE LITE) test strip, Used to check blood sugars once a day., Disp: 100 each, Rfl: 4   losartan (COZAAR) 25 MG tablet, TAKE 1/2 TABLET BY MOUTH 3 TIMES DAILY (Patient taking differently: Take 25 mg by mouth daily.), Disp: 75 tablet, Rfl: 1   meclizine (ANTIVERT) 25 MG tablet, Take 2 tablets (50 mg total) by mouth 3 (three) times daily as needed for dizziness, Disp: 60 tablet, Rfl: 1   ondansetron (ZOFRAN-ODT) 4 MG disintegrating tablet, Take 1 tablet (4 mg total) by mouth every 8 (eight) hours as needed for nausea or vomiting., Disp: 30 tablet, Rfl: 0   rosuvastatin (CRESTOR) 20 MG tablet, TAKE 1 TABLET BY MOUTH DAILY, Disp: 90 tablet, Rfl: 1   Semaglutide,0.25 or 0.5MG/DOS, (OZEMPIC, 0.25 OR 0.5 MG/DOSE,) 2 MG/1.5ML SOPN, Inject 0.25 mg into the skin once a week. After 4 weeks, increase to 0.23m [place] qwk., Disp: 3 mL, Rfl: 3   TRUEPLUS LANCETS 30G MISC, USE AS DIRECTED, Disp: 100 each, Rfl: 5   valACYclovir (VALTREX) 500 MG tablet, Take 2 tablets (1,000 mg total) by mouth 2 (two) times daily.  Take prn cold sore on on day, Disp: 12 tablet, Rfl: 1   Medications ordered in this encounter:  Meds ordered this encounter  Medications   sulfamethoxazole-trimethoprim (BACTRIM DS) 800-160 MG tablet    Sig: Take 1 tablet by mouth 2 (two) times daily.    Dispense:  10 tablet    Refill:  0    Order Specific Question:   Supervising Provider    Answer:   MSabra Heck BRIAN [[id number]]   fluconazole (DIFLUCAN) 150 MG tablet    Sig: Take 1 tablet (150 mg total) by mouth once for 1 dose.    Dispense:  1 tablet    Refill:  0    Order Specific Question:   Supervising Provider    Answer:   MSabra Heck BRIAN [[id number]]     *If you need refills on other medications prior to your next appointment, please contact your pharmacy*  Follow-Up: Call back or seek an in-person evaluation if the symptoms worsen or if the condition fails to improve as anticipated.  Other Instructions Urinary Tract Infection, Adult A urinary tract infection (UTI) is an infection of any part of the urinary tract. The urinary tract includes: The kidneys. The ureters. The bladder. The urethra. These organs make, store, and get rid of pee (urine) in the body. What are the causes? This infection is caused by germs (bacteria) in your genital area. These germs grow and cause swelling (inflammation) of  your urinary tract. What increases the risk? The following factors may make you more likely to develop this condition: Using a small, thin tube (catheter) to drain pee. Not being able to control when you pee or poop (incontinence). Being female. If you are female, these things can increase the risk: Using these methods to prevent pregnancy: A medicine that kills sperm (spermicide). A device that blocks sperm (diaphragm). Having low levels of a female hormone (estrogen). Being pregnant. You are more likely to develop this condition if: You have genes that add to your risk. You are sexually active. You take antibiotic medicines. You have  trouble peeing because of: A prostate that is bigger than normal, if you are female. A blockage in the part of your body that drains pee from the bladder. A kidney stone. A nerve condition that affects your bladder. Not getting enough to drink. Not peeing often enough. You have other conditions, such as: Diabetes. A weak disease-fighting system (immune system). Sickle cell disease. Gout. Injury of the spine. What are the signs or symptoms? Symptoms of this condition include: Needing to pee right away. Peeing small amounts often. Pain or burning when peeing. Blood in the pee. Pee that smells bad or not like normal. Trouble peeing. Pee that is cloudy. Fluid coming from the vagina, if you are female. Pain in the belly or lower back. Other symptoms include: Vomiting. Not feeling hungry. Feeling mixed up (confused). This may be the first symptom in older adults. Being tired and grouchy (irritable). A fever. Watery poop (diarrhea). How is this treated? Taking antibiotic medicine. Taking other medicines. Drinking enough water. In some cases, you may need to see a specialist. Follow these instructions at home: Medicines Take over-the-counter and prescription medicines only as told by your doctor. If you were prescribed an antibiotic medicine, take it as told by your doctor. Do not stop taking it even if you start to feel better. General instructions Make sure you: Pee until your bladder is empty. Do not hold pee for a long time. Empty your bladder after sex. Wipe from front to back after peeing or pooping if you are a female. Use each tissue one time when you wipe. Drink enough fluid to keep your pee pale yellow. Keep all follow-up visits. Contact a doctor if: You do not get better after 1-2 days. Your symptoms go away and then come back. Get help right away if: You have very bad back pain. You have very bad pain in your lower belly. You have a fever. You have  chills. You feeling like you will vomit or you vomit. Summary A urinary tract infection (UTI) is an infection of any part of the urinary tract. This condition is caused by germs in your genital area. There are many risk factors for a UTI. Treatment includes antibiotic medicines. Drink enough fluid to keep your pee pale yellow. This information is not intended to replace advice given to you by your health care provider. Make sure you discuss any questions you have with your health care provider. Document Revised: 03/15/2020 Document Reviewed: 03/15/2020 Elsevier Patient Education  2022 [employer].    If you have been instructed to have an in-person evaluation today at a local Urgent Care facility, please use the link below. It will take you to a list of all of our available [facility], including address, phone number and hours of operation. Please do not delay care.  [facility]  If you or a  family member do not have a primary care provider, use the link below to schedule a visit and establish care. When you choose a [facility] primary care physician or advanced practice provider, you gain a long-term partner in health. Find a Primary Care Provider  Learn more about [facility]'s in-office and virtual care options: [facility]

## 2021-09-01 NOTE — Progress Notes (Signed)
Virtual Visit Consent   Alyssa Schultz, you are scheduled for a virtual visit with a [facility] provider today.     Just as with appointments in the office, your consent must be obtained to participate.  Your consent will be active for this visit and any virtual visit you may have with one of our providers in the next 365 days.     If you have a MyChart account, a copy of this consent can be sent to you electronically.  All virtual visits are billed to your insurance company just like a traditional visit in the office.    As this is a virtual visit, video technology does not allow for your provider to perform a traditional examination.  This may limit your provider's ability to fully assess your condition.  If your provider identifies any concerns that need to be evaluated in person or the need to arrange testing (such as labs, EKG, etc.), we will make arrangements to do so.     Although advances in technology are sophisticated, we cannot ensure that it will always work on either your end or our end.  If the connection with a video visit is poor, the visit may have to be switched to a telephone visit.  With either a video or telephone visit, we are not always able to ensure that we have a secure connection.     I need to obtain your verbal consent now.   Are you willing to proceed with your visit today?    Alyssa Schultz has provided verbal consent on 09/01/2021 for a virtual visit (video or telephone).   Mar Daring, PA-C   Date: 09/01/2021 5:55 PM   Virtual Visit via Video Note   I, Mar Daring, connected with  Alyssa Schultz  ([id number], Jan 07, 1966) on 09/01/21 at  5:45 PM EST by a video-enabled telemedicine application and verified that I am speaking with the correct person using two identifiers.  Location: Patient: Virtual Visit Location Patient: Home Provider: Virtual Visit Location Provider: Home Office   I discussed the limitations of evaluation and management by  telemedicine and the availability of in person appointments. The patient expressed understanding and agreed to proceed.    History of Present Illness: Alyssa Schultz is a 56 y.o. who identifies as a female who was assigned female at birth, and is being seen today for suspected uti.  HPI: Urinary Tract Infection  This is a new problem. The current episode started yesterday. The problem occurs every urination. The quality of the pain is described as burning. There has been no fever. Associated symptoms include frequency, hesitancy and urgency. Pertinent negatives include no chills, discharge, flank pain, hematuria, nausea or vomiting. She has tried increased fluids (AZO) for the symptoms. The treatment provided moderate relief. Her past medical history is significant for recurrent UTIs.     Problems:  Patient Active Problem List   Diagnosis Date Noted   Positive colorectal cancer screening using Cologuard test    Polyp of ascending colon    Polyp of descending colon    Skin soreness 11/18/2020   Cervical adenopathy 04/12/2020   Dysuria 04/12/2020   Vaginal itching 04/12/2020   Acute vaginitis 04/12/2020   Vaccine counseling 03/27/2020   Abnormal SPEP 01/19/2020   Proteinuria 12/20/2019   Angina at rest [facility]) 10/11/2019   Ankle edema 10/11/2019   TIA (transient ischemic attack) 09/05/2019   Radiculopathy 07/26/2019   Leg pain 02/25/2018   Edema 02/25/2018  Hyperlipidemia 12/10/2017   Screening for breast cancer 06/03/2017   Screen for colon cancer 06/03/2017   Bronchitis 12/03/2016   Left hip pain 06/04/2016   Routine general medical examination at a health care facility 03/04/2016   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 ([place]) 09/19/2015   Allergic rhinitis 10/14/2012   Hypertension 11/09/2011   Rash 10/01/2011   Diabetes mellitus type 2, controlled ([place]) 05/21/2011   BMI 30.0-30.9,adult 05/21/2011    Allergies:  Allergies  Allergen Reactions    Influenza Vaccine Live     Per Dr - Pt is to not receive vaccine    Influenza Vaccines Other (See Comments)    Per Dr - Pt is to not receive vaccine    Hydrocodone Rash   Morphine And Related Other (See Comments)    Makes her crazy   Prednisone     "makes me crazy"   Medications:  Current Outpatient Medications:    fluconazole (DIFLUCAN) 150 MG tablet, Take 1 tablet (150 mg total) by mouth once for 1 dose., Disp: 1 tablet, Rfl: 0   sulfamethoxazole-trimethoprim (BACTRIM DS) 800-160 MG tablet, Take 1 tablet by mouth 2 (two) times daily., Disp: 10 tablet, Rfl: 0   aspirin EC 81 MG EC tablet, Take 1 tablet (81 mg total) by mouth daily., Disp: 30 tablet, Rfl: 0   Blood Glucose Monitoring Suppl (FREESTYLE FREEDOM LITE) w/Device KIT, , Disp: , Rfl:    glucose blood (FREESTYLE LITE) test strip, Used to check blood sugars once a day., Disp: 100 each, Rfl: 4   losartan (COZAAR) 25 MG tablet, TAKE 1/2 TABLET BY MOUTH 3 TIMES DAILY (Patient taking differently: Take 25 mg by mouth daily.), Disp: 75 tablet, Rfl: 1   meclizine (ANTIVERT) 25 MG tablet, Take 2 tablets (50 mg total) by mouth 3 (three) times daily as needed for dizziness, Disp: 60 tablet, Rfl: 1   ondansetron (ZOFRAN-ODT) 4 MG disintegrating tablet, Take 1 tablet (4 mg total) by mouth every 8 (eight) hours as needed for nausea or vomiting., Disp: 30 tablet, Rfl: 0   rosuvastatin (CRESTOR) 20 MG tablet, TAKE 1 TABLET BY MOUTH DAILY, Disp: 90 tablet, Rfl: 1   Semaglutide,0.25 or 0.5MG/DOS, (OZEMPIC, 0.25 OR 0.5 MG/DOSE,) 2 MG/1.5ML SOPN, Inject 0.25 mg into the skin once a week. After 4 weeks, increase to 0.60m [place] qwk., Disp: 3 mL, Rfl: 3   TRUEPLUS LANCETS 30G MISC, USE AS DIRECTED, Disp: 100 each, Rfl: 5   valACYclovir (VALTREX) 500 MG tablet, Take 2 tablets (1,000 mg total) by mouth 2 (two) times daily. Take prn cold sore on on day, Disp: 12 tablet, Rfl: 1  Observations/Objective: Patient is well-developed, well-nourished in no acute  distress.  Resting comfortably at home.  Head is normocephalic, atraumatic.  No labored breathing.  Speech is clear and coherent with logical content.  Patient is alert and oriented at baseline.    Assessment and Plan: 1. Suspected UTI - sulfamethoxazole-trimethoprim (BACTRIM DS) 800-160 MG tablet; Take 1 tablet by mouth 2 (two) times daily.  Dispense: 10 tablet; Refill: 0  2. Antibiotic-induced yeast infection - fluconazole (DIFLUCAN) 150 MG tablet; Take 1 tablet (150 mg total) by mouth once for 1 dose.  Dispense: 1 tablet; Refill: 0  - Worsening symptoms.  - Will treat empirically with Bactrim - May continue AZO tonight for spasm - Diflucan given as prophylaxis as patient tends to get vaginal yeast infections with antibiotic use - Continue to push fluids.  - She is to call  or seek in person evaluation if symptoms do not improve or if they worsen.    Follow Up Instructions: I discussed the assessment and treatment plan with the patient. The patient was provided an opportunity to ask questions and all were answered. The patient agreed with the plan and demonstrated an understanding of the instructions.  A copy of instructions were sent to the patient via MyChart unless otherwise noted below.   The patient was advised to call back or seek an in-person evaluation if the symptoms worsen or if the condition fails to improve as anticipated.  Time:  I spent 10 minutes with the patient via telehealth technology discussing the above problems/concerns.    Mar Daring, PA-C

## 2021-09-02 ENCOUNTER — Other Ambulatory Visit: Payer: Self-pay

## 2021-09-03 ENCOUNTER — Other Ambulatory Visit (HOSPITAL_COMMUNITY): Payer: Self-pay

## 2021-09-03 ENCOUNTER — Other Ambulatory Visit: Payer: Self-pay

## 2021-09-08 ENCOUNTER — Other Ambulatory Visit: Payer: Self-pay

## 2021-09-08 DIAGNOSIS — M65331 Trigger finger, right middle finger: Secondary | ICD-10-CM | POA: Insufficient documentation

## 2021-09-08 MED ORDER — MELOXICAM 7.5 MG PO TABS
ORAL_TABLET | ORAL | 0 refills | Status: DC
  Filled 2021-09-08: qty 28, 14d supply, fill #0

## 2021-09-09 ENCOUNTER — Other Ambulatory Visit: Payer: Self-pay

## 2021-09-09 ENCOUNTER — Encounter: Payer: Self-pay | Admitting: Family

## 2021-09-10 ENCOUNTER — Other Ambulatory Visit: Payer: Self-pay

## 2021-09-10 ENCOUNTER — Other Ambulatory Visit: Payer: Self-pay | Admitting: Family

## 2021-09-10 DIAGNOSIS — E119 Type 2 diabetes mellitus without complications: Secondary | ICD-10-CM

## 2021-09-19 ENCOUNTER — Other Ambulatory Visit: Payer: Self-pay

## 2021-10-01 DIAGNOSIS — E1129 Type 2 diabetes mellitus with other diabetic kidney complication: Secondary | ICD-10-CM | POA: Diagnosis not present

## 2021-10-01 DIAGNOSIS — R809 Proteinuria, unspecified: Secondary | ICD-10-CM | POA: Diagnosis not present

## 2021-10-01 DIAGNOSIS — E7800 Pure hypercholesterolemia, unspecified: Secondary | ICD-10-CM | POA: Diagnosis not present

## 2021-10-02 ENCOUNTER — Encounter: Payer: Self-pay | Admitting: Family

## 2021-10-08 DIAGNOSIS — E119 Type 2 diabetes mellitus without complications: Secondary | ICD-10-CM | POA: Diagnosis not present

## 2021-10-08 DIAGNOSIS — I10 Essential (primary) hypertension: Secondary | ICD-10-CM | POA: Diagnosis not present

## 2021-10-08 DIAGNOSIS — R87810 Cervical high risk human papillomavirus (HPV) DNA test positive: Secondary | ICD-10-CM | POA: Diagnosis not present

## 2021-10-08 DIAGNOSIS — N898 Other specified noninflammatory disorders of vagina: Secondary | ICD-10-CM | POA: Diagnosis not present

## 2021-10-08 DIAGNOSIS — Z01419 Encounter for gynecological examination (general) (routine) without abnormal findings: Secondary | ICD-10-CM | POA: Diagnosis not present

## 2021-10-14 ENCOUNTER — Other Ambulatory Visit
Admission: RE | Admit: 2021-10-14 | Discharge: 2021-10-14 | Disposition: A | Discharge status: Home / Self Care | Payer: 59 | Source: Ambulatory Visit | Attending: Family | Admitting: Family

## 2021-10-14 DIAGNOSIS — E119 Type 2 diabetes mellitus without complications: Secondary | ICD-10-CM | POA: Diagnosis not present

## 2021-10-14 LAB — LIPID PANEL
Cholesterol: 154 mg/dL (ref 0–200)
HDL: 77 mg/dL (ref >40)
LDL Cholesterol: 67 mg/dL (ref 0–99)
Total CHOL/HDL Ratio: 2 RATIO
Triglycerides: 50 mg/dL (ref <150)
VLDL: 10 mg/dL (ref 0–40)

## 2021-10-14 LAB — COMPREHENSIVE METABOLIC PANEL
ALT: 21 U/L (ref 0–44)
AST: 23 U/L (ref 15–41)
Albumin: 4.3 g/dL (ref 3.5–5.0)
Alkaline Phosphatase: 54 U/L (ref 38–126)
Anion gap: 9 (ref 5–15)
BUN: 12 mg/dL (ref 6–20)
CO2: 25 mmol/L (ref 22–32)
Calcium: 9.7 mg/dL (ref 8.9–10.3)
Chloride: 110 mmol/L (ref 98–111)
Creatinine, Ser: 0.61 mg/dL (ref 0.44–1.00)
GFR, Estimated: >60 mL/min (ref >60)
Glucose, Bld: 118 mg/dL — ABNORMAL HIGH (ref 70–99)
Potassium: 4.1 mmol/L (ref 3.5–5.1)
Sodium: 144 mmol/L (ref 135–145)
Total Bilirubin: 0.6 mg/dL (ref 0.3–1.2)
Total Protein: 7.8 g/dL (ref 6.5–8.1)

## 2021-10-15 LAB — HEMOGLOBIN A1C
Hgb A1c MFr Bld: 6.2 % — ABNORMAL HIGH (ref 4.8–5.6)
Mean Plasma Glucose: 131 mg/dL

## 2021-10-15 LAB — MICROALBUMIN / CREATININE URINE RATIO
Creatinine, Urine: 43.5 mg/dL
Microalb Creat Ratio: 10 mg/g creat (ref 0–29)
Microalb, Ur: 4.5 ug/mL — ABNORMAL HIGH

## 2021-10-19 ENCOUNTER — Encounter: Payer: Self-pay | Admitting: Family

## 2021-10-20 ENCOUNTER — Encounter: Payer: Self-pay | Admitting: Family

## 2021-10-20 ENCOUNTER — Ambulatory Visit: Payer: 59 | Admitting: Family

## 2021-10-20 ENCOUNTER — Other Ambulatory Visit: Payer: Self-pay

## 2021-10-20 VITALS — BP 138/78 | HR 91 | Temp 97.6°F | Ht 65.0 in | Wt 164.1 lb

## 2021-10-20 DIAGNOSIS — E119 Type 2 diabetes mellitus without complications: Secondary | ICD-10-CM

## 2021-10-20 DIAGNOSIS — I10 Essential (primary) hypertension: Secondary | ICD-10-CM

## 2021-10-20 DIAGNOSIS — E785 Hyperlipidemia, unspecified: Secondary | ICD-10-CM

## 2021-10-20 DIAGNOSIS — R809 Proteinuria, unspecified: Secondary | ICD-10-CM | POA: Diagnosis not present

## 2021-10-20 MED ORDER — EMPAGLIFLOZIN 10 MG PO TABS
10.0000 mg | ORAL_TABLET | Freq: Every day | ORAL | 2 refills | Status: DC
  Filled 2021-10-21: qty 90, 90d supply, fill #0

## 2021-10-20 NOTE — Patient Instructions (Signed)
We will give Jardiance a trial at a very low dose.  Please follow low glycemic diet and refrain from orange juice. ? ?Always a pleasure seeing you ?

## 2021-10-20 NOTE — Assessment & Plan Note (Signed)
LDL < 70. Continue 20 mg Crestor ?

## 2021-10-20 NOTE — Progress Notes (Signed)
? ?Subjective:  ? ? Patient ID: Alyssa Schultz, female    DOB: 1965-09-19, 56 y.o.   MRN: [id number] ? ?CC: UNNAMED HINO is a 56 y.o. female who presents today for follow up.  ? ?HPI: She feels well today.  No new complaints  ? ?HTN- at home BP 106/72. Endorses rushing to get here from [employer].  She is compliant with losartan 25 mg daily ?She is exercising by walking often.  ? ?No cp. ? ? ?Hyperlipidemia-compliant with 20 mg Crestor ?Dm-she had tried Ozempic 0.5 mg and as she increased had multiple side effects including including stomachache, constipation, tongue tingling. She has stopped medication for 2 weeks.  She is no longer on metformin ? ?History of proteinuria.  Following nephrology, Dr. Holley Raring ?HISTORY:  ?Past Medical History:  ?Diagnosis Date  ? Complication of anesthesia   ? hard to wake in 2017, patient reports she is 30 lb lighter now  ? Diabetes mellitus   ? dx 2014   just take pills  ? Hyperlipidemia   ? Hypertension   ? Neuromuscular disorder ([place])   ? ?Past Surgical History:  ?Procedure Laterality Date  ? abalsion    ? ablation    ? ANTERIOR CERVICAL DECOMP/DISCECTOMY FUSION N/A 09/19/2015  ? Procedure: ANTERIOR CERVICAL DECOMPRESSION/DISCECTOMY FUSION 2 LEVELS;  Surgeon: Phylliss Bob, MD;  Location: [place];  Service: Orthopedics;  Laterality: N/A;  Anterior cervical decompression fusion, cervical 3-4, cervical 4-5 decompression with instrumentation and allograft  ? BACK SURGERY    ? COLONOSCOPY WITH PROPOFOL N/A 07/18/2021  ? Procedure: COLONOSCOPY WITH PROPOFOL;  Surgeon: Virgel Manifold, MD;  Location: [facility];  Service: Endoscopy;  Laterality: N/A;  ? FACIAL RECONSTRUCTION SURGERY    ? TRANSFORAMINAL LUMBAR INTERBODY FUSION (TLIF) WITH PEDICLE SCREW FIXATION 2 LEVEL Right 07/26/2019  ? Procedure: RIGHT-SIDED LUMBAR 5 - SACRUM 1 TRANSFORAMINAL LUMBAR INTERBODY FUSION WITH INSTRUMENTATION AND ALLOGRAFT;  Surgeon: Phylliss Bob, MD;  Location: [place];  Service: Orthopedics;  Laterality:  Right;  ? ?Family History  ?Problem Relation Age of Onset  ? Diabetes Mother   ? Diabetes Maternal Grandmother   ? Hypertension Maternal Grandfather   ? Breast cancer Neg Hx   ? ? ?Allergies: Influenza vaccine live, Influenza vaccines, Hydrocodone, Morphine and related, and Prednisone ?Current Outpatient Medications on File Prior to Visit  ?Medication Sig Dispense Refill  ? aspirin EC 81 MG EC tablet Take 1 tablet (81 mg total) by mouth daily. 30 tablet 0  ? Blood Glucose Monitoring Suppl (FREESTYLE FREEDOM LITE) w/Device KIT     ? glucose blood (FREESTYLE LITE) test strip Used to check blood sugars once a day. 100 each 4  ? losartan (COZAAR) 25 MG tablet TAKE 1/2 TABLET BY MOUTH 3 TIMES DAILY (Patient taking differently: Take 25 mg by mouth daily.) 75 tablet 1  ? meclizine (ANTIVERT) 25 MG tablet Take 2 tablets (50 mg total) by mouth 3 (three) times daily as needed for dizziness 60 tablet 1  ? ondansetron (ZOFRAN-ODT) 4 MG disintegrating tablet Take 1 tablet (4 mg total) by mouth every 8 (eight) hours as needed for nausea or vomiting. 30 tablet 0  ? rosuvastatin (CRESTOR) 20 MG tablet TAKE 1 TABLET BY MOUTH DAILY 90 tablet 1  ? sulfamethoxazole-trimethoprim (BACTRIM DS) 800-160 MG tablet Take 1 tablet by mouth 2 (two) times daily. 10 tablet 0  ? TRUEPLUS LANCETS 30G MISC USE AS DIRECTED 100 each 5  ? valACYclovir (VALTREX) 500 MG tablet Take 2 tablets (1,000  mg total) by mouth 2 (two) times daily. Take prn cold sore on on day 12 tablet 1  ? meloxicam (MOBIC) 7.5 MG tablet Take One tab PO BID with food 28 tablet 0  ? ?No current facility-administered medications on file prior to visit.  ? ? ?Social History  ? ?Tobacco Use  ? Smoking status: Never  ? Smokeless tobacco: Never  ?Vaping Use  ? Vaping Use: Never used  ?Substance Use Topics  ? Alcohol use: Yes  ?  Comment: Occasional wine  ? Drug use: No  ? ? ?Review of Systems  ?Constitutional:  Negative for chills and fever.  ?Respiratory:  Negative for cough.    ?Cardiovascular:  Negative for chest pain and palpitations.  ?Gastrointestinal:  Negative for nausea and vomiting.  ?   ?Objective:  ?  ?BP 138/78 (BP Location: Left Arm, Patient Position: Sitting, Cuff Size: Normal)   Pulse 91   Temp 97.6 ?F (36.4 ?C) (Temporal)   Ht 5' 5"  (1.651 m)   Wt 164 lb 1.6 oz (74.4 kg)   LMP 07/24/2007 Comment: ablation in 2008-pt reports she went through menopause after this  SpO2 97%   BMI 27.31 kg/m?  ?BP Readings from Last 3 Encounters:  ?10/20/21 138/78  ?07/18/21 112/75  ?06/23/21 110/64  ? ?Wt Readings from Last 3 Encounters:  ?10/20/21 164 lb 1.6 oz (74.4 kg)  ?07/18/21 156 lb 11.2 oz (71.1 kg)  ?06/23/21 163 lb 3.2 oz (74 kg)  ? ? ?Physical Exam ?Vitals reviewed.  ?Constitutional:   ?   Appearance: She is well-developed.  ?Eyes:  ?   Conjunctiva/sclera: Conjunctivae normal.  ?Cardiovascular:  ?   Rate and Rhythm: Normal rate and regular rhythm.  ?   Pulses: Normal pulses.  ?   Heart sounds: Normal heart sounds.  ?Pulmonary:  ?   Effort: Pulmonary effort is normal.  ?   Breath sounds: Normal breath sounds. No wheezing, rhonchi or rales.  ?Skin: ?   General: Skin is warm and dry.  ?Neurological:  ?   Mental Status: She is alert.  ?Psychiatric:     ?   Speech: Speech normal.     ?   Behavior: Behavior normal.     ?   Thought Content: Thought content normal.  ? ? ?   ?Assessment & Plan:  ? ?Problem List Items Addressed This Visit   ? ?  ? Cardiovascular and Mediastinum  ? Hypertension  ?  Overall very well controlled, particularly at home.  Continue losartan 25 mg daily.  She will continue to monitor blood pressure at home. ?  ?  ?  ? Endocrine  ? Diabetes mellitus type 2, controlled ([place]) - Primary  ?  Lab Results  ?Component Value Date  ? HGBA1C 6.2 (H) 10/14/2021  ?Excellent control.  She declines resuming metformin. We have opted to stop Ozempic due to side effects and trial Jardiance 10 mg for diabetic control, renal protection. ?  ?  ? Relevant Medications  ?  empagliflozin (JARDIANCE) 10 MG TABS tablet  ?  ? Other  ? Hyperlipidemia  ?  LDL < 70. Continue 20 mg Crestor ?  ?  ? Proteinuria  ?  Improved.  Continue to follow with Dr. Holley Raring.  For diabetic control, we opted to stop Ozempic due to side effects and trial Jardiance 10 mg for diabetic control, renal protection. ?  ?  ? ? ? ?I have discontinued Cristopher Peru. Doenges's Ozempic (0.25 or 0.5 MG/DOSE). I am  also having her start on empagliflozin. Additionally, I am having her maintain her TRUEplus Lancets 30G, aspirin, valACYclovir, FreeStyle Freedom Lite, FREESTYLE LITE, meclizine, losartan, rosuvastatin, ondansetron, sulfamethoxazole-trimethoprim, and meloxicam. ? ? ?Meds ordered this encounter  ?Medications  ? empagliflozin (JARDIANCE) 10 MG TABS tablet  ?  Sig: Take 1 tablet (10 mg total) by mouth daily before breakfast.  ?  Dispense:  30 tablet  ?  Refill:  2  ?  Order Specific Question:   Supervising Provider  ?  Answer:   Crecencio Mc [[id number]]  ? ? ?Return precautions given.  ? ?Risks, benefits, and alternatives of the medications and treatment plan prescribed today were discussed, and patient expressed understanding.  ? ?Education regarding symptom management and diagnosis given to patient on AVS. ? ?Continue to follow with Burnard Hawthorne, FNP for routine health maintenance.  ? ?Cephus Richer and I agreed with plan.  ? ?Mable Paris, FNP ? ? ? ?

## 2021-10-20 NOTE — Assessment & Plan Note (Signed)
Lab Results  ?Component Value Date  ? HGBA1C 6.2 (H) 10/14/2021  ? ?Excellent control.  She declines resuming metformin. We have opted to stop Ozempic due to side effects and trial Jardiance 10 mg for diabetic control, renal protection. ?

## 2021-10-20 NOTE — Assessment & Plan Note (Addendum)
Overall very well controlled, particularly at home.  Continue losartan 25 mg daily.  She will continue to monitor blood pressure at home. ?

## 2021-10-20 NOTE — Assessment & Plan Note (Signed)
Improved.  Continue to follow with Dr. Holley Raring.  For diabetic control, we opted to stop Ozempic due to side effects and trial Jardiance 10 mg for diabetic control, renal protection. ?

## 2021-10-20 NOTE — Progress Notes (Signed)
Patient stated that [facility] makes her gain weight, would like to discontinue ?

## 2021-10-21 ENCOUNTER — Other Ambulatory Visit: Payer: Self-pay

## 2021-10-23 ENCOUNTER — Other Ambulatory Visit: Payer: Self-pay

## 2021-10-27 ENCOUNTER — Other Ambulatory Visit: Payer: Self-pay

## 2021-10-27 DIAGNOSIS — E119 Type 2 diabetes mellitus without complications: Secondary | ICD-10-CM

## 2021-10-28 ENCOUNTER — Ambulatory Visit (INDEPENDENT_AMBULATORY_CARE_PROVIDER_SITE_OTHER): Payer: 59

## 2021-10-28 ENCOUNTER — Ambulatory Visit
Admission: EM | Admit: 2021-10-28 | Discharge: 2021-10-28 | Disposition: A | Discharge status: Home / Self Care | Payer: 59 | Attending: Physician Assistant | Admitting: Physician Assistant

## 2021-10-28 ENCOUNTER — Other Ambulatory Visit: Payer: Self-pay

## 2021-10-28 DIAGNOSIS — M1712 Unilateral primary osteoarthritis, left knee: Secondary | ICD-10-CM | POA: Diagnosis not present

## 2021-10-28 DIAGNOSIS — M7989 Other specified soft tissue disorders: Secondary | ICD-10-CM | POA: Diagnosis not present

## 2021-10-28 DIAGNOSIS — M79662 Pain in left lower leg: Secondary | ICD-10-CM | POA: Diagnosis not present

## 2021-10-28 DIAGNOSIS — M79605 Pain in left leg: Secondary | ICD-10-CM

## 2021-10-28 NOTE — ED Triage Notes (Signed)
Pt requesting x-ray. Concerns with possible fracture and DVT.  ?

## 2021-10-28 NOTE — ED Provider Notes (Signed)
[facility]    CSN: [id number] Arrival date & time: 10/28/21  1601      History   Chief Complaint Chief Complaint  Patient presents with   Leg Pain    HPI Alyssa Schultz is a 56 y.o. female presenting for left anterior shin pain over the past 1 month.  Patient denies any injury but states she walks 2 miles daily.  She says she walks a lot at work and also for exercise.  She is concerned about a possible stress fracture.  Patient denies any pain of her calf.  Mild associated swelling.  She says the skin does burn a little.  She has a history of 2 back surgeries.  Reports lumbar fusion surgery many years ago.  States she has not had any back pain or really any issues with leg pain since.  Reports when she had neuropathy before it was on the right side.  She has been taking Tylenol and ibuprofen and says it has helped.  She says she is able to walk a lot without any significant pain.  She says the area only hurts when she is palpating.  Patient denies any associated chest pain, palpitations, shortness of breath.  No history of blood clots.  HPI  Past Medical History:  Diagnosis Date   Complication of anesthesia    hard to wake in 2017, patient reports she is 30 lb lighter now   Diabetes mellitus    dx 2014   just take pills   Hyperlipidemia    Hypertension    Neuromuscular disorder [facility])     Patient Active Problem List   Diagnosis Date Noted   Positive colorectal cancer screening using Cologuard test    Polyp of ascending colon    Polyp of descending colon    Skin soreness 11/18/2020   Cervical adenopathy 04/12/2020   Dysuria 04/12/2020   Vaginal itching 04/12/2020   Acute vaginitis 04/12/2020   Vaccine counseling 03/27/2020   Abnormal SPEP 01/19/2020   Proteinuria 12/20/2019   Angina at rest [facility]) 10/11/2019   Ankle edema 10/11/2019   TIA (transient ischemic attack) 09/05/2019   Radiculopathy 07/26/2019   Leg pain 02/25/2018   Edema 02/25/2018    Hyperlipidemia 12/10/2017   Screening for breast cancer 06/03/2017   Screen for colon cancer 06/03/2017   Bronchitis 12/03/2016   Left hip pain 06/04/2016   Routine general medical examination at a health care facility 03/04/2016   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 (HCC) 09/19/2015   Allergic rhinitis 10/14/2012   Hypertension 11/09/2011   Rash 10/01/2011   Diabetes mellitus type 2, controlled (HCC) 05/21/2011   BMI 30.0-30.9,adult 05/21/2011    Past Surgical History:  Procedure Laterality Date   abalsion     ablation     ANTERIOR CERVICAL DECOMP/DISCECTOMY FUSION N/A 09/19/2015   Procedure: ANTERIOR CERVICAL DECOMPRESSION/DISCECTOMY FUSION 2 LEVELS;  Surgeon: Estill Bamberg, MD;  Location: [facility];  Service: Orthopedics;  Laterality: N/A;  Anterior cervical decompression fusion, cervical 3-4, cervical 4-5 decompression with instrumentation and allograft   BACK SURGERY     COLONOSCOPY WITH PROPOFOL N/A 07/18/2021   Procedure: COLONOSCOPY WITH PROPOFOL;  Surgeon: Pasty Spillers, MD;  Location: [facility];  Service: Endoscopy;  Laterality: N/A;   FACIAL RECONSTRUCTION SURGERY     TRANSFORAMINAL LUMBAR INTERBODY FUSION (TLIF) WITH PEDICLE SCREW FIXATION 2 LEVEL Right 07/26/2019   Procedure: RIGHT-SIDED LUMBAR 5 - SACRUM 1 TRANSFORAMINAL LUMBAR INTERBODY FUSION WITH INSTRUMENTATION AND ALLOGRAFT;  Surgeon: Estill Bamberg, MD;  Location: [facility] OR;  Service: Orthopedics;  Laterality: Right;    OB History   No obstetric history on file.      Home Medications    Prior to Admission medications   Medication Sig Start Date End Date Taking? Authorizing Provider  aspirin EC 81 MG EC tablet Take 1 tablet (81 mg total) by mouth daily. 09/06/19   Thomasenia Bottoms, MD  Blood Glucose Monitoring Suppl (FREESTYLE FREEDOM LITE) w/Device KIT  10/17/19   [provider]  empagliflozin (JARDIANCE) 10 MG TABS tablet Take 1 tablet (10 mg total) by mouth daily before  breakfast. 10/20/21   Allegra Grana, FNP  glucose blood (FREESTYLE LITE) test strip Used to check blood sugars once a day. 01/09/21   Allegra Grana, FNP  losartan (COZAAR) 25 MG tablet TAKE 1/2 TABLET BY MOUTH 3 TIMES DAILY Patient taking differently: Take 25 mg by mouth daily. 05/07/21 05/07/22  Allegra Grana, FNP  meclizine (ANTIVERT) 25 MG tablet Take 2 tablets (50 mg total) by mouth 3 (three) times daily as needed for dizziness 03/21/21   Allegra Grana, FNP  meloxicam (MOBIC) 7.5 MG tablet Take One tab PO BID with food 09/08/21     ondansetron (ZOFRAN-ODT) 4 MG disintegrating tablet Take 1 tablet (4 mg total) by mouth every 8 (eight) hours as needed for nausea or vomiting. 08/20/21   Allegra Grana, FNP  rosuvastatin (CRESTOR) 20 MG tablet TAKE 1 TABLET BY MOUTH DAILY 05/15/21 05/15/22  Allegra Grana, FNP  sulfamethoxazole-trimethoprim (BACTRIM DS) 800-160 MG tablet Take 1 tablet by mouth 2 (two) times daily. 09/01/21   Margaretann Loveless, PA-C  TRUEPLUS LANCETS 30G MISC USE AS DIRECTED 03/06/16   Shelia Media, MD  valACYclovir (VALTREX) 500 MG tablet Take 2 tablets (1,000 mg total) by mouth 2 (two) times daily. Take prn cold sore on on day 10/09/19   Duanne Limerick, MD    Family History Family History  Problem Relation Age of Onset   Diabetes Mother    Diabetes Maternal Grandmother    Hypertension Maternal Grandfather    Breast cancer Neg Hx     Social History Social History   Tobacco Use   Smoking status: Never   Smokeless tobacco: Never  Vaping Use   Vaping Use: Never used  Substance Use Topics   Alcohol use: Yes    Comment: Occasional wine   Drug use: No     Allergies   Hemophilus b polysaccharide vaccine, Influenza vaccine live, Influenza vaccines, Hydrocodone, Morphine and related, and Prednisone   Review of Systems Review of Systems  Constitutional:  Negative for fatigue and fever.  Respiratory:  Negative for chest tightness and shortness  of breath.   Cardiovascular:  Negative for chest pain.  Musculoskeletal:  Positive for arthralgias and joint swelling. Negative for back pain and gait problem.  Skin:  Negative for color change.  Neurological:  Negative for weakness and numbness.    Physical Exam Triage Vital Signs ED Triage Vitals  Enc Vitals Group     BP 10/28/21 1703 115/79     Pulse Rate 10/28/21 1703 71     Resp --      Temp 10/28/21 1703 98 F (36.7 C)     Temp Source 10/28/21 1703 Oral     SpO2 10/28/21 1703 99 %     Weight --      Height --      Head Circumference --  Peak Flow --      Pain Score 10/28/21 1702 5     Pain Loc --      Pain Edu? --      Excl. in GC? --    No data found.  Updated Vital Signs BP 115/79 (BP Location: Right Arm)   Pulse 71   Temp 98 F (36.7 C) (Oral)   LMP 07/24/2007 Comment: ablation in 2008-pt reports she went through menopause after this  SpO2 99%    Physical Exam Vitals and nursing note reviewed.  Constitutional:      General: She is not in acute distress.    Appearance: Normal appearance. She is not ill-appearing or toxic-appearing.  HENT:     Head: Normocephalic and atraumatic.  Eyes:     General: No scleral icterus.       Right eye: No discharge.        Left eye: No discharge.     Conjunctiva/sclera: Conjunctivae normal.  Cardiovascular:     Rate and Rhythm: Normal rate and regular rhythm.     Heart sounds: Normal heart sounds.  Pulmonary:     Effort: Pulmonary effort is normal. No respiratory distress.     Breath sounds: Normal breath sounds.  Musculoskeletal:     Cervical back: Neck supple.     Left lower leg: Swelling (mild swelling mid tib/fib) and tenderness (TTP over mid tib/fib) present. No deformity.     Comments: No calf tenderness, no erythema or warmth  Skin:    General: Skin is dry.  Neurological:     General: No focal deficit present.     Mental Status: She is alert. Mental status is at baseline.     Motor: No weakness.      Gait: Gait normal.  Psychiatric:        Mood and Affect: Mood normal.        Behavior: Behavior normal.        Thought Content: Thought content normal.     UC Treatments / Results  Labs (all labs ordered are listed, but only abnormal results are displayed) Labs Reviewed - No data to display  EKG   Radiology DG Tibia/Fibula Left  Result Date: 10/28/2021 CLINICAL DATA:  Tibia and fibula pain for 1 month.  No known trauma. EXAM: LEFT TIBIA AND FIBULA - 2 VIEW COMPARISON:  11/12/2017 FINDINGS: A well corticated ossicle is again seen at the distal aspect of the medial malleolus, with mild bone overlap. This again likely represents the sequela of remote trauma. The ankle mortise appears symmetric and intact. No knee joint effusion is seen. Mild chronic enthesopathic change at the quadriceps insertion on the patella. Mild inferior patellar degenerative spurring. IMPRESSION:: IMPRESSION: 1. No acute fracture. 2. Chronic changes as above. Electronically Signed   By: Neita Garnet M.D.   On: 10/28/2021 17:41    Procedures Procedures (including critical care time)  Medications Ordered in UC Medications - No data to display  Initial Impression / Assessment and Plan / UC Course  I have reviewed the triage vital signs and the nursing notes.  Pertinent labs & imaging results that were available during my care of the patient were reviewed by me and considered in my medical decision making (see chart for details).   56 y/o female presents for swelling and pain of an area of the left lower leg.  Patient denies any specific injury but is very active on her feet.  There is no overlying erythema or warmth.  Vitals normal stable today.  Patient does have mild swelling over the tib-fib locally at about the mid shaft.  She also has tenderness in this specific area.  Full range of motion of joints of lower extremity.  Full range of motion of back.  X-ray obtained today to assess for possible stress  fracture.  X-ray does not reveal any acute fracture.  There are some chronic changes.  DVT ordered to rule out clot due to pain, swelling and duration of condition.  Patient to have this done tomorrow.  Advised patient we will treat if she does have a DVT.  If negative, she should follow back up with her PCP or Ortho for further evaluation and management.  Continue to follow RICE guidelines and take Motrin/Tylenol for pain relief.  May need some physical therapy for shinsplints as that may be the cause.  Also discussed that sometimes x-rays do not pick up on all stress factors and she could potentially need an MRI but Ortho would need to order that.  Advise going to ER for any severe acute worsening of her symptoms.   Final Clinical Impressions(s) / UC Diagnoses   Final diagnoses:  Pain and swelling of left lower leg     Discharge Instructions      -The x-ray does not show any cause for your pain and swelling. - You can get the ultrasound done tomorrow.  Someone from scheduling will call. - Ice the area and take ibuprofen and/or Tylenol for pain in the meantime. - If testing is it being negative, please follow-up with orthopedics or your PCP.     ED Prescriptions   None    PDMP not reviewed this encounter.   Shirlee Latch, PA-C 10/28/21 1813

## 2021-10-28 NOTE — ED Triage Notes (Signed)
Patient presents to Urgent Care with complaints of intermittent left leg pain x month. Pt states when she applies heat helps with the pain. Pt states she walks 3 miles daily unsure if it is shin splint. Treating pain with tylenol and ibuprofen.  ? ?Denies chest pain, SOB.  ?

## 2021-10-28 NOTE — Discharge Instructions (Signed)
-  The x-ray does not show any cause for your pain and swelling. ?- You can get the ultrasound done tomorrow.  Someone from scheduling will call. ?- Ice the area and take ibuprofen and/or Tylenol for pain in the meantime. ?- If testing is it being negative, please follow-up with orthopedics or your PCP. ?

## 2021-10-29 ENCOUNTER — Ambulatory Visit: Admission: RE | Admit: 2021-10-29 | Payer: 59 | Source: Ambulatory Visit

## 2021-10-29 ENCOUNTER — Ambulatory Visit
Admission: RE | Admit: 2021-10-29 | Discharge: 2021-10-29 | Disposition: A | Discharge status: Home / Self Care | Payer: 59 | Source: Ambulatory Visit | Attending: Physician Assistant | Admitting: Physician Assistant

## 2021-10-29 DIAGNOSIS — M7989 Other specified soft tissue disorders: Secondary | ICD-10-CM | POA: Diagnosis not present

## 2021-10-29 DIAGNOSIS — R600 Localized edema: Secondary | ICD-10-CM | POA: Diagnosis not present

## 2021-10-29 DIAGNOSIS — M79662 Pain in left lower leg: Secondary | ICD-10-CM | POA: Diagnosis not present

## 2021-10-29 DIAGNOSIS — M7122 Synovial cyst of popliteal space [Baker], left knee: Secondary | ICD-10-CM | POA: Diagnosis not present

## 2021-11-03 ENCOUNTER — Other Ambulatory Visit: Payer: Self-pay

## 2021-11-10 ENCOUNTER — Telehealth: Payer: 59 | Admitting: Emergency Medicine

## 2021-11-10 ENCOUNTER — Other Ambulatory Visit: Payer: Self-pay

## 2021-11-10 DIAGNOSIS — N3000 Acute cystitis without hematuria: Secondary | ICD-10-CM

## 2021-11-10 MED ORDER — CEPHALEXIN 500 MG PO CAPS
500.0000 mg | ORAL_CAPSULE | Freq: Two times a day (BID) | ORAL | 0 refills | Status: AC
  Filled 2021-11-10: qty 14, 7d supply, fill #0

## 2021-11-10 NOTE — Progress Notes (Signed)
E-Visit for Urinary Problems ? ?We are sorry that you are not feeling well.  Here is how we plan to help! ? ?Based on what you shared with me it looks like you most likely have a simple urinary tract infection. ? ?A UTI (Urinary Tract Infection) is a bacterial infection of the bladder. ? ?Most cases of urinary tract infections are simple to treat but a key part of your care is to encourage you to drink plenty of fluids and watch your symptoms carefully. ? ?I have prescribed Keflex 500 mg twice a day for 7 days.  Your symptoms should gradually improve. Call us if the burning in your urine worsens, you develop worsening fever, back pain or pelvic pain or if your symptoms do not resolve after completing the antibiotic. As requested, I have also prescribed fluconazole '150mg'$  in case the antibiotic causes you to have a yeast infection.  ? ?Urinary tract infections can be prevented by drinking plenty of water to keep your body hydrated.  Also be sure when you wipe, wipe from front to back and don't hold it in!  If possible, empty your bladder every 4 hours. ? ?Because you have had similar symptoms 2 months ago, if this treatment plan does not fix your symptoms, you will need to be seen in person for further care.  ? ?HOME CARE ?Drink plenty of fluids ?Compete the full course of the antibiotics even if the symptoms resolve ?Remember, when you need to go?go. Holding in your urine can increase the likelihood of getting a UTI! ?GET HELP RIGHT AWAY IF: ?You cannot urinate ?You get a high fever ?Worsening back pain occurs ?You see blood in your urine ?You feel sick to your stomach or throw up ?You feel like you are going to pass out ? ?MAKE SURE YOU  ?Understand these instructions. ?Will watch your condition. ?Will get help right away if you are not doing well or get worse. ? ? ?Thank you for choosing an e-visit. ? ?Your e-visit answers were reviewed by a board certified advanced clinical practitioner to complete your personal  care plan. Depending upon the condition, your plan could have included both over the counter or prescription medications. ? ?Please review your pharmacy choice. Make sure the pharmacy is open so you can pick up prescription now. If there is a problem, you may contact your provider through [employer] and have the prescription routed to another pharmacy.  Your safety is important to [place]. If you have drug allergies check your prescription carefully.  ? ?For the next 24 hours you can use MyChart to ask questions about today's visit, request a non-urgent call back, or ask for a work or school excuse. ?You will get an email in the next two days asking about your experience. I hope that your e-visit has been valuable and will speed your recovery. ? ?I have spent 5 minutes in review of e-visit questionnaire, review and updating patient chart, medical decision making and response to patient.  ? ?Willeen Cass, PhD, FNP-BC ?  ?

## 2021-11-11 ENCOUNTER — Other Ambulatory Visit: Payer: Self-pay

## 2021-11-11 MED ORDER — FLUCONAZOLE 150 MG PO TABS
150.0000 mg | ORAL_TABLET | Freq: Once | ORAL | 0 refills | Status: AC
  Filled 2021-11-11: qty 1, 1d supply, fill #0

## 2021-11-11 NOTE — Addendum Note (Signed)
Addended by: Brunetta Jeans on: 11/11/2021 12:35 PM ? ? Modules accepted: Orders ? ?

## 2021-11-26 ENCOUNTER — Other Ambulatory Visit: Payer: Self-pay

## 2021-11-26 DIAGNOSIS — R350 Frequency of micturition: Secondary | ICD-10-CM | POA: Diagnosis not present

## 2021-11-26 MED ORDER — NITROFURANTOIN MONOHYD MACRO 100 MG PO CAPS
ORAL_CAPSULE | ORAL | 0 refills | Status: DC
  Filled 2021-11-26: qty 14, 7d supply, fill #0

## 2021-11-27 ENCOUNTER — Encounter: Payer: Self-pay | Admitting: Family

## 2021-11-27 NOTE — Telephone Encounter (Signed)
Pt called about mychart message being sent. Pt said she was sorry. She was acting like a fool and you was right. Pt want to go back on metformin ?

## 2021-11-28 ENCOUNTER — Encounter: Payer: Self-pay | Admitting: Family

## 2021-11-30 ENCOUNTER — Other Ambulatory Visit: Payer: Self-pay | Admitting: Family

## 2021-11-30 ENCOUNTER — Encounter: Payer: Self-pay | Admitting: Family

## 2021-12-01 ENCOUNTER — Other Ambulatory Visit (HOSPITAL_COMMUNITY): Payer: Self-pay

## 2021-12-01 ENCOUNTER — Other Ambulatory Visit: Payer: Self-pay

## 2021-12-01 ENCOUNTER — Telehealth: Payer: Self-pay

## 2021-12-01 ENCOUNTER — Encounter: Payer: Self-pay | Admitting: Family

## 2021-12-01 MED ORDER — LOSARTAN POTASSIUM 25 MG PO TABS
ORAL_TABLET | ORAL | 11 refills | Status: DC
  Filled 2021-12-01 – 2021-12-31 (×2): qty 30, 30d supply, fill #0
  Filled 2022-03-09: qty 30, 30d supply, fill #1
  Filled 2022-04-06: qty 30, 30d supply, fill #2
  Filled 2022-05-09: qty 90, 90d supply, fill #3
  Filled 2022-08-12: qty 90, 90d supply, fill #4
  Filled 2022-10-26: qty 90, 90d supply, fill #5

## 2021-12-01 MED ORDER — LOSARTAN POTASSIUM 25 MG PO TABS
ORAL_TABLET | ORAL | 1 refills | Status: DC
Start: 2021-12-01 — End: 2021-12-08
  Filled 2021-12-01: qty 75, 50d supply, fill #0

## 2021-12-01 NOTE — Telephone Encounter (Signed)
Lmtcb to office to get clarification of message that she left. ?

## 2021-12-01 NOTE — Telephone Encounter (Signed)
Spoke to patient about messages and scheduled appointment to follow up ?

## 2021-12-08 ENCOUNTER — Other Ambulatory Visit: Payer: Self-pay

## 2021-12-08 ENCOUNTER — Other Ambulatory Visit (HOSPITAL_COMMUNITY)
Admission: RE | Admit: 2021-12-08 | Discharge: 2021-12-08 | Disposition: A | Discharge status: Home / Self Care | Payer: 59 | Source: Ambulatory Visit | Attending: Family | Admitting: Family

## 2021-12-08 ENCOUNTER — Ambulatory Visit: Payer: 59 | Admitting: Family

## 2021-12-08 ENCOUNTER — Encounter: Payer: Self-pay | Admitting: Family

## 2021-12-08 ENCOUNTER — Telehealth: Payer: Self-pay | Admitting: Family

## 2021-12-08 VITALS — BP 116/64 | HR 78 | Temp 98.4°F | Ht 65.0 in

## 2021-12-08 DIAGNOSIS — E119 Type 2 diabetes mellitus without complications: Secondary | ICD-10-CM | POA: Diagnosis not present

## 2021-12-08 DIAGNOSIS — R300 Dysuria: Secondary | ICD-10-CM | POA: Insufficient documentation

## 2021-12-08 DIAGNOSIS — I10 Essential (primary) hypertension: Secondary | ICD-10-CM | POA: Diagnosis not present

## 2021-12-08 LAB — HM DIABETES EYE EXAM

## 2021-12-08 LAB — URINALYSIS, ROUTINE W REFLEX MICROSCOPIC
Bilirubin Urine: NEGATIVE
Hgb urine dipstick: NEGATIVE
Ketones, ur: NEGATIVE
Leukocytes,Ua: NEGATIVE
Nitrite: NEGATIVE
RBC / HPF: NONE SEEN (ref 0–?)
Specific Gravity, Urine: 1.01 (ref 1.000–1.030)
Total Protein, Urine: NEGATIVE
Urine Glucose: >=1000 — AB
Urobilinogen, UA: 0.2 (ref 0.0–1.0)
pH: 5.5 (ref 5.0–8.0)

## 2021-12-08 LAB — BASIC METABOLIC PANEL
BUN: 12 mg/dL (ref 6–23)
CO2: 29 mEq/L (ref 19–32)
Calcium: 9.1 mg/dL (ref 8.4–10.5)
Chloride: 106 mEq/L (ref 96–112)
Creatinine, Ser: 0.64 mg/dL (ref 0.40–1.20)
GFR: 98.1 mL/min (ref >60.00)
Glucose, Bld: 104 mg/dL — ABNORMAL HIGH (ref 70–99)
Potassium: 3.7 mEq/L (ref 3.5–5.1)
Sodium: 142 mEq/L (ref 135–145)

## 2021-12-08 MED ORDER — EMPAGLIFLOZIN 10 MG PO TABS
10.0000 mg | ORAL_TABLET | Freq: Every day | ORAL | 2 refills | Status: DC
Start: 2021-12-08 — End: 2022-02-10

## 2021-12-08 MED ORDER — PHENAZOPYRIDINE HCL 100 MG PO TABS
100.0000 mg | ORAL_TABLET | Freq: Three times a day (TID) | ORAL | 2 refills | Status: DC | PRN
  Filled 2021-12-08: qty 12, 4d supply, fill #0
  Filled 2022-03-19: qty 12, 4d supply, fill #1

## 2021-12-08 NOTE — Progress Notes (Signed)
? ?Subjective:  ? ? Patient ID: EMRI SAMPLE, female    DOB: 1966/06/10, 56 y.o.   MRN: [id number] ? ?CC: Alyssa Schultz is a 56 y.o. female who presents today for follow up.  ? ?HPI: Complains of continued urinary burning at the end of urination.  ? ?Dr Garwin Brothers gave her macrobid which she completed. No fever, flank pain.  ? ?She would like second opinion with endocrinologist. ? ? ? ?HTN- compliant with losartan 9m qd ? ?DM- she is no longer metformin or ozempic. She was off of all diabetic medications the month of march. Started jardiance April 4th. FBG 101 on average. Denies dizziness. Some vaginal itching. No unusual vaginal discharge. ? ? ? ?HISTORY:  ?Past Medical History:  ?Diagnosis Date  ? Complication of anesthesia   ? hard to wake in 2017, patient reports she is 30 lb lighter now  ? Diabetes mellitus   ? dx 2014   just take pills  ? Hyperlipidemia   ? Hypertension   ? Neuromuscular disorder [place]   ? ?Past Surgical History:  ?Procedure Laterality Date  ? abalsion    ? ablation    ? ANTERIOR CERVICAL DECOMP/DISCECTOMY FUSION N/A 09/19/2015  ? Procedure: ANTERIOR CERVICAL DECOMPRESSION/DISCECTOMY FUSION 2 LEVELS;  Surgeon: MPhylliss Bob MD;  [place] [facility]  Service: Orthopedics;  Laterality: N/A;  Anterior cervical decompression fusion, cervical 3-4, cervical 4-5 decompression with instrumentation and allograft  ? BACK SURGERY    ? COLONOSCOPY WITH PROPOFOL N/A 07/18/2021  ? Procedure: COLONOSCOPY WITH PROPOFOL;  Surgeon: TVirgel Manifold MD;  Location: [facility] ENDOSCOPY;  Service: Endoscopy;  Laterality: N/A;  ? FACIAL RECONSTRUCTION SURGERY    ? TRANSFORAMINAL LUMBAR INTERBODY FUSION (TLIF) WITH PEDICLE SCREW FIXATION 2 LEVEL Right 07/26/2019  ? Procedure: RIGHT-SIDED LUMBAR 5 - SACRUM 1 TRANSFORAMINAL LUMBAR INTERBODY FUSION WITH INSTRUMENTATION AND ALLOGRAFT;  Surgeon: DPhylliss Bob MD;  [place] [facility]  Service: Orthopedics;  Laterality: Right;  ? ?Family History  ?Problem Relation Age of Onset  ?  Diabetes Mother   ? Diabetes Maternal Grandmother   ? Hypertension Maternal Grandfather   ? Breast cancer Neg Hx   ? ? ?Allergies: Hemophilus b polysaccharide vaccine, Influenza vaccine live, Influenza vaccines, Hydrocodone, Morphine and related, and Prednisone ?Current Outpatient Medications on File Prior to Visit  ?Medication Sig Dispense Refill  ? aspirin EC 81 MG EC tablet Take 1 tablet (81 mg total) by mouth daily. 30 tablet 0  ? Blood Glucose Monitoring Suppl (FREESTYLE FREEDOM LITE) w/Device KIT     ? glucose blood (FREESTYLE LITE) test strip Used to check blood sugars once a day. 100 each 4  ? losartan (COZAAR) 25 MG tablet Take 1 tablet (25 mg total) by mouth once a day 30 tablet 11  ? meclizine (ANTIVERT) 25 MG tablet Take 2 tablets (50 mg total) by mouth 3 (three) times daily as needed for dizziness 60 tablet 1  ? meloxicam (MOBIC) 7.5 MG tablet Take One tab PO BID with food 28 tablet 0  ? ondansetron (ZOFRAN-ODT) 4 MG disintegrating tablet Take 1 tablet (4 mg total) by mouth every 8 (eight) hours as needed for nausea or vomiting. 30 tablet 0  ? rosuvastatin (CRESTOR) 20 MG tablet TAKE 1 TABLET BY MOUTH DAILY 90 tablet 1  ? TRUEPLUS LANCETS 30G MISC USE AS DIRECTED 100 each 5  ? valACYclovir (VALTREX) 500 MG tablet Take 2 tablets (1,000 mg total) by mouth 2 (two) times daily. Take prn cold sore on on  day 12 tablet 1  ? ?No current facility-administered medications on file prior to visit.  ? ? ?Social History  ? ?Tobacco Use  ? Smoking status: Never  ? Smokeless tobacco: Never  ?Vaping Use  ? Vaping Use: Never used  ?Substance Use Topics  ? Alcohol use: Yes  ?  Comment: Occasional wine  ? Drug use: No  ? ? ?Review of Systems  ?Constitutional:  Negative for chills and fever.  ?Respiratory:  Negative for cough.   ?Cardiovascular:  Negative for chest pain and palpitations.  ?Gastrointestinal:  Negative for nausea and vomiting.  ?Genitourinary:  Positive for dysuria. Negative for hematuria.  ?   ?Objective:  ?   ?BP 116/64 (BP Location: Left Arm, Patient Position: Sitting, Cuff Size: Large)   Pulse 78   Temp 98.4 ?F (36.9 ?C) (Oral)   Ht 5' 5"  (1.651 m)   LMP 07/24/2007 Comment: ablation in 2008-pt reports she went through menopause after this  SpO2 97%   BMI 27.31 kg/m?  ?BP Readings from Last 3 Encounters:  ?12/08/21 116/64  ?10/28/21 115/79  ?10/20/21 138/78  ? ?Wt Readings from Last 3 Encounters:  ?10/20/21 164 lb 1.6 oz (74.4 kg)  ?07/18/21 156 lb 11.2 oz (71.1 kg)  ?06/23/21 163 lb 3.2 oz (74 kg)  ? ? ?Physical Exam ?Vitals reviewed.  ?Constitutional:   ?   Appearance: She is well-developed.  ?Cardiovascular:  ?   Rate and Rhythm: Normal rate and regular rhythm.  ?   Pulses: Normal pulses.  ?   Heart sounds: Normal heart sounds.  ?Pulmonary:  ?   Effort: Pulmonary effort is normal.  ?   Breath sounds: Normal breath sounds. No wheezing, rhonchi or rales.  ?Skin: ?   General: Skin is warm and dry.  ?Neurological:  ?   Mental Status: She is alert.  ?Psychiatric:     ?   Speech: Speech normal.     ?   Behavior: Behavior normal.     ?   Thought Content: Thought content normal.  ? ? ?   ?Assessment & Plan:  ? ?Problem List Items Addressed This Visit   ? ?  ? Cardiovascular and Mediastinum  ? Hypertension  ?  Chronic, stable. Continue losartan 32m.  ? ?  ?  ?  ? Endocrine  ? Diabetes mellitus type 2, controlled ([place]  ?  Excellent control based on FBG. Will continue jardiance 119m Counseled on dehydration and to let me know if any volume losses as we may suspend jardiance in that case.  ?Pending urine.  ? ?  ?  ? Relevant Medications  ? empagliflozin (JARDIANCE) 10 MG TABS tablet  ? Other Relevant Orders  ? Ambulatory referral to Endocrinology  ? Microalbumin / creatinine urine ratio  ?  ? Other  ? Dysuria - Primary  ?  Symptoms persist. Completed macrobid. Pending urine studies, wet prep. Provided pyridium to use prn. ? ?  ?  ? Relevant Medications  ? phenazopyridine (PYRIDIUM) 100 MG tablet  ? Other Relevant  Orders  ? Urine cytology ancillary only([place])  ? Basic metabolic panel  ? Urinalysis, Routine w reflex microscopic  ? Urine Culture  ? Cervicovaginal ancillary only  ? ? ? ?I have discontinued LiCristopher PeruTorain's sulfamethoxazole-trimethoprim and nitrofurantoin (macrocrystal-monohydrate). I have also changed her empagliflozin. Additionally, I am having her start on phenazopyridine. Lastly, I am having her maintain her TRUEplus Lancets 30G, aspirin, valACYclovir, FreeStyle Freedom Lite, FREESTYLE LITE, meclizine, rosuvastatin, ondansetron, meloxicam,  and losartan. ? ? ?Meds ordered this encounter  ?Medications  ? empagliflozin (JARDIANCE) 10 MG TABS tablet  ?  Sig: Take 1 tablet (10 mg total) by mouth daily.  ?  Dispense:  90 tablet  ?  Refill:  2  ?  Order Specific Question:   Supervising Provider  ?  Answer:   Crecencio Mc [[id number]]  ? phenazopyridine (PYRIDIUM) 100 MG tablet  ?  Sig: Take 1 tablet (100 mg total) by mouth 3 (three) times daily as needed for pain. Use for 2 days as symptoms should then be resolved  ?  Dispense:  12 tablet  ?  Refill:  2  ?  Order Specific Question:   Supervising Provider  ?  Answer:   Crecencio Mc [[id number]]  ? ? ?Return precautions given.  ? ?Risks, benefits, and alternatives of the medications and treatment plan prescribed today were discussed, and patient expressed understanding.  ? ?Education regarding symptom management and diagnosis given to patient on AVS. ? ?Continue to follow with Burnard Hawthorne, FNP for routine health maintenance.  ? ?Cephus Richer and I agreed with plan.  ? ?Mable Paris, FNP ? ? ?

## 2021-12-08 NOTE — Assessment & Plan Note (Signed)
Symptoms persist. Completed macrobid. Pending urine studies, wet prep. Provided pyridium to use prn. ?

## 2021-12-08 NOTE — Patient Instructions (Signed)
Continue jardiance ?I have also sent in pyridium to be used as needed typically 1-2 days for bladder pain, spasm. Will call with urine results ? ?

## 2021-12-08 NOTE — Telephone Encounter (Signed)
Pt requesting for lab orders... No lab orders in system... Pt requesting A1C... Pt requesting callback ?

## 2021-12-08 NOTE — Telephone Encounter (Signed)
I have ordered and sent patient & mychart message.  ?

## 2021-12-08 NOTE — Assessment & Plan Note (Signed)
Chronic, stable.  Continue losartan 25 mg 

## 2021-12-08 NOTE — Assessment & Plan Note (Signed)
Excellent control based on FBG. Will continue jardiance '10mg'$ . Counseled on dehydration and to let me know if any volume losses as we may suspend jardiance in that case.  ?Pending urine.  ?

## 2021-12-09 LAB — MICROALBUMIN / CREATININE URINE RATIO
Creatinine,U: 38.8 mg/dL
Microalb Creat Ratio: 2.3 mg/g (ref 0.0–30.0)
Microalb, Ur: 0.9 mg/dL (ref 0.0–1.9)

## 2021-12-10 LAB — CERVICOVAGINAL ANCILLARY ONLY
Bacterial Vaginitis (gardnerella): NEGATIVE
Candida Glabrata: NEGATIVE
Candida Vaginitis: NEGATIVE
Comment: NEGATIVE
Comment: NEGATIVE
Comment: NEGATIVE

## 2021-12-12 ENCOUNTER — Encounter: Payer: Self-pay | Admitting: Family

## 2021-12-12 NOTE — Telephone Encounter (Signed)
Faxed paperwork to [facility] ?

## 2021-12-12 NOTE — Telephone Encounter (Signed)
Patient called to give fax number for [place], [phone]. Please resend recent labs and office notes, and reason for coming. ? ?  ?Alyssa Schultz  P [facility] (supporting Burnard Hawthorne, FNP) 38 minutes ago (3:12 PM) ? ?Hey Dr. Baird Lyons [place] office said they got a lot of notes  but none on my condition.  Can my H&P be sent.. They are waiting on all notes so I can get appointment.  ?Thanks ?  ? ? ? ?

## 2021-12-15 ENCOUNTER — Other Ambulatory Visit: Payer: Self-pay

## 2021-12-15 MED ORDER — FREESTYLE FREEDOM LITE W/DEVICE KIT
PACK | 0 refills | Status: AC
Start: 2021-12-15 — End: ?
  Filled 2021-12-15: qty 1, 30d supply, fill #0

## 2021-12-17 ENCOUNTER — Other Ambulatory Visit: Payer: Self-pay

## 2021-12-17 DIAGNOSIS — R809 Proteinuria, unspecified: Secondary | ICD-10-CM

## 2021-12-17 DIAGNOSIS — E119 Type 2 diabetes mellitus without complications: Secondary | ICD-10-CM

## 2021-12-18 ENCOUNTER — Other Ambulatory Visit: Payer: Self-pay

## 2021-12-18 ENCOUNTER — Telehealth: Payer: Self-pay

## 2021-12-18 ENCOUNTER — Telehealth: Payer: Self-pay | Admitting: Gastroenterology

## 2021-12-18 DIAGNOSIS — Z86010 Personal history of colonic polyps: Secondary | ICD-10-CM

## 2021-12-18 MED ORDER — NA SULFATE-K SULFATE-MG SULF 17.5-3.13-1.6 GM/177ML PO SOLN
2.0000 | Freq: Every day | ORAL | 0 refills | Status: AC
  Filled 2021-12-18: qty 708, 2d supply, fill #0

## 2021-12-18 NOTE — Telephone Encounter (Signed)
This is a repeat colonoscopy can you schedule  ?

## 2021-12-18 NOTE — Telephone Encounter (Signed)
2-day prep would still be okay for diabetes.  She has to monitor her blood sugars and she does regularly.  She will be on clear liquid diet which would help to maintain her glucose levels so she will not have to worry about it.  She can do any prep that is covered under her insurance ? ?Alyssa Schultz ?

## 2021-12-18 NOTE — Telephone Encounter (Signed)
Patient needs a repeat colonoscopy. States that she has diabetes and has a few questions first. Requesting a call back. ?

## 2021-12-18 NOTE — Telephone Encounter (Signed)
Patient contacted office to express concerns about doing a 2 day prep for her colonoscopy. ?Her last colonoscopy was performed by Dr. Bonna Gains 07/18/21.  Dr. Bonna Gains advised her to do a 2 day prep because her colon was not clean. She stated she had precancerous polyps and does not want to wait beyond June to schedule. But she is is concerned because she is a diabetic. Currently on Ozempic.  One of her labs indicated that she has 1,'0000mg'$  of sugar in her urine.  Her colonoscopy is due for repeat in June. Do you think doing a 2 day colonoscopy prep will cause complications with her diabetes, and how would you recommend her prep since she is diabetic. ? ?Please advise. ? ?Thanks, ?Alyssa Schultz, CMA ?

## 2021-12-18 NOTE — Telephone Encounter (Signed)
Gastroenterology Pre-Procedure Review ? ?Request Date: 01/26/22 ?Requesting Physician: Dr. Marius Ditch ? ?PATIENT REVIEW QUESTIONS: The patient responded to the following health history questions as indicated:   ? ?1. Are you having any GI issues? no ?2. Do you have a personal history of Polyps? yes (polyps noted on colonoscopy performed by Dr. Bonna Gains 07/18/21) ?3. Do you have a family history of Colon Cancer or Polyps? no ?4. Diabetes Mellitus? yes (type 2) ?5. Joint replacements in the past 12 months?no ?6. Major health problems in the past 3 months?no ?7. Any artificial heart valves, MVP, or defibrillator?no ?   ?MEDICATIONS & ALLERGIES:    ?Patient reports the following regarding taking any anticoagulation/antiplatelet therapy:   ?Plavix, Coumadin, Eliquis, Xarelto, Lovenox, Pradaxa, Brilinta, or Effient? no ?Aspirin? yes (82m) ? ?Patient confirms/reports the following medications:  ?Current Outpatient Medications  ?Medication Sig Dispense Refill  ? aspirin EC 81 MG EC tablet Take 1 tablet (81 mg total) by mouth daily. 30 tablet 0  ? Blood Glucose Monitoring Suppl (FREESTYLE FREEDOM LITE) w/Device KIT     ? Blood Glucose Monitoring Suppl (FREESTYLE FREEDOM LITE) w/Device KIT Use as directed 1 kit 0  ? empagliflozin (JARDIANCE) 10 MG TABS tablet Take 1 tablet (10 mg total) by mouth daily. 90 tablet 2  ? glucose blood (FREESTYLE LITE) test strip Used to check blood sugars once a day. 100 each 4  ? losartan (COZAAR) 25 MG tablet Take 1 tablet (25 mg total) by mouth once a day 30 tablet 11  ? meclizine (ANTIVERT) 25 MG tablet Take 2 tablets (50 mg total) by mouth 3 (three) times daily as needed for dizziness 60 tablet 1  ? meloxicam (MOBIC) 7.5 MG tablet Take One tab PO BID with food 28 tablet 0  ? ondansetron (ZOFRAN-ODT) 4 MG disintegrating tablet Take 1 tablet (4 mg total) by mouth every 8 (eight) hours as needed for nausea or vomiting. 30 tablet 0  ? phenazopyridine (PYRIDIUM) 100 MG tablet Take 1 tablet (100 mg  total) by mouth 3 (three) times daily as needed for pain. Use for 2 days as symptoms should then be resolved 12 tablet 2  ? rosuvastatin (CRESTOR) 20 MG tablet TAKE 1 TABLET BY MOUTH DAILY 90 tablet 1  ? TRUEPLUS LANCETS 30G MISC USE AS DIRECTED 100 each 5  ? valACYclovir (VALTREX) 500 MG tablet Take 2 tablets (1,000 mg total) by mouth 2 (two) times daily. Take prn cold sore on on day 12 tablet 1  ? ?No current facility-administered medications for this visit.  ? ? ?Patient confirms/reports the following allergies:  ?Allergies  ?Allergen Reactions  ? Hemophilus B Polysaccharide Vaccine   ?  Per Dr - Pt is to not receive vaccine   ? Influenza Vaccine Live   ?  Per Dr - Pt is to not receive vaccine   ? Influenza Vaccines Other (See Comments)  ?  Per Dr - Pt is to not receive vaccine   ? Hydrocodone Rash  ? Morphine And Related Other (See Comments)  ?  Makes her crazy  ? Prednisone   ?  "makes me crazy"  ? ? ?No orders of the defined types were placed in this encounter. ? ? ?AUTHORIZATION INFORMATION ?Primary Insurance: ?1D#: ?Group #: ? ?Secondary Insurance: ?1D#: ?Group #: ? ?SCHEDULE INFORMATION: ?Date: 01/26/22 ?Time: ?Location: [facility] ?

## 2021-12-19 ENCOUNTER — Other Ambulatory Visit: Payer: Self-pay

## 2021-12-31 ENCOUNTER — Other Ambulatory Visit: Payer: Self-pay | Admitting: Family

## 2021-12-31 ENCOUNTER — Other Ambulatory Visit: Payer: Self-pay

## 2021-12-31 DIAGNOSIS — E785 Hyperlipidemia, unspecified: Secondary | ICD-10-CM

## 2021-12-31 MED ORDER — ROSUVASTATIN CALCIUM 20 MG PO TABS
ORAL_TABLET | Freq: Every day | ORAL | 1 refills | Status: DC
  Filled 2021-12-31: qty 90, 90d supply, fill #0
  Filled 2022-03-09: qty 90, 90d supply, fill #1

## 2022-01-01 ENCOUNTER — Other Ambulatory Visit: Payer: Self-pay

## 2022-01-05 ENCOUNTER — Encounter: Payer: Self-pay | Admitting: Family

## 2022-01-07 ENCOUNTER — Other Ambulatory Visit: Payer: Self-pay

## 2022-01-07 MED ORDER — DEXCOM G7 SENSOR MISC: 0 refills | Status: DC

## 2022-01-07 MED ORDER — DEXCOM G7 SENSOR MISC
3 refills | Status: DC
  Filled 2022-01-07: qty 3, 30d supply, fill #0

## 2022-01-13 ENCOUNTER — Other Ambulatory Visit: Payer: Self-pay

## 2022-01-14 ENCOUNTER — Other Ambulatory Visit
Admission: RE | Admit: 2022-01-14 | Discharge: 2022-01-14 | Disposition: A | Discharge status: Home / Self Care | Payer: 59 | Source: Ambulatory Visit | Attending: Family | Admitting: Family

## 2022-01-14 ENCOUNTER — Other Ambulatory Visit: Payer: Self-pay

## 2022-01-14 DIAGNOSIS — E119 Type 2 diabetes mellitus without complications: Secondary | ICD-10-CM | POA: Insufficient documentation

## 2022-01-14 DIAGNOSIS — R809 Proteinuria, unspecified: Secondary | ICD-10-CM | POA: Insufficient documentation

## 2022-01-14 LAB — URINALYSIS, ROUTINE W REFLEX MICROSCOPIC
Bacteria, UA: NONE SEEN
Bilirubin Urine: NEGATIVE
Glucose, UA: >=500 mg/dL — AB
Hgb urine dipstick: NEGATIVE
Ketones, ur: NEGATIVE mg/dL
Leukocytes,Ua: NEGATIVE
Nitrite: NEGATIVE
Protein, ur: NEGATIVE mg/dL
Specific Gravity, Urine: 1.014 (ref 1.005–1.030)
pH: 5 (ref 5.0–8.0)

## 2022-01-15 ENCOUNTER — Telehealth: Payer: Self-pay

## 2022-01-15 ENCOUNTER — Encounter: Payer: Self-pay | Admitting: Family

## 2022-01-15 NOTE — Telephone Encounter (Signed)
LMTCB to office to inquire about Dexacom?

## 2022-01-19 ENCOUNTER — Telehealth: Payer: Self-pay

## 2022-01-19 ENCOUNTER — Other Ambulatory Visit: Payer: Self-pay

## 2022-01-19 ENCOUNTER — Encounter: Payer: Self-pay | Admitting: Family

## 2022-01-19 ENCOUNTER — Other Ambulatory Visit: Payer: Self-pay | Admitting: Family

## 2022-01-19 NOTE — Telephone Encounter (Signed)
Patient returned my call about the [facility] and stated that she did have it already and she really likes the way that it works and she would like to keep it but her insurance may not cover it . She is waiting to hear back from them now to see what she will be able to get through her insurance

## 2022-01-20 ENCOUNTER — Other Ambulatory Visit: Payer: Self-pay

## 2022-01-22 ENCOUNTER — Other Ambulatory Visit: Payer: Self-pay

## 2022-01-26 ENCOUNTER — Ambulatory Visit
Admission: RE | Admit: 2022-01-26 | Discharge: 2022-01-26 | Disposition: A | Discharge status: Home / Self Care | Payer: 59 | Attending: Gastroenterology | Admitting: Gastroenterology

## 2022-01-26 ENCOUNTER — Ambulatory Visit: Payer: 59 | Admitting: Certified Registered"

## 2022-01-26 ENCOUNTER — Encounter
Admission: RE | Disposition: A | Discharge status: Home / Self Care | Payer: Self-pay | Source: Home / Self Care | Attending: Gastroenterology

## 2022-01-26 ENCOUNTER — Telehealth: Payer: Self-pay

## 2022-01-26 DIAGNOSIS — Z8673 Personal history of transient ischemic attack (TIA), and cerebral infarction without residual deficits: Secondary | ICD-10-CM | POA: Insufficient documentation

## 2022-01-26 DIAGNOSIS — K635 Polyp of colon: Secondary | ICD-10-CM | POA: Diagnosis not present

## 2022-01-26 DIAGNOSIS — Z86010 Personal history of colonic polyps: Secondary | ICD-10-CM | POA: Insufficient documentation

## 2022-01-26 DIAGNOSIS — I10 Essential (primary) hypertension: Secondary | ICD-10-CM | POA: Insufficient documentation

## 2022-01-26 DIAGNOSIS — Z7984 Long term (current) use of oral hypoglycemic drugs: Secondary | ICD-10-CM | POA: Insufficient documentation

## 2022-01-26 DIAGNOSIS — Z1211 Encounter for screening for malignant neoplasm of colon: Secondary | ICD-10-CM | POA: Insufficient documentation

## 2022-01-26 DIAGNOSIS — D125 Benign neoplasm of sigmoid colon: Secondary | ICD-10-CM | POA: Diagnosis not present

## 2022-01-26 DIAGNOSIS — E119 Type 2 diabetes mellitus without complications: Secondary | ICD-10-CM | POA: Diagnosis not present

## 2022-01-26 HISTORY — PX: COLONOSCOPY WITH PROPOFOL: SHX5780

## 2022-01-26 LAB — GLUCOSE, CAPILLARY: Glucose-Capillary: 89 mg/dL (ref 70–99)

## 2022-01-26 SURGERY — COLONOSCOPY WITH PROPOFOL
Anesthesia: General

## 2022-01-26 MED ORDER — SODIUM CHLORIDE 0.9 % IV SOLN
INTRAVENOUS | Status: DC
  Administered 2022-01-26: 20 mL/h via INTRAVENOUS

## 2022-01-26 MED ORDER — LIDOCAINE HCL (CARDIAC) PF 100 MG/5ML IV SOSY
PREFILLED_SYRINGE | INTRAVENOUS | Status: DC | PRN
  Administered 2022-01-26: 50 mg via INTRAVENOUS

## 2022-01-26 MED ORDER — PROPOFOL 1000 MG/100ML IV EMUL
INTRAVENOUS | Status: AC
  Filled 2022-01-26: qty 100

## 2022-01-26 MED ORDER — FENTANYL CITRATE (PF) 100 MCG/2ML IJ SOLN
INTRAMUSCULAR | Status: AC
  Filled 2022-01-26: qty 2

## 2022-01-26 MED ORDER — FENTANYL CITRATE (PF) 100 MCG/2ML IJ SOLN
INTRAMUSCULAR | Status: DC | PRN
  Administered 2022-01-26: 25 ug via INTRAVENOUS

## 2022-01-26 MED ORDER — PROPOFOL 10 MG/ML IV BOLUS
INTRAVENOUS | Status: DC | PRN
  Administered 2022-01-26: 50 mg via INTRAVENOUS
  Administered 2022-01-26: 150 ug/kg/min via INTRAVENOUS

## 2022-01-26 MED ORDER — SODIUM CHLORIDE 0.9 % IV SOLN: INTRAVENOUS | Status: DC | PRN

## 2022-01-26 NOTE — Op Note (Signed)
[facility] Gastroenterology Patient Name: Alyssa Schultz Procedure Date: 01/26/2022 8:31 AM MRN: [id number] Account #: [id number] Date of Birth: August 24, 1965 Admit Type: Outpatient Age: 56 Room: [facility] ENDO ROOM 4 Gender: Female Note Status: Finalized Instrument Name: Jasper Riling [id number] Procedure:             Colonoscopy Indications:           Surveillance: Personal history of colonic polyps with                         unknown histology on last colonoscopy (inadequate                         bowel preparation) less than 3 years ago Providers:             Lin Landsman MD, MD Referring MD:          Yvetta Coder. Arnett (Referring MD) Medicines:             General Anesthesia Complications:         No immediate complications. Estimated blood loss: None. Procedure:             Pre-Anesthesia Assessment:                        - Prior to the procedure, a History and Physical was                         performed, and patient medications and allergies were                         reviewed. The patient is competent. The risks and                         benefits of the procedure and the sedation options and                         risks were discussed with the patient. All questions                         were answered and informed consent was obtained.                         Patient identification and proposed procedure were                         verified by the physician, the nurse, the                         anesthesiologist, the anesthetist and the technician                         in the pre-procedure area in the procedure room in the                         endoscopy suite. Mental Status Examination: alert and                         oriented. Airway Examination: normal oropharyngeal  airway and neck mobility. Respiratory Examination:                         clear to auscultation. CV Examination: normal.                          Prophylactic Antibiotics: The patient does not require                         prophylactic antibiotics. Prior Anticoagulants: The                         patient has taken no previous anticoagulant or                         antiplatelet agents. ASA Grade Assessment: III - A                         patient with severe systemic disease. After reviewing                         the risks and benefits, the patient was deemed in                         satisfactory condition to undergo the procedure. The                         anesthesia plan was to use general anesthesia.                         Immediately prior to administration of medications,                         the patient was re-assessed for adequacy to receive                         sedatives. The heart rate, respiratory rate, oxygen                         saturations, blood pressure, adequacy of pulmonary                         ventilation, and response to care were monitored                         throughout the procedure. The physical status of the                         patient was re-assessed after the procedure.                        After obtaining informed consent, the colonoscope was                         passed under direct vision. Throughout the procedure,                         the patient's blood pressure, pulse, and oxygen  saturations were monitored continuously. The                         Colonoscope was introduced through the anus and                         advanced to the the cecum, identified by appendiceal                         orifice and ileocecal valve. The colonoscopy was                         performed without difficulty. The patient tolerated                         the procedure well. The quality of the bowel                         preparation was evaluated using the BBPS [facility] Bowel                         Preparation Scale) with scores of: Right Colon = 3,                          Transverse Colon = 3 and Left Colon = 3 (entire mucosa                         seen well with no residual staining, small fragments                         of stool or opaque liquid). The total BBPS score                         equals 9. Findings:      The perianal and digital rectal examinations were normal. Pertinent       negatives include normal sphincter tone and no palpable rectal lesions.      A 4 mm polyp was found in the sigmoid colon. The polyp was sessile. The       polyp was removed with a cold snare. Resection and retrieval were       complete. To prevent bleeding after the polypectomy, one hemostatic clip       was successfully placed (MR conditional). There was no bleeding during,       or at the end, of the procedure. Estimated blood loss was minimal.      The retroflexed view of the distal rectum and anal verge was normal and       showed no anal or rectal abnormalities. Impression:            - One 4 mm polyp in the sigmoid colon, removed with a                         cold snare. Resected and retrieved. Clip (MR                         conditional) was placed.                        -  The distal rectum and anal verge are normal on                         retroflexion view. Recommendation:        - Discharge patient to home (with escort).                        - Resume previous diet today.                        - Continue present medications.                        - Await pathology results.                        - Repeat colonoscopy in 7 years for surveillance. Procedure Code(s):     --- Professional ---                        [phone], Colonoscopy, flexible; with removal of                         tumor(s), polyp(s), or other lesion(s) by snare                         technique Diagnosis Code(s):     --- Professional ---                        Z86.010, Personal history of colonic polyps                        K63.5, Polyp of colon CPT copyright  2019 American Medical Association. All rights reserved. The codes documented in this report are preliminary and upon coder review may  be revised to meet current compliance requirements. Dr. Ulyess Mort Lin Landsman MD, MD 01/26/2022 9:06:29 AM This report has been signed electronically. Number of Addenda: 0 Note Initiated On: 01/26/2022 8:31 AM Scope Withdrawal Time: 0 hours 10 minutes 48 seconds  Total Procedure Duration: 0 hours 14 minutes 5 seconds  Estimated Blood Loss:  Estimated blood loss: none.      [facility]

## 2022-01-26 NOTE — Transfer of Care (Signed)
Immediate Anesthesia Transfer of Care Note  Patient: Alyssa Schultz  Procedure(s) Performed: COLONOSCOPY WITH PROPOFOL  Patient Location: PACU and Endoscopy Unit  Anesthesia Type:MAC  Level of Consciousness: drowsy  Airway & Oxygen Therapy: Patient Spontanous Breathing, Patient connected to nasal cannula oxygen and aerosol face mask  Post-op Assessment: Report given to RN and Post -op Vital signs reviewed and stable  Post vital signs: Reviewed and stable  Last Vitals:  Vitals Value Taken Time  BP    Temp    Pulse    Resp    SpO2      Last Pain:  Vitals:   01/26/22 0802  TempSrc: Temporal  PainSc: 0-No pain         Complications: No notable events documented.

## 2022-01-26 NOTE — Anesthesia Postprocedure Evaluation (Signed)
Anesthesia Post Note  Patient: Alyssa Schultz  Procedure(s) Performed: COLONOSCOPY WITH PROPOFOL  Patient location during evaluation: Endoscopy Anesthesia Type: General Level of consciousness: awake and alert Pain management: pain level controlled Vital Signs Assessment: post-procedure vital signs reviewed and stable Respiratory status: spontaneous breathing, nonlabored ventilation, respiratory function stable and patient connected to nasal cannula oxygen Cardiovascular status: blood pressure returned to baseline and stable Postop Assessment: no apparent nausea or vomiting Anesthetic complications: no   No notable events documented.   Last Vitals:  Vitals:   01/26/22 0918 01/26/22 0928  BP: 110/78 118/76  Pulse: (!) 57 68  Resp: 14 15  Temp:    SpO2: 100% 100%    Last Pain:  Vitals:   01/26/22 0928  TempSrc:   PainSc: 0-No pain                 Precious Haws Damek Ende

## 2022-01-26 NOTE — Addendum Note (Signed)
Addendum  created 01/26/22 0952 by Beverely Low, CRNA   Intraprocedure Event edited

## 2022-01-26 NOTE — Telephone Encounter (Signed)
LMTCB TO OFFICE to schedule appointment for labs

## 2022-01-26 NOTE — H&P (Signed)
Alyssa Darby, MD [address]  [place]  [place], [place] [address]  Main: [phone]  Fax: [phone] Pager: [phone]  Primary Care Physician:  Burnard Hawthorne, FNP Primary Gastroenterologist:  Dr. Cephas Schultz  Pre-Procedure History & Physical: HPI:  Alyssa Schultz is a 56 y.o. female is here for an colonoscopy.   Past Medical History:  Diagnosis Date   Complication of anesthesia    hard to wake in 2017, patient reports she is 30 lb lighter now   Diabetes mellitus    dx 2014   just take pills   Hyperlipidemia    Hypertension    Neuromuscular disorder [facility])     Past Surgical History:  Procedure Laterality Date   abalsion     ablation     ANTERIOR CERVICAL DECOMP/DISCECTOMY FUSION N/A 09/19/2015   Procedure: ANTERIOR CERVICAL DECOMPRESSION/DISCECTOMY FUSION 2 LEVELS;  Surgeon: Phylliss Bob, MD;  Location: [place];  Service: Orthopedics;  Laterality: N/A;  Anterior cervical decompression fusion, cervical 3-4, cervical 4-5 decompression with instrumentation and allograft   BACK SURGERY     COLONOSCOPY WITH PROPOFOL N/A 07/18/2021   Procedure: COLONOSCOPY WITH PROPOFOL;  Surgeon: Virgel Manifold, MD;  Location: [facility];  Service: Endoscopy;  Laterality: N/A;   FACIAL RECONSTRUCTION SURGERY     TRANSFORAMINAL LUMBAR INTERBODY FUSION (TLIF) WITH PEDICLE SCREW FIXATION 2 LEVEL Right 07/26/2019   Procedure: RIGHT-SIDED LUMBAR 5 - SACRUM 1 TRANSFORAMINAL LUMBAR INTERBODY FUSION WITH INSTRUMENTATION AND ALLOGRAFT;  Surgeon: Phylliss Bob, MD;  Location: [place];  Service: Orthopedics;  Laterality: Right;    Prior to Admission medications   Medication Sig Start Date End Date Taking? Authorizing Provider  aspirin EC 81 MG EC tablet Take 1 tablet (81 mg total) by mouth daily. 09/06/19   Thornell Mule, MD  Blood Glucose Monitoring Suppl (FREESTYLE FREEDOM LITE) w/Device KIT  10/17/19   [provider]  Blood Glucose Monitoring Suppl (FREESTYLE FREEDOM  LITE) w/Device KIT Use as directed 12/15/21   Burnard Hawthorne, FNP  Continuous Blood Gluc Sensor (DEXCOM G7 SENSOR) MISC Apply one sensor every 10 days to check blood glucose. 01/07/22   Burnard Hawthorne, FNP  empagliflozin (JARDIANCE) 10 MG TABS tablet Take 1 tablet (10 mg total) by mouth daily. 12/08/21   Burnard Hawthorne, FNP  glucose blood (FREESTYLE LITE) test strip Used to check blood sugars once a day. 01/09/21   Burnard Hawthorne, FNP  losartan (COZAAR) 25 MG tablet Take 1 tablet (25 mg total) by mouth once a day 12/01/21     meclizine (ANTIVERT) 25 MG tablet Take 2 tablets (50 mg total) by mouth 3 (three) times daily as needed for dizziness 03/21/21   Burnard Hawthorne, FNP  meloxicam (MOBIC) 7.5 MG tablet Take One tab PO BID with food 09/08/21     ondansetron (ZOFRAN-ODT) 4 MG disintegrating tablet Take 1 tablet (4 mg total) by mouth every 8 (eight) hours as needed for nausea or vomiting. 08/20/21   Burnard Hawthorne, FNP  phenazopyridine (PYRIDIUM) 100 MG tablet Take 1 tablet (100 mg total) by mouth 3 (three) times daily as needed for pain. Use for 2 days as symptoms should then be resolved 12/08/21   Burnard Hawthorne, FNP  rosuvastatin (CRESTOR) 20 MG tablet TAKE 1 TABLET BY MOUTH DAILY 12/31/21 12/31/22  Burnard Hawthorne, FNP  TRUEPLUS LANCETS 30G MISC USE AS DIRECTED 03/06/16   Jackolyn Confer, MD  valACYclovir (VALTREX) 500 MG tablet Take 2  tablets (1,000 mg total) by mouth 2 (two) times daily. Take prn cold sore on on day 10/09/19   Juline Patch, MD    Allergies as of 12/18/2021 - Review Complete 12/08/2021  Allergen Reaction Noted   Haemophilus b polysaccharide vaccine  05/21/2011   Influenza vaccine live  05/21/2011   Influenza vaccines Other (See Comments) 05/21/2011   Hydrocodone Rash 09/06/2015   Morphine and related Other (See Comments) 09/06/2015   Prednisone  09/19/2015    Family History  Problem Relation Age of Onset   Diabetes Mother    Diabetes Maternal  Grandmother    Hypertension Maternal Grandfather    Breast cancer Neg Hx     Social History   Socioeconomic History   Marital status: Divorced    Spouse name: Not on file   Number of children: Not on file   Years of education: Not on file   Highest education level: Not on file  Occupational History   Not on file  Tobacco Use   Smoking status: Never   Smokeless tobacco: Never  Vaping Use   Vaping Use: Never used  Substance and Sexual Activity   Alcohol use: Yes    Comment: Occasional wine   Drug use: No   Sexual activity: Yes  Other Topics Concern   Not on file  Social History Narrative   Works for [employer], 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[employer]; doing PT for cancer center            Social Determinants of Health   Financial Resource Strain: Not on file  Food Insecurity: Not on file  Transportation Needs: Not on file  Physical Activity: Not on file  Stress: Not on file  Social Connections: Not on file  Intimate Partner Violence: Not on file    Review of Systems: See HPI, otherwise negative ROS  Physical Exam: BP 121/81   Pulse 66   Temp (!) 97.2 F (36.2 C) (Temporal)   Resp 20   Ht 5' 5.5" (1.664 m)   Wt 68 kg   LMP 07/24/2007 Comment: ablation in 2008-pt reports she went through menopause after this  SpO2 100%   BMI 24.58 kg/m  General:   Alert,  pleasant and cooperative in NAD Head:  Normocephalic and atraumatic. Neck:  Supple; no masses or thyromegaly. Lungs:  Clear throughout to auscultation.    Heart:  Regular rate and rhythm. Abdomen:  Soft, nontender and nondistended. Normal bowel sounds, without guarding, and without rebound.   Neurologic:  Alert and  oriented x4;  grossly normal neurologically.  Impression/Plan: Alyssa Schultz is here for an colonoscopy to be performed for h/o colon polyps  Risks, benefits, limitations, and alternatives regarding  colonoscopy have been reviewed with the patient.  Questions  have been answered.  All parties agreeable.   Sherri Sear, MD  01/26/2022, 8:30 AM

## 2022-01-26 NOTE — Anesthesia Preprocedure Evaluation (Signed)
Anesthesia Evaluation  Patient identified by MRN, date of birth, ID band Patient awake    Reviewed: Allergy & Precautions, NPO status , Patient's Chart, lab work & pertinent test results  History of Anesthesia Complications (+) PROLONGED EMERGENCE and history of anesthetic complications  Airway Mallampati: III  TM Distance: >3 FB Neck ROM: full    Dental  (+) Chipped   Pulmonary neg pulmonary ROS, neg shortness of breath,    Pulmonary exam normal        Cardiovascular Exercise Tolerance: Good hypertension, + angina (-) Past MI Normal cardiovascular exam     Neuro/Psych TIA Neuromuscular disease negative psych ROS   GI/Hepatic negative GI ROS, Neg liver ROS,   Endo/Other  diabetes, Type 2  Renal/GU negative Renal ROS  negative genitourinary   Musculoskeletal   Abdominal   Peds  Hematology negative hematology ROS (+)   Anesthesia Other Findings Past Medical History: No date: Complication of anesthesia     Comment:  hard to wake in 2017, patient reports she is 30 lb               lighter now No date: Diabetes mellitus     Comment:  dx 2014   just take pills No date: Hyperlipidemia No date: Hypertension No date: Neuromuscular disorder [facility])  Past Surgical History: No date: abalsion No date: ablation 09/19/2015: ANTERIOR CERVICAL DECOMP/DISCECTOMY FUSION; N/A     Comment:  Procedure: ANTERIOR CERVICAL DECOMPRESSION/DISCECTOMY               FUSION 2 LEVELS;  Surgeon: Phylliss Bob, MD;  Location:               [place];  Service: Orthopedics;  Laterality: N/A;  Anterior              cervical decompression fusion, cervical 3-4, cervical 4-5              decompression with instrumentation and allograft No date: BACK SURGERY 07/18/2021: COLONOSCOPY WITH PROPOFOL; N/A     Comment:  Procedure: COLONOSCOPY WITH PROPOFOL;  Surgeon:               Virgel Manifold, MD;  Location: [facility];                Service:  Endoscopy;  Laterality: N/A; No date: FACIAL RECONSTRUCTION SURGERY 07/26/2019: TRANSFORAMINAL LUMBAR INTERBODY FUSION (TLIF) WITH PEDICLE  SCREW FIXATION 2 LEVEL; Right     Comment:  Procedure: RIGHT-SIDED LUMBAR 5 - SACRUM 1               TRANSFORAMINAL LUMBAR INTERBODY FUSION WITH               INSTRUMENTATION AND ALLOGRAFT;  Surgeon: Phylliss Bob,               MD;  Location: [place];  Service: Orthopedics;  Laterality:              Right;  BMI    Body Mass Index: 24.58 kg/m      Reproductive/Obstetrics negative OB ROS                            Anesthesia Physical Anesthesia Plan  ASA: 3  Anesthesia Plan: General   Post-op Pain Management:    Induction: Intravenous  PONV Risk Score and Plan: Propofol infusion and TIVA  Airway Management Planned: Natural Airway and Nasal Cannula  Additional Equipment:  Intra-op Plan:   Post-operative Plan:   Informed Consent: I have reviewed the patients History and Physical, chart, labs and discussed the procedure including the risks, benefits and alternatives for the proposed anesthesia with the patient or authorized representative who has indicated his/her understanding and acceptance.     Dental Advisory Given  Plan Discussed with: Anesthesiologist, CRNA and Surgeon  Anesthesia Plan Comments: (Patient consented for risks of anesthesia including but not limited to:  - adverse reactions to medications - risk of airway placement if required - damage to eyes, teeth, lips or other oral mucosa - nerve damage due to positioning  - sore throat or hoarseness - Damage to heart, brain, nerves, lungs, other parts of body or loss of life  Patient voiced understanding.)        Anesthesia Quick Evaluation

## 2022-01-27 ENCOUNTER — Other Ambulatory Visit: Payer: Self-pay

## 2022-01-27 ENCOUNTER — Other Ambulatory Visit: Payer: Self-pay | Admitting: Family

## 2022-01-27 ENCOUNTER — Encounter: Payer: Self-pay | Admitting: Gastroenterology

## 2022-01-27 LAB — SURGICAL PATHOLOGY

## 2022-01-27 NOTE — Telephone Encounter (Signed)
Spoke to patient about note and glucose in urine but she stated that she is unable to come in to office for appointment because she cant take off of work she has appointment scheduled for this month and she stated that she will address it then.

## 2022-01-28 ENCOUNTER — Encounter: Payer: Self-pay | Admitting: Gastroenterology

## 2022-01-29 ENCOUNTER — Other Ambulatory Visit: Payer: Self-pay

## 2022-01-29 ENCOUNTER — Other Ambulatory Visit: Payer: Self-pay | Admitting: Family

## 2022-01-29 ENCOUNTER — Encounter: Payer: Self-pay | Admitting: Gastroenterology

## 2022-01-29 DIAGNOSIS — E119 Type 2 diabetes mellitus without complications: Secondary | ICD-10-CM

## 2022-01-29 DIAGNOSIS — Z1231 Encounter for screening mammogram for malignant neoplasm of breast: Secondary | ICD-10-CM

## 2022-01-29 MED ORDER — FREESTYLE LIBRE 3 SENSOR MISC
3 refills | Status: DC
  Filled 2022-01-29: qty 2, 28d supply, fill #0
  Filled 2022-03-17: qty 2, 28d supply, fill #1

## 2022-01-30 ENCOUNTER — Other Ambulatory Visit: Payer: Self-pay

## 2022-02-02 ENCOUNTER — Other Ambulatory Visit: Payer: Self-pay

## 2022-02-10 ENCOUNTER — Other Ambulatory Visit: Payer: Self-pay

## 2022-02-10 ENCOUNTER — Telehealth: Payer: Self-pay

## 2022-02-10 ENCOUNTER — Other Ambulatory Visit: Payer: Self-pay | Admitting: Family

## 2022-02-10 DIAGNOSIS — E119 Type 2 diabetes mellitus without complications: Secondary | ICD-10-CM

## 2022-02-10 MED ORDER — EMPAGLIFLOZIN 10 MG PO TABS
ORAL_TABLET | ORAL | 3 refills | Status: DC
  Filled 2022-02-10 (×2): qty 90, 90d supply, fill #0

## 2022-02-10 MED FILL — Empagliflozin Tab 10 MG: ORAL | 30 days supply | Qty: 30 | Fill #0 | Status: CN

## 2022-02-10 NOTE — Telephone Encounter (Signed)
Refills for Jardiance 10 mg sent in to pharmacy and called patient to inform her

## 2022-02-10 NOTE — Telephone Encounter (Signed)
Jenate Please refill jardiance 10 mg  qd for 90 day supply with refills Please call and let pt know once you have sent in

## 2022-02-16 ENCOUNTER — Other Ambulatory Visit: Payer: Self-pay

## 2022-02-24 ENCOUNTER — Ambulatory Visit
Admission: RE | Admit: 2022-02-24 | Discharge: 2022-02-24 | Disposition: A | Discharge status: Home / Self Care | Payer: 59 | Source: Ambulatory Visit | Attending: Family | Admitting: Family

## 2022-02-24 DIAGNOSIS — Z1231 Encounter for screening mammogram for malignant neoplasm of breast: Secondary | ICD-10-CM | POA: Diagnosis not present

## 2022-03-09 ENCOUNTER — Other Ambulatory Visit: Payer: Self-pay | Admitting: Family

## 2022-03-09 ENCOUNTER — Encounter: Payer: Self-pay | Admitting: Family

## 2022-03-09 ENCOUNTER — Other Ambulatory Visit
Admission: RE | Admit: 2022-03-09 | Discharge: 2022-03-09 | Disposition: A | Discharge status: Home / Self Care | Payer: 59 | Attending: Family | Admitting: Family

## 2022-03-09 ENCOUNTER — Other Ambulatory Visit: Payer: Self-pay

## 2022-03-09 DIAGNOSIS — E782 Mixed hyperlipidemia: Secondary | ICD-10-CM

## 2022-03-09 DIAGNOSIS — E119 Type 2 diabetes mellitus without complications: Secondary | ICD-10-CM | POA: Insufficient documentation

## 2022-03-09 LAB — HEMOGLOBIN A1C
Hgb A1c MFr Bld: 6.5 % — ABNORMAL HIGH (ref 4.8–5.6)
Mean Plasma Glucose: 139.85 mg/dL

## 2022-03-09 NOTE — Addendum Note (Signed)
Addended by: Leeanne Rio on: 03/09/2022 04:08 PM   Modules accepted: Orders

## 2022-03-10 ENCOUNTER — Encounter: Payer: Self-pay | Admitting: Family

## 2022-03-13 ENCOUNTER — Other Ambulatory Visit: Payer: Self-pay

## 2022-03-16 ENCOUNTER — Ambulatory Visit: Payer: 59 | Admitting: Family

## 2022-03-16 ENCOUNTER — Other Ambulatory Visit: Payer: Self-pay

## 2022-03-16 DIAGNOSIS — E1129 Type 2 diabetes mellitus with other diabetic kidney complication: Secondary | ICD-10-CM | POA: Diagnosis not present

## 2022-03-16 DIAGNOSIS — E785 Hyperlipidemia, unspecified: Secondary | ICD-10-CM | POA: Diagnosis not present

## 2022-03-16 DIAGNOSIS — I10 Essential (primary) hypertension: Secondary | ICD-10-CM | POA: Diagnosis not present

## 2022-03-16 DIAGNOSIS — R809 Proteinuria, unspecified: Secondary | ICD-10-CM | POA: Diagnosis not present

## 2022-03-16 MED ORDER — JARDIANCE 25 MG PO TABS
25.0000 mg | ORAL_TABLET | Freq: Every day | ORAL | 3 refills | Status: DC
  Filled 2022-03-16 (×2): qty 90, 90d supply, fill #0
  Filled 2022-06-09: qty 90, 90d supply, fill #1

## 2022-03-17 ENCOUNTER — Other Ambulatory Visit: Payer: Self-pay

## 2022-03-17 ENCOUNTER — Ambulatory Visit: Payer: 59 | Admitting: Family

## 2022-03-19 ENCOUNTER — Other Ambulatory Visit: Payer: Self-pay

## 2022-03-31 DIAGNOSIS — E1129 Type 2 diabetes mellitus with other diabetic kidney complication: Secondary | ICD-10-CM | POA: Diagnosis not present

## 2022-03-31 DIAGNOSIS — R809 Proteinuria, unspecified: Secondary | ICD-10-CM | POA: Diagnosis not present

## 2022-03-31 DIAGNOSIS — E7800 Pure hypercholesterolemia, unspecified: Secondary | ICD-10-CM | POA: Diagnosis not present

## 2022-04-06 ENCOUNTER — Other Ambulatory Visit: Payer: Self-pay

## 2022-04-23 ENCOUNTER — Other Ambulatory Visit: Payer: Self-pay | Admitting: Family

## 2022-04-23 ENCOUNTER — Other Ambulatory Visit: Payer: Self-pay

## 2022-04-23 DIAGNOSIS — E785 Hyperlipidemia, unspecified: Secondary | ICD-10-CM

## 2022-04-23 MED ORDER — ROSUVASTATIN CALCIUM 20 MG PO TABS
20.0000 mg | ORAL_TABLET | Freq: Every day | ORAL | 1 refills | Status: DC
  Filled 2022-04-23: qty 90, fill #0
  Filled 2022-08-12: qty 90, 90d supply, fill #0
  Filled 2022-10-26: qty 90, 90d supply, fill #1

## 2022-04-24 ENCOUNTER — Other Ambulatory Visit: Payer: Self-pay

## 2022-05-06 ENCOUNTER — Other Ambulatory Visit: Payer: Self-pay

## 2022-05-10 ENCOUNTER — Other Ambulatory Visit: Payer: Self-pay

## 2022-05-11 ENCOUNTER — Other Ambulatory Visit: Payer: Self-pay

## 2022-06-09 ENCOUNTER — Other Ambulatory Visit: Payer: Self-pay

## 2022-06-26 ENCOUNTER — Ambulatory Visit: Payer: 59 | Admitting: Podiatry

## 2022-07-01 ENCOUNTER — Telehealth: Payer: 59 | Admitting: Physician Assistant

## 2022-07-01 DIAGNOSIS — R3989 Other symptoms and signs involving the genitourinary system: Secondary | ICD-10-CM | POA: Diagnosis not present

## 2022-07-01 MED ORDER — CEPHALEXIN 500 MG PO CAPS: 500.0000 mg | ORAL_CAPSULE | Freq: Two times a day (BID) | ORAL | 0 refills | Status: AC

## 2022-07-01 NOTE — Progress Notes (Signed)

## 2022-07-01 NOTE — Progress Notes (Signed)
I have spent 5 minutes in review of e-visit questionnaire, review and updating patient chart, medical decision making and response to patient.   Lamon Rotundo Cody Treena Cosman, PA-C    

## 2022-07-02 MED ORDER — FLUCONAZOLE 150 MG PO TABS: ORAL_TABLET | ORAL | 0 refills | Status: DC

## 2022-07-02 NOTE — Addendum Note (Signed)
Addended by: Brunetta Jeans on: 07/02/2022 08:11 AM   Modules accepted: Orders

## 2022-07-03 ENCOUNTER — Ambulatory Visit: Payer: 59 | Admitting: Podiatry

## 2022-07-22 ENCOUNTER — Telehealth: Payer: Self-pay | Admitting: Family

## 2022-07-22 DIAGNOSIS — I10 Essential (primary) hypertension: Secondary | ICD-10-CM

## 2022-07-22 DIAGNOSIS — E782 Mixed hyperlipidemia: Secondary | ICD-10-CM

## 2022-07-22 NOTE — Telephone Encounter (Signed)
Patient has an appointment with [facility] on 08/21/22 and would like to do labs this month. Please order labs, she will be going to [facility] outpatient lab.

## 2022-07-23 NOTE — Addendum Note (Signed)
Addended by: [place], Arlo Butt on: 07/23/2022 12:31 PM   Modules accepted: Orders

## 2022-07-31 ENCOUNTER — Ambulatory Visit: Payer: 59 | Admitting: Podiatry

## 2022-08-12 ENCOUNTER — Other Ambulatory Visit: Payer: Self-pay | Admitting: Family

## 2022-08-12 ENCOUNTER — Other Ambulatory Visit: Payer: Self-pay

## 2022-08-13 ENCOUNTER — Other Ambulatory Visit
Admission: RE | Admit: 2022-08-13 | Discharge: 2022-08-13 | Disposition: A | Discharge status: Home / Self Care | Payer: 59 | Attending: Family | Admitting: Family

## 2022-08-13 ENCOUNTER — Other Ambulatory Visit: Payer: Self-pay

## 2022-08-13 DIAGNOSIS — I10 Essential (primary) hypertension: Secondary | ICD-10-CM | POA: Insufficient documentation

## 2022-08-13 DIAGNOSIS — E782 Mixed hyperlipidemia: Secondary | ICD-10-CM | POA: Diagnosis not present

## 2022-08-13 LAB — COMPREHENSIVE METABOLIC PANEL
ALT: 19 U/L (ref 0–44)
AST: 22 U/L (ref 15–41)
Albumin: 4.3 g/dL (ref 3.5–5.0)
Alkaline Phosphatase: 61 U/L (ref 38–126)
Anion gap: 8 (ref 5–15)
BUN: 17 mg/dL (ref 6–20)
CO2: 24 mmol/L (ref 22–32)
Calcium: 9 mg/dL (ref 8.9–10.3)
Chloride: 107 mmol/L (ref 98–111)
Creatinine, Ser: 0.7 mg/dL (ref 0.44–1.00)
GFR, Estimated: >60 mL/min (ref >60)
Glucose, Bld: 126 mg/dL — ABNORMAL HIGH (ref 70–99)
Potassium: 3.7 mmol/L (ref 3.5–5.1)
Sodium: 139 mmol/L (ref 135–145)
Total Bilirubin: 0.5 mg/dL (ref 0.3–1.2)
Total Protein: 7.9 g/dL (ref 6.5–8.1)

## 2022-08-13 LAB — LIPID PANEL
Cholesterol: 193 mg/dL (ref 0–200)
HDL: 77 mg/dL (ref >40)
LDL Cholesterol: 98 mg/dL (ref 0–99)
Total CHOL/HDL Ratio: 2.5 RATIO
Triglycerides: 90 mg/dL (ref <150)
VLDL: 18 mg/dL (ref 0–40)

## 2022-08-13 MED ORDER — FREESTYLE LITE TEST VI STRP
ORAL_STRIP | 4 refills | Status: DC
  Filled 2022-08-13: qty 100, 90d supply, fill #0
  Filled 2023-02-19 (×2): qty 100, 90d supply, fill #1

## 2022-08-13 NOTE — Addendum Note (Signed)
Addended by: Milbert Coulter on: 08/13/2022 07:55 AM   Modules accepted: Orders

## 2022-08-14 LAB — HEMOGLOBIN A1C
Hgb A1c MFr Bld: 6.5 % — ABNORMAL HIGH (ref 4.8–5.6)
Mean Plasma Glucose: 140 mg/dL

## 2022-08-19 ENCOUNTER — Encounter: Payer: Self-pay | Admitting: Family

## 2022-08-21 ENCOUNTER — Encounter: Payer: Self-pay | Admitting: Family

## 2022-08-21 ENCOUNTER — Ambulatory Visit: Payer: Commercial Managed Care - PPO | Admitting: Family

## 2022-09-07 ENCOUNTER — Ambulatory Visit
Admission: RE | Admit: 2022-09-07 | Discharge: 2022-09-07 | Disposition: A | Discharge status: Home / Self Care | Payer: Commercial Managed Care - PPO | Attending: Family | Admitting: Family

## 2022-09-07 ENCOUNTER — Encounter: Payer: Self-pay | Admitting: Family

## 2022-09-07 ENCOUNTER — Ambulatory Visit
Admission: RE | Admit: 2022-09-07 | Discharge: 2022-09-07 | Disposition: A | Discharge status: Home / Self Care | Payer: Commercial Managed Care - PPO | Source: Ambulatory Visit | Attending: Family | Admitting: Family

## 2022-09-07 ENCOUNTER — Other Ambulatory Visit: Payer: Self-pay | Admitting: Family

## 2022-09-07 DIAGNOSIS — R071 Chest pain on breathing: Secondary | ICD-10-CM | POA: Diagnosis not present

## 2022-09-07 DIAGNOSIS — R0781 Pleurodynia: Secondary | ICD-10-CM | POA: Diagnosis not present

## 2022-09-11 ENCOUNTER — Encounter: Payer: Self-pay | Admitting: Family

## 2022-09-11 ENCOUNTER — Other Ambulatory Visit: Payer: Self-pay

## 2022-09-11 ENCOUNTER — Ambulatory Visit: Payer: Commercial Managed Care - PPO | Admitting: Family

## 2022-09-11 VITALS — BP 132/80 | HR 78 | Temp 97.6°F | Ht 67.0 in | Wt 170.4 lb

## 2022-09-11 DIAGNOSIS — I10 Essential (primary) hypertension: Secondary | ICD-10-CM

## 2022-09-11 DIAGNOSIS — E119 Type 2 diabetes mellitus without complications: Secondary | ICD-10-CM

## 2022-09-11 LAB — MICROALBUMIN / CREATININE URINE RATIO
Creatinine,U: 42.2 mg/dL
Microalb Creat Ratio: 1.7 mg/g (ref 0.0–30.0)
Microalb, Ur: <0.7 mg/dL (ref 0.0–1.9)

## 2022-09-11 MED ORDER — RYBELSUS 3 MG PO TABS
3.0000 mg | ORAL_TABLET | Freq: Every day | ORAL | 2 refills | Status: DC
  Filled 2022-09-11: qty 30, 30d supply, fill #0

## 2022-09-11 NOTE — Progress Notes (Signed)
Assessment & Plan:  Controlled type 2 diabetes mellitus without complication, without long-term current use of insulin (HCC) Assessment & Plan: Lab Results  Component Value Date   HGBA1C 6.5 (H) 08/13/2022   Excellent control.  Patient interested in Rybelsus for additional benefit of weight loss.  Counseled on blackbox warning as it relates to major thyroid cancer, MEN.  Counseled on side effects and administration of medication.  Follow-up in 3 months  Orders: -     Microalbumin / creatinine urine ratio -     Rybelsus; Take 1 tablet (3 mg total) by mouth daily.  Dispense: 30 tablet; Refill: 2 -     Lipid panel; Future -     Hemoglobin A1c; Future  Primary hypertension Assessment & Plan: Chronic, stable. Continue losartan '25mg'$  qd      Return precautions given.   Risks, benefits, and alternatives of the medications and treatment plan prescribed today were discussed, and patient expressed understanding.   Education regarding symptom management and diagnosis given to patient on AVS either electronically or printed.  Return in about 3 months (around 12/11/2022).  Mable Paris, FNP  Subjective:    Patient ID: Alyssa Schultz, female    DOB: [birth year], 57 y.o.   MRN: [id number]  CC: Alyssa Schultz is a 57 y.o. female who presents today for follow up.   HPI: DM- she is compliant jardiance '25mg'$ .  She complains of increased thirst on medication.  She is interested in this.  No family or personal history of thyroid cancer, MEN.     FBG 109, 120.    Rib pain is completely resolved.  No pain with deep inspiration.  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   Allergies: Haemophilus b polysaccharide vaccine, Influenza vaccine live, Influenza vaccines, Hydrocodone, Morphine and related, and Prednisone Current Outpatient Medications on File Prior to Visit  Medication Sig Dispense Refill   aspirin EC  81 MG EC tablet Take 1 tablet (81 mg total) by mouth daily. 30 tablet 0   Blood Glucose Monitoring Suppl (FREESTYLE FREEDOM LITE) w/Device KIT      Blood Glucose Monitoring Suppl (FREESTYLE FREEDOM LITE) w/Device KIT Use as directed 1 kit 0   Continuous Blood Gluc Sensor (FREESTYLE LIBRE 3 SENSOR) MISC Place 1 sensor on the skin every 14 days. Use to check glucose continuously 2 each 3   empagliflozin (JARDIANCE) 10 MG TABS tablet Take 1 tablet (10 mg total) by mouth daily before breakfast. 90 tablet 3   empagliflozin (JARDIANCE) 25 MG TABS tablet Take 1 tablet (25 mg total) by mouth once daily 90 tablet 3   fluconazole (DIFLUCAN) 150 MG tablet Take 1 tablet PO once. Repeat in 3 days if needed. 2 tablet 0   glucose blood (FREESTYLE LITE) test strip Used to check blood sugars once a day. 100 each 4   losartan (COZAAR) 25 MG tablet Take 1 tablet (25 mg total) by mouth once a day 30 tablet 11   meclizine (ANTIVERT) 25 MG tablet Take 2 tablets (50 mg total) by mouth 3 (three) times daily as needed for dizziness 60 tablet 1   meloxicam (MOBIC) 7.5 MG tablet Take One tab PO BID with food 28 tablet 0   ondansetron (ZOFRAN-ODT) 4 MG disintegrating tablet Take 1 tablet (4 mg total) by mouth every 8 (eight) hours as needed for nausea or vomiting. 30 tablet 0   phenazopyridine (PYRIDIUM) 100 MG tablet  Take 1 tablet (100 mg total) by mouth 3 (three) times daily as needed for pain. Use for 2 days as symptoms should then be resolved 12 tablet 2   rosuvastatin (CRESTOR) 20 MG tablet Take 1 tablet (20 mg total) by mouth daily. 90 tablet 1   TRUEPLUS LANCETS 30G MISC USE AS DIRECTED 100 each 5   valACYclovir (VALTREX) 500 MG tablet Take 2 tablets (1,000 mg total) by mouth 2 (two) times daily. Take prn cold sore on on day 12 tablet 1   No current facility-administered medications on file prior to visit.    Review of Systems  Constitutional:  Negative for chills and fever.  Respiratory:  Negative for cough.    Cardiovascular:  Negative for chest pain and palpitations.  Gastrointestinal:  Negative for nausea and vomiting.      Objective:    BP 132/80   Pulse 78   Temp 97.6 F (36.4 C) (Oral)   Ht '5\' 7"'$  (1.702 m)   Wt 170 lb 6.4 oz (77.3 kg)   LMP 07/24/2007 Comment: ablation in 2008-pt reports she went through menopause after this  SpO2 98%   BMI 26.69 kg/m  BP Readings from Last 3 Encounters:  09/11/22 132/80  01/26/22 118/76  12/08/21 116/64   Wt Readings from Last 3 Encounters:  09/11/22 170 lb 6.4 oz (77.3 kg)  01/26/22 150 lb (68 kg)  10/20/21 164 lb 1.6 oz (74.4 kg)    Physical Exam Vitals reviewed.  Constitutional:      Appearance: She is well-developed.  Eyes:     Conjunctiva/sclera: Conjunctivae normal.  Cardiovascular:     Rate and Rhythm: Normal rate and regular rhythm.     Pulses: Normal pulses.     Heart sounds: Normal heart sounds.  Pulmonary:     Effort: Pulmonary effort is normal.     Breath sounds: Normal breath sounds. No wheezing, rhonchi or rales.  Skin:    General: Skin is warm and dry.  Neurological:     Mental Status: She is alert.  Psychiatric:        Speech: Speech normal.        Behavior: Behavior normal.        Thought Content: Thought content normal.

## 2022-09-11 NOTE — Assessment & Plan Note (Signed)
Lab Results  Component Value Date   HGBA1C 6.5 (H) 08/13/2022   Excellent control.  Patient interested in Rybelsus for additional benefit of weight loss.  Counseled on blackbox warning as it relates to major thyroid cancer, MEN.  Counseled on side effects and administration of medication.  Follow-up in 3 months

## 2022-09-11 NOTE — Assessment & Plan Note (Signed)
Chronic, stable. Continue losartan '25mg'$  qd

## 2022-09-11 NOTE — Patient Instructions (Signed)
Start rybelsus '3mg'$  for 30 days and then we will increase to '7mg'$  once daily. You may call the office for this new prescription.  Also, with the Rybelsus, be very sure that you take on an empty stomach with a little bit of water and take 30 minutes before eating. Do not take with other medicines and no other food with medication. This is important for effectiveness.   Remember black box warning that you may not take this medication if you or a family member is diagnosed with thyroid cancer.    Semaglutide Oral Tablets What is this medicine? SEMAGLUTIDE (Sem a GLOO tide) controls blood sugar in people with type 2 diabetes. It is used with lifestyle changes like diet and exercise. This medicine may be used for other purposes; ask your health care provider or pharmacist if you have questions. COMMON BRAND NAME(S): Rybelsus What should I tell my health care provider before I take this medicine? They need to know if you have any of these conditions: endocrine tumors (MEN 2) or if someone in your family had these tumors eye disease history of pancreatitis kidney disease stomach or intestine problems thyroid cancer or if someone in your family had thyroid cancer vision problems an unusual or allergic reaction to semaglutide, other medicines, foods, dyes, or preservatives pregnant or trying to get pregnant breast-feeding How should I use this medicine? Take this medicine by mouth. Take it as directed on the prescription label at the same time every day. Take the dose right after waking up. Do not eat or drink anything before taking it. Do not take it with any other drink except a glass of plain water that is less than 4 ounces (less than 120 mL). Do not cut, crush or chew this medicine. Swallow the tablets whole. After taking it, do not eat breakfast, drink, or take any other medicines or vitamins for at least 30 minutes. Keep taking it unless your health care provider tells you to stop. A special  MedGuide will be given to you by the pharmacist with each prescription and refill. Be sure to read this information carefully each time. Talk to your health care provider about the use of this medicine in children. Special care may be needed. Overdosage: If you think you have taken too much of this medicine contact a poison control center or emergency room at once. NOTE: This medicine is only for you. Do not share this medicine with others. What if I miss a dose? If you miss a dose, skip it. Take your next dose at the normal time. Do not take extra or 2 doses at the same time to make up for the missed dose. What may interact with this medicine? What may interact with this medicine? aminophylline carbamazepine cyclosporine digoxin levothyroxine other medicines for diabetes phenytoin tacrolimus theophylline warfarin Many medications may cause changes in blood sugar, these include: alcohol containing beverages antiviral medicines for HIV or AIDS aspirin and aspirin-like drugs certain medicines for blood pressure, heart disease, irregular heart beat chromium diuretics female hormones, such as estrogens or progestins, birth control pills fenofibrate gemfibrozil isoniazid lanreotide female hormones or anabolic steroids MAOIs like Carbex, Eldepryl, Marplan, Nardil, and Parnate medicines for weight loss medicines for allergies, asthma, cold, or cough medicines for depression, anxiety, or psychotic disturbances niacin nicotine NSAIDs, medicines for pain and inflammation, like ibuprofen or naproxen octreotide pasireotide pentamidine phenytoin probenecid quinolone antibiotics such as ciprofloxacin, levofloxacin, ofloxacin some herbal dietary supplements steroid medicines such as prednisone or cortisone sulfamethoxazole;  trimethoprim thyroid hormones Some medications can hide the warning symptoms of low blood sugar (hypoglycemia). You may need to monitor your blood sugar more  closely if you are taking one of these medications. These include: beta-blockers, often used for high blood pressure or heart problems (examples include atenolol, metoprolol, propranolol) clonidine guanethidine reserpine This list may not describe all possible interactions. Give your health care provider a list of all the medicines, herbs, non-prescription drugs, or dietary supplements you use. Also tell them if you smoke, drink alcohol, or use illegal drugs. Some items may interact with your medicine. What should I watch for while using this medicine? Visit your health care provider for regular checks on your progress. Check with your health care provider if you have severe diarrhea, nausea, and vomiting, or if you sweat a lot. The loss of too much body fluid may make it dangerous for you to take this medicine. A test called the HbA1C (A1C) will be monitored. This is a simple blood test. It measures your blood sugar control over the last 2 to 3 months. You will receive this test every 3 to 6 months. Learn how to check your blood sugar. Learn the symptoms of low and high blood sugar and how to manage them. Always carry a quick-source of sugar with you in case you have symptoms of low blood sugar. Examples include hard sugar candy or glucose tablets. Make sure others know that you can choke if you eat or drink when you develop serious symptoms of low blood sugar, such as seizures or unconsciousness. Get medical help at once. Tell your health care provider if you have high blood sugar. You might need to change the dose of your medicine. If you are sick or exercising more than usual, you might need to change the dose of your medicine. Do not skip meals. Ask your health care provider if you should avoid alcohol. Many nonprescription cough and cold products contain sugar or alcohol. These can affect blood sugar. Wear a medical ID bracelet or chain. Carry a card that describes your condition. List the  medicines and doses you take on the card. Do not become pregnant while taking this medicine. Women should inform their health care provider if they wish to become pregnant or think they might be pregnant. There is a potential for serious side effects to an unborn child. Talk to your health care provider for more information. Do not breast-feed an infant while taking this medicine. What side effects may I notice from receiving this medicine? Side effects that you should report to your doctor or health care provider as soon as possible: allergic reactions (skin rash, itching or hives; swelling of the face, lips, or tongue) changes in vision diarrhea that continues or is severe infection (fever, chills, cough, sore throat, pain or trouble passing urine) kidney injury (trouble passing urine or change in the amount of urine) low blood sugar (feeling anxious; confusion; dizziness; increased hunger; unusually weak or tired; increased sweating; shakiness; cold, clammy skin; irritable; headache; blurred vision; fast heartbeat; loss of consciousness) lump or swelling on the neck painful or difficulty swallowing severe nausea severe or unusual stomach pain trouble breathing vomiting Side effects that usually do not require medical attention (report these to your doctor or health care provider if they continue or are bothersome): constipation diarrhea nausea upset stomach This list may not describe all possible side effects. Call your doctor for medical advice about side effects. You may report side effects to FDA at  1-800-FDA-1088. Where should I keep my medicine? Keep out of the reach of children and pets. Store at room temperature between 20 and 25 degrees C (68 and 77 degrees F). Keep this medicine in the original container. Protect from moisture. Keep the container tightly closed. Get rid of any unused medicine after the expiration date. To get rid of medicines that are no longer needed or have  expired: Take the medicine to a medicine take-back program. Check with your pharmacy or law enforcement to find a location. If you cannot return the medicine, check the label or package insert to see if the medicine should be thrown out in the garbage or flushed down the toilet. If you are not sure, ask your health care provider. If it is safe to put it in the trash, take the medicine out of the container. Mix the medicine with cat litter, dirt, coffee grounds, or other unwanted substance. Seal the mixture in a bag or container. Put it in the trash. NOTE: This sheet is a summary. It may not cover all possible information. If you have questions about this medicine, talk to your doctor, pharmacist, or health care provider.  2021 Elsevier/Gold Standard (2020-07-01 15:08:33)

## 2022-09-14 ENCOUNTER — Other Ambulatory Visit: Payer: Self-pay

## 2022-09-17 ENCOUNTER — Encounter: Payer: Self-pay | Admitting: Family

## 2022-09-21 ENCOUNTER — Other Ambulatory Visit: Payer: Self-pay | Admitting: Family

## 2022-09-21 DIAGNOSIS — R8290 Unspecified abnormal findings in urine: Secondary | ICD-10-CM

## 2022-09-23 ENCOUNTER — Other Ambulatory Visit: Payer: Self-pay | Admitting: Family

## 2022-09-23 ENCOUNTER — Other Ambulatory Visit (HOSPITAL_COMMUNITY)
Admission: RE | Admit: 2022-09-23 | Discharge: 2022-09-23 | Disposition: A | Discharge status: Home / Self Care | Payer: Commercial Managed Care - PPO | Source: Ambulatory Visit | Attending: Family | Admitting: Family

## 2022-09-23 ENCOUNTER — Other Ambulatory Visit
Admission: RE | Admit: 2022-09-23 | Discharge: 2022-09-23 | Disposition: A | Discharge status: Home / Self Care | Payer: Commercial Managed Care - PPO | Attending: Family | Admitting: Family

## 2022-09-23 DIAGNOSIS — R8290 Unspecified abnormal findings in urine: Secondary | ICD-10-CM | POA: Insufficient documentation

## 2022-09-23 LAB — URINALYSIS, ROUTINE W REFLEX MICROSCOPIC
Bilirubin Urine: NEGATIVE
Glucose, UA: 500 mg/dL — AB
Ketones, ur: NEGATIVE mg/dL
Leukocytes,Ua: NEGATIVE
Nitrite: NEGATIVE
Protein, ur: NEGATIVE mg/dL
Specific Gravity, Urine: 1.01 (ref 1.005–1.030)
pH: 5.5 (ref 5.0–8.0)

## 2022-09-23 LAB — URINALYSIS, MICROSCOPIC (REFLEX)
Bacteria, UA: NONE SEEN
WBC, UA: NONE SEEN WBC/hpf (ref 0–5)

## 2022-09-24 LAB — URINE CULTURE: Culture: <10000 — AB

## 2022-09-25 ENCOUNTER — Other Ambulatory Visit: Payer: Self-pay | Admitting: Family

## 2022-09-25 ENCOUNTER — Encounter: Payer: Self-pay | Admitting: Family

## 2022-09-25 LAB — MOLECULAR ANCILLARY ONLY
Bacterial Vaginitis (gardnerella): NEGATIVE
Candida Glabrata: NEGATIVE
Candida Vaginitis: NEGATIVE
Chlamydia: NEGATIVE
Comment: NEGATIVE
Comment: NEGATIVE
Comment: NEGATIVE
Comment: NEGATIVE
Comment: NEGATIVE
Comment: NORMAL
Neisseria Gonorrhea: NEGATIVE
Trichomonas: NEGATIVE

## 2022-10-08 ENCOUNTER — Other Ambulatory Visit: Payer: Self-pay

## 2022-10-08 DIAGNOSIS — E1129 Type 2 diabetes mellitus with other diabetic kidney complication: Secondary | ICD-10-CM | POA: Diagnosis not present

## 2022-10-08 DIAGNOSIS — E7800 Pure hypercholesterolemia, unspecified: Secondary | ICD-10-CM | POA: Diagnosis not present

## 2022-10-08 DIAGNOSIS — R809 Proteinuria, unspecified: Secondary | ICD-10-CM | POA: Diagnosis not present

## 2022-10-08 MED ORDER — JARDIANCE 10 MG PO TABS
10.0000 mg | ORAL_TABLET | ORAL | 11 refills | Status: DC
  Filled 2022-10-08: qty 30, 30d supply, fill #0

## 2022-10-09 ENCOUNTER — Other Ambulatory Visit: Payer: Self-pay

## 2022-10-11 ENCOUNTER — Encounter: Payer: Self-pay | Admitting: Family

## 2022-10-12 ENCOUNTER — Telehealth: Payer: Commercial Managed Care - PPO | Admitting: Family

## 2022-10-12 DIAGNOSIS — U071 COVID-19: Secondary | ICD-10-CM | POA: Diagnosis not present

## 2022-10-12 MED ORDER — MECLIZINE HCL 25 MG PO TABS: 25.0000 mg | ORAL_TABLET | Freq: Three times a day (TID) | ORAL | 1 refills | Status: AC | PRN

## 2022-10-12 NOTE — Progress Notes (Signed)
Virtual Visit via Video Note  I connected with Alyssa Schultz on 10/12/22 at 10:15 AM EST by a video enabled telemedicine application and verified that I am speaking with the correct person using two identifiers. Location patient: home Location provider: work  Persons participating in the virtual visit: patient, provider  I discussed the limitations of evaluation and management by telemedicine and the availability of in person appointments. The patient expressed understanding and agreed to proceed.   HPI: Complains of vertigo 'when I would turn by head' x 3 days ago.  Tested positive for covid yesterday. She is feeling much better today. She has 'tad of cough' and sinus congestion.   No fever, sob, wheezing, sinus pressure, ear pain.   The previous time she had covid, she felt the same way with vertigo.   She started Amoxicillin '875mg'$  old prescription by half tablet as prescribed previously by ENT.  She hasn't taken meclizine.  She has been taking cloricidin.    GFR > 60    ROS: See pertinent positives and negatives per HPI.  EXAM:  VITALS per patient if applicable: LMP [id number] Comment: ablation in 2008-pt reports she went through menopause after this BP Readings from Last 3 Encounters:  09/11/22 132/80  01/26/22 118/76  12/08/21 116/64   Wt Readings from Last 3 Encounters:  09/11/22 170 lb 6.4 oz (77.3 kg)  01/26/22 150 lb (68 kg)  10/20/21 164 lb 1.6 oz (74.4 kg)    GENERAL: alert, oriented, appears well and in no acute distress  HEENT: atraumatic, conjunttiva clear, no obvious abnormalities on inspection of external nose and ears  NECK: normal movements of the head and neck  LUNGS: on inspection no signs of respiratory distress, breathing rate appears normal, no obvious gross SOB, gasping or wheezing  CV: no obvious cyanosis  MS: moves all visible extremities without noticeable abnormality  PSYCH/NEURO: pleasant and cooperative, no obvious depression or  anxiety, speech and thought processing grossly intact  ASSESSMENT AND PLAN: COVID-19 Assessment & Plan: No acute respiratory distress.  Primary symptom of vertigo.  Question if point  coincidental in that she has had vertigo symptoms leading up to positive COVID or due to congestion.  Advised patient to start meclizine.  She politely declines antiviral at this time as we did discuss Paxlovid at length.  She will let me know how she is doing.  Discussed quarantine guidelines per CDC.   Orders: -     Meclizine HCl; Take 2 tablets (50 mg total) by mouth 3 (three) times daily as needed for dizziness  Dispense: 60 tablet; Refill: 1     -we discussed possible serious and likely etiologies, options for evaluation and workup, limitations of telemedicine visit vs in person visit, treatment, treatment risks and precautions. Pt prefers to treat via telemedicine empirically rather then risking or undertaking an in person visit at this moment.    I discussed the assessment and treatment plan with the patient. The patient was provided an opportunity to ask questions and all were answered. The patient agreed with the plan and demonstrated an understanding of the instructions.   The patient was advised to call back or seek an in-person evaluation if the symptoms worsen or if the condition fails to improve as anticipated.  Advised if desired AVS can be mailed or viewed via [place] if [employer] user.   Mable Paris, FNP

## 2022-10-12 NOTE — Patient Instructions (Signed)
Start meclizine for vertigo and sinus congestion as this is an antihistamine.   Please remain in quarantine for 5 days since symptom onset.

## 2022-10-12 NOTE — Telephone Encounter (Signed)
Spoke to pt and she stated that her symptoms started on Friday with sneezing an little coughing and stuffiness, she tested on Sat for Covid and it was positive, scheduled pt for virtual appt today @ 10:15 am

## 2022-10-12 NOTE — Assessment & Plan Note (Addendum)
No acute respiratory distress.  Primary symptom of vertigo.  Question if point  coincidental in that she has had vertigo symptoms leading up to positive COVID or due to congestion.  Advised patient to start meclizine.  She politely declines antiviral at this time as we did discuss Paxlovid at length.  She will let me know how she is doing.  Discussed quarantine guidelines per CDC.

## 2022-10-20 ENCOUNTER — Other Ambulatory Visit: Payer: Self-pay

## 2022-10-20 ENCOUNTER — Encounter: Payer: Self-pay | Admitting: Family

## 2022-10-22 ENCOUNTER — Other Ambulatory Visit: Payer: Self-pay | Admitting: Family

## 2022-10-22 ENCOUNTER — Other Ambulatory Visit: Payer: Self-pay

## 2022-10-22 MED ORDER — JARDIANCE 25 MG PO TABS
ORAL_TABLET | ORAL | 0 refills | Status: DC
  Filled 2022-10-22: qty 90, 90d supply, fill #0

## 2022-10-23 ENCOUNTER — Other Ambulatory Visit: Payer: Self-pay | Admitting: Family

## 2022-10-23 ENCOUNTER — Other Ambulatory Visit: Payer: Self-pay

## 2022-10-23 DIAGNOSIS — R809 Proteinuria, unspecified: Secondary | ICD-10-CM

## 2022-10-23 MED ORDER — EMPAGLIFLOZIN 25 MG PO TABS
25.0000 mg | ORAL_TABLET | Freq: Every day | ORAL | 3 refills | Status: DC
  Filled 2022-10-23: qty 90, 90d supply, fill #0
  Filled 2022-10-23: qty 90, fill #0

## 2022-10-26 ENCOUNTER — Other Ambulatory Visit: Payer: Self-pay

## 2022-11-03 DIAGNOSIS — Z01419 Encounter for gynecological examination (general) (routine) without abnormal findings: Secondary | ICD-10-CM | POA: Diagnosis not present

## 2022-11-03 DIAGNOSIS — E119 Type 2 diabetes mellitus without complications: Secondary | ICD-10-CM | POA: Diagnosis not present

## 2022-11-03 DIAGNOSIS — I10 Essential (primary) hypertension: Secondary | ICD-10-CM | POA: Diagnosis not present

## 2022-11-03 DIAGNOSIS — E785 Hyperlipidemia, unspecified: Secondary | ICD-10-CM | POA: Diagnosis not present

## 2022-11-03 DIAGNOSIS — R87610 Atypical squamous cells of undetermined significance on cytologic smear of cervix (ASC-US): Secondary | ICD-10-CM | POA: Diagnosis not present

## 2022-11-20 ENCOUNTER — Encounter: Payer: Self-pay | Admitting: Family

## 2022-11-20 NOTE — Telephone Encounter (Signed)
Spoke to pt and scheduled ov for 4/12/ 24

## 2022-11-23 ENCOUNTER — Encounter: Payer: Self-pay | Admitting: Family

## 2022-11-27 ENCOUNTER — Ambulatory Visit: Payer: Commercial Managed Care - PPO | Admitting: Family

## 2022-12-10 DIAGNOSIS — R809 Proteinuria, unspecified: Secondary | ICD-10-CM | POA: Diagnosis not present

## 2022-12-23 ENCOUNTER — Encounter: Payer: Self-pay | Admitting: Family

## 2022-12-23 NOTE — Telephone Encounter (Signed)
Spoke to pt and she is going to just try something otc first and see if that helps and if not then she will call and schedule an appt.

## 2023-01-12 ENCOUNTER — Telehealth: Payer: Commercial Managed Care - PPO | Admitting: Physician Assistant

## 2023-01-12 ENCOUNTER — Other Ambulatory Visit: Payer: Self-pay

## 2023-01-12 DIAGNOSIS — R3989 Other symptoms and signs involving the genitourinary system: Secondary | ICD-10-CM | POA: Diagnosis not present

## 2023-01-12 MED ORDER — CEPHALEXIN 500 MG PO CAPS
500.0000 mg | ORAL_CAPSULE | Freq: Two times a day (BID) | ORAL | 0 refills | Status: AC
  Filled 2023-01-12: qty 14, 7d supply, fill #0

## 2023-01-12 MED ORDER — FLUCONAZOLE 150 MG PO TABS
ORAL_TABLET | ORAL | 0 refills | Status: DC
  Filled 2023-01-12: qty 2, 3d supply, fill #0

## 2023-01-12 MED ORDER — PHENAZOPYRIDINE HCL 100 MG PO TABS
100.0000 mg | ORAL_TABLET | Freq: Three times a day (TID) | ORAL | 0 refills | Status: DC | PRN
  Filled 2023-01-12: qty 10, 4d supply, fill #0

## 2023-01-12 NOTE — Addendum Note (Signed)
Addended by: Waldon Merl on: 01/12/2023 08:23 AM   Modules accepted: Orders

## 2023-01-12 NOTE — Progress Notes (Signed)
E-Visit for Urinary Problems  We are sorry that you are not feeling well.  Here is how we plan to help!  Based on what you shared with me it looks like you most likely have a simple urinary tract infection.  A UTI (Urinary Tract Infection) is a bacterial infection of the bladder.  Most cases of urinary tract infections are simple to treat but a key part of your care is to encourage you to drink plenty of fluids and watch your symptoms carefully.  I have prescribed Keflex 500 mg twice a day for 7 days.  Your symptoms should gradually improve. Call us if the burning in your urine worsens, you develop worsening fever, back pain or pelvic pain or if your symptoms do not resolve after completing the antibiotic.]  I have sent in a prescription for Diflucan in case of yeast from antibiotic use.   Urinary tract infections can be prevented by drinking plenty of water to keep your body hydrated.  Also be sure when you wipe, wipe from front to back and don't hold it in!  If possible, empty your bladder every 4 hours.  HOME CARE Drink plenty of fluids Compete the full course of the antibiotics even if the symptoms resolve Remember, when you need to go.go. Holding in your urine can increase the likelihood of getting a UTI! GET HELP RIGHT AWAY IF: You cannot urinate You get a high fever Worsening back pain occurs You see blood in your urine You feel sick to your stomach or throw up You feel like you are going to pass out  MAKE SURE YOU  Understand these instructions. Will watch your condition. Will get help right away if you are not doing well or get worse.   Thank you for choosing an e-visit.  Your e-visit answers were reviewed by a board certified advanced clinical practitioner to complete your personal care plan. Depending upon the condition, your plan could have included both over the counter or prescription medications.  Please review your pharmacy choice. Make sure the pharmacy is open  so you can pick up prescription now. If there is a problem, you may contact your provider through [employer] and have the prescription routed to another pharmacy.  Your safety is important to [place]. If you have drug allergies check your prescription carefully.   For the next 24 hours you can use MyChart to ask questions about today's visit, request a non-urgent call back, or ask for a work or school excuse. You will get an email in the next two days asking about your experience. I hope that your e-visit has been valuable and will speed your recovery.

## 2023-01-12 NOTE — Progress Notes (Signed)
I have spent 5 minutes in review of e-visit questionnaire, review and updating patient chart, medical decision making and response to patient.   Whittney Steenson Cody Kamie Korber, PA-C    

## 2023-01-13 ENCOUNTER — Encounter: Payer: Self-pay | Admitting: Family

## 2023-01-13 NOTE — Telephone Encounter (Signed)
Patient is scheduled for 6/07 at 1:40 with Kara Dies. Patient will finish antibiotics for UTI on 6/05. Patient would like a urine done to ensure that the UTI is gone after the antibiotics. Patient was urinating blood with this UTI.

## 2023-01-21 DIAGNOSIS — M1711 Unilateral primary osteoarthritis, right knee: Secondary | ICD-10-CM | POA: Insufficient documentation

## 2023-01-21 DIAGNOSIS — M13861 Other specified arthritis, right knee: Secondary | ICD-10-CM | POA: Diagnosis not present

## 2023-01-21 DIAGNOSIS — E119 Type 2 diabetes mellitus without complications: Secondary | ICD-10-CM | POA: Diagnosis not present

## 2023-01-21 DIAGNOSIS — M25561 Pain in right knee: Secondary | ICD-10-CM | POA: Diagnosis not present

## 2023-01-22 ENCOUNTER — Encounter: Payer: Self-pay | Admitting: Nurse Practitioner

## 2023-01-22 ENCOUNTER — Ambulatory Visit: Payer: Commercial Managed Care - PPO | Admitting: Nurse Practitioner

## 2023-01-22 VITALS — BP 130/76 | HR 75 | Temp 97.5°F | Ht 65.0 in | Wt 173.6 lb

## 2023-01-22 DIAGNOSIS — R399 Unspecified symptoms and signs involving the genitourinary system: Secondary | ICD-10-CM

## 2023-01-22 LAB — POCT URINALYSIS DIPSTICK
Bilirubin, UA: NEGATIVE
Blood, UA: NEGATIVE
Glucose, UA: POSITIVE — AB
Ketones, UA: NEGATIVE
Leukocytes, UA: NEGATIVE
Nitrite, UA: NEGATIVE
Protein, UA: NEGATIVE
Spec Grav, UA: 1.015 (ref 1.010–1.025)
Urobilinogen, UA: 0.2 E.U./dL
pH, UA: 6 (ref 5.0–8.0)

## 2023-01-22 LAB — URINALYSIS, ROUTINE W REFLEX MICROSCOPIC
Bilirubin Urine: NEGATIVE
Hgb urine dipstick: NEGATIVE
Ketones, ur: NEGATIVE
Leukocytes,Ua: NEGATIVE
Nitrite: NEGATIVE
RBC / HPF: NONE SEEN (ref 0–?)
Specific Gravity, Urine: 1.01 (ref 1.000–1.030)
Total Protein, Urine: NEGATIVE
Urine Glucose: >=1000 — AB
Urobilinogen, UA: 0.2 (ref 0.0–1.0)
WBC, UA: NONE SEEN (ref 0–?)
pH: 6 (ref 5.0–8.0)

## 2023-01-22 NOTE — Progress Notes (Signed)
Established Patient Office Visit  Subjective:  Patient ID: Alyssa Schultz, female    DOB: 05-03-1966  Age: 57 y.o. MRN: [id number]  CC:  Chief Complaint  Patient presents with   Urinary Tract Infection    HPI  Alyssa Schultz presents for follow up on UTI. She had UTI symptoms and was treated with cephalexin on 01/12/2023.  She would like to get  a urine test done to make sure that the infection is gone after completing the antibiotic.  She denies any urgency or frequency but have slight dysuria off and on but not as bad as before.  Denies flank pain or hematuria.  HPI   Past Medical History:  Diagnosis Date   Complication of anesthesia    hard to wake in 2017, patient reports she is 30 lb lighter now   Diabetes mellitus    dx 2014   just take pills   Hyperlipidemia    Hypertension    Neuromuscular disorder [facility])     Past Surgical History:  Procedure Laterality Date   abalsion     ablation     ANTERIOR CERVICAL DECOMP/DISCECTOMY FUSION N/A 09/19/2015   Procedure: ANTERIOR CERVICAL DECOMPRESSION/DISCECTOMY FUSION 2 LEVELS;  Surgeon: Estill Bamberg, MD;  Location: [facility] OR;  Service: Orthopedics;  Laterality: N/A;  Anterior cervical decompression fusion, cervical 3-4, cervical 4-5 decompression with instrumentation and allograft   BACK SURGERY     COLONOSCOPY WITH PROPOFOL N/A 07/18/2021   Procedure: COLONOSCOPY WITH PROPOFOL;  Surgeon: Pasty Spillers, MD;  Location: [facility] ENDOSCOPY;  Service: Endoscopy;  Laterality: N/A;   COLONOSCOPY WITH PROPOFOL N/A 01/26/2022   Procedure: COLONOSCOPY WITH PROPOFOL;  Surgeon: Toney Reil, MD;  Location: [facility] ENDOSCOPY;  Service: Gastroenterology;  Laterality: N/A;   FACIAL RECONSTRUCTION SURGERY     TRANSFORAMINAL LUMBAR INTERBODY FUSION (TLIF) WITH PEDICLE SCREW FIXATION 2 LEVEL Right 07/26/2019   Procedure: RIGHT-SIDED LUMBAR 5 - SACRUM 1 TRANSFORAMINAL LUMBAR INTERBODY FUSION WITH INSTRUMENTATION AND ALLOGRAFT;  Surgeon: Estill Bamberg, MD;  Location: [facility] OR;  Service: Orthopedics;  Laterality: Right;    Family History  Problem Relation Age of Onset   Diabetes Mother    Diabetes Maternal Grandmother    Hypertension Maternal Grandfather    Breast cancer Neg Hx    Thyroid cancer Neg Hx     Social History   Socioeconomic History   Marital status: Divorced    Spouse name: Not on file   Number of children: Not on file   Years of education: Not on file   Highest education level: Some college, no degree  Occupational History   Not on file  Tobacco Use   Smoking status: Never   Smokeless tobacco: Never  Vaping Use   Vaping Use: Never used  Substance and Sexual Activity   Alcohol use: Yes    Comment: Occasional wine   Drug use: No   Sexual activity: Yes  Other Topics Concern   Not on file  Social History Narrative   Works for [employer], Dr Orlie Dakin.   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[employer]; doing PT for cancer center            Social Determinants of Health   Financial Resource Strain: Medium Risk (01/20/2023)   Overall Financial Resource Strain (CARDIA)    Difficulty of Paying Living Expenses: Somewhat hard  Food Insecurity: Food Insecurity Present (01/20/2023)   Hunger Vital Sign    Worried About Programme researcher, broadcasting/film/video  in the Last Year: Sometimes true    Ran Out of Food in the Last Year: Sometimes true  Transportation Needs: No Transportation Needs (01/20/2023)   PRAPARE - Administrator, Civil Service (Medical): No    Lack of Transportation (Non-Medical): No  Physical Activity: Sufficiently Active (01/20/2023)   Exercise Vital Sign    Days of Exercise per Week: 6 days    Minutes of Exercise per Session: 60 min  Stress: Stress Concern Present (01/20/2023)   [employer] of Occupational Health - Occupational Stress Questionnaire    Feeling of Stress : To some extent  Social Connections: Moderately Integrated (01/20/2023)   Social Connection and Isolation Panel [NHANES]     Frequency of Communication with Friends and Family: More than three times a week    Frequency of Social Gatherings with Friends and Family: Never    Attends Religious Services: 1 to 4 times per year    Active Member of [employer] or Organizations: Yes    Attends Banker Meetings: 1 to 4 times per year    Marital Status: Divorced  Catering manager Violence: Not on file     Outpatient Medications Prior to Visit  Medication Sig Dispense Refill   aspirin EC 81 MG EC tablet Take 1 tablet (81 mg total) by mouth daily. 30 tablet 0   Blood Glucose Monitoring Suppl (FREESTYLE FREEDOM LITE) w/Device KIT      Blood Glucose Monitoring Suppl (FREESTYLE FREEDOM LITE) w/Device KIT Use as directed 1 kit 0   Continuous Blood Gluc Sensor (FREESTYLE LIBRE 3 SENSOR) MISC Place 1 sensor on the skin every 14 days. Use to check glucose continuously 2 each 3   empagliflozin (JARDIANCE) 25 MG TABS tablet Take 1 tablet (25 mg total) by mouth daily. 90 tablet 3   glucose blood (FREESTYLE LITE) test strip Used to check blood sugars once a day. 100 each 4   losartan (COZAAR) 25 MG tablet Take 1 tablet (25 mg total) by mouth once a day 30 tablet 11   meclizine (ANTIVERT) 25 MG tablet Take 2 tablets (50 mg total) by mouth 3 (three) times daily as needed for dizziness 60 tablet 1   ondansetron (ZOFRAN-ODT) 4 MG disintegrating tablet Take 1 tablet (4 mg total) by mouth every 8 (eight) hours as needed for nausea or vomiting. 30 tablet 0   rosuvastatin (CRESTOR) 20 MG tablet Take 1 tablet (20 mg total) by mouth daily. 90 tablet 1   TRUEPLUS LANCETS 30G MISC USE AS DIRECTED 100 each 5   valACYclovir (VALTREX) 500 MG tablet Take 2 tablets (1,000 mg total) by mouth 2 (two) times daily. Take prn cold sore on on day 12 tablet 1   fluconazole (DIFLUCAN) 150 MG tablet Take 1 tablet by mouth once. Repeat in 3 days if needed. 2 tablet 0   phenazopyridine (PYRIDIUM) 100 MG tablet Take 1 tablet (100 mg total) by mouth 3 (three)  times daily as needed for pain. 10 tablet 0   No facility-administered medications prior to visit.    Allergies  Allergen Reactions   Haemophilus B Polysaccharide Vaccine     Per Dr - Pt is to not receive vaccine    Influenza Vaccine Live     Per Dr - Pt is to not receive vaccine    Influenza Vaccines Other (See Comments)    Per Dr - Pt is to not receive vaccine    Semaglutide     Other Reaction(s): Other (See Comments)  Per mbr throat swelling   Hydrocodone Rash   Morphine And Codeine Other (See Comments)    Makes her crazy   Prednisone     "makes me crazy"    ROS Review of Systems Negative unless indicated in HPI.    Objective:    Physical Exam Constitutional:      Appearance: Normal appearance.  Cardiovascular:     Rate and Rhythm: Normal rate and regular rhythm.     Pulses: Normal pulses.     Heart sounds: Normal heart sounds.  Abdominal:     General: Bowel sounds are normal.     Palpations: Abdomen is soft.     Tenderness: There is no abdominal tenderness. There is no right CVA tenderness or left CVA tenderness.  Musculoskeletal:     Cervical back: Normal range of motion.  Neurological:     General: No focal deficit present.     Mental Status: She is alert. Mental status is at baseline.  Psychiatric:        Mood and Affect: Mood normal.        Behavior: Behavior normal.        Thought Content: Thought content normal.        Judgment: Judgment normal.     BP 130/76   Pulse 75   Temp (!) 97.5 F (36.4 C) (Oral)   Ht 5\' 5"  (1.651 m)   Wt 173 lb 9.6 oz (78.7 kg)   LMP 07/24/2007 Comment: ablation in 2008-pt reports she went through menopause after this  SpO2 98%   BMI 28.89 kg/m  Wt Readings from Last 3 Encounters:  01/22/23 173 lb 9.6 oz (78.7 kg)  09/11/22 170 lb 6.4 oz (77.3 kg)  01/26/22 150 lb (68 kg)     Health Maintenance  Topic Date Due   Zoster Vaccines- Shingrix (1 of 2) Never done   PAP SMEAR-Modifier  09/17/2022   OPHTHALMOLOGY  EXAM  12/09/2022   HEMOGLOBIN A1C  08/03/2023   Diabetic kidney evaluation - eGFR measurement  08/14/2023   Diabetic kidney evaluation - Urine ACR  09/12/2023   MAMMOGRAM  02/25/2024   Colonoscopy  01/27/2027   DTaP/Tdap/Td (2 - Td or Tdap) 12/11/2027   HPV VACCINES  Aged Out   INFLUENZA VACCINE  Discontinued   FOOT EXAM  Discontinued   COVID-19 Vaccine  Discontinued   Hepatitis C Screening  Discontinued   HIV Screening  Discontinued    There are no preventive care reminders to display for this patient.  Lab Results  Component Value Date   TSH 1.33 03/21/2021   Lab Results  Component Value Date   WBC 6.7 02/18/2021   HGB 14.2 02/18/2021   HCT 42.1 02/18/2021   MCV 83.9 02/18/2021   PLT 236 02/18/2021   Lab Results  Component Value Date   NA 139 08/13/2022   K 3.7 08/13/2022   CO2 24 08/13/2022   GLUCOSE 126 (H) 08/13/2022   BUN 17 08/13/2022   CREATININE 0.70 08/13/2022   BILITOT 0.5 08/13/2022   ALKPHOS 61 08/13/2022   AST 22 08/13/2022   ALT 19 08/13/2022   PROT 7.9 08/13/2022   ALBUMIN 4.3 08/13/2022   CALCIUM 9.0 08/13/2022   ANIONGAP 8 08/13/2022   GFR 98.10 12/08/2021   Lab Results  Component Value Date   CHOL 178 02/01/2023   Lab Results  Component Value Date   HDL 79 02/01/2023   Lab Results  Component Value Date   LDLCALC 82 02/01/2023  Lab Results  Component Value Date   TRIG 83 02/01/2023   Lab Results  Component Value Date   CHOLHDL 2.3 02/01/2023   Lab Results  Component Value Date   HGBA1C 6.9 (H) 02/01/2023      Assessment & Plan:  UTI symptoms Assessment & Plan: The POCT urinalysis is negative for hematuria or leukocytes and positive for glucose. Will urine culture and microscopy pending. Advised patient to keep [place] posted regarding the symptoms.  Orders: -     POCT urinalysis dipstick -     Urine Culture -     Urinalysis, Routine w reflex microscopic    Follow-up: No follow-ups on file.   Kara Dies, NP

## 2023-01-24 ENCOUNTER — Other Ambulatory Visit: Payer: Self-pay

## 2023-01-24 ENCOUNTER — Other Ambulatory Visit: Payer: Self-pay | Admitting: Nurse Practitioner

## 2023-01-24 LAB — URINE CULTURE
MICRO NUMBER:: 15055590
SPECIMEN QUALITY:: ADEQUATE

## 2023-01-24 MED ORDER — AMOXICILLIN-POT CLAVULANATE 875-125 MG PO TABS
1.0000 | ORAL_TABLET | Freq: Two times a day (BID) | ORAL | 0 refills | Status: AC
  Filled 2023-01-24: qty 14, 7d supply, fill #0

## 2023-01-25 ENCOUNTER — Encounter: Payer: Self-pay | Admitting: Nurse Practitioner

## 2023-01-25 ENCOUNTER — Other Ambulatory Visit: Payer: Self-pay

## 2023-01-26 ENCOUNTER — Encounter: Payer: Self-pay | Admitting: Nurse Practitioner

## 2023-01-26 ENCOUNTER — Other Ambulatory Visit: Payer: Self-pay

## 2023-01-26 DIAGNOSIS — R399 Unspecified symptoms and signs involving the genitourinary system: Secondary | ICD-10-CM

## 2023-01-27 ENCOUNTER — Encounter: Payer: Self-pay | Admitting: Family

## 2023-01-27 ENCOUNTER — Other Ambulatory Visit
Admission: RE | Admit: 2023-01-27 | Discharge: 2023-01-27 | Disposition: A | Discharge status: Home / Self Care | Payer: Commercial Managed Care - PPO | Attending: Nurse Practitioner | Admitting: Nurse Practitioner

## 2023-01-27 DIAGNOSIS — R8290 Unspecified abnormal findings in urine: Secondary | ICD-10-CM | POA: Diagnosis not present

## 2023-01-27 DIAGNOSIS — E119 Type 2 diabetes mellitus without complications: Secondary | ICD-10-CM | POA: Diagnosis not present

## 2023-01-27 LAB — URINALYSIS, COMPLETE (UACMP) WITH MICROSCOPIC
Bilirubin Urine: NEGATIVE
Glucose, UA: 500 mg/dL — AB
Hgb urine dipstick: NEGATIVE
Ketones, ur: NEGATIVE mg/dL
Leukocytes,Ua: NEGATIVE
Nitrite: NEGATIVE
Protein, ur: NEGATIVE mg/dL
Specific Gravity, Urine: 1.01 (ref 1.005–1.030)
pH: 5.5 (ref 5.0–8.0)

## 2023-01-28 LAB — URINE CULTURE: Culture: NO GROWTH

## 2023-01-29 ENCOUNTER — Other Ambulatory Visit: Payer: Self-pay | Admitting: Nurse Practitioner

## 2023-01-29 DIAGNOSIS — R109 Unspecified abdominal pain: Secondary | ICD-10-CM

## 2023-01-29 NOTE — Telephone Encounter (Signed)
Pt called in regarding previous message. As per pt, the E. Coli meds its too strong, and as per pt, her test results shows crystal in urine and yeast. She was wondering what we doing for this issue?? She would like a call back.

## 2023-01-29 NOTE — Telephone Encounter (Signed)
Pt returned call

## 2023-02-01 ENCOUNTER — Other Ambulatory Visit
Admission: RE | Admit: 2023-02-01 | Discharge: 2023-02-01 | Disposition: A | Discharge status: Home / Self Care | Payer: Commercial Managed Care - PPO | Attending: Family | Admitting: Family

## 2023-02-01 DIAGNOSIS — E119 Type 2 diabetes mellitus without complications: Secondary | ICD-10-CM | POA: Diagnosis not present

## 2023-02-01 LAB — HEMOGLOBIN A1C
Hgb A1c MFr Bld: 6.9 % — ABNORMAL HIGH (ref 4.8–5.6)
Mean Plasma Glucose: 151.33 mg/dL

## 2023-02-01 LAB — LIPID PANEL
Cholesterol: 178 mg/dL (ref 0–200)
HDL: 79 mg/dL (ref >40)
LDL Cholesterol: 82 mg/dL (ref 0–99)
Total CHOL/HDL Ratio: 2.3 RATIO
Triglycerides: 83 mg/dL (ref <150)
VLDL: 17 mg/dL (ref 0–40)

## 2023-02-01 NOTE — Telephone Encounter (Signed)
Spoke to pt and she has appt next week to discuss and also fyi she stopped taking the Jardiance last Monday 01/25/23 because of side effects. She would like to know if  there  is something else she should try to replace

## 2023-02-03 ENCOUNTER — Encounter: Payer: Self-pay | Admitting: Podiatry

## 2023-02-03 ENCOUNTER — Ambulatory Visit (INDEPENDENT_AMBULATORY_CARE_PROVIDER_SITE_OTHER): Payer: Commercial Managed Care - PPO | Admitting: Podiatry

## 2023-02-03 DIAGNOSIS — E119 Type 2 diabetes mellitus without complications: Secondary | ICD-10-CM

## 2023-02-03 DIAGNOSIS — L603 Nail dystrophy: Secondary | ICD-10-CM

## 2023-02-03 NOTE — Progress Notes (Signed)
Subjective:  Patient ID: Alyssa Schultz, female    DOB: [birth year],  MRN: [id number] HPI Chief Complaint  Patient presents with   Diabetes    Foot exam - last A1c was 6.9, split toenail hallux left and darkened corner hallux right (medial border), medial hallux calluses (L>R)    New Patient (Initial Visit)    Est pt 2016    57 y.o. female presents with the above complaint.   ROS: Denies fever chills nausea vomit muscle aches pains calf pain back pain chest pain shortness of breath  Past Medical History:  Diagnosis Date   Complication of anesthesia    hard to wake in 2017, patient reports she is 30 lb lighter now   Diabetes mellitus    dx 2014   just take pills   Hyperlipidemia    Hypertension    Neuromuscular disorder [facility])    Past Surgical History:  Procedure Laterality Date   abalsion     ablation     ANTERIOR CERVICAL DECOMP/DISCECTOMY FUSION N/A 09/19/2015   Procedure: ANTERIOR CERVICAL DECOMPRESSION/DISCECTOMY FUSION 2 LEVELS;  Surgeon: Estill Bamberg, MD;  Location: [facility] OR;  Service: Orthopedics;  Laterality: N/A;  Anterior cervical decompression fusion, cervical 3-4, cervical 4-5 decompression with instrumentation and allograft   BACK SURGERY     COLONOSCOPY WITH PROPOFOL N/A 07/18/2021   Procedure: COLONOSCOPY WITH PROPOFOL;  Surgeon: Pasty Spillers, MD;  Location: [facility] ENDOSCOPY;  Service: Endoscopy;  Laterality: N/A;   COLONOSCOPY WITH PROPOFOL N/A 01/26/2022   Procedure: COLONOSCOPY WITH PROPOFOL;  Surgeon: Toney Reil, MD;  Location: [facility] ENDOSCOPY;  Service: Gastroenterology;  Laterality: N/A;   FACIAL RECONSTRUCTION SURGERY     TRANSFORAMINAL LUMBAR INTERBODY FUSION (TLIF) WITH PEDICLE SCREW FIXATION 2 LEVEL Right 07/26/2019   Procedure: RIGHT-SIDED LUMBAR 5 - SACRUM 1 TRANSFORAMINAL LUMBAR INTERBODY FUSION WITH INSTRUMENTATION AND ALLOGRAFT;  Surgeon: Estill Bamberg, MD;  Location: [facility] OR;  Service: Orthopedics;  Laterality: Right;    Current Outpatient  Medications:    aspirin EC 81 MG EC tablet, Take 1 tablet (81 mg total) by mouth daily., Disp: 30 tablet, Rfl: 0   Blood Glucose Monitoring Suppl (FREESTYLE FREEDOM LITE) w/Device KIT, , Disp: , Rfl:    Blood Glucose Monitoring Suppl (FREESTYLE FREEDOM LITE) w/Device KIT, Use as directed, Disp: 1 kit, Rfl: 0   Continuous Blood Gluc Sensor (FREESTYLE LIBRE 3 SENSOR) MISC, Place 1 sensor on the skin every 14 days. Use to check glucose continuously, Disp: 2 each, Rfl: 3   empagliflozin (JARDIANCE) 25 MG TABS tablet, Take 1 tablet (25 mg total) by mouth daily., Disp: 90 tablet, Rfl: 3   glucose blood (FREESTYLE LITE) test strip, Used to check blood sugars once a day., Disp: 100 each, Rfl: 4   losartan (COZAAR) 25 MG tablet, Take 1 tablet (25 mg total) by mouth once a day, Disp: 30 tablet, Rfl: 11   meclizine (ANTIVERT) 25 MG tablet, Take 2 tablets (50 mg total) by mouth 3 (three) times daily as needed for dizziness, Disp: 60 tablet, Rfl: 1   ondansetron (ZOFRAN-ODT) 4 MG disintegrating tablet, Take 1 tablet (4 mg total) by mouth every 8 (eight) hours as needed for nausea or vomiting., Disp: 30 tablet, Rfl: 0   rosuvastatin (CRESTOR) 20 MG tablet, Take 1 tablet (20 mg total) by mouth daily., Disp: 90 tablet, Rfl: 1   TRUEPLUS LANCETS 30G MISC, USE AS DIRECTED, Disp: 100 each, Rfl: 5   valACYclovir (VALTREX) 500 MG tablet, Take 2 tablets (1,000  mg total) by mouth 2 (two) times daily. Take prn cold sore on on day, Disp: 12 tablet, Rfl: 1  Allergies  Allergen Reactions   Haemophilus B Polysaccharide Vaccine     Per Dr - Pt is to not receive vaccine    Influenza Vaccine Live     Per Dr - Pt is to not receive vaccine    Influenza Vaccines Other (See Comments)    Per Dr - Pt is to not receive vaccine    Semaglutide     Other Reaction(s): Other (See Comments)  Per mbr throat swelling   Hydrocodone Rash   Morphine And Codeine Other (See Comments)    Makes her crazy   Prednisone     "makes me crazy"    Review of Systems Objective:  There were no vitals filed for this visit.  General: Well developed, nourished, in no acute distress, alert and oriented x3   Dermatological: Skin is warm, dry and supple bilateral. Nails x 10 are well maintained; remaining integument appears unremarkable at this time. There are no open sores, no preulcerative lesions, no rash or signs of infection present.  Dry scaly skin to the plantar aspect of the first metatarsophalangeal joint the hallux and the medial arch on the right foot only.  She also has thickening of toenails with the nail split hallux left fourth toe on the right foot and hallux right.  Vascular: Dorsalis Pedis artery and Posterior Tibial artery pedal pulses are 2/4 bilateral with immedate capillary fill time. Pedal hair growth present. No varicosities and no lower extremity edema present bilateral.   Neruologic: Grossly intact via light touch bilateral. Vibratory intact via tuning fork bilateral. Protective threshold with Semmes Wienstein monofilament intact to all pedal sites bilateral. Patellar and Achilles deep tendon reflexes 2+ bilateral. No Babinski or clonus noted bilateral.   Musculoskeletal: No gross boney pedal deformities bilateral. No pain, crepitus, or limitation noted with foot and ankle range of motion bilateral. Muscular strength 5/5 in all groups tested bilateral.  Gait: Unassisted, Nonantalgic.    Radiographs:  None taken  Assessment & Plan:   Assessment: Diabetes mellitus noncomplicated.  Possible nail dystrophy  Plan: Debrided portions of the nail today to be sent for pathologic evaluation I did recommend biotin for the splitting of her nails.  We discussed diabetic foot health and I will follow-up with her in 1 month     Alyssa Schultz, [place]

## 2023-02-04 DIAGNOSIS — R399 Unspecified symptoms and signs involving the genitourinary system: Secondary | ICD-10-CM | POA: Insufficient documentation

## 2023-02-04 NOTE — Telephone Encounter (Signed)
Per Kara Dies, NP she spoke with pt and messages regarding urine culture can be completed with no further follow up.

## 2023-02-04 NOTE — Assessment & Plan Note (Signed)
The POCT urinalysis is negative for hematuria or leukocytes and positive for glucose. Will urine culture and microscopy pending. Advised patient to keep [place] posted regarding the symptoms.

## 2023-02-05 ENCOUNTER — Ambulatory Visit
Admission: RE | Admit: 2023-02-05 | Discharge: 2023-02-05 | Disposition: A | Discharge status: Home / Self Care | Payer: Commercial Managed Care - PPO | Source: Ambulatory Visit | Attending: Nurse Practitioner | Admitting: Nurse Practitioner

## 2023-02-05 DIAGNOSIS — R109 Unspecified abdominal pain: Secondary | ICD-10-CM | POA: Insufficient documentation

## 2023-02-05 DIAGNOSIS — K5730 Diverticulosis of large intestine without perforation or abscess without bleeding: Secondary | ICD-10-CM | POA: Diagnosis not present

## 2023-02-05 DIAGNOSIS — K449 Diaphragmatic hernia without obstruction or gangrene: Secondary | ICD-10-CM | POA: Diagnosis not present

## 2023-02-08 ENCOUNTER — Encounter: Payer: Self-pay | Admitting: Family

## 2023-02-08 NOTE — Telephone Encounter (Signed)
Do you have any OTC suggestions?

## 2023-02-10 ENCOUNTER — Other Ambulatory Visit: Payer: Self-pay | Admitting: Nurse Practitioner

## 2023-02-10 DIAGNOSIS — N2889 Other specified disorders of kidney and ureter: Secondary | ICD-10-CM | POA: Insufficient documentation

## 2023-02-12 ENCOUNTER — Encounter: Payer: Self-pay | Admitting: Family

## 2023-02-12 ENCOUNTER — Other Ambulatory Visit: Payer: Self-pay

## 2023-02-12 ENCOUNTER — Ambulatory Visit: Payer: Commercial Managed Care - PPO | Admitting: Family

## 2023-02-12 VITALS — BP 116/64 | HR 76 | Temp 97.3°F | Ht 65.0 in | Wt 174.0 lb

## 2023-02-12 DIAGNOSIS — I10 Essential (primary) hypertension: Secondary | ICD-10-CM | POA: Diagnosis not present

## 2023-02-12 DIAGNOSIS — Z7984 Long term (current) use of oral hypoglycemic drugs: Secondary | ICD-10-CM

## 2023-02-12 DIAGNOSIS — E119 Type 2 diabetes mellitus without complications: Secondary | ICD-10-CM | POA: Diagnosis not present

## 2023-02-12 LAB — MICROALBUMIN / CREATININE URINE RATIO
Creatinine,U: 39.6 mg/dL
Microalb Creat Ratio: 1.8 mg/g (ref 0.0–30.0)
Microalb, Ur: <0.7 mg/dL (ref 0.0–1.9)

## 2023-02-12 MED ORDER — METFORMIN HCL ER 500 MG PO TB24
500.0000 mg | ORAL_TABLET | Freq: Every evening | ORAL | 3 refills | Status: DC
  Filled 2023-02-12: qty 90, 90d supply, fill #0
  Filled 2023-05-08: qty 90, 90d supply, fill #1

## 2023-02-12 NOTE — Assessment & Plan Note (Addendum)
Lab Results  Component Value Date   HGBA1C 6.9 (H) 02/01/2023   Patient has stopped Jardiance.  Allergic to Rybelsus.  She is agreeable to resuming metformin to aid in lowering A1c , goal closer to 6.5.  Start metformin 500 mg daily.  She will consider Jardiance in the future.  Pending urine microalbumin.

## 2023-02-12 NOTE — Patient Instructions (Signed)
Start metformin as discussed

## 2023-02-12 NOTE — Assessment & Plan Note (Signed)
Chronic, stable. Continue losartan 25mg qd 

## 2023-02-12 NOTE — Progress Notes (Signed)
Assessment & Plan:  Controlled type 2 diabetes mellitus without complication, without long-term current use of insulin (HCC) Assessment & Plan: Lab Results  Component Value Date   HGBA1C 6.9 (H) 02/01/2023   Patient has stopped Jardiance.  Allergic to Rybelsus.  She is agreeable to resuming metformin to aid in lowering A1c , goal closer to 6.5.  Start metformin 500 mg daily.  She will consider Jardiance in the future.  Pending urine microalbumin.  Orders: -     Microalbumin / creatinine urine ratio -     metFORMIN HCl ER; Take 1 tablet (500 mg total) by mouth every evening.  Dispense: 90 tablet; Refill: 3  Primary hypertension Assessment & Plan: Chronic, stable. Continue losartan 25mg  qd      Return precautions given.   Risks, benefits, and alternatives of the medications and treatment plan prescribed today were discussed, and patient expressed understanding.   Education regarding symptom management and diagnosis given to patient on AVS either electronically or printed.  No follow-ups on file.  Rennie Plowman, FNP  Subjective:    Patient ID: Alyssa Schultz, female    DOB: [birth year], 57 y.o.   MRN: [id number]  CC: Alyssa Schultz is a 57 y.o. female who presents today for follow up.   HPI: Overall feels well today.  No new concerns.  She is concerned that Jardiance perhaps caused a UTI or renal stone.   She discontinued Jardiance 19 days ago.  Pelvic pain, hematuria has resolved.  She had been on metformin and would like to resume.  She endorses dietary indiscretion of late.   No RBCs ;  Ca Oxalate present UA 01/27/23  CT renal 02/08/2023 with Right pelviectasis and lower pole caliectasis, diverticulosis.  Allergies: Haemophilus b polysaccharide vaccine, Influenza vaccine live, Influenza vaccines, Semaglutide, Hydrocodone, Morphine and codeine, and Prednisone Current Outpatient Medications on File Prior to Visit  Medication Sig Dispense Refill   aspirin EC 81 MG EC  tablet Take 1 tablet (81 mg total) by mouth daily. 30 tablet 0   Blood Glucose Monitoring Suppl (FREESTYLE FREEDOM LITE) w/Device KIT      Blood Glucose Monitoring Suppl (FREESTYLE FREEDOM LITE) w/Device KIT Use as directed 1 kit 0   Continuous Blood Gluc Sensor (FREESTYLE LIBRE 3 SENSOR) MISC Place 1 sensor on the skin every 14 days. Use to check glucose continuously 2 each 3   glucose blood (FREESTYLE LITE) test strip Used to check blood sugars once a day. 100 each 4   losartan (COZAAR) 25 MG tablet Take 1 tablet (25 mg total) by mouth once a day 30 tablet 11   meclizine (ANTIVERT) 25 MG tablet Take 2 tablets (50 mg total) by mouth 3 (three) times daily as needed for dizziness 60 tablet 1   ondansetron (ZOFRAN-ODT) 4 MG disintegrating tablet Take 1 tablet (4 mg total) by mouth every 8 (eight) hours as needed for nausea or vomiting. 30 tablet 0   rosuvastatin (CRESTOR) 20 MG tablet Take 1 tablet (20 mg total) by mouth daily. 90 tablet 1   TRUEPLUS LANCETS 30G MISC USE AS DIRECTED 100 each 5   valACYclovir (VALTREX) 500 MG tablet Take 2 tablets (1,000 mg total) by mouth 2 (two) times daily. Take prn cold sore on on day 12 tablet 1   No current facility-administered medications on file prior to visit.    Review of Systems  Constitutional:  Negative for chills and fever.  Respiratory:  Negative for cough.   Cardiovascular:  Negative  for chest pain and palpitations.  Gastrointestinal:  Negative for nausea and vomiting.      Objective:    BP 116/64   Pulse 76   Temp (!) 97.3 F (36.3 C) (Temporal)   Ht 5\' 5"  (1.651 m)   Wt 174 lb (78.9 kg)   LMP 07/24/2007 Comment: ablation in 2008-pt reports she went through menopause after this  SpO2 96%   BMI 28.96 kg/m  BP Readings from Last 3 Encounters:  02/12/23 116/64  01/22/23 130/76  09/11/22 132/80   Wt Readings from Last 3 Encounters:  02/12/23 174 lb (78.9 kg)  01/22/23 173 lb 9.6 oz (78.7 kg)  09/11/22 170 lb 6.4 oz (77.3 kg)     Physical Exam Vitals reviewed.  Constitutional:      Appearance: She is well-developed.  Eyes:     Conjunctiva/sclera: Conjunctivae normal.  Cardiovascular:     Rate and Rhythm: Normal rate and regular rhythm.     Pulses: Normal pulses.     Heart sounds: Normal heart sounds.  Pulmonary:     Effort: Pulmonary effort is normal.     Breath sounds: Normal breath sounds. No wheezing, rhonchi or rales.  Skin:    General: Skin is warm and dry.  Neurological:     Mental Status: She is alert.  Psychiatric:        Speech: Speech normal.        Behavior: Behavior normal.        Thought Content: Thought content normal.

## 2023-02-14 ENCOUNTER — Encounter: Payer: Self-pay | Admitting: Podiatry

## 2023-02-15 ENCOUNTER — Other Ambulatory Visit: Payer: Self-pay

## 2023-02-15 DIAGNOSIS — Z87448 Personal history of other diseases of urinary system: Secondary | ICD-10-CM

## 2023-02-16 ENCOUNTER — Encounter: Payer: Self-pay | Admitting: Family

## 2023-02-16 ENCOUNTER — Telehealth: Payer: Self-pay | Admitting: Podiatry

## 2023-02-16 ENCOUNTER — Other Ambulatory Visit: Payer: Self-pay

## 2023-02-16 ENCOUNTER — Ambulatory Visit: Payer: Commercial Managed Care - PPO | Admitting: Urology

## 2023-02-16 ENCOUNTER — Other Ambulatory Visit
Admission: RE | Admit: 2023-02-16 | Discharge: 2023-02-16 | Disposition: A | Discharge status: Home / Self Care | Payer: Commercial Managed Care - PPO | Attending: Urology | Admitting: Urology

## 2023-02-16 ENCOUNTER — Encounter: Payer: Self-pay | Admitting: Urology

## 2023-02-16 VITALS — BP 126/71 | HR 78 | Ht 65.5 in | Wt 172.0 lb

## 2023-02-16 DIAGNOSIS — N390 Urinary tract infection, site not specified: Secondary | ICD-10-CM

## 2023-02-16 DIAGNOSIS — Z87440 Personal history of urinary (tract) infections: Secondary | ICD-10-CM

## 2023-02-16 DIAGNOSIS — R3129 Other microscopic hematuria: Secondary | ICD-10-CM

## 2023-02-16 DIAGNOSIS — Z87448 Personal history of other diseases of urinary system: Secondary | ICD-10-CM | POA: Insufficient documentation

## 2023-02-16 DIAGNOSIS — R310 Gross hematuria: Secondary | ICD-10-CM | POA: Diagnosis not present

## 2023-02-16 DIAGNOSIS — N958 Other specified menopausal and perimenopausal disorders: Secondary | ICD-10-CM

## 2023-02-16 LAB — URINALYSIS, COMPLETE (UACMP) WITH MICROSCOPIC
Bilirubin Urine: NEGATIVE
Glucose, UA: NEGATIVE mg/dL
Hgb urine dipstick: NEGATIVE
Ketones, ur: NEGATIVE mg/dL
Leukocytes,Ua: NEGATIVE
Nitrite: NEGATIVE
Protein, ur: NEGATIVE mg/dL
Specific Gravity, Urine: 1.02 (ref 1.005–1.030)
pH: 5.5 (ref 5.0–8.0)

## 2023-02-16 MED ORDER — ESTRADIOL 0.1 MG/GM VA CREA
TOPICAL_CREAM | VAGINAL | 12 refills | Status: DC
  Filled 2023-02-16: qty 42.5, 90d supply, fill #0

## 2023-02-16 NOTE — Telephone Encounter (Signed)
Pt received her results and saw that she has a nail fungus and wanted a medication for her fungus.   Please advise

## 2023-02-16 NOTE — Progress Notes (Signed)
02/16/23 9:57 AM   Lane Hacker [birth year] [id number]  CC: Gross hematuria, recurrent UTI, right pelviectasis  HPI: 57 year old female with history of recurrent UTIs with 3-5 infections per year who reports an episode of significant hematuria in early June 2024.  Urine culture was checked at that time and did show E. coli, and hematuria resolved after antibiotics.  She denied significant UTI symptoms at that time of dysuria or pelvic pain.  She denies any urinary symptoms when she does not have a UTI.  She does have a history of microscopic hematuria with 6-10 RBCs seen on prior urinalysis.  Urinalysis today does show 6-10 RBC but otherwise benign.  She denies any right-sided flank pain.  A CT stone protocol was ordered by PCP for the episode of gross hematuria, and showed no evidence of stones, and some very minimal right pelviectasis, that on my review appears relatively stable from a prior renal ultrasound.  Renal function is normal.   PMH: Past Medical History:  Diagnosis Date   Complication of anesthesia    hard to wake in 2017, patient reports she is 30 lb lighter now   Diabetes mellitus    dx 2014   just take pills   Hyperlipidemia    Hypertension    Neuromuscular disorder [facility])     Surgical History: Past Surgical History:  Procedure Laterality Date   abalsion     ablation     ANTERIOR CERVICAL DECOMP/DISCECTOMY FUSION N/A 09/19/2015   Procedure: ANTERIOR CERVICAL DECOMPRESSION/DISCECTOMY FUSION 2 LEVELS;  Surgeon: Estill Bamberg, MD;  Location: [facility] OR;  Service: Orthopedics;  Laterality: N/A;  Anterior cervical decompression fusion, cervical 3-4, cervical 4-5 decompression with instrumentation and allograft   BACK SURGERY     COLONOSCOPY WITH PROPOFOL N/A 07/18/2021   Procedure: COLONOSCOPY WITH PROPOFOL;  Surgeon: Pasty Spillers, MD;  Location: [facility] ENDOSCOPY;  Service: Endoscopy;  Laterality: N/A;   COLONOSCOPY WITH PROPOFOL N/A 01/26/2022   Procedure: COLONOSCOPY  WITH PROPOFOL;  Surgeon: Toney Reil, MD;  Location: [facility] ENDOSCOPY;  Service: Gastroenterology;  Laterality: N/A;   FACIAL RECONSTRUCTION SURGERY     TRANSFORAMINAL LUMBAR INTERBODY FUSION (TLIF) WITH PEDICLE SCREW FIXATION 2 LEVEL Right 07/26/2019   Procedure: RIGHT-SIDED LUMBAR 5 - SACRUM 1 TRANSFORAMINAL LUMBAR INTERBODY FUSION WITH INSTRUMENTATION AND ALLOGRAFT;  Surgeon: Estill Bamberg, MD;  Location: [facility] OR;  Service: Orthopedics;  Laterality: Right;     Family History: Family History  Problem Relation Age of Onset   Diabetes Mother    Diabetes Maternal Grandmother    Hypertension Maternal Grandfather    Breast cancer Neg Hx    Thyroid cancer Neg Hx     Social History:  reports that she has never smoked. She has never used smokeless tobacco. She reports current alcohol use. She reports that she does not use drugs.  Physical Exam: BP 126/71 (BP Location: Left Arm, Patient Position: Sitting, Cuff Size: Normal)   Pulse 78   Ht 5' 5.5" (1.664 m)   Wt 172 lb (78 kg)   LMP 07/24/2007 Comment: ablation in 2008-pt reports she went through menopause after this  BMI 28.19 kg/m    Constitutional:  Alert and oriented, No acute distress. Cardiovascular: No clubbing, cyanosis, or edema. Respiratory: Normal respiratory effort, no increased work of breathing. GI: Abdomen is soft, nontender, nondistended, no abdominal masses   Laboratory Data: Reviewed, see  Pertinent Imaging: I have personally viewed and interpreted the CT stone protocol showing minimal right-sided pelviectasis with no stones  or frank hydronephrosis, on review of the prior renal ultrasound from 2021 this appears relatively stable.  Assessment & Plan:   57 year old female with episode of gross hematuria without frank UTI symptoms in early June 2024, resolved with antibiotics, CT stone protocol at that time essentially benign with some very minimal right pelviectasis that appears physiologic and stable from a  prior renal ultrasound in 2021.  No right-sided flank pain, urinalysis today with persistent microscopic hematuria with 6-10 RBC.  Unclear if gross hematuria was related to infection versus asymptomatic.  We reviewed the AUA guidelines regarding workup of either gross hematuria or microscopic hematuria.  With the CT findings and persistent microscopic hematuria today, I think it would be reasonable to pursue a CT urogram, and could consider cystoscopy if any abnormal findings on CT.  We discussed the evaluation and treatment of patients with recurrent UTIs at length.  We specifically discussed the differences between asymptomatic bacteriuria and true urinary tract infection.  We discussed the AUA definition of recurrent UTI of at least 2 culture proven symptomatic acute cystitis episodes in a 65-month period, or 3 within a 1 year period.  We discussed the importance of culture directed antibiotic treatment, and antibiotic stewardship.  First-line therapy includes nitrofurantoin(5 days), Bactrim(3 days), or fosfomycin(3 g single dose).  Possible etiologies of recurrent infection include periurethral tissue atrophy in postmenopausal woman, constipation, sexual activity, incomplete emptying, anatomic abnormalities, and even genetic predisposition.  Finally, we discussed the role of perineal hygiene, timed voiding, adequate hydration, topical vaginal estrogen, cranberry prophylaxis, and low-dose antibiotic prophylaxis.  CT urogram, call with results, if normal RTC 4 months symptom check on the topical estrogen Trial of topical estrogen cream for recurrent UTI  Legrand Rams, MD 02/16/2023  [facility] [address], [address] [place], [place] [address] [phone]

## 2023-02-17 ENCOUNTER — Other Ambulatory Visit: Payer: Self-pay

## 2023-02-17 ENCOUNTER — Telehealth: Payer: Self-pay | Admitting: Podiatry

## 2023-02-17 MED ORDER — CICLOPIROX 8 % EX SOLN
1.0000 | Freq: Every day | CUTANEOUS | 11 refills | Status: DC
  Filled 2023-02-17: qty 6.6, 30d supply, fill #0

## 2023-02-17 NOTE — Telephone Encounter (Signed)
Rx sent for Penlac 

## 2023-02-17 NOTE — Telephone Encounter (Signed)
Pt called and left message today at 903am stating she seen thru my chart that she has a fungus and she is scheduled to see Dr Al Corpus 7.24 and could not some sooner due to work. She is asking if there is something she could start using/taking for the fungus now until she has her appt. Please advise

## 2023-02-19 ENCOUNTER — Other Ambulatory Visit: Payer: Self-pay

## 2023-02-19 ENCOUNTER — Ambulatory Visit
Admission: RE | Admit: 2023-02-19 | Discharge: 2023-02-19 | Disposition: A | Discharge status: Home / Self Care | Payer: Commercial Managed Care - PPO | Source: Ambulatory Visit | Attending: Urology | Admitting: Urology

## 2023-02-19 ENCOUNTER — Encounter: Payer: Self-pay | Admitting: Family

## 2023-02-19 DIAGNOSIS — M545 Low back pain, unspecified: Secondary | ICD-10-CM | POA: Diagnosis not present

## 2023-02-19 DIAGNOSIS — R310 Gross hematuria: Secondary | ICD-10-CM | POA: Diagnosis not present

## 2023-02-19 DIAGNOSIS — M7062 Trochanteric bursitis, left hip: Secondary | ICD-10-CM | POA: Diagnosis not present

## 2023-02-19 DIAGNOSIS — R319 Hematuria, unspecified: Secondary | ICD-10-CM | POA: Diagnosis not present

## 2023-02-19 MED ORDER — NYSTATIN 100000 UNIT/GM EX POWD
1.0000 | Freq: Three times a day (TID) | CUTANEOUS | 0 refills | Status: DC
  Filled 2023-02-19: qty 15, 5d supply, fill #0

## 2023-02-19 MED ORDER — CELECOXIB 200 MG PO CAPS
200.0000 mg | ORAL_CAPSULE | Freq: Two times a day (BID) | ORAL | 0 refills | Status: DC
  Filled 2023-02-19: qty 60, 30d supply, fill #0

## 2023-02-19 MED ORDER — SODIUM CHLORIDE 0.9 % IV SOLN: INTRAVENOUS | Status: DC

## 2023-02-19 MED ORDER — IOHEXOL 300 MG/ML  SOLN
100.0000 mL | Freq: Once | INTRAMUSCULAR | Status: AC | PRN
  Administered 2023-02-19: 100 mL via INTRAVENOUS

## 2023-02-19 NOTE — Telephone Encounter (Signed)
Pt would like to know if she can get a rx for nystatin powder or cream.

## 2023-02-22 ENCOUNTER — Other Ambulatory Visit: Payer: Self-pay

## 2023-02-22 MED ORDER — LOSARTAN POTASSIUM 25 MG PO TABS
ORAL_TABLET | ORAL | 11 refills | Status: DC
  Filled 2023-02-22: qty 30, 30d supply, fill #0
  Filled 2023-03-19: qty 30, 30d supply, fill #1
  Filled 2023-04-21: qty 30, 30d supply, fill #2
  Filled 2023-05-18: qty 27, 27d supply, fill #3
  Filled 2023-05-18: qty 30, 30d supply, fill #3
  Filled 2023-05-18: qty 3, 3d supply, fill #3
  Filled 2023-06-23: qty 30, 30d supply, fill #4
  Filled 2023-07-23 (×2): qty 30, 30d supply, fill #5
  Filled 2023-09-13: qty 30, 30d supply, fill #6
  Filled 2023-10-04: qty 30, 30d supply, fill #7
  Filled 2023-11-07: qty 30, 30d supply, fill #8
  Filled 2023-12-07: qty 30, 30d supply, fill #9
  Filled 2024-01-04: qty 30, 30d supply, fill #10
  Filled 2024-02-01: qty 30, 30d supply, fill #11

## 2023-03-01 ENCOUNTER — Encounter: Payer: Self-pay | Admitting: Family

## 2023-03-02 ENCOUNTER — Encounter: Payer: Self-pay | Admitting: Family

## 2023-03-03 ENCOUNTER — Other Ambulatory Visit: Payer: Self-pay | Admitting: Family

## 2023-03-03 DIAGNOSIS — I700 Atherosclerosis of aorta: Secondary | ICD-10-CM

## 2023-03-09 ENCOUNTER — Other Ambulatory Visit: Payer: Self-pay

## 2023-03-09 ENCOUNTER — Other Ambulatory Visit: Payer: Self-pay | Admitting: Gastroenterology

## 2023-03-09 ENCOUNTER — Other Ambulatory Visit: Payer: Self-pay | Admitting: Family

## 2023-03-09 DIAGNOSIS — K5909 Other constipation: Secondary | ICD-10-CM

## 2023-03-09 MED ORDER — NYSTATIN 100000 UNIT/GM EX CREA
1.0000 | TOPICAL_CREAM | Freq: Two times a day (BID) | CUTANEOUS | 1 refills | Status: DC
  Filled 2023-03-09: qty 30, 15d supply, fill #0

## 2023-03-09 MED ORDER — LINACLOTIDE 72 MCG PO CAPS
72.0000 ug | ORAL_CAPSULE | Freq: Every day | ORAL | 0 refills | Status: AC
Start: 2023-03-09 — End: 2024-07-10
  Filled 2023-03-09: qty 90, 90d supply, fill #0

## 2023-03-10 ENCOUNTER — Other Ambulatory Visit: Payer: Self-pay

## 2023-03-10 ENCOUNTER — Ambulatory Visit: Payer: Commercial Managed Care - PPO | Admitting: Podiatry

## 2023-03-10 DIAGNOSIS — R399 Unspecified symptoms and signs involving the genitourinary system: Secondary | ICD-10-CM

## 2023-03-10 NOTE — Telephone Encounter (Signed)
Spoke to pt and she is aware to get urine sample done and schedule VV with pcp

## 2023-03-11 ENCOUNTER — Other Ambulatory Visit
Admission: RE | Admit: 2023-03-11 | Discharge: 2023-03-11 | Disposition: A | Discharge status: Home / Self Care | Payer: Commercial Managed Care - PPO | Attending: Family | Admitting: Family

## 2023-03-11 DIAGNOSIS — R399 Unspecified symptoms and signs involving the genitourinary system: Secondary | ICD-10-CM | POA: Insufficient documentation

## 2023-03-11 LAB — URINALYSIS, ROUTINE W REFLEX MICROSCOPIC
Bilirubin Urine: NEGATIVE
Glucose, UA: NEGATIVE mg/dL
Ketones, ur: NEGATIVE mg/dL
Leukocytes,Ua: NEGATIVE
Nitrite: NEGATIVE
Protein, ur: NEGATIVE mg/dL
Specific Gravity, Urine: 1.02 (ref 1.005–1.030)
pH: 5.5 (ref 5.0–8.0)

## 2023-03-11 LAB — URINALYSIS, MICROSCOPIC (REFLEX)
Bacteria, UA: NONE SEEN
Squamous Epithelial / HPF: NONE SEEN /HPF (ref 0–5)
WBC, UA: NONE SEEN WBC/hpf (ref 0–5)

## 2023-03-11 NOTE — Addendum Note (Signed)
Addended by: Caroline Sauger on: 03/11/2023 08:02 AM   Modules accepted: Orders

## 2023-03-14 ENCOUNTER — Telehealth: Payer: Commercial Managed Care - PPO | Admitting: Nurse Practitioner

## 2023-03-14 ENCOUNTER — Encounter: Payer: Self-pay | Admitting: Family

## 2023-03-14 DIAGNOSIS — H109 Unspecified conjunctivitis: Secondary | ICD-10-CM

## 2023-03-14 MED ORDER — POLYMYXIN B-TRIMETHOPRIM 10000-0.1 UNIT/ML-% OP SOLN
1.0000 [drp] | OPHTHALMIC | 0 refills | Status: DC
Start: 2023-03-14 — End: 2023-09-30

## 2023-03-14 NOTE — Progress Notes (Signed)
I have spent 5 minutes in review of e-visit questionnaire, review and updating patient chart, medical decision making and response to patient.  ° °Zelda W Fleming, NP ° °  °

## 2023-03-14 NOTE — Progress Notes (Signed)
E-Visit for Pink Eye ? ? ?We are sorry that you are not feeling well.  Here is how we plan to help! ? ?Based on what you have shared with me it looks like you have conjunctivitis.  Conjunctivitis is a common inflammatory or infectious condition of the eye that is often referred to as "pink eye".  In most cases it is contagious (viral or bacterial). However, not all conjunctivitis requires antibiotics (ex. Allergic).  We have made appropriate suggestions for you based upon your presentation. ? ?I have prescribed Polytrim Ophthalmic drops 1-2 drops 4 times a day times 5 days ? ?Pink eye can be highly contagious.  It is typically spread through direct contact with secretions, or contaminated objects or surfaces that one may have touched.  Strict handwashing is suggested with soap and water is urged.  If not available, use alcohol based had sanitizer.  Avoid unnecessary touching of the eye.  If you wear contact lenses, you will need to refrain from wearing them until you see no white discharge from the eye for at least 24 hours after being on medication.  You should see symptom improvement in 1-2 days after starting the medication regimen.  Call us if symptoms are not improved in 1-2 days. ? ?Home Care: ?Wash your hands often! ?Do not wear your contacts until you complete your treatment plan. ?Avoid sharing towels, bed linen, personal items with a person who has pink eye. ?See attention for anyone in your home with similar symptoms. ? ?Get Help Right Away If: ?Your symptoms do not improve. ?You develop blurred or loss of vision. ?Your symptoms worsen (increased discharge, pain or redness) ? ? ?Thank you for choosing an e-visit. ? ?Your e-visit answers were reviewed by a board certified advanced clinical practitioner to complete your personal care plan. Depending upon the condition, your plan could have included both over the counter or prescription medications. ? ?Please review your pharmacy choice. Make sure the  pharmacy is open so you can pick up prescription now. If there is a problem, you may contact your provider through MyChart messaging and have the prescription routed to another pharmacy.  Your safety is important to us. If you have drug allergies check your prescription carefully.  ? ?For the next 24 hours you can use MyChart to ask questions about today's visit, request a non-urgent call back, or ask for a work or school excuse. ?You will get an email in the next two days asking about your experience. I hope that your e-visit has been valuable and will speed your recovery. ? ?

## 2023-03-16 ENCOUNTER — Other Ambulatory Visit: Payer: Self-pay | Admitting: Family

## 2023-03-16 DIAGNOSIS — E785 Hyperlipidemia, unspecified: Secondary | ICD-10-CM

## 2023-03-17 ENCOUNTER — Ambulatory Visit: Payer: Commercial Managed Care - PPO | Admitting: Podiatry

## 2023-03-18 ENCOUNTER — Other Ambulatory Visit: Payer: Self-pay | Admitting: Family

## 2023-03-18 ENCOUNTER — Other Ambulatory Visit: Payer: Self-pay

## 2023-03-18 DIAGNOSIS — E785 Hyperlipidemia, unspecified: Secondary | ICD-10-CM

## 2023-03-19 ENCOUNTER — Other Ambulatory Visit: Payer: Self-pay

## 2023-03-19 MED FILL — Rosuvastatin Calcium Tab 20 MG: ORAL | 90 days supply | Qty: 90 | Fill #0 | Status: AC

## 2023-03-21 ENCOUNTER — Other Ambulatory Visit: Payer: Self-pay

## 2023-03-24 ENCOUNTER — Other Ambulatory Visit: Payer: Self-pay

## 2023-03-24 MED ORDER — CYCLOBENZAPRINE HCL 5 MG PO TABS
5.0000 mg | ORAL_TABLET | Freq: Every evening | ORAL | 1 refills | Status: DC | PRN
  Filled 2023-03-24: qty 45, 22d supply, fill #0
  Filled 2023-03-24: qty 15, 8d supply, fill #0

## 2023-03-24 MED ORDER — TRAMADOL HCL 50 MG PO TABS
50.0000 mg | ORAL_TABLET | Freq: Three times a day (TID) | ORAL | 0 refills | Status: DC | PRN
  Filled 2023-03-24: qty 60, 20d supply, fill #0

## 2023-03-26 ENCOUNTER — Telehealth: Payer: Commercial Managed Care - PPO | Admitting: Family

## 2023-03-26 ENCOUNTER — Telehealth: Payer: Self-pay | Admitting: Family

## 2023-03-26 ENCOUNTER — Encounter: Payer: Self-pay | Admitting: Family

## 2023-03-26 VITALS — BP 123/73 | Ht 63.5 in | Wt 173.1 lb

## 2023-03-26 DIAGNOSIS — R3129 Other microscopic hematuria: Secondary | ICD-10-CM

## 2023-03-26 DIAGNOSIS — M25552 Pain in left hip: Secondary | ICD-10-CM | POA: Diagnosis not present

## 2023-03-26 DIAGNOSIS — R319 Hematuria, unspecified: Secondary | ICD-10-CM | POA: Insufficient documentation

## 2023-03-26 NOTE — Patient Instructions (Signed)
Nice to see you!   

## 2023-03-26 NOTE — Progress Notes (Signed)
Virtual Visit via Video Note  I connected with Alyssa Schultz on 03/26/23 at  3:30 PM EDT by a video enabled telemedicine application and verified that I am speaking with the correct person using two identifiers. Location patient: home Location provider: work  Persons participating in the virtual visit: patient, provider  I discussed the limitations of evaluation and management by telemedicine and the availability of in person appointments. The patient expressed understanding and agreed to proceed.  HPI: She complains of low back pain , left side lateral hip which radiates to left thigh to left knee She had steroid injection and diagnosed with trochanteric bursitis. She reports she had xrays of lumbar and pelvic.  Seen by [facility] spine Dr Waynard Reeds and she was prescribed tramadol and flexeril. She was prescribed home exercises. She is walking 1 mile at lunch.  Ice pack with relief.  Tylenol 325mg  during the day and tylenol 500mg  qpm.   Urine doesn't smell. Denies fever, urinary frequency, urinary or stool incontinence, numbness in legs.   Fasting blood sugar 110-135.   CT calcium score scheduled 04/16/2023  Urinalysis 2 weeks ago positive red blood cells, negative white blood cells bacteria.  bland urine culture  1 month ago microalbumin < 0.7 ROS: See pertinent positives and negatives per HPI.  EXAM:  VITALS per patient if applicable: BP 123/73   Ht 5' 3.5" (1.613 m)   Wt 173 lb 1.6 oz (78.5 kg)   LMP 07/24/2007 Comment: ablation in 2008-pt reports she went through menopause after this  BMI 30.18 kg/m  BP Readings from Last 3 Encounters:  03/26/23 123/73  02/16/23 126/71  02/12/23 116/64   Wt Readings from Last 3 Encounters:  03/26/23 173 lb 1.6 oz (78.5 kg)  02/16/23 172 lb (78 kg)  02/12/23 174 lb (78.9 kg)    GENERAL: alert, oriented, appears well and in no acute distress  HEENT: atraumatic, conjunttiva clear, no obvious abnormalities on inspection of  external nose and ears  NECK: normal movements of the head and neck  LUNGS: on inspection no signs of respiratory distress, breathing rate appears normal, no obvious gross SOB, gasping or wheezing  CV: no obvious cyanosis  MS: moves all visible extremities without noticeable abnormality  PSYCH/NEURO: pleasant and cooperative, no obvious depression or anxiety, speech and thought processing grossly intact  ASSESSMENT AND PLAN: Other microscopic hematuria Assessment & Plan: Asymptomatic . microscopic 6-10 RBC seen 2 weeks ago.  Patient had CT urogram 02/19/2023 as ordered by urology.  She is compliant with estrogen 3 days/week.  Advised to continue this therapy until urology follow-up in October.   Left hip pain Assessment & Plan: Exam limited as this was a virtual visit.  Patient has seen [facility].  Requesting office visit notes.  Advised her that tramadol if taken separately from Flexeril would be appropriate.  Encouraged water aerobics, home exercises.  She will let me know how she is doing.       -we discussed possible serious and likely etiologies, options for evaluation and workup, limitations of telemedicine visit vs in person visit, treatment, treatment risks and precautions. Pt prefers to treat via telemedicine empirically rather then risking or undertaking an in person visit at this moment.    I discussed the assessment and treatment plan with the patient. The patient was provided an opportunity to ask questions and all were answered. The patient agreed with the plan and demonstrated an understanding of the instructions.   The patient was advised to  call back or seek an in-person evaluation if the symptoms worsen or if the condition fails to improve as anticipated.  Advised if desired AVS can be mailed or viewed via MyChart if Mychart user.   Rennie Plowman, FNP

## 2023-03-26 NOTE — Assessment & Plan Note (Addendum)
Asymptomatic . microscopic 6-10 RBC seen 2 weeks ago.  Patient had CT urogram 02/19/2023 as ordered by urology.  She is compliant with estrogen 3 days/week.  Advised to continue this therapy until urology follow-up in October.

## 2023-03-26 NOTE — Telephone Encounter (Signed)
Sorry Disregard note, I see CT urogram 02/19/23

## 2023-03-26 NOTE — Telephone Encounter (Signed)
Dr Richardo Hanks   I had a visit with patient today and per your  note last month, CT urogram was discussed.  She is compliant with estrogen cream.   Unless I misunderstood your note, was this supposed to be arranged prior to follow up in October?  Recent urinalysis 2 weeks ago shows RBCs 6-10

## 2023-03-26 NOTE — Assessment & Plan Note (Signed)
Exam limited as this was a virtual visit.  Patient has seen [facility].  Requesting office visit notes.  Advised her that tramadol if taken separately from Flexeril would be appropriate.  Encouraged water aerobics, home exercises.  She will let me know how she is doing.

## 2023-03-29 ENCOUNTER — Telehealth: Payer: Self-pay

## 2023-03-29 NOTE — Telephone Encounter (Signed)
Spoke to Prosperity @ Dr Yevette Edwards office and she stated that she would fax over the OV notes to me. Fax number was given

## 2023-04-05 ENCOUNTER — Other Ambulatory Visit: Payer: Self-pay

## 2023-04-05 ENCOUNTER — Telehealth: Payer: Commercial Managed Care - PPO | Admitting: Physician Assistant

## 2023-04-05 DIAGNOSIS — B3731 Acute candidiasis of vulva and vagina: Secondary | ICD-10-CM | POA: Diagnosis not present

## 2023-04-05 MED ORDER — FLUCONAZOLE 150 MG PO TABS
150.0000 mg | ORAL_TABLET | ORAL | 0 refills | Status: DC | PRN
Start: 2023-04-05 — End: 2023-09-17
  Filled 2023-04-05: qty 2, 6d supply, fill #0

## 2023-04-05 NOTE — Progress Notes (Signed)

## 2023-04-13 DIAGNOSIS — E7800 Pure hypercholesterolemia, unspecified: Secondary | ICD-10-CM | POA: Diagnosis not present

## 2023-04-13 DIAGNOSIS — E1129 Type 2 diabetes mellitus with other diabetic kidney complication: Secondary | ICD-10-CM | POA: Diagnosis not present

## 2023-04-13 DIAGNOSIS — R809 Proteinuria, unspecified: Secondary | ICD-10-CM | POA: Diagnosis not present

## 2023-04-14 ENCOUNTER — Ambulatory Visit: Payer: Commercial Managed Care - PPO | Admitting: Podiatry

## 2023-04-16 ENCOUNTER — Ambulatory Visit
Admission: RE | Admit: 2023-04-16 | Discharge: 2023-04-16 | Disposition: A | Discharge status: Home / Self Care | Payer: Commercial Managed Care - PPO | Source: Ambulatory Visit | Attending: Family | Admitting: Family

## 2023-04-16 DIAGNOSIS — I700 Atherosclerosis of aorta: Secondary | ICD-10-CM

## 2023-04-21 ENCOUNTER — Other Ambulatory Visit: Payer: Self-pay | Admitting: Family

## 2023-04-21 ENCOUNTER — Other Ambulatory Visit: Payer: Self-pay

## 2023-04-21 DIAGNOSIS — Z1231 Encounter for screening mammogram for malignant neoplasm of breast: Secondary | ICD-10-CM

## 2023-04-23 ENCOUNTER — Encounter: Payer: Self-pay | Admitting: Family

## 2023-04-28 ENCOUNTER — Ambulatory Visit: Payer: Commercial Managed Care - PPO | Admitting: Podiatry

## 2023-04-28 ENCOUNTER — Other Ambulatory Visit: Payer: Self-pay

## 2023-04-28 ENCOUNTER — Encounter: Payer: Self-pay | Admitting: Podiatry

## 2023-04-28 DIAGNOSIS — L603 Nail dystrophy: Secondary | ICD-10-CM

## 2023-04-28 MED ORDER — TERBINAFINE HCL 250 MG PO TABS
250.0000 mg | ORAL_TABLET | Freq: Every day | ORAL | 0 refills | Status: DC
  Filled 2023-04-28: qty 30, 30d supply, fill #0

## 2023-04-28 NOTE — Progress Notes (Signed)
She presents today for follow-up of her nail pathology.  She states that the Penlac that we provided her from a phone call where she was requesting something in the interim to utilize for the nail fungus until she can get back in the office I had started to bother her toe with burning and redness.  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  Assessment: Pain in limb secondary to onychomycosis.  Plan: Started her on Lamisil therapy discussed the pros and cons of the medication both side effects were discussed as well follow-up with her in 1 month for blood work.

## 2023-04-29 ENCOUNTER — Encounter: Payer: Self-pay | Admitting: Family

## 2023-04-30 ENCOUNTER — Other Ambulatory Visit: Payer: Self-pay

## 2023-04-30 ENCOUNTER — Other Ambulatory Visit: Payer: Self-pay | Admitting: Family

## 2023-04-30 DIAGNOSIS — E785 Hyperlipidemia, unspecified: Secondary | ICD-10-CM

## 2023-04-30 DIAGNOSIS — I700 Atherosclerosis of aorta: Secondary | ICD-10-CM

## 2023-04-30 MED ORDER — ROSUVASTATIN CALCIUM 40 MG PO TABS
40.0000 mg | ORAL_TABLET | Freq: Every day | ORAL | 3 refills | Status: DC
Start: 2023-04-30 — End: 2024-05-02
  Filled 2023-04-30: qty 90, 90d supply, fill #0
  Filled 2023-07-21: qty 90, 90d supply, fill #1
  Filled 2023-10-28: qty 90, 90d supply, fill #2
  Filled 2024-02-01: qty 90, 90d supply, fill #3

## 2023-05-06 ENCOUNTER — Other Ambulatory Visit: Payer: Self-pay | Admitting: Family

## 2023-05-06 DIAGNOSIS — E119 Type 2 diabetes mellitus without complications: Secondary | ICD-10-CM

## 2023-05-08 ENCOUNTER — Other Ambulatory Visit: Payer: Self-pay

## 2023-05-12 DIAGNOSIS — E1129 Type 2 diabetes mellitus with other diabetic kidney complication: Secondary | ICD-10-CM | POA: Diagnosis not present

## 2023-05-12 DIAGNOSIS — E785 Hyperlipidemia, unspecified: Secondary | ICD-10-CM | POA: Diagnosis not present

## 2023-05-12 DIAGNOSIS — I10 Essential (primary) hypertension: Secondary | ICD-10-CM | POA: Diagnosis not present

## 2023-05-12 DIAGNOSIS — I639 Cerebral infarction, unspecified: Secondary | ICD-10-CM | POA: Diagnosis not present

## 2023-05-12 DIAGNOSIS — R809 Proteinuria, unspecified: Secondary | ICD-10-CM | POA: Diagnosis not present

## 2023-05-12 DIAGNOSIS — E119 Type 2 diabetes mellitus without complications: Secondary | ICD-10-CM | POA: Diagnosis not present

## 2023-05-12 DIAGNOSIS — G459 Transient cerebral ischemic attack, unspecified: Secondary | ICD-10-CM | POA: Diagnosis not present

## 2023-05-12 DIAGNOSIS — R42 Dizziness and giddiness: Secondary | ICD-10-CM | POA: Diagnosis not present

## 2023-05-18 ENCOUNTER — Other Ambulatory Visit: Payer: Self-pay | Admitting: Family

## 2023-05-18 ENCOUNTER — Other Ambulatory Visit: Payer: Self-pay

## 2023-05-18 DIAGNOSIS — B3731 Acute candidiasis of vulva and vagina: Secondary | ICD-10-CM

## 2023-05-18 NOTE — Telephone Encounter (Signed)
Okay to refill?  LOV: 03/26/2023

## 2023-05-19 ENCOUNTER — Ambulatory Visit: Payer: Commercial Managed Care - PPO | Admitting: Urology

## 2023-05-19 ENCOUNTER — Encounter: Payer: Self-pay | Admitting: Family

## 2023-05-20 ENCOUNTER — Other Ambulatory Visit: Payer: Self-pay

## 2023-05-20 NOTE — Telephone Encounter (Signed)
LVM to call back to inform her that she needs an appt in person She is requesting refill of Diflucan. I do not normally refill this.  If she having symptoms of a yeast infection?   I would advise testing to confirm yeast to ensure we are treating yeast and  not vaginal atrophy or BV

## 2023-05-21 ENCOUNTER — Encounter: Payer: Self-pay | Admitting: Nurse Practitioner

## 2023-05-21 ENCOUNTER — Other Ambulatory Visit (HOSPITAL_COMMUNITY)
Admission: RE | Admit: 2023-05-21 | Discharge: 2023-05-21 | Disposition: A | Discharge status: Home / Self Care | Payer: Commercial Managed Care - PPO | Source: Ambulatory Visit | Attending: Nurse Practitioner | Admitting: Nurse Practitioner

## 2023-05-21 ENCOUNTER — Encounter: Payer: Self-pay | Admitting: Family

## 2023-05-21 ENCOUNTER — Telehealth: Payer: Self-pay | Admitting: Family

## 2023-05-21 ENCOUNTER — Ambulatory Visit: Payer: Commercial Managed Care - PPO | Admitting: Nurse Practitioner

## 2023-05-21 VITALS — BP 110/70 | HR 86 | Temp 97.4°F | Ht 63.5 in | Wt 177.2 lb

## 2023-05-21 DIAGNOSIS — N898 Other specified noninflammatory disorders of vagina: Secondary | ICD-10-CM | POA: Diagnosis not present

## 2023-05-21 DIAGNOSIS — R3915 Urgency of urination: Secondary | ICD-10-CM | POA: Diagnosis not present

## 2023-05-21 LAB — POC URINALSYSI DIPSTICK (AUTOMATED)
Bilirubin, UA: NEGATIVE
Glucose, UA: NEGATIVE
Ketones, UA: NEGATIVE
Leukocytes, UA: NEGATIVE
Nitrite, UA: NEGATIVE
Protein, UA: POSITIVE — AB
Spec Grav, UA: 1.02 (ref 1.010–1.025)
Urobilinogen, UA: 1 U/dL
pH, UA: 7 (ref 5.0–8.0)

## 2023-05-21 NOTE — Progress Notes (Unsigned)
Established Patient Office Visit  Subjective:  Patient ID: Alyssa Schultz, female    DOB: 1966-07-25  Age: 57 y.o. MRN: [id number]  CC:  Chief Complaint  Patient presents with   Urinary Tract Infection    HPI  Alyssa Schultz presents for some itching and urinary frequency that started 2 dayss ago.  No abdominal pain, u  HPI   Past Medical History:  Diagnosis Date   Complication of anesthesia    hard to wake in 2017, patient reports she is 30 lb lighter now   Diabetes mellitus    dx 2014   just take pills   Hyperlipidemia    Hypertension    Neuromuscular disorder [facility])     Past Surgical History:  Procedure Laterality Date   abalsion     ablation     ANTERIOR CERVICAL DECOMP/DISCECTOMY FUSION N/A 09/19/2015   Procedure: ANTERIOR CERVICAL DECOMPRESSION/DISCECTOMY FUSION 2 LEVELS;  Surgeon: Estill Bamberg, MD;  Location: [facility] OR;  Service: Orthopedics;  Laterality: N/A;  Anterior cervical decompression fusion, cervical 3-4, cervical 4-5 decompression with instrumentation and allograft   BACK SURGERY     COLONOSCOPY WITH PROPOFOL N/A 07/18/2021   Procedure: COLONOSCOPY WITH PROPOFOL;  Surgeon: Pasty Spillers, MD;  Location: [facility] ENDOSCOPY;  Service: Endoscopy;  Laterality: N/A;   COLONOSCOPY WITH PROPOFOL N/A 01/26/2022   Procedure: COLONOSCOPY WITH PROPOFOL;  Surgeon: Toney Reil, MD;  Location: [facility] ENDOSCOPY;  Service: Gastroenterology;  Laterality: N/A;   FACIAL RECONSTRUCTION SURGERY     TRANSFORAMINAL LUMBAR INTERBODY FUSION (TLIF) WITH PEDICLE SCREW FIXATION 2 LEVEL Right 07/26/2019   Procedure: RIGHT-SIDED LUMBAR 5 - SACRUM 1 TRANSFORAMINAL LUMBAR INTERBODY FUSION WITH INSTRUMENTATION AND ALLOGRAFT;  Surgeon: Estill Bamberg, MD;  Location: [facility] OR;  Service: Orthopedics;  Laterality: Right;    Family History  Problem Relation Age of Onset   Diabetes Mother    Diabetes Maternal Grandmother    Hypertension Maternal Grandfather    Breast cancer Neg Hx     Thyroid cancer Neg Hx     Social History   Socioeconomic History   Marital status: Divorced    Spouse name: Not on file   Number of children: Not on file   Years of education: Not on file   Highest education level: Some college, no degree  Occupational History   Not on file  Tobacco Use   Smoking status: Never   Smokeless tobacco: Never  Vaping Use   Vaping status: Never Used  Substance and Sexual Activity   Alcohol use: Yes    Comment: Occasional wine   Drug use: No   Sexual activity: Yes  Other Topics Concern   Not on file  Social History Narrative   Divorced, no children      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      She prior auth for GI such as EGD, colonoscopy            Social Determinants of Health   Financial Resource Strain: Medium Risk (01/20/2023)   Overall Financial Resource Strain (CARDIA)    Difficulty of Paying Living Expenses: Somewhat hard  Food Insecurity: Food Insecurity Present (01/20/2023)   Hunger Vital Sign    Worried About Running Out of Food in the Last Year: Sometimes true    Ran Out of Food in the Last Year: Sometimes true  Transportation Needs: No Transportation Needs (01/20/2023)   PRAPARE - Administrator, Civil Service (Medical): No  Lack of Transportation (Non-Medical): No  Physical Activity: Sufficiently Active (01/20/2023)   Exercise Vital Sign    Days of Exercise per Week: 6 days    Minutes of Exercise per Session: 60 min  Stress: Stress Concern Present (01/20/2023)   [employer] of Occupational Health - Occupational Stress Questionnaire    Feeling of Stress : To some extent  Social Connections: Moderately Integrated (01/20/2023)   Social Connection and Isolation Panel [NHANES]    Frequency of Communication with Friends and Family: More than three times a week    Frequency of Social Gatherings with Friends and Family: Never    Attends Religious Services: 1 to 4 times per year    Active Member of [employer] or  Organizations: Yes    Attends Banker Meetings: 1 to 4 times per year    Marital Status: Divorced  Catering manager Violence: Not on file     Outpatient Medications Prior to Visit  Medication Sig Dispense Refill   aspirin EC 81 MG EC tablet Take 1 tablet (81 mg total) by mouth daily. 30 tablet 0   Blood Glucose Monitoring Suppl (FREESTYLE FREEDOM LITE) w/Device KIT      Blood Glucose Monitoring Suppl (FREESTYLE FREEDOM LITE) w/Device KIT Use as directed 1 kit 0   ciclopirox (PENLAC) 8 % solution Apply 1 Application topically at bedtime. Apply to nail/surrounding skin and daily over previous coat. Every 7 days remove with alcohol and continue. 6.6 mL 11   Continuous Blood Gluc Sensor (FREESTYLE LIBRE 3 SENSOR) MISC Place 1 sensor on the skin every 14 days. Use to check glucose continuously 2 each 3   cyclobenzaprine (FLEXERIL) 5 MG tablet Take 1-2 tablets (5-10 mg total) by mouth at bedtime as needed. 60 tablet 1   estradiol (ESTRACE) 0.1 MG/GM vaginal cream Estrogen Cream Instruction  Discard applicator  Apply pea sized amount to tip of finger to urethra before bed. Wash hands well after application. Use Monday, Wednesday and Friday 42.5 g 12   glucose blood (FREESTYLE LITE) test strip Used to check blood sugars once a day. 100 each 4   linaclotide (LINZESS) 72 MCG capsule Take 1 capsule (72 mcg total) by mouth daily before breakfast. 90 capsule 0   losartan (COZAAR) 25 MG tablet Take 1 tablet (25 mg total) by mouth once a day 30 tablet 11   meclizine (ANTIVERT) 25 MG tablet Take 2 tablets (50 mg total) by mouth 3 (three) times daily as needed for dizziness (Patient taking differently: Take 25 mg by mouth as needed for dizziness.) 60 tablet 1   metFORMIN (GLUCOPHAGE-XR) 500 MG 24 hr tablet Take 1 tablet (500 mg total) by mouth every evening. 90 tablet 3   nystatin cream (MYCOSTATIN) Apply 1 Application topically 2 (two) times daily. 30 g 1   ondansetron (ZOFRAN-ODT) 4 MG  disintegrating tablet Take 1 tablet (4 mg total) by mouth every 8 (eight) hours as needed for nausea or vomiting. 30 tablet 0   rosuvastatin (CRESTOR) 40 MG tablet Take 1 tablet (40 mg total) by mouth daily. 90 tablet 3   traMADol (ULTRAM) 50 MG tablet Take 1 tablet (50 mg total) by mouth 3 (three) times daily as needed for pain. 60 tablet 0   trimethoprim-polymyxin b (POLYTRIM) ophthalmic solution Place 1 drop into the left eye every 4 (four) hours. 10 mL 0   TRUEPLUS LANCETS 30G MISC USE AS DIRECTED 100 each 5   valACYclovir (VALTREX) 500 MG tablet Take 2 tablets (1,000 mg  total) by mouth 2 (two) times daily. Take prn cold sore on on day 12 tablet 1   fluconazole (DIFLUCAN) 150 MG tablet Take 1 tablet (150 mg total) by mouth every 3 (three) days as needed. (Patient not taking: Reported on 05/21/2023) 2 tablet 0   terbinafine (LAMISIL) 250 MG tablet Take 1 tablet (250 mg total) by mouth daily. (Patient not taking: Reported on 05/21/2023) 30 tablet 0   No facility-administered medications prior to visit.    Allergies  Allergen Reactions   Haemophilus B Polysaccharide Vaccine     Per Dr - Pt is to not receive vaccine    Influenza Vaccine Live     Per Dr - Pt is to not receive vaccine    Influenza Vaccines Other (See Comments)    Per Dr - Pt is to not receive vaccine    Semaglutide     Other Reaction(s): Other (See Comments)  Per mbr throat swelling   Hydrocodone Rash   Morphine And Codeine Other (See Comments)    Makes her crazy   Prednisone     "makes me crazy"    ROS Review of Systems Negative unless indicated in HPI.    Objective:    Physical Exam  BP 110/70   Pulse 86   Temp (!) 97.4 F (36.3 C) (Oral)   Ht 5' 3.5" (1.613 m)   Wt 177 lb 3.2 oz (80.4 kg)   LMP 07/24/2007 Comment: ablation in 2008-pt reports she went through menopause after this  SpO2 97%   BMI 30.90 kg/m  Wt Readings from Last 3 Encounters:  05/21/23 177 lb 3.2 oz (80.4 kg)  03/26/23 173 lb 1.6 oz  (78.5 kg)  02/16/23 172 lb (78 kg)     Health Maintenance  Topic Date Due   Zoster Vaccines- Shingrix (1 of 2) Never done   Cervical Cancer Screening (HPV/Pap Cotest)  03/05/2019   OPHTHALMOLOGY EXAM  12/09/2022   HEMOGLOBIN A1C  08/03/2023   Diabetic kidney evaluation - eGFR measurement  08/14/2023   Diabetic kidney evaluation - Urine ACR  02/12/2024   MAMMOGRAM  02/25/2024   Colonoscopy  01/27/2027   DTaP/Tdap/Td (2 - Td or Tdap) 12/11/2027   HPV VACCINES  Aged Out   INFLUENZA VACCINE  Discontinued   FOOT EXAM  Discontinued   COVID-19 Vaccine  Discontinued   Hepatitis C Screening  Discontinued   HIV Screening  Discontinued    There are no preventive care reminders to display for this patient.  Lab Results  Component Value Date   TSH 1.33 03/21/2021   Lab Results  Component Value Date   WBC 6.7 02/18/2021   HGB 14.2 02/18/2021   HCT 42.1 02/18/2021   MCV 83.9 02/18/2021   PLT 236 02/18/2021   Lab Results  Component Value Date   NA 139 08/13/2022   K 3.7 08/13/2022   CO2 24 08/13/2022   GLUCOSE 126 (H) 08/13/2022   BUN 17 08/13/2022   CREATININE 0.70 08/13/2022   BILITOT 0.5 08/13/2022   ALKPHOS 61 08/13/2022   AST 22 08/13/2022   ALT 19 08/13/2022   PROT 7.9 08/13/2022   ALBUMIN 4.3 08/13/2022   CALCIUM 9.0 08/13/2022   ANIONGAP 8 08/13/2022   GFR 98.10 12/08/2021   Lab Results  Component Value Date   CHOL 178 02/01/2023   Lab Results  Component Value Date   HDL 79 02/01/2023   Lab Results  Component Value Date   LDLCALC 82 02/01/2023   Lab  Results  Component Value Date   TRIG 83 02/01/2023   Lab Results  Component Value Date   CHOLHDL 2.3 02/01/2023   Lab Results  Component Value Date   HGBA1C 6.9 (H) 02/01/2023      Assessment & Plan:  Yeast vaginitis  Vaginal itching    Follow-up: No follow-ups on file.   Kara Dies, NP

## 2023-05-21 NOTE — Telephone Encounter (Signed)
Spoke to pt and scheduled her to see Alyssa Schultz today in office

## 2023-05-21 NOTE — Telephone Encounter (Signed)
Patient returned Jenate's phone call.

## 2023-05-21 NOTE — Telephone Encounter (Signed)
Scheduled appt with Alyssa Schultz on 05/21/23

## 2023-05-22 LAB — URINALYSIS, ROUTINE W REFLEX MICROSCOPIC
Bacteria, UA: NONE SEEN /[HPF]
Bilirubin Urine: NEGATIVE
Glucose, UA: NEGATIVE
Hgb urine dipstick: NEGATIVE
Hyaline Cast: NONE SEEN /[LPF]
Ketones, ur: NEGATIVE
Leukocytes,Ua: NEGATIVE
Nitrite: NEGATIVE
RBC / HPF: NONE SEEN /[HPF] (ref 0–2)
Specific Gravity, Urine: 1.012 (ref 1.001–1.035)
Squamous Epithelial / HPF: NONE SEEN /[HPF] (ref <=5)
WBC, UA: NONE SEEN /[HPF] (ref 0–5)
pH: 7 (ref 5.0–8.0)

## 2023-05-22 LAB — URINE CULTURE
MICRO NUMBER:: 15553980
Result:: NO GROWTH
SPECIMEN QUALITY:: ADEQUATE

## 2023-05-22 LAB — MICROSCOPIC MESSAGE

## 2023-05-25 LAB — CERVICOVAGINAL ANCILLARY ONLY
Bacterial Vaginitis (gardnerella): POSITIVE — AB
Candida Glabrata: NEGATIVE
Candida Vaginitis: NEGATIVE
Chlamydia: NEGATIVE
Comment: NEGATIVE
Comment: NEGATIVE
Comment: NEGATIVE
Comment: NEGATIVE
Comment: NEGATIVE
Comment: NORMAL
Neisseria Gonorrhea: NEGATIVE
Trichomonas: NEGATIVE

## 2023-05-26 ENCOUNTER — Ambulatory Visit: Payer: Commercial Managed Care - PPO | Admitting: Podiatry

## 2023-05-26 ENCOUNTER — Other Ambulatory Visit: Payer: Self-pay

## 2023-05-26 ENCOUNTER — Encounter: Payer: Self-pay | Admitting: Nurse Practitioner

## 2023-05-26 DIAGNOSIS — R3915 Urgency of urination: Secondary | ICD-10-CM | POA: Insufficient documentation

## 2023-05-26 MED ORDER — FLUCONAZOLE 150 MG PO TABS
150.0000 mg | ORAL_TABLET | Freq: Every day | ORAL | 0 refills | Status: DC
  Filled 2023-05-26: qty 2, 2d supply, fill #0

## 2023-05-26 MED ORDER — METRONIDAZOLE 500 MG PO TABS
500.0000 mg | ORAL_TABLET | Freq: Two times a day (BID) | ORAL | 0 refills | Status: AC
  Filled 2023-05-26: qty 14, 7d supply, fill #0

## 2023-05-26 NOTE — Assessment & Plan Note (Signed)
POCT urinalysis negative for leukocytes, nitrites and hematuria. Culture pending.

## 2023-05-26 NOTE — Assessment & Plan Note (Addendum)
Patient prefers self swab. Declined pelvic exam.

## 2023-06-02 ENCOUNTER — Ambulatory Visit
Admission: RE | Admit: 2023-06-02 | Discharge: 2023-06-02 | Disposition: A | Discharge status: Home / Self Care | Payer: Commercial Managed Care - PPO | Source: Ambulatory Visit | Attending: Family | Admitting: Family

## 2023-06-02 DIAGNOSIS — Z1231 Encounter for screening mammogram for malignant neoplasm of breast: Secondary | ICD-10-CM | POA: Diagnosis not present

## 2023-06-11 ENCOUNTER — Other Ambulatory Visit
Admission: RE | Admit: 2023-06-11 | Discharge: 2023-06-11 | Disposition: A | Discharge status: Home / Self Care | Payer: Commercial Managed Care - PPO | Attending: Family | Admitting: Family

## 2023-06-11 DIAGNOSIS — E785 Hyperlipidemia, unspecified: Secondary | ICD-10-CM

## 2023-06-11 DIAGNOSIS — E119 Type 2 diabetes mellitus without complications: Secondary | ICD-10-CM | POA: Diagnosis not present

## 2023-06-11 DIAGNOSIS — I700 Atherosclerosis of aorta: Secondary | ICD-10-CM

## 2023-06-11 LAB — LIPID PANEL
Cholesterol: 146 mg/dL (ref 0–200)
HDL: 65 mg/dL (ref >40)
LDL Cholesterol: 67 mg/dL (ref 0–99)
Total CHOL/HDL Ratio: 2.2 {ratio}
Triglycerides: 68 mg/dL (ref <150)
VLDL: 14 mg/dL (ref 0–40)

## 2023-06-11 LAB — HEPATIC FUNCTION PANEL
ALT: 19 U/L (ref 0–44)
AST: 22 U/L (ref 15–41)
Albumin: 4.1 g/dL (ref 3.5–5.0)
Alkaline Phosphatase: 59 U/L (ref 38–126)
Bilirubin, Direct: <0.1 mg/dL (ref 0.0–0.2)
Total Bilirubin: 0.2 mg/dL — ABNORMAL LOW (ref 0.3–1.2)
Total Protein: 7.7 g/dL (ref 6.5–8.1)

## 2023-06-11 LAB — HEMOGLOBIN A1C
Hgb A1c MFr Bld: 7.7 % — ABNORMAL HIGH (ref 4.8–5.6)
Mean Plasma Glucose: 174.29 mg/dL

## 2023-06-11 NOTE — Addendum Note (Signed)
Addended by: Caroline Sauger on: 06/11/2023 08:29 AM   Modules accepted: Orders

## 2023-06-12 ENCOUNTER — Encounter: Payer: Self-pay | Admitting: Family

## 2023-06-14 ENCOUNTER — Encounter: Payer: Self-pay | Admitting: *Deleted

## 2023-06-15 ENCOUNTER — Other Ambulatory Visit: Payer: Self-pay | Admitting: Family

## 2023-06-15 DIAGNOSIS — E119 Type 2 diabetes mellitus without complications: Secondary | ICD-10-CM

## 2023-06-15 MED ORDER — METFORMIN HCL ER 500 MG PO TB24: 500.0000 mg | ORAL_TABLET | Freq: Two times a day (BID) | ORAL | Status: DC

## 2023-06-22 ENCOUNTER — Encounter: Payer: Self-pay | Admitting: Family

## 2023-06-22 ENCOUNTER — Telehealth: Payer: Commercial Managed Care - PPO | Admitting: Family

## 2023-06-22 ENCOUNTER — Telehealth: Payer: Self-pay

## 2023-06-22 ENCOUNTER — Ambulatory Visit
Admission: RE | Admit: 2023-06-22 | Discharge: 2023-06-22 | Disposition: A | Discharge status: Home / Self Care | Payer: Commercial Managed Care - PPO | Source: Ambulatory Visit | Attending: Family

## 2023-06-22 ENCOUNTER — Other Ambulatory Visit: Payer: Self-pay

## 2023-06-22 ENCOUNTER — Ambulatory Visit
Admission: RE | Admit: 2023-06-22 | Discharge: 2023-06-22 | Disposition: A | Discharge status: Home / Self Care | Payer: Commercial Managed Care - PPO | Attending: Family | Admitting: Family

## 2023-06-22 ENCOUNTER — Other Ambulatory Visit: Payer: Self-pay | Admitting: Family

## 2023-06-22 VITALS — BP 117/72 | Ht 67.0 in | Wt 169.0 lb

## 2023-06-22 DIAGNOSIS — M79605 Pain in left leg: Secondary | ICD-10-CM | POA: Insufficient documentation

## 2023-06-22 DIAGNOSIS — M25562 Pain in left knee: Secondary | ICD-10-CM | POA: Diagnosis not present

## 2023-06-22 DIAGNOSIS — E119 Type 2 diabetes mellitus without complications: Secondary | ICD-10-CM

## 2023-06-22 DIAGNOSIS — M1712 Unilateral primary osteoarthritis, left knee: Secondary | ICD-10-CM | POA: Diagnosis not present

## 2023-06-22 MED ORDER — GABAPENTIN 100 MG PO CAPS
100.0000 mg | ORAL_CAPSULE | Freq: Two times a day (BID) | ORAL | 1 refills | Status: AC
  Filled 2023-06-22: qty 180, 90d supply, fill #0

## 2023-06-22 NOTE — Progress Notes (Signed)
Virtual Visit via Video Note  I connected with Alyssa Schultz on 06/22/23 at 11:00 AM EST by a video enabled telemedicine application and verified that I am speaking with the correct person using two identifiers. Location patient: home Location provider: work  Persons participating in the virtual visit: patient, provider  I discussed the limitations of evaluation and management by telemedicine and the availability of in person appointments. The patient expressed understanding and agreed to proceed.  HPI: Complains of left anterior shin x 3 -4 weeks Burning from below left knee to left ankle. Describes as painful.  She reports a history of a stress fracture  She stands at work without pain. It is most painful at night. No numbness or burning in her left foot.  No leg swelling, calf swelling,saddle anesthesia, urinary or fecal incontinence.  No falls, trauma.  No lacerations.  Recently seen by [facility] orthopedics 03/2023 and she was provided the celebrex and tramadol. Left hip pain has resolved.   She never started celebrex  She reports 'nerve study' in 2020 with Dr Yevette Edwards.   She has h/o cervical decompression, lumbar fusion, Dr Yevette Edwards.    She has been taking tramadol 12.5 mg qam and 12.5mg  in the afternoon. She has taken gabapentin in the past.  She is taking tylenol 500mg  qam.  11/07/2021 left tib/fib,  remote trauma, no acute fracture  ROS: See pertinent positives and negatives per HPI.  EXAM:  VITALS per patient if applicable: BP 117/72   Ht 5\' 7"  (1.702 m)   Wt 169 lb (76.7 kg)   LMP 07/24/2007 Comment: ablation in 2008-pt reports she went through menopause after this  BMI 26.47 kg/m  BP Readings from Last 3 Encounters:  06/22/23 117/72  05/21/23 110/70  03/26/23 123/73   Wt Readings from Last 3 Encounters:  06/22/23 169 lb (76.7 kg)  05/21/23 177 lb 3.2 oz (80.4 kg)  03/26/23 173 lb 1.6 oz (78.5 kg)    GENERAL: alert, oriented, appears well and in no acute  distress  HEENT: atraumatic, conjunttiva clear, no obvious abnormalities on inspection of external nose and ears  NECK: normal movements of the head and neck  LUNGS: on inspection no signs of respiratory distress, breathing rate appears normal, no obvious gross SOB, gasping or wheezing  CV: no obvious cyanosis  MS: moves all visible extremities without noticeable abnormality  PSYCH/NEURO: pleasant and cooperative, no obvious depression or anxiety, speech and thought processing grossly intact  ASSESSMENT AND PLAN: Pain of left lower extremity Assessment & Plan: Discussed whether pain is from history of stress fracture or more neuropathic in nature.  She does have a history of lumbar fusion.  Requesting office visit notes from [facility]opedics August 2024.  Pending baseline tibia-fibula x-ray.  Fortunately no neurologic alarm features at this time.  Discussed trial of gabapentin 100 mg twice daily, scheduling Tylenol arthritis 650 mg twice daily.  Advised against use of NSAIDs due to history of CVA.  She may continue use of tramadol 12.5 mg twice daily as prescribed by [facility].  She will let me know how she is doing.  She politely declines EMG study at this time.  Consider mri lumbar. Will await records from [facility]  Orders: -     Gabapentin; Take 1 capsule (100 mg total) by mouth 2 (two) times daily.  Dispense: 180 capsule; Refill: 1 -     DG Tibia/Fibula Left; Future     -we discussed possible serious and likely etiologies, options for  evaluation and workup, limitations of telemedicine visit vs in person visit, treatment, treatment risks and precautions. Pt prefers to treat via telemedicine empirically rather then risking or undertaking an in person visit at this moment.    I discussed the assessment and treatment plan with the patient. The patient was provided an opportunity to ask questions and all were answered. The patient agreed with the plan and  demonstrated an understanding of the instructions.   The patient was advised to call back or seek an in-person evaluation if the symptoms worsen or if the condition fails to improve as anticipated.  Advised if desired AVS can be mailed or viewed via MyChart if Mychart user.   Rennie Plowman, FNP

## 2023-06-22 NOTE — Assessment & Plan Note (Signed)
Discussed whether pain is from history of stress fracture or more neuropathic in nature.  She does have a history of lumbar fusion.  Requesting office visit notes from [facility] orthopedics August 2024.  Pending baseline tibia-fibula x-ray.  Fortunately no neurologic alarm features at this time.  Discussed trial of gabapentin 100 mg twice daily, scheduling Tylenol arthritis 650 mg twice daily.  Advised against use of NSAIDs due to history of CVA.  She may continue use of tramadol 12.5 mg twice daily as prescribed by [facility].  She will let me know how she is doing.  She politely declines EMG study at this time.  Consider mri lumbar. Will await records from [facility]oped[facility]

## 2023-06-22 NOTE — Telephone Encounter (Signed)
Scheduled VV for 06/22/23

## 2023-06-22 NOTE — Telephone Encounter (Signed)
LVM for Larita Fife in Medical records to call back to request ov notes from 03/2023. Fax # and office # given

## 2023-06-22 NOTE — Patient Instructions (Signed)
Schedule tylenol arthritis 650mg  twice daily  Gabapentin 100mg  in the morning and at night.   I would avoid NSAIDs ( celebrex, mobic) due to thrombotic risk.   Please let me know how you are doing.

## 2023-06-23 ENCOUNTER — Other Ambulatory Visit: Payer: Self-pay | Admitting: Family

## 2023-06-23 ENCOUNTER — Other Ambulatory Visit: Payer: Self-pay

## 2023-06-23 ENCOUNTER — Telehealth: Payer: Self-pay

## 2023-06-23 ENCOUNTER — Encounter: Payer: Self-pay | Admitting: Family

## 2023-06-23 DIAGNOSIS — E119 Type 2 diabetes mellitus without complications: Secondary | ICD-10-CM

## 2023-06-23 NOTE — Telephone Encounter (Signed)
Called [facility]. As pt stated she got an eye exam in may or June and I got their fax number [phone] and a req of records have been sent

## 2023-06-24 ENCOUNTER — Other Ambulatory Visit: Payer: Self-pay

## 2023-06-30 ENCOUNTER — Other Ambulatory Visit: Payer: Self-pay

## 2023-06-30 MED ORDER — FREESTYLE LITE TEST VI STRP
1.0000 | ORAL_STRIP | Freq: Two times a day (BID) | 4 refills | Status: AC
  Filled 2023-06-30: qty 100, 50d supply, fill #0
  Filled 2023-08-23: qty 100, 50d supply, fill #1
  Filled 2023-11-02: qty 100, 50d supply, fill #2
  Filled 2024-01-04: qty 100, 50d supply, fill #3
  Filled 2024-04-07: qty 100, 50d supply, fill #4

## 2023-07-21 ENCOUNTER — Other Ambulatory Visit: Payer: Self-pay

## 2023-07-21 ENCOUNTER — Ambulatory Visit: Payer: Commercial Managed Care - PPO | Admitting: Urology

## 2023-07-22 ENCOUNTER — Telehealth: Payer: Self-pay

## 2023-07-22 NOTE — Telephone Encounter (Signed)
OV notes are being faxed from Orthopedics per Receptionist

## 2023-07-23 ENCOUNTER — Other Ambulatory Visit: Payer: Self-pay

## 2023-07-26 ENCOUNTER — Other Ambulatory Visit: Payer: Self-pay

## 2023-07-27 ENCOUNTER — Other Ambulatory Visit: Payer: Self-pay

## 2023-07-28 ENCOUNTER — Other Ambulatory Visit: Payer: Self-pay

## 2023-07-29 ENCOUNTER — Other Ambulatory Visit: Payer: Self-pay

## 2023-07-30 ENCOUNTER — Other Ambulatory Visit: Payer: Self-pay

## 2023-08-01 ENCOUNTER — Other Ambulatory Visit: Payer: Self-pay

## 2023-08-02 ENCOUNTER — Other Ambulatory Visit: Payer: Self-pay

## 2023-08-03 ENCOUNTER — Encounter: Payer: Self-pay | Admitting: Family

## 2023-08-03 ENCOUNTER — Other Ambulatory Visit: Payer: Self-pay

## 2023-08-03 ENCOUNTER — Other Ambulatory Visit: Payer: Self-pay | Admitting: Family

## 2023-08-03 DIAGNOSIS — E119 Type 2 diabetes mellitus without complications: Secondary | ICD-10-CM

## 2023-08-03 MED ORDER — METFORMIN HCL ER 500 MG PO TB24: 500.0000 mg | ORAL_TABLET | Freq: Two times a day (BID) | ORAL | Status: DC

## 2023-08-04 ENCOUNTER — Encounter: Payer: Self-pay | Admitting: Family

## 2023-08-04 ENCOUNTER — Other Ambulatory Visit: Payer: Self-pay | Admitting: Family

## 2023-08-04 ENCOUNTER — Other Ambulatory Visit: Payer: Self-pay

## 2023-08-04 DIAGNOSIS — M25552 Pain in left hip: Secondary | ICD-10-CM

## 2023-08-04 MED ORDER — TRAMADOL HCL 50 MG PO TABS
50.0000 mg | ORAL_TABLET | Freq: Three times a day (TID) | ORAL | 2 refills | Status: AC | PRN
  Filled 2023-08-04: qty 30, 10d supply, fill #0

## 2023-08-06 ENCOUNTER — Other Ambulatory Visit: Payer: Self-pay

## 2023-08-06 DIAGNOSIS — R7309 Other abnormal glucose: Secondary | ICD-10-CM

## 2023-08-10 ENCOUNTER — Telehealth: Payer: Self-pay

## 2023-08-10 ENCOUNTER — Other Ambulatory Visit: Payer: Self-pay

## 2023-08-10 ENCOUNTER — Other Ambulatory Visit: Payer: Self-pay | Admitting: Family

## 2023-08-10 DIAGNOSIS — E119 Type 2 diabetes mellitus without complications: Secondary | ICD-10-CM

## 2023-08-10 DIAGNOSIS — I10 Essential (primary) hypertension: Secondary | ICD-10-CM

## 2023-08-10 MED ORDER — METFORMIN HCL ER 500 MG PO TB24: 500.0000 mg | ORAL_TABLET | Freq: Two times a day (BID) | ORAL | Status: DC

## 2023-08-10 NOTE — Telephone Encounter (Signed)
Copied from CRM [id number]. Topic: Clinical - Medication Refill >> Aug 10, 2023 12:30 PM Clayton Bibles wrote: Most Recent Primary Care Visit:  Provider: Allegra Grana  Department: [facility]  Visit Type: MYCHART VIDEO VISIT  Date: 06/22/2023  Medication: metFORMIN (GLUCOPHAGE-XR) 500 MG 24 hr tablet   Has the patient contacted their pharmacy? Yes (Agent: If no, request that the patient contact the pharmacy for the refill. If patient does not wish to contact the pharmacy document the reason why and proceed with request.) (Agent: If yes, when and what did the pharmacy advise?)  Is this the correct pharmacy for this prescription? Yes [facility] If no, delete pharmacy and type the correct one.  This is the patient's preferred pharmacy:  [facility] [address] [place] [address] Phone: [phone] Fax: [phone]  [facility] [address] [address] Phone: [phone] Fax: [phone]  [facility] [address], [place] - [address] [place] [address] Phone: [phone] Fax: [phone]   Has the prescription been filled recently? No  Is the patient out of the medication? She has 2 left  Has the patient been seen for an appointment in the last year OR does the patient have an upcoming appointment? Yes  Can we respond through MyChart? No  Agent: Please be advised that Rx refills may take up to 3 business days. We ask that you follow-up with your pharmacy.

## 2023-08-10 NOTE — Addendum Note (Signed)
Addended by: Kristie Cowman on: 08/10/2023 12:36 PM   Modules accepted: Orders

## 2023-08-10 NOTE — Telephone Encounter (Signed)
Medication has been refilled.

## 2023-08-17 ENCOUNTER — Telehealth: Payer: Self-pay

## 2023-08-17 ENCOUNTER — Encounter: Payer: Self-pay | Admitting: Family

## 2023-08-17 ENCOUNTER — Other Ambulatory Visit: Payer: Self-pay

## 2023-08-17 DIAGNOSIS — E119 Type 2 diabetes mellitus without complications: Secondary | ICD-10-CM

## 2023-08-17 MED ORDER — METFORMIN HCL ER 500 MG PO TB24
500.0000 mg | ORAL_TABLET | Freq: Two times a day (BID) | ORAL | 3 refills | Status: DC
  Filled 2023-08-17: qty 60, 30d supply, fill #0

## 2023-08-17 NOTE — Telephone Encounter (Signed)
 Copied from CRM [id number]. Topic: Clinical - Medication Refill >> Aug 17, 2023  1:48 PM Leila C wrote: Most Recent Primary Care Visit:  Provider: DINEEN ROLLENE MATSU  Department: [facility]  Visit Type: MYCHART VIDEO VISIT  Date: 06/22/2023  Medication: Metformin   Has the patient contacted their pharmacy? Yes, Pam from [facility] pharmacy at [facility] [phone] is calling in and asking metFORMIN  to be sent to the pharmacy.  (Agent: If no, request that the patient contact the pharmacy for the refill. If patient does not wish to contact the pharmacy document the reason why and proceed with request.) (Agent: If yes, when and what did the pharmacy advise?)  Is this the correct pharmacy for this prescription? Yes If no, delete pharmacy and type the correct one.  This is the patient's preferred pharmacy:  [facility] [address] [place] [address] Phone: [phone] Fax: [phone]    Has the prescription been filled recently?   Is the patient out of the medication? Yes  Has the patient been seen for an appointment in the last year OR does the patient have an upcoming appointment?   Can we respond through MyChart?   Agent: Please be advised that Rx refills may take up to 3 business days. We ask that you follow-up with your pharmacy.

## 2023-08-17 NOTE — Telephone Encounter (Signed)
RX sent in pt is aware 

## 2023-08-17 NOTE — Addendum Note (Signed)
 Addended by: [place], Virgin Zellers on: 08/17/2023 02:00 PM   Modules accepted: Orders

## 2023-08-23 ENCOUNTER — Other Ambulatory Visit: Payer: Self-pay

## 2023-08-23 DIAGNOSIS — H18211 Corneal edema secondary to contact lens, right eye: Secondary | ICD-10-CM | POA: Diagnosis not present

## 2023-08-23 MED ORDER — LOTEPREDNOL ETABONATE 0.5 % OP SUSP
1.0000 [drp] | Freq: Four times a day (QID) | OPHTHALMIC | 0 refills | Status: DC
  Filled 2023-08-23 (×2): qty 5, 25d supply, fill #0

## 2023-08-31 ENCOUNTER — Encounter: Payer: Self-pay | Admitting: Family

## 2023-09-10 ENCOUNTER — Other Ambulatory Visit: Payer: Self-pay

## 2023-09-10 ENCOUNTER — Other Ambulatory Visit: Payer: Self-pay | Admitting: Orthopedic Surgery

## 2023-09-10 DIAGNOSIS — M5416 Radiculopathy, lumbar region: Secondary | ICD-10-CM | POA: Diagnosis not present

## 2023-09-10 DIAGNOSIS — M545 Low back pain, unspecified: Secondary | ICD-10-CM

## 2023-09-10 MED ORDER — TRAMADOL HCL 50 MG PO TABS
50.0000 mg | ORAL_TABLET | Freq: Three times a day (TID) | ORAL | 0 refills | Status: AC
  Filled 2023-09-10: qty 60, 20d supply, fill #0

## 2023-09-13 ENCOUNTER — Other Ambulatory Visit
Admission: RE | Admit: 2023-09-13 | Discharge: 2023-09-13 | Disposition: A | Discharge status: Home / Self Care | Payer: Commercial Managed Care - PPO | Attending: Family | Admitting: Family

## 2023-09-13 ENCOUNTER — Other Ambulatory Visit: Payer: Self-pay

## 2023-09-13 DIAGNOSIS — I10 Essential (primary) hypertension: Secondary | ICD-10-CM | POA: Insufficient documentation

## 2023-09-13 DIAGNOSIS — R7309 Other abnormal glucose: Secondary | ICD-10-CM | POA: Insufficient documentation

## 2023-09-13 LAB — HEMOGLOBIN A1C
Hgb A1c MFr Bld: 6.8 % — ABNORMAL HIGH (ref 4.8–5.6)
Mean Plasma Glucose: 148.46 mg/dL

## 2023-09-13 LAB — LIPID PANEL
Cholesterol: 134 mg/dL (ref 0–200)
HDL: 62 mg/dL (ref >40)
LDL Cholesterol: 61 mg/dL (ref 0–99)
Total CHOL/HDL Ratio: 2.2 {ratio}
Triglycerides: 55 mg/dL (ref <150)
VLDL: 11 mg/dL (ref 0–40)

## 2023-09-14 ENCOUNTER — Encounter: Payer: Self-pay | Admitting: Family

## 2023-09-17 ENCOUNTER — Other Ambulatory Visit: Payer: Self-pay

## 2023-09-17 ENCOUNTER — Other Ambulatory Visit (HOSPITAL_COMMUNITY)
Admission: RE | Admit: 2023-09-17 | Discharge: 2023-09-17 | Disposition: A | Discharge status: Home / Self Care | Payer: Commercial Managed Care - PPO | Source: Ambulatory Visit | Attending: Family | Admitting: Family

## 2023-09-17 ENCOUNTER — Encounter: Payer: Self-pay | Admitting: Family

## 2023-09-17 ENCOUNTER — Ambulatory Visit: Payer: Commercial Managed Care - PPO | Admitting: Family

## 2023-09-17 ENCOUNTER — Ambulatory Visit
Admission: RE | Admit: 2023-09-17 | Discharge: 2023-09-17 | Disposition: A | Discharge status: Home / Self Care | Payer: Commercial Managed Care - PPO | Source: Ambulatory Visit | Attending: Orthopedic Surgery | Admitting: Orthopedic Surgery

## 2023-09-17 VITALS — BP 120/70 | HR 86 | Temp 97.8°F | Ht 65.5 in | Wt 166.4 lb

## 2023-09-17 DIAGNOSIS — N898 Other specified noninflammatory disorders of vagina: Secondary | ICD-10-CM | POA: Diagnosis not present

## 2023-09-17 DIAGNOSIS — Z7984 Long term (current) use of oral hypoglycemic drugs: Secondary | ICD-10-CM | POA: Diagnosis not present

## 2023-09-17 DIAGNOSIS — M545 Low back pain, unspecified: Secondary | ICD-10-CM | POA: Diagnosis not present

## 2023-09-17 DIAGNOSIS — R8290 Unspecified abnormal findings in urine: Secondary | ICD-10-CM | POA: Diagnosis not present

## 2023-09-17 DIAGNOSIS — E119 Type 2 diabetes mellitus without complications: Secondary | ICD-10-CM | POA: Diagnosis not present

## 2023-09-17 DIAGNOSIS — Z981 Arthrodesis status: Secondary | ICD-10-CM | POA: Diagnosis not present

## 2023-09-17 DIAGNOSIS — E782 Mixed hyperlipidemia: Secondary | ICD-10-CM | POA: Diagnosis not present

## 2023-09-17 DIAGNOSIS — R82998 Other abnormal findings in urine: Secondary | ICD-10-CM

## 2023-09-17 DIAGNOSIS — R399 Unspecified symptoms and signs involving the genitourinary system: Secondary | ICD-10-CM

## 2023-09-17 LAB — URINALYSIS, ROUTINE W REFLEX MICROSCOPIC
Bilirubin Urine: NEGATIVE
Ketones, ur: NEGATIVE
Leukocytes,Ua: NEGATIVE
Nitrite: NEGATIVE
Specific Gravity, Urine: 1.02 (ref 1.000–1.030)
Total Protein, Urine: 100 — AB
Urine Glucose: NEGATIVE
Urobilinogen, UA: 0.2 (ref 0.0–1.0)
pH: 6.5 (ref 5.0–8.0)

## 2023-09-17 LAB — POCT URINALYSIS DIPSTICK
Bilirubin, UA: NEGATIVE
Glucose, UA: NEGATIVE
Ketones, UA: NEGATIVE
Leukocytes, UA: NEGATIVE
Nitrite, UA: NEGATIVE
Protein, UA: NEGATIVE
Spec Grav, UA: 1.025 (ref 1.010–1.025)
Urobilinogen, UA: 1 U/dL
pH, UA: 6.5 (ref 5.0–8.0)

## 2023-09-17 LAB — MICROALBUMIN / CREATININE URINE RATIO
Creatinine,U: 67.1 mg/dL
Microalb Creat Ratio: 53.7 mg/g — ABNORMAL HIGH (ref 0.0–30.0)
Microalb, Ur: 36.1 mg/dL — ABNORMAL HIGH (ref 0.0–1.9)

## 2023-09-17 MED ORDER — FLUCONAZOLE 150 MG PO TABS
150.0000 mg | ORAL_TABLET | Freq: Once | ORAL | 1 refills | Status: AC
  Filled 2023-09-17: qty 2, 3d supply, fill #0

## 2023-09-17 MED ORDER — NITROFURANTOIN MONOHYD MACRO 100 MG PO CAPS
100.0000 mg | ORAL_CAPSULE | Freq: Two times a day (BID) | ORAL | 0 refills | Status: DC
  Filled 2023-09-17: qty 10, 5d supply, fill #0

## 2023-09-17 NOTE — Assessment & Plan Note (Signed)
Point-of-care reveals a few red blood cells. H/o renal stone.   Negative nitrites, negative leukocytes.  Provided patient with prescription for Macrobid to start over the weekend if symptoms persist or worsen.  Otherwise, we plan to wait on urine culture

## 2023-09-17 NOTE — Assessment & Plan Note (Signed)
Excellent control.  LDL less than 70.  Continue Crestor 40 mg

## 2023-09-17 NOTE — Assessment & Plan Note (Signed)
Lab Results  Component Value Date   HGBA1C 6.8 (H) 09/13/2023  Improved.  Continue   metformin 500 mg BID . We may consider Jardiance in the future.  Pending urine microalbumin.

## 2023-09-17 NOTE — Progress Notes (Signed)
Assessment & Plan:  Abnormal urine -     Nitrofurantoin Monohyd Macro; Take 1 capsule (100 mg total) by mouth 2 (two) times daily. Take with food.  Dispense: 10 capsule; Refill: 0 -     Fluconazole; Take 1 tablet (150 mg total) by mouth once for 1 dose. Take one tablet PO once. If sxs persist, may take one tablet PO 3 days later.  Dispense: 2 tablet; Refill: 1  Vaginal itching -     Cervicovaginal ancillary only  Foamy urine -     POCT urinalysis dipstick -     Urine Culture -     Urinalysis, Routine w reflex microscopic -     Microalbumin / creatinine urine ratio -     Nitrofurantoin Monohyd Macro; Take 1 capsule (100 mg total) by mouth 2 (two) times daily. Take with food.  Dispense: 10 capsule; Refill: 0 -     Fluconazole; Take 1 tablet (150 mg total) by mouth once for 1 dose. Take one tablet PO once. If sxs persist, may take one tablet PO 3 days later.  Dispense: 2 tablet; Refill: 1  Controlled type 2 diabetes mellitus without complication, without long-term current use of insulin (HCC) Assessment & Plan: Lab Results  Component Value Date   HGBA1C 6.8 (H) 09/13/2023  Improved.  Continue   metformin 500 mg BID . We may consider Jardiance in the future.  Pending urine microalbumin.   UTI symptoms Assessment & Plan: Point-of-care reveals a few red blood cells. H/o renal stone.   Negative nitrites, negative leukocytes.  Provided patient with prescription for Macrobid to start over the weekend if symptoms persist or worsen.  Otherwise, we plan to wait on urine culture   Mixed hyperlipidemia Assessment & Plan: Excellent control.  LDL less than 70.  Continue Crestor 40 mg      Return precautions given.   Risks, benefits, and alternatives of the medications and treatment plan prescribed today were discussed, and patient expressed understanding.   Education regarding symptom management and diagnosis given to patient on AVS either electronically or printed.  Return in about 3  months (around 12/15/2023).  Rennie Plowman, FNP  Subjective:    Patient ID: Alyssa Schultz, female    DOB: 01-03-1966, 58 y.o.   MRN: [id number]  CC: Alyssa Schultz is a 58 y.o. female who presents today for an acute visit.    HPI: She complains of 'foam' in urine past couple of days.   No changes to vaginal discharge, dysuria, fever, N, V, hematuria.  She requests diflucan as antibiotics cause yeast infection.       She has episodic right low back pain. Following with orthopedic.   Scheduled MRI lumbar for low back pain ordered by Dr Yevette Edwards.   Allergies: Haemophilus b polysaccharide vaccine, Influenza vaccine live, Influenza vaccines, Semaglutide, Hydrocodone, Morphine and codeine, and Prednisone Current Outpatient Medications on File Prior to Visit  Medication Sig Dispense Refill   aspirin EC 81 MG EC tablet Take 1 tablet (81 mg total) by mouth daily. 30 tablet 0   Blood Glucose Monitoring Suppl (FREESTYLE FREEDOM LITE) w/Device KIT      Blood Glucose Monitoring Suppl (FREESTYLE FREEDOM LITE) w/Device KIT Use as directed 1 kit 0   ciclopirox (PENLAC) 8 % solution Apply 1 Application topically at bedtime. Apply to nail/surrounding skin and daily over previous coat. Every 7 days remove with alcohol and continue. 6.6 mL 11   Continuous Blood Gluc Sensor (FREESTYLE LIBRE 3  SENSOR) MISC Place 1 sensor on the skin every 14 days. Use to check glucose continuously 2 each 3   cyclobenzaprine (FLEXERIL) 5 MG tablet Take 1-2 tablets (5-10 mg total) by mouth at bedtime as needed. 60 tablet 1   estradiol (ESTRACE) 0.1 MG/GM vaginal cream Estrogen Cream Instruction  Discard applicator  Apply pea sized amount to tip of finger to urethra before bed. Wash hands well after application. Use Monday, Wednesday and Friday 42.5 g 12   gabapentin (NEURONTIN) 100 MG capsule Take 1 capsule (100 mg total) by mouth 2 (two) times daily. 180 capsule 1   glucose blood (FREESTYLE LITE) test strip Use to  check blood sugar 2 (two) times daily. 200 each 4   losartan (COZAAR) 25 MG tablet Take 1 tablet (25 mg total) by mouth once a day 30 tablet 11   loteprednol (LOTEMAX) 0.5 % ophthalmic suspension Place 1 drop into the right eye 4 (four) times daily. 5 mL 0   meclizine (ANTIVERT) 25 MG tablet Take 2 tablets (50 mg total) by mouth 3 (three) times daily as needed for dizziness (Patient taking differently: Take 25 mg by mouth as needed for dizziness.) 60 tablet 1   metFORMIN (GLUCOPHAGE-XR) 500 MG 24 hr tablet Take 1 tablet (500 mg total) by mouth 2 (two) times daily. 60 tablet 3   nystatin cream (MYCOSTATIN) Apply 1 Application topically 2 (two) times daily. 30 g 1   ondansetron (ZOFRAN-ODT) 4 MG disintegrating tablet Take 1 tablet (4 mg total) by mouth every 8 (eight) hours as needed for nausea or vomiting. 30 tablet 0   rosuvastatin (CRESTOR) 40 MG tablet Take 1 tablet (40 mg total) by mouth daily. 90 tablet 3   terbinafine (LAMISIL) 250 MG tablet Take 1 tablet (250 mg total) by mouth daily. 30 tablet 0   traMADol (ULTRAM) 50 MG tablet Take 1 tablet (50 mg total) by mouth 3 (three) times daily as needed. 30 tablet 2   traMADol (ULTRAM) 50 MG tablet Take 1 tablet (50 mg total) by mouth every 6 (six) - 8 (eight) hours. 60 tablet 0   trimethoprim-polymyxin b (POLYTRIM) ophthalmic solution Place 1 drop into the left eye every 4 (four) hours. 10 mL 0   TRUEPLUS LANCETS 30G MISC USE AS DIRECTED 100 each 5   valACYclovir (VALTREX) 500 MG tablet Take 2 tablets (1,000 mg total) by mouth 2 (two) times daily. Take prn cold sore on on day 12 tablet 1   linaclotide (LINZESS) 72 MCG capsule Take 1 capsule (72 mcg total) by mouth daily before breakfast. 90 capsule 0   No current facility-administered medications on file prior to visit.    Review of Systems  Constitutional:  Negative for chills and fever.  Respiratory:  Negative for cough.   Cardiovascular:  Negative for chest pain and palpitations.   Gastrointestinal:  Negative for nausea and vomiting.  Genitourinary:  Negative for dysuria and pelvic pain.  Musculoskeletal:  Positive for back pain.      Objective:    BP 130/82   Pulse 86   Temp 97.8 F (36.6 C) (Oral)   Ht 5' 5.5" (1.664 m)   Wt 166 lb 6.4 oz (75.5 kg)   LMP 07/24/2007 Comment: ablation in 2008-pt reports she went through menopause after this  SpO2 96%   BMI 27.27 kg/m   BP Readings from Last 3 Encounters:  09/17/23 130/82  06/22/23 117/72  05/21/23 110/70   Wt Readings from Last 3 Encounters:  09/17/23 166  lb 6.4 oz (75.5 kg)  06/22/23 169 lb (76.7 kg)  05/21/23 177 lb 3.2 oz (80.4 kg)    Physical Exam Vitals reviewed.  Constitutional:      Appearance: She is well-developed.  Cardiovascular:     Rate and Rhythm: Normal rate and regular rhythm.     Pulses: Normal pulses.     Heart sounds: Normal heart sounds.  Pulmonary:     Effort: Pulmonary effort is normal.     Breath sounds: Normal breath sounds. No wheezing, rhonchi or rales.  Abdominal:     Tenderness: There is no right CVA tenderness or left CVA tenderness.  Musculoskeletal:     Lumbar back: Tenderness present. No bony tenderness. Normal range of motion.       Back:     Comments: Left low SI joint. No rash.  Skin:    General: Skin is warm and dry.  Neurological:     Mental Status: She is alert.  Psychiatric:        Speech: Speech normal.        Behavior: Behavior normal.        Thought Content: Thought content normal.

## 2023-09-18 LAB — URINE CULTURE
MICRO NUMBER:: 16025870
Result:: NO GROWTH
SPECIMEN QUALITY:: ADEQUATE

## 2023-09-20 ENCOUNTER — Encounter: Payer: Self-pay | Admitting: Family

## 2023-09-20 ENCOUNTER — Other Ambulatory Visit: Payer: Self-pay | Admitting: Family

## 2023-09-20 DIAGNOSIS — E119 Type 2 diabetes mellitus without complications: Secondary | ICD-10-CM

## 2023-09-20 LAB — CERVICOVAGINAL ANCILLARY ONLY
Bacterial Vaginitis (gardnerella): NEGATIVE
Candida Glabrata: NEGATIVE
Candida Vaginitis: NEGATIVE
Comment: NEGATIVE
Comment: NEGATIVE
Comment: NEGATIVE

## 2023-09-21 ENCOUNTER — Encounter: Payer: Self-pay | Admitting: Family

## 2023-09-21 ENCOUNTER — Other Ambulatory Visit
Admission: RE | Admit: 2023-09-21 | Discharge: 2023-09-21 | Disposition: A | Discharge status: Home / Self Care | Payer: Commercial Managed Care - PPO | Attending: Family | Admitting: Family

## 2023-09-21 DIAGNOSIS — E119 Type 2 diabetes mellitus without complications: Secondary | ICD-10-CM | POA: Insufficient documentation

## 2023-09-21 LAB — BASIC METABOLIC PANEL
Anion gap: 5 (ref 5–15)
BUN: 15 mg/dL (ref 6–20)
CO2: 27 mmol/L (ref 22–32)
Calcium: 9.2 mg/dL (ref 8.9–10.3)
Chloride: 105 mmol/L (ref 98–111)
Creatinine, Ser: 0.75 mg/dL (ref 0.44–1.00)
GFR, Estimated: >60 mL/min (ref >60)
Glucose, Bld: 134 mg/dL — ABNORMAL HIGH (ref 70–99)
Potassium: 3.5 mmol/L (ref 3.5–5.1)
Sodium: 137 mmol/L (ref 135–145)

## 2023-09-23 ENCOUNTER — Encounter: Payer: Self-pay | Admitting: Family

## 2023-09-24 DIAGNOSIS — M7062 Trochanteric bursitis, left hip: Secondary | ICD-10-CM | POA: Diagnosis not present

## 2023-09-27 ENCOUNTER — Encounter: Payer: Commercial Managed Care - PPO | Admitting: Family Medicine

## 2023-09-30 ENCOUNTER — Other Ambulatory Visit (INDEPENDENT_AMBULATORY_CARE_PROVIDER_SITE_OTHER): Payer: Commercial Managed Care - PPO | Admitting: Radiology

## 2023-09-30 ENCOUNTER — Encounter: Payer: Self-pay | Admitting: Family Medicine

## 2023-09-30 ENCOUNTER — Ambulatory Visit: Payer: Commercial Managed Care - PPO | Admitting: Family Medicine

## 2023-09-30 VITALS — BP 120/78 | HR 82 | Ht 65.5 in | Wt 167.5 lb

## 2023-09-30 DIAGNOSIS — M25552 Pain in left hip: Secondary | ICD-10-CM

## 2023-09-30 NOTE — Progress Notes (Signed)
Primary Care / Sports Medicine Office Visit  Patient Information:  Patient ID: Alyssa Schultz, female DOB: [birth year] Age: 58 y.o. MRN: [id number]   Alyssa Schultz is a pleasant 58 y.o. female presenting with the following:  Chief Complaint  Patient presents with   Hip Pain    Patient having left hip pain. She has been having this hip/leg pain since August. She has been taking tumeric for the inflammation but it has helped but so much. She would like to discuss getting an injection.    Vitals:   09/30/23 1517  BP: 120/78  Pulse: 82  SpO2: 96%   Vitals:   09/30/23 1517  Weight: 167 lb 8 oz (76 kg)  Height: 5' 5.5" (1.664 m)   Body mass index is 27.45 kg/m.  MR LUMBAR SPINE WO CONTRAST Result Date: 10/03/2023 CLINICAL DATA:  Low back, left hip and lower extremity pain for 6 months. History of prior lumbar surgery 07/26/2019. EXAM: MRI LUMBAR SPINE WITHOUT CONTRAST TECHNIQUE: Multiplanar, multisequence MR imaging of the lumbar spine was performed. No intravenous contrast was administered. COMPARISON:  MRI lumbar spine 05/19/2019. FINDINGS: Segmentation:  Standard. Alignment: Trace anterolisthesis L5 on S1 is again seen. Alignment is otherwise normal. Vertebrae: No fracture, evidence of discitis, or bone lesion. The patient has undergone L5-S1 laminectomy and fusion since the prior exam. Conus medullaris and cauda equina: Conus extends to the L2 level. Conus and cauda equina appear normal. Paraspinal and other soft tissues: Negative. Disc levels: T10-11 and T11-12 are imaged in the sagittal plane only. A shallow disc bulge is seen at T10-11 and there is a shallow right paracentral protrusion at T11-12. The central canal and foramina appear open at both levels. T12-L1: Negative. L1-2: Negative. L2-3: Negative. L3-4: There is a shallow disc bulge with mild-to-moderate facet arthropathy and ligamentum flavum thickening. Mild central canal stenosis is again seen. The foramina widely patent.  L4-5: There is a shallow disc bulge and mild-to-moderate facet arthropathy. Mild left foraminal narrowing is unchanged. The central canal and right foramen are open. L5-S1: Status post fusion.  No stenosis or evidence of complication. IMPRESSION: 1. Status post L5-S1 fusion without evidence of complication. 2. No change in mild central canal stenosis at L3-4. 3. No change in mild left foraminal narrowing at L4-5. Electronically Signed   By: Drusilla Kanner M.D.   On: 10/03/2023 09:15     Independent interpretation of notes and tests performed by another provider:   None  Procedures performed:   Procedure:  Injection of left greater trochanter under ultrasound guidance. Ultrasound guidance utilized for out of plane approach, hypoechoic region consistent with bursitis Samsung HS60 device utilized with permanent recording / reporting. Verbal informed consent obtained and verified. Skin prepped in a sterile fashion. Ethyl chloride for topical local analgesia.  Completed without difficulty and tolerated well. Medication: triamcinolone acetonide 40 mg/mL suspension for injection 1 mL total and 2 mL lidocaine 1% without epinephrine utilized for needle placement anesthetic Advised to contact for fevers/chills, erythema, induration, drainage, or persistent bleeding.   Pertinent History, Exam, Impression, and Recommendations:   Problem List Items Addressed This Visit     Greater trochanteric pain syndrome of left lower extremity - Primary   History of Present Illness Alyssa Schultz is a 58 year old female with a history of back surgeries, DM2, and TIA who presents with left lateral hip pain.  She experiences left hip pain radiating down the leg, worsened by walking and twisting movements.  She has been using dietary turmeric to manage inflammation due to contraindications with certain medications following a mini stroke in 2021.  She has a history of two back surgeries and initially considered the  pain might be related to her back issues. An MRI on January 31st excluded a spinal cause per her spinal specialist, leading to a diagnosis of greater trochanteric pain syndrome. The persistent pain affects her daily activities, including her regular walking exercise.  She received an unguided cortisone injection in August 2024, which provided only two days of relief. She is cautious about further injections due to diabetes and potential effects on blood sugar levels.  She has diabetes, with a recent hemoglobin A1c of 6.8, improved from 7.7 in January.   She is physically active, walking daily, and has recently purchased new shoes to support her exercise routine. She is concerned that her walking routine might contribute to her hip pain.  Physical Exam MUSCULOSKELETAL: Noted tenderness at left greater trochanter. Non-tender at left sacroiliac joint. Slight increased tenderness at posterior lateral aspect of left greater trochanter. Diminished range of motion due to tightness about the left hip. NEUROLOGICAL: Positive straight leg raise test on left. FADIR test localizes pain to knee and lateral left hip. Piriformis test negative. +FABER.  Results LABS HbA1c: 6.8 (08/2023)  Assessment and Plan Greater Trochanteric Pain Syndrome Pain localized to the left greater trochanter with radiation down the leg. Discussed the relationship between back and hip pain. The pain is likely due to tight and weak muscles around the hip. -Administer cortisone injection today under ultrasound guidance to reduce inflammation. -Advise rest for two days post-injection, followed by resumption of normal activities. -Provide home exercises to improve hip muscle strength and flexibility, to be started next week once the cortisone has taken effect. -Check in 2-4 weeks via MyChart to assess response to treatment.      Relevant Orders   [place] LIMITED JOINT SPACE STRUCTURES LOW LEFT     Orders & Medications Medications:  No orders of the defined types were placed in this encounter.  Orders Placed This Encounter  Procedures   [place] LIMITED JOINT SPACE STRUCTURES LOW LEFT     Return if symptoms worsen or fail to improve.     Jerrol Banana, MD, [facility]   Primary Care Sports Medicine Primary Care and Sports Medicine at [facility]

## 2023-09-30 NOTE — Assessment & Plan Note (Addendum)
History of Present Illness Alyssa Schultz is a 58 year old female with a history of back surgeries, DM2, and TIA who presents with left lateral hip pain.  She experiences left hip pain radiating down the leg, worsened by walking and twisting movements. She has been using dietary turmeric to manage inflammation due to contraindications with certain medications following a mini stroke in 2021.  She has a history of two back surgeries and initially considered the pain might be related to her back issues. An MRI on January 31st excluded a spinal cause per her spinal specialist, leading to a diagnosis of greater trochanteric pain syndrome. The persistent pain affects her daily activities, including her regular walking exercise.  She received an unguided cortisone injection in August 2024, which provided only two days of relief. She is cautious about further injections due to diabetes and potential effects on blood sugar levels.  She has diabetes, with a recent hemoglobin A1c of 6.8, improved from 7.7 in January.   She is physically active, walking daily, and has recently purchased new shoes to support her exercise routine. She is concerned that her walking routine might contribute to her hip pain.  Physical Exam MUSCULOSKELETAL: Noted tenderness at left greater trochanter. Non-tender at left sacroiliac joint. Slight increased tenderness at posterior lateral aspect of left greater trochanter. Diminished range of motion due to tightness about the left hip. NEUROLOGICAL: Positive straight leg raise test on left. FADIR test localizes pain to knee and lateral left hip. Piriformis test negative. +FABER.  Results LABS HbA1c: 6.8 (08/2023)  Assessment and Plan Greater Trochanteric Pain Syndrome Pain localized to the left greater trochanter with radiation down the leg. Discussed the relationship between back and hip pain. The pain is likely due to tight and weak muscles around the hip. -Administer cortisone  injection today under ultrasound guidance to reduce inflammation. -Advise rest for two days post-injection, followed by resumption of normal activities. -Provide home exercises to improve hip muscle strength and flexibility, to be started next week once the cortisone has taken effect. -Check in 2-4 weeks via MyChart to assess response to treatment.

## 2023-09-30 NOTE — Patient Instructions (Addendum)
You have just been given a cortisone injection to reduce pain and inflammation. After the injection you may notice immediate relief of pain as a result of the Lidocaine. It is important to rest the area of the injection for 24 to 48 hours after the injection. There is a possibility of some temporary increased discomfort and swelling for up to 72 hours until the cortisone begins to work. If you do have pain, simply rest the joint and use ice. If you can tolerate over the counter medications, you can try Tylenol, for added relief per package instructions. - Relative rest x 2 days then return to normal activity - Start home exercises once symptoms respond to cortisone - Contact at 2-4 weeks or beyond if symptoms persist without improvement despite the above

## 2023-10-04 ENCOUNTER — Other Ambulatory Visit: Payer: Self-pay

## 2023-10-04 DIAGNOSIS — E1129 Type 2 diabetes mellitus with other diabetic kidney complication: Secondary | ICD-10-CM | POA: Diagnosis not present

## 2023-10-04 DIAGNOSIS — R809 Proteinuria, unspecified: Secondary | ICD-10-CM | POA: Diagnosis not present

## 2023-10-04 DIAGNOSIS — E7800 Pure hypercholesterolemia, unspecified: Secondary | ICD-10-CM | POA: Diagnosis not present

## 2023-10-04 MED ORDER — JARDIANCE 10 MG PO TABS
10.0000 mg | ORAL_TABLET | Freq: Every morning | ORAL | 11 refills | Status: AC
  Filled 2023-10-04: qty 30, 30d supply, fill #0
  Filled 2023-11-02: qty 30, 30d supply, fill #1
  Filled 2023-12-06: qty 30, 30d supply, fill #2
  Filled 2024-01-04: qty 30, 30d supply, fill #3
  Filled 2024-02-01: qty 30, 30d supply, fill #4
  Filled 2024-03-02: qty 30, 30d supply, fill #5
  Filled 2024-04-07: qty 30, 30d supply, fill #6
  Filled 2024-05-02: qty 30, 30d supply, fill #7
  Filled 2024-06-02: qty 30, 30d supply, fill #8
  Filled 2024-07-25: qty 30, 30d supply, fill #9

## 2023-10-05 ENCOUNTER — Encounter: Payer: Self-pay | Admitting: Family

## 2023-10-11 ENCOUNTER — Other Ambulatory Visit: Payer: Self-pay

## 2023-10-25 ENCOUNTER — Encounter: Payer: Self-pay | Admitting: Family

## 2023-10-25 DIAGNOSIS — E119 Type 2 diabetes mellitus without complications: Secondary | ICD-10-CM

## 2023-10-27 ENCOUNTER — Other Ambulatory Visit: Payer: Self-pay

## 2023-10-27 NOTE — Telephone Encounter (Signed)
 Spoke to pt and she would like to get labs done again

## 2023-10-28 ENCOUNTER — Other Ambulatory Visit: Payer: Self-pay

## 2023-10-28 ENCOUNTER — Other Ambulatory Visit
Admission: RE | Admit: 2023-10-28 | Discharge: 2023-10-28 | Disposition: A | Discharge status: Home / Self Care | Attending: Family | Admitting: Family

## 2023-10-28 DIAGNOSIS — E119 Type 2 diabetes mellitus without complications: Secondary | ICD-10-CM | POA: Insufficient documentation

## 2023-10-30 LAB — MICROALBUMIN / CREATININE URINE RATIO
Creatinine, Urine: 42.3 mg/dL
Microalb Creat Ratio: 329 mg/g{creat} — ABNORMAL HIGH (ref 0–29)
Microalb, Ur: 139.1 ug/mL — ABNORMAL HIGH

## 2023-11-01 ENCOUNTER — Other Ambulatory Visit: Payer: Self-pay

## 2023-11-02 ENCOUNTER — Other Ambulatory Visit: Payer: Self-pay

## 2023-11-07 ENCOUNTER — Other Ambulatory Visit: Payer: Self-pay

## 2023-11-08 ENCOUNTER — Telehealth: Payer: Self-pay

## 2023-11-08 ENCOUNTER — Other Ambulatory Visit: Payer: Self-pay

## 2023-11-08 NOTE — Telephone Encounter (Signed)
 Please review and advise if this is something you would do.   JM

## 2023-11-08 NOTE — Telephone Encounter (Signed)
 Pt would like to have all labs due before her appointment with you

## 2023-11-08 NOTE — Telephone Encounter (Signed)
 Patient ask about a cyst on back of her leg and ask if can be drained.

## 2023-11-09 ENCOUNTER — Other Ambulatory Visit: Payer: Self-pay | Admitting: Family

## 2023-11-09 ENCOUNTER — Encounter: Payer: Self-pay | Admitting: Family

## 2023-11-09 DIAGNOSIS — E119 Type 2 diabetes mellitus without complications: Secondary | ICD-10-CM

## 2023-11-09 NOTE — Telephone Encounter (Signed)
 Please review patients response and let me know if you will see for aspiration?

## 2023-11-10 DIAGNOSIS — E785 Hyperlipidemia, unspecified: Secondary | ICD-10-CM | POA: Diagnosis not present

## 2023-11-10 DIAGNOSIS — Z780 Asymptomatic menopausal state: Secondary | ICD-10-CM | POA: Diagnosis not present

## 2023-11-10 DIAGNOSIS — I10 Essential (primary) hypertension: Secondary | ICD-10-CM | POA: Diagnosis not present

## 2023-11-10 DIAGNOSIS — E119 Type 2 diabetes mellitus without complications: Secondary | ICD-10-CM | POA: Diagnosis not present

## 2023-11-10 DIAGNOSIS — Z01419 Encounter for gynecological examination (general) (routine) without abnormal findings: Secondary | ICD-10-CM | POA: Diagnosis not present

## 2023-11-12 ENCOUNTER — Other Ambulatory Visit (INDEPENDENT_AMBULATORY_CARE_PROVIDER_SITE_OTHER): Payer: Self-pay | Admitting: Radiology

## 2023-11-12 ENCOUNTER — Encounter: Payer: Self-pay | Admitting: Family Medicine

## 2023-11-12 ENCOUNTER — Ambulatory Visit: Admitting: Family Medicine

## 2023-11-12 VITALS — BP 120/72 | HR 103 | Ht 65.5 in | Wt 168.0 lb

## 2023-11-12 DIAGNOSIS — M25552 Pain in left hip: Secondary | ICD-10-CM | POA: Diagnosis not present

## 2023-11-12 DIAGNOSIS — M25862 Other specified joint disorders, left knee: Secondary | ICD-10-CM | POA: Diagnosis not present

## 2023-11-12 NOTE — Assessment & Plan Note (Signed)
 She experiences persistent hip pain that has not improved despite previous interventions (left greater trochanter cortisone injection under ultrasound guidance performed on 09/30/2023). The pain is alleviated with ice and Tylenol, which she finds effective. An MRI of the lumbar spine conducted in January 2025 was ordered by her spine surgeon. She attributes some of her hip discomfort to her shoes and plans to get new ones.  RADIOLOGY Lumbar spine MRI: L3-L4 and L4-L5 levels show mild to moderate disc bulge, mild narrowing on the left, no stenosis at L5-S1, mild to moderate arthritis, and thickening of ligamentum flavum. (09/17/2023)  Left hip pain in the setting of lumbosacral degenerative changes  Chronic left lateral hip pain most consistent with greater trochanteric pain syndrome, recalcitrant to ultrasound-guided injection. MRI shows mild to moderate multilevel degenerative changes. Symptoms suggest possible nerve involvement affecting the left hip given dermatomal distribution.  - Refer to physical therapy for dry needling and flexibility exercises for low back, buttock, and hip. - Consider interventional spine injections if no relief after six weeks of physical therapy. - Advise continued use of orthotics and consider new footwear for alignment and pain reduction.

## 2023-11-13 DIAGNOSIS — M25862 Other specified joint disorders, left knee: Secondary | ICD-10-CM | POA: Insufficient documentation

## 2023-11-13 NOTE — Patient Instructions (Signed)
 Patient Care Plan  1. Left Knee Ganglion cyst:    - Undergo aspiration and steroid injection for the cyst behind your left knee.    - Wrap the knee with an ACE bandage for two days for compression and support.  Remove at bedtime, loosen for comfort and circulation.    - Apply ice to the back of the knee and take Tramadol as needed for pain.  2. Knee Osteoarthritis:    - Attend physical therapy sessions focusing on stability and strength of the left knee, especially the patellofemoral joint.    - Use ice and acetaminophen to manage any knee pain.  3. Left Hip Pain:    - Participate in physical therapy for consideration of dry needling and flexibility exercises targeting your lower back, buttock, and hip.    - Continue using orthotics and consider purchasing new footwear to help with alignment and pain relief.    - If no relief after six weeks of physical therapy, interventional spine injections may be considered.  Contact our office for a referral if needed.  Red Flags  - Contact your healthcare provider if you experience increased pain, swelling, or any new symptoms in your knee or hip.

## 2023-11-13 NOTE — Progress Notes (Signed)
 Primary Care / Sports Medicine Office Visit  Patient Information:  Patient ID: Alyssa Schultz, female DOB: 03/30/1966 Age: 58 y.o. MRN: [id number]   Alyssa Schultz is a pleasant 58 y.o. female presenting with the following:  Chief Complaint  Patient presents with   Aspiration    Patient believes she has a cyst on the posterior aspect of her left knee. She would like it to be aspirated today if possible.     Vitals:   11/12/23 1346  BP: 120/72  Pulse: (!) 103  SpO2: 98%   Vitals:   11/12/23 1346  Weight: 168 lb (76.2 kg)  Height: 5' 5.5" (1.664 m)   Body mass index is 27.53 kg/m.  No results found.   Independent interpretation of notes and tests performed by another provider:   None  Procedures performed:   Procedure:  Injection following aspiration of posterior left knee under ultrasound guidance. Ultrasound guidance utilized for in-plane approach to posterior popliteal cyst Samsung HS60 device utilized with permanent recording / reporting. Verbal informed consent obtained and verified. Skin prepped in a sterile fashion. Ethyl chloride for topical local analgesia.  Completed without difficulty and tolerated well. Aspirate: 4 cc cystic fluid, most consistent with aspiration of the ganglion cyst Medication: triamcinolone acetonide 40 mg/mL suspension for injection 1 mL total and 2 mL lidocaine 1% without epinephrine utilized for needle placement anesthetic Advised to contact for fevers/chills, erythema, induration, drainage, or persistent bleeding.   Pertinent History, Exam, Impression, and Recommendations:   Problem List Items Addressed This Visit     Cyst of left knee joint - Primary   She has noticed a new swelling behind her left knee, which she initially attributed to weight. The swelling has been present for a few months and is increasing in size, though it fluctuates in prominence. No significant knee discomfort is reported beyond occasional  popping.  Knee osteoarthritis Mild osteoarthritis of the left knee, particularly involving the patellofemoral joint. Prior review of 2017 X-rays show minor degenerative changes with small osteophytes and an enthesophyte focal about the patella. - Include left knee in physical therapy referral for stability and strength, focusing on the patellofemoral joint. - Advise continued use of ice and acetaminophen for pain management.  Ganglion cyst in the left popliteal fossa Ganglion cyst in the left popliteal fossa, not communicating with the knee joint, filled with gelatinous fluid. Likely benign and not causing significant pain. - Aspirate the ganglion cyst and inject with steroid. - Wrap knee with ACE bandage for compression and support for two days. - Instruct to apply ice to the back of the knee and take tramadol for pain management.      Relevant Orders   [place] LIMITED JOINT SPACE STRUCTURES LOW LEFT   Ambulatory referral to Physical Therapy   Greater trochanteric pain syndrome of left lower extremity   She experiences persistent hip pain that has not improved despite previous interventions (left greater trochanter cortisone injection under ultrasound guidance performed on 09/30/2023). The pain is alleviated with ice and Tylenol, which she finds effective. An MRI of the lumbar spine conducted in January 2025 was ordered by her spine surgeon. She attributes some of her hip discomfort to her shoes and plans to get new ones.  RADIOLOGY Lumbar spine MRI: L3-L4 and L4-L5 levels show mild to moderate disc bulge, mild narrowing on the left, no stenosis at L5-S1, mild to moderate arthritis, and thickening of ligamentum flavum. (09/17/2023)  Left hip pain in the  setting of lumbosacral degenerative changes  Chronic left lateral hip pain most consistent with greater trochanteric pain syndrome, recalcitrant to ultrasound-guided injection. MRI shows mild to moderate multilevel degenerative changes. Symptoms  suggest possible nerve involvement affecting the left hip given dermatomal distribution.  - Refer to physical therapy for dry needling and flexibility exercises for low back, buttock, and hip. - Consider interventional spine injections if no relief after six weeks of physical therapy. - Advise continued use of orthotics and consider new footwear for alignment and pain reduction.      Relevant Orders   Ambulatory referral to Physical Therapy     Orders & Medications Medications: No orders of the defined types were placed in this encounter.  Orders Placed This Encounter  Procedures   [place] LIMITED JOINT SPACE STRUCTURES LOW LEFT   Ambulatory referral to Physical Therapy     No follow-ups on file.     Jerrol Banana, MD, [facility]   Primary Care Sports Medicine Primary Care and Sports Medicine at [facility]

## 2023-11-13 NOTE — Assessment & Plan Note (Signed)
 She has noticed a new swelling behind her left knee, which she initially attributed to weight. The swelling has been present for a few months and is increasing in size, though it fluctuates in prominence. No significant knee discomfort is reported beyond occasional popping.  Knee osteoarthritis Mild osteoarthritis of the left knee, particularly involving the patellofemoral joint. Prior review of 2017 X-rays show minor degenerative changes with small osteophytes and an enthesophyte focal about the patella. - Include left knee in physical therapy referral for stability and strength, focusing on the patellofemoral joint. - Advise continued use of ice and acetaminophen for pain management.  Ganglion cyst in the left popliteal fossa Ganglion cyst in the left popliteal fossa, not communicating with the knee joint, filled with gelatinous fluid. Likely benign and not causing significant pain. - Aspirate the ganglion cyst and inject with steroid. - Wrap knee with ACE bandage for compression and support for two days. - Instruct to apply ice to the back of the knee and take tramadol for pain management.

## 2023-11-26 ENCOUNTER — Encounter: Payer: Self-pay | Admitting: Family

## 2023-11-30 ENCOUNTER — Other Ambulatory Visit: Payer: Self-pay

## 2023-11-30 DIAGNOSIS — J301 Allergic rhinitis due to pollen: Secondary | ICD-10-CM | POA: Diagnosis not present

## 2023-11-30 DIAGNOSIS — H6983 Other specified disorders of Eustachian tube, bilateral: Secondary | ICD-10-CM | POA: Diagnosis not present

## 2023-11-30 DIAGNOSIS — H6123 Impacted cerumen, bilateral: Secondary | ICD-10-CM | POA: Diagnosis not present

## 2023-11-30 MED ORDER — FLUTICASONE PROPIONATE 50 MCG/ACT NA SUSP
2.0000 | Freq: Every day | NASAL | 6 refills | Status: AC
Start: 2023-11-30 — End: ?
  Filled 2023-11-30: qty 16, 30d supply, fill #0

## 2023-12-06 ENCOUNTER — Other Ambulatory Visit: Payer: Self-pay

## 2023-12-07 ENCOUNTER — Other Ambulatory Visit: Payer: Self-pay

## 2023-12-08 ENCOUNTER — Telehealth: Admitting: Physician Assistant

## 2023-12-08 ENCOUNTER — Other Ambulatory Visit: Payer: Self-pay

## 2023-12-08 DIAGNOSIS — B3731 Acute candidiasis of vulva and vagina: Secondary | ICD-10-CM | POA: Diagnosis not present

## 2023-12-08 MED ORDER — FLUCONAZOLE 150 MG PO TABS
ORAL_TABLET | ORAL | 0 refills | Status: DC
Start: 2023-12-08 — End: 2024-03-20
  Filled 2023-12-08: qty 2, 3d supply, fill #0

## 2023-12-08 NOTE — Progress Notes (Signed)
 I have spent 5 minutes in review of e-visit questionnaire, review and updating patient chart, medical decision making and response to patient.   Piedad Climes, PA-C

## 2023-12-08 NOTE — Progress Notes (Signed)

## 2023-12-14 ENCOUNTER — Other Ambulatory Visit
Admission: RE | Admit: 2023-12-14 | Discharge: 2023-12-14 | Disposition: A | Discharge status: Home / Self Care | Attending: Family | Admitting: Family

## 2023-12-14 DIAGNOSIS — E119 Type 2 diabetes mellitus without complications: Secondary | ICD-10-CM | POA: Insufficient documentation

## 2023-12-14 LAB — HEMOGLOBIN A1C
Hgb A1c MFr Bld: 6.6 % — ABNORMAL HIGH (ref 4.8–5.6)
Mean Plasma Glucose: 142.72 mg/dL

## 2023-12-14 NOTE — Addendum Note (Signed)
 Addended by: Joelyn Music on: 12/14/2023 07:43 AM   Modules accepted: Orders

## 2023-12-15 ENCOUNTER — Encounter: Payer: Self-pay | Admitting: Family

## 2023-12-15 LAB — MICROALBUMIN / CREATININE URINE RATIO
Creatinine, Urine: 51 mg/dL
Microalb Creat Ratio: 218 mg/g{creat} — ABNORMAL HIGH (ref 0–29)
Microalb, Ur: 111.3 ug/mL — ABNORMAL HIGH

## 2023-12-17 ENCOUNTER — Encounter: Payer: Self-pay | Admitting: Family

## 2023-12-17 ENCOUNTER — Other Ambulatory Visit
Admission: RE | Admit: 2023-12-17 | Discharge: 2023-12-17 | Disposition: A | Discharge status: Home / Self Care | Attending: Family | Admitting: Family

## 2023-12-17 ENCOUNTER — Telehealth: Admitting: Family

## 2023-12-17 ENCOUNTER — Other Ambulatory Visit (HOSPITAL_COMMUNITY)
Admission: RE | Admit: 2023-12-17 | Discharge: 2023-12-17 | Disposition: A | Discharge status: Home / Self Care | Source: Ambulatory Visit | Attending: Family | Admitting: Family

## 2023-12-17 VITALS — BP 118/72 | Ht 65.5 in | Wt 160.2 lb

## 2023-12-17 DIAGNOSIS — Z113 Encounter for screening for infections with a predominantly sexual mode of transmission: Secondary | ICD-10-CM | POA: Diagnosis present

## 2023-12-17 DIAGNOSIS — R809 Proteinuria, unspecified: Secondary | ICD-10-CM

## 2023-12-17 DIAGNOSIS — N760 Acute vaginitis: Secondary | ICD-10-CM | POA: Insufficient documentation

## 2023-12-17 DIAGNOSIS — B9689 Other specified bacterial agents as the cause of diseases classified elsewhere: Secondary | ICD-10-CM | POA: Insufficient documentation

## 2023-12-17 DIAGNOSIS — E119 Type 2 diabetes mellitus without complications: Secondary | ICD-10-CM | POA: Diagnosis not present

## 2023-12-17 DIAGNOSIS — Z7984 Long term (current) use of oral hypoglycemic drugs: Secondary | ICD-10-CM | POA: Diagnosis not present

## 2023-12-17 LAB — URINALYSIS, MICROSCOPIC (REFLEX)

## 2023-12-17 LAB — URINALYSIS, ROUTINE W REFLEX MICROSCOPIC
Bilirubin Urine: NEGATIVE
Glucose, UA: >=500 mg/dL — AB
Ketones, ur: NEGATIVE mg/dL
Leukocytes,Ua: NEGATIVE
Nitrite: NEGATIVE
Protein, ur: 30 mg/dL — AB
Specific Gravity, Urine: 1.01 (ref 1.005–1.030)
pH: 5.5 (ref 5.0–8.0)

## 2023-12-17 NOTE — Assessment & Plan Note (Signed)
 Subacute vaginal itching and dysuria last week.  Pending urine and vaginal swab collected today Will increase jardiance  if negative for yeast, urinary tract infection

## 2023-12-17 NOTE — Assessment & Plan Note (Signed)
 Chronic, suboptimal control.  Increase Jardiance  25 mg pending labs.  Continue losartan  25 mg daily

## 2023-12-17 NOTE — Progress Notes (Signed)
 Virtual Visit via Video Note  I connected with Alyssa Schultz on 12/17/23 at  9:00 AM EDT by a video enabled telemedicine application and verified that I am speaking with the correct person using two identifiers. Location patient: home Location provider: work  Persons participating in the virtual visit: patient, provider  I discussed the limitations of evaluation and management by telemedicine and the availability of in person appointments. The patient expressed understanding and agreed to proceed.  HPI:  She is currently moving and endorses dietary indiscretion.   She had low grade Fever 98.47F and episodic dysuria  Evisit for suspected yeast vaginitis, resolved after diflucan .   Denies hematuria  She is compliant with jardiance  10mg .   She is not taking metformin   Us  renal 12/2019 medical renal disease  She is feeling well on losartan  25mg .   ROS: See pertinent positives and negatives per HPI.  EXAM:  VITALS per patient if applicable: BP 118/72   Ht 5' 5.5" (1.664 m)   Wt 160 lb 3.2 oz (72.7 kg)   LMP 07/24/2007 Comment: ablation in 2008-pt reports she went through menopause after this  BMI 26.25 kg/m  BP Readings from Last 3 Encounters:  12/17/23 118/72  11/12/23 120/72  09/30/23 120/78   Wt Readings from Last 3 Encounters:  12/17/23 160 lb 3.2 oz (72.7 kg)  11/12/23 168 lb (76.2 kg)  09/30/23 167 lb 8 oz (76 kg)    GENERAL: alert, oriented, appears well and in no acute distress  HEENT: atraumatic, conjunttiva clear, no obvious abnormalities on inspection of external nose and ears  NECK: normal movements of the head and neck  LUNGS: on inspection no signs of respiratory distress, breathing rate appears normal, no obvious gross SOB, gasping or wheezing  CV: no obvious cyanosis  MS: moves all visible extremities without noticeable abnormality  PSYCH/NEURO: pleasant and cooperative, no obvious depression or anxiety, speech and thought processing grossly  intact  ASSESSMENT AND PLAN: Controlled type 2 diabetes mellitus without complication, without long-term current use of insulin  (HCC) Assessment & Plan: Chronic, stable. She is no longer on metformin . Pending labs, will increase jardiance  to 25mg .    Acute vaginitis Assessment & Plan: Subacute vaginal itching and dysuria last week.  Pending urine and vaginal swab collected today Will increase jardiance  if negative for yeast, urinary tract infection  Orders: -     Urinalysis, Routine w reflex microscopic -     Urine Culture -     Cervicovaginal ancillary only  Proteinuria, unspecified type Assessment & Plan: Chronic, suboptimal control.  Increase Jardiance  25 mg pending labs.  Continue losartan  25 mg daily      -we discussed possible serious and likely etiologies, options for evaluation and workup, limitations of telemedicine visit vs in person visit, treatment, treatment risks and precautions. Pt prefers to treat via telemedicine empirically rather then risking or undertaking an in person visit at this moment.    I discussed the assessment and treatment plan with the patient. The patient was provided an opportunity to ask questions and all were answered. The patient agreed with the plan and demonstrated an understanding of the instructions.   The patient was advised to call back or seek an in-person evaluation if the symptoms worsen or if the condition fails to improve as anticipated.  Advised if desired AVS can be mailed or viewed via MyChart if Mychart user.   Bascom Bossier, FNP

## 2023-12-17 NOTE — Patient Instructions (Signed)
 Please have urine and vaginal swab collected today  Will increase jardiance  if negative for yeast, urinary tract infection  Nice to see you!

## 2023-12-17 NOTE — Assessment & Plan Note (Addendum)
 Chronic, stable. She is no longer on metformin . Pending labs, will increase jardiance  to 25mg .

## 2023-12-18 ENCOUNTER — Encounter: Payer: Self-pay | Admitting: Family

## 2023-12-18 LAB — URINE CULTURE: Culture: <10000 — AB

## 2023-12-19 ENCOUNTER — Other Ambulatory Visit: Payer: Self-pay | Admitting: Family

## 2023-12-19 DIAGNOSIS — R899 Unspecified abnormal finding in specimens from other organs, systems and tissues: Secondary | ICD-10-CM

## 2023-12-20 ENCOUNTER — Encounter: Payer: Self-pay | Admitting: Family

## 2023-12-20 LAB — CERVICOVAGINAL ANCILLARY ONLY
Bacterial Vaginitis (gardnerella): NEGATIVE
Candida Glabrata: NEGATIVE
Candida Vaginitis: NEGATIVE
Chlamydia: NEGATIVE
Comment: NEGATIVE
Comment: NEGATIVE
Comment: NEGATIVE
Comment: NEGATIVE
Comment: NEGATIVE
Comment: NORMAL
Neisseria Gonorrhea: NEGATIVE
Trichomonas: NEGATIVE

## 2023-12-21 ENCOUNTER — Encounter: Payer: Self-pay | Admitting: Family

## 2023-12-29 ENCOUNTER — Other Ambulatory Visit: Payer: Self-pay

## 2023-12-29 ENCOUNTER — Other Ambulatory Visit
Admission: RE | Admit: 2023-12-29 | Discharge: 2023-12-29 | Disposition: A | Discharge status: Home / Self Care | Attending: Family | Admitting: Family

## 2023-12-29 ENCOUNTER — Encounter: Payer: Self-pay | Admitting: Family

## 2023-12-29 DIAGNOSIS — R8290 Unspecified abnormal findings in urine: Secondary | ICD-10-CM | POA: Diagnosis not present

## 2023-12-29 LAB — URINALYSIS, COMPLETE (UACMP) WITH MICROSCOPIC
Bacteria, UA: NONE SEEN
Bilirubin Urine: NEGATIVE
Glucose, UA: >=500 mg/dL — AB
Ketones, ur: NEGATIVE mg/dL
Leukocytes,Ua: NEGATIVE
Nitrite: NEGATIVE
Protein, ur: 30 mg/dL — AB
Specific Gravity, Urine: 1.01 (ref 1.005–1.030)
WBC, UA: NONE SEEN WBC/hpf (ref 0–5)
pH: 5.5 (ref 5.0–8.0)

## 2023-12-30 ENCOUNTER — Ambulatory Visit: Payer: Self-pay | Admitting: Family

## 2023-12-30 LAB — URINE CULTURE: Culture: <10000 — AB

## 2023-12-31 ENCOUNTER — Encounter: Payer: Self-pay | Admitting: Family

## 2023-12-31 ENCOUNTER — Telehealth: Admitting: Emergency Medicine

## 2023-12-31 ENCOUNTER — Ambulatory Visit: Payer: Self-pay

## 2023-12-31 ENCOUNTER — Encounter: Payer: Self-pay | Admitting: Podiatry

## 2023-12-31 DIAGNOSIS — L603 Nail dystrophy: Secondary | ICD-10-CM

## 2023-12-31 NOTE — Telephone Encounter (Signed)
  Chief Complaint: toe injury Symptoms: left big toenail injury Frequency: injured three weeks ago Pertinent Negatives: Patient denies drainage or swelling Disposition: [] ED /[] Urgent Care (no appt availability in office) / [] Appointment(In office/virtual)/ []  [place] [facility]/ [] Home Care/ [] Refused Recommended Disposition /[] [place] [facility]/ [x]  Follow-up with PCP Additional Notes: patient with known diabetes concerned for left big toenail injury.  Injured three weeks ago. Currently using home care, keeping clean and dry.  Advised that there are no office visits available today. 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    Reason for Disposition  [1] Diabetes mellitus AND [2] small cut (scratch) or abrasion (scrape)  Answer Assessment - Initial Assessment Questions 1. MECHANISM: "How did the injury happen?"      Toe nail split down to meat 2. ONSET: "When did the injury happen?" (Minutes or hours ago)      Three weeks ago 3. LOCATION: "What part of the toe is injured?" "Is the nail damaged?"      Left big toe  4. APPEARANCE of TOE INJURY: "What does the injury look like?"      redness 5. SEVERITY: "Can you use the foot normally?" "Can you walk?"      moderate 6. SIZE: For cuts, bruises, or swelling, ask: "How large is it?" (e.g., inches or centimeters;  entire toe)      Toe nail injured "down to meat" 7. PAIN: "Is there pain?" If Yes, ask: "How bad is the pain?"   (e.g., Scale 1-10; or mild, moderate, severe)     Mildly painful 8. TETANUS: For any breaks in the skin, ask: "When was the last tetanus booster?"     Up to date 9. DIABETES: "Do you have a history of diabetes or poor circulation in the feet?"     yes 10. OTHER SYMPTOMS: "Do you have any other symptoms?"        no  Protocols used: Toe Injury-A-AH

## 2023-12-31 NOTE — Progress Notes (Signed)
 Because you have diabetes with a split toenail and not athlete's foot, I feel your condition warrants further evaluation and I recommend that you be seen for a face to face visit.     If you have a podiatrist (foot doctor), please be seen there. [facility] has several locations if you need to find a podiatrist:  [email or web address]   Or you can contact your primary care physician practice to be seen.  I am sorry I am not able to help you through an Evisit   NOTE: You will NOT be charged for this eVisit.  If you do not have a PCP, [facility] offers a free physician referral service available at [phone]. Our trained staff has the experience, knowledge and resources to put you in touch with a physician who is right for you.    If you are having a true medical emergency please call 911.   Your e-visit answers were reviewed by a board certified advanced clinical practitioner to complete your personal care plan.  Thank you for using e-Visits.

## 2024-01-03 NOTE — Telephone Encounter (Signed)
 Noted

## 2024-01-03 NOTE — Telephone Encounter (Signed)
 Seen e visit

## 2024-01-04 ENCOUNTER — Other Ambulatory Visit: Admitting: Urology

## 2024-01-04 ENCOUNTER — Other Ambulatory Visit: Payer: Self-pay

## 2024-01-06 ENCOUNTER — Other Ambulatory Visit: Payer: Self-pay

## 2024-01-12 ENCOUNTER — Other Ambulatory Visit: Admitting: Urology

## 2024-01-12 ENCOUNTER — Ambulatory Visit: Admitting: Podiatry

## 2024-01-19 ENCOUNTER — Ambulatory Visit: Admitting: Urology

## 2024-01-19 ENCOUNTER — Encounter: Payer: Self-pay | Admitting: Urology

## 2024-01-19 VITALS — BP 111/67 | Ht 65.5 in | Wt 160.0 lb

## 2024-01-19 DIAGNOSIS — R310 Gross hematuria: Secondary | ICD-10-CM | POA: Diagnosis not present

## 2024-01-19 MED ORDER — LIDOCAINE HCL URETHRAL/MUCOSAL 2 % EX GEL
1.0000 | Freq: Once | CUTANEOUS | Status: AC
  Administered 2024-01-19: 1 via URETHRAL

## 2024-01-19 MED ORDER — SULFAMETHOXAZOLE-TRIMETHOPRIM 800-160 MG PO TABS
1.0000 | ORAL_TABLET | Freq: Once | ORAL | Status: AC
  Administered 2024-01-19: 1 via ORAL

## 2024-01-19 NOTE — Progress Notes (Signed)
 Cystoscopy Procedure Note:  Indication: Asymptomatic microscopic hematuria  Bactrim  was given for prophylaxis  After informed consent and discussion of the procedure and its risks, STACIA FEAZELL was positioned and prepped in the standard fashion. Cystoscopy was performed with a flexible cystoscope. The urethra, bladder neck and entire bladder was visualized in a standard fashion.  The ureteral orifices were visualized in their normal location and orientation.  Bladder mucosa grossly normal throughout, no abnormalities on retroflexion  Imaging: CT urogram with no suspicious findings, stable right extrarenal pelvis, no filling defects  Findings: Normal cystoscopy  Assessment and Plan: Follow-up with urology as needed, reassurance provided regarding history of intermittent benign microscopic hematuria  Jay Meth, MD 01/19/2024

## 2024-01-25 ENCOUNTER — Other Ambulatory Visit (HOSPITAL_COMMUNITY): Payer: Self-pay

## 2024-02-01 ENCOUNTER — Other Ambulatory Visit: Payer: Self-pay

## 2024-02-01 ENCOUNTER — Other Ambulatory Visit (HOSPITAL_COMMUNITY): Payer: Self-pay

## 2024-02-03 ENCOUNTER — Other Ambulatory Visit: Payer: Self-pay

## 2024-02-23 ENCOUNTER — Institutional Professional Consult (permissible substitution): Admitting: Plastic Surgery

## 2024-02-28 ENCOUNTER — Encounter: Payer: Self-pay | Admitting: Family

## 2024-03-02 ENCOUNTER — Other Ambulatory Visit: Payer: Self-pay

## 2024-03-03 ENCOUNTER — Other Ambulatory Visit: Payer: Self-pay

## 2024-03-03 NOTE — Telephone Encounter (Signed)
 Previously refilled by Dr lateef. Ok to refill

## 2024-03-05 ENCOUNTER — Other Ambulatory Visit: Payer: Self-pay

## 2024-03-05 ENCOUNTER — Other Ambulatory Visit: Payer: Self-pay | Admitting: Family

## 2024-03-05 MED ORDER — LOSARTAN POTASSIUM 25 MG PO TABS
ORAL_TABLET | ORAL | 3 refills | Status: AC
  Filled 2024-03-05: qty 90, 90d supply, fill #0
  Filled 2024-06-02: qty 90, 90d supply, fill #1
  Filled 2024-09-04: qty 90, 90d supply, fill #2

## 2024-03-06 ENCOUNTER — Other Ambulatory Visit: Payer: Self-pay

## 2024-03-06 MED ORDER — LOSARTAN POTASSIUM 25 MG PO TABS
25.0000 mg | ORAL_TABLET | Freq: Every day | ORAL | 11 refills | Status: DC
  Filled 2024-03-06: qty 30, 30d supply, fill #0

## 2024-03-20 ENCOUNTER — Telehealth: Admitting: Physician Assistant

## 2024-03-20 ENCOUNTER — Encounter: Payer: Self-pay | Admitting: Family

## 2024-03-20 ENCOUNTER — Other Ambulatory Visit: Payer: Self-pay

## 2024-03-20 DIAGNOSIS — B3731 Acute candidiasis of vulva and vagina: Secondary | ICD-10-CM | POA: Diagnosis not present

## 2024-03-20 DIAGNOSIS — R3989 Other symptoms and signs involving the genitourinary system: Secondary | ICD-10-CM | POA: Diagnosis not present

## 2024-03-20 DIAGNOSIS — E119 Type 2 diabetes mellitus without complications: Secondary | ICD-10-CM

## 2024-03-20 MED ORDER — CEPHALEXIN 500 MG PO CAPS
500.0000 mg | ORAL_CAPSULE | Freq: Two times a day (BID) | ORAL | 0 refills | Status: DC
  Filled 2024-03-20: qty 14, 7d supply, fill #0

## 2024-03-20 MED ORDER — FLUCONAZOLE 150 MG PO TABS
150.0000 mg | ORAL_TABLET | ORAL | 0 refills | Status: DC | PRN
  Filled 2024-03-20: qty 2, 6d supply, fill #0

## 2024-03-20 NOTE — Progress Notes (Signed)
 E-Visit for Urinary Problems  We are sorry that you are not feeling well.  Here is how we plan to help!  Based on what you shared with me it looks like you most likely have a simple urinary tract infection.  A UTI (Urinary Tract Infection) is a bacterial infection of the bladder.  Most cases of urinary tract infections are simple to treat but a key part of your care is to encourage you to drink plenty of fluids and watch your symptoms carefully.  I have prescribed Keflex  500 mg twice a day for 7 days.  Your symptoms should gradually improve. Call us  if the burning in your urine worsens, you develop worsening fever, back pain or pelvic pain or if your symptoms do not resolve after completing the antibiotic.  Diflucan  given for potential yeast vaginitis.  Urinary tract infections can be prevented by drinking plenty of water to keep your body hydrated.  Also be sure when you wipe, wipe from front to back and don't hold it in!  If possible, empty your bladder every 4 hours.  HOME CARE Drink plenty of fluids Compete the full course of the antibiotics even if the symptoms resolve Remember, when you need to go.go. Holding in your urine can increase the likelihood of getting a UTI! GET HELP RIGHT AWAY IF: You cannot urinate You get a high fever Worsening back pain occurs You see blood in your urine You feel sick to your stomach or throw up You feel like you are going to pass out  MAKE SURE YOU  Understand these instructions. Will watch your condition. Will get help right away if you are not doing well or get worse.   Thank you for choosing an e-visit.  Your e-visit answers were reviewed by a board certified advanced clinical practitioner to complete your personal care plan. Depending upon the condition, your plan could have included both over the counter or prescription medications.  Please review your pharmacy choice. Make sure the pharmacy is open so you can pick up prescription now.  If there is a problem, you may contact your provider through [employer] and have the prescription routed to another pharmacy.  Your safety is important to us . If you have drug allergies check your prescription carefully.   For the next 24 hours you can use MyChart to ask questions about today's visit, request a non-urgent call back, or ask for a work or school excuse. You will get an email in the next two days asking about your experience. I hope that your e-visit has been valuable and will speed your recovery.   I have spent 5 minutes in review of e-visit questionnaire, review and updating patient chart, medical decision making and response to patient.   Delon CHRISTELLA Dickinson, PA-C

## 2024-03-21 ENCOUNTER — Other Ambulatory Visit: Payer: Self-pay

## 2024-03-21 ENCOUNTER — Encounter: Payer: Self-pay | Admitting: Family

## 2024-03-21 DIAGNOSIS — E119 Type 2 diabetes mellitus without complications: Secondary | ICD-10-CM

## 2024-03-22 ENCOUNTER — Other Ambulatory Visit
Admission: RE | Admit: 2024-03-22 | Discharge: 2024-03-22 | Disposition: A | Discharge status: Home / Self Care | Attending: Family | Admitting: Family

## 2024-03-22 DIAGNOSIS — E119 Type 2 diabetes mellitus without complications: Secondary | ICD-10-CM | POA: Diagnosis not present

## 2024-03-22 NOTE — Addendum Note (Signed)
 Addended by: Sheelah Ritacco on: 03/22/2024 08:08 AM   Modules accepted: Orders

## 2024-03-23 ENCOUNTER — Institutional Professional Consult (permissible substitution): Admitting: Plastic Surgery

## 2024-03-23 LAB — MICROALBUMIN / CREATININE URINE RATIO
Creatinine, Urine: 38 mg/dL
Microalb Creat Ratio: 23 mg/g{creat} (ref 0–29)
Microalb, Ur: 8.8 ug/mL — ABNORMAL HIGH

## 2024-03-23 LAB — HEMOGLOBIN A1C
Hgb A1c MFr Bld: 6.8 % — ABNORMAL HIGH (ref 4.8–5.6)
Mean Plasma Glucose: 148 mg/dL

## 2024-03-24 ENCOUNTER — Other Ambulatory Visit: Payer: Self-pay

## 2024-03-24 ENCOUNTER — Telehealth: Admitting: Family

## 2024-03-24 ENCOUNTER — Encounter: Payer: Self-pay | Admitting: Family

## 2024-03-24 ENCOUNTER — Telehealth: Payer: Self-pay

## 2024-03-24 VITALS — BP 118/78 | HR 85 | Ht 65.0 in | Wt 166.1 lb

## 2024-03-24 DIAGNOSIS — E119 Type 2 diabetes mellitus without complications: Secondary | ICD-10-CM | POA: Diagnosis not present

## 2024-03-24 DIAGNOSIS — N898 Other specified noninflammatory disorders of vagina: Secondary | ICD-10-CM | POA: Diagnosis not present

## 2024-03-24 DIAGNOSIS — B372 Candidiasis of skin and nail: Secondary | ICD-10-CM | POA: Diagnosis not present

## 2024-03-24 MED ORDER — NYSTATIN 100000 UNIT/GM EX CREA
1.0000 | TOPICAL_CREAM | Freq: Two times a day (BID) | CUTANEOUS | 1 refills | Status: AC
  Filled 2024-03-24: qty 30, 15d supply, fill #0

## 2024-03-24 MED ORDER — METRONIDAZOLE 0.75 % VA GEL
1.0000 | Freq: Every day | VAGINAL | 0 refills | Status: AC
  Filled 2024-03-24: qty 70, 7d supply, fill #0

## 2024-03-24 MED ORDER — METRONIDAZOLE 0.75 % EX GEL
1.0000 | Freq: Every day | CUTANEOUS | 0 refills | Status: AC
Start: 2024-03-24 — End: 2024-03-29
  Filled 2024-03-24: qty 45, 5d supply, fill #0

## 2024-03-24 NOTE — Telephone Encounter (Signed)
 Copied from CRM [id number]. Topic: Clinical - Prescription Issue >> Mar 24, 2024  1:38 PM Alyssa Schultz wrote: Reason for CRM: Patient is calling in stating the pharmacy gave her the wrong prescription because they need the metroNIDAZOLE  (METROGEL ) 0.75 % gel [[id number]] changed, due to it stating to apply to the infected area, but she stated it should be inserted in. Please advise pharmacy.

## 2024-03-24 NOTE — Telephone Encounter (Signed)
 Spoke to pt informed her that Rollene was gone for the afternoon but I did send message to her and as soon as she responds we will get rx corrected

## 2024-03-24 NOTE — Telephone Encounter (Unsigned)
 Copied from CRM [id number]. Topic: Clinical - Prescription Issue >> Mar 24, 2024  1:38 PM Chiquita SQUIBB wrote: Reason for CRM: Patient is calling in stating the pharmacy gave her the wrong prescription because they need the metroNIDAZOLE  (METROGEL ) 0.75 % gel [[id number]] changed, due to it stating to apply to the infected area, but she stated it should be inserted in. Please advise pharmacy.

## 2024-03-24 NOTE — Progress Notes (Signed)
 Virtual Visit via Video Note  I connected with Collie R Sianez on 03/27/24 at 11:00 AM EDT by a video enabled telemedicine application and verified that I am speaking with the correct person using two identifiers. Location patient: home Location provider: work  Persons participating in the virtual visit: patient, provider  I discussed the limitations of evaluation and management by telemedicine and the availability of in person appointments. The patient expressed understanding and agreed to proceed.  HPI: Discussed the use of AI scribe software for clinical note transcription with the patient, who gave verbal consent to proceed.  History of Present Illness   Alyssa Schultz is a 58 year old female with recurrent urinary tract infections who presents for follow-up on her kidney function and UTI management.  She has a history of recurrent urinary tract infections and is currently on Jardiance  10 mg, which has significantly lowered her urine protein levels. Her microalbumin creatinine ratio decreased from 329 in March to 218. She has experienced symptoms of UTIs and yeast infections, treated with antibiotics and antifungals, via an evisit 03/20/24. Currently on keflex  and given diflucan .   She complains of persistent vaginal odor and concern for BV.  Endorses dietary indiscretion of late.   She request a refill of nystatin  to use under her breasts.       ROS: See pertinent positives and negatives per HPI.  EXAM:  VITALS per patient if applicable: BP 118/78   Pulse 85   Ht 5' 5 (1.651 m)   Wt 166 lb 1.6 oz (75.3 kg)   LMP 07/24/2007 Comment: ablation in 2008-pt reports she went through menopause after this  BMI 27.64 kg/m  BP Readings from Last 3 Encounters:  03/24/24 118/78  01/19/24 111/67  12/17/23 118/72   Wt Readings from Last 3 Encounters:  03/24/24 166 lb 1.6 oz (75.3 kg)  01/19/24 160 lb (72.6 kg)  12/17/23 160 lb 3.2 oz (72.7 kg)    GENERAL: alert, oriented, appears  well and in no acute distress  HEENT: atraumatic, conjunttiva clear, no obvious abnormalities on inspection of external nose and ears  NECK: normal movements of the head and neck  LUNGS: on inspection no signs of respiratory distress, breathing rate appears normal, no obvious gross SOB, gasping or wheezing  CV: no obvious cyanosis  MS: moves all visible extremities without noticeable abnormality  PSYCH/NEURO: pleasant and cooperative, no obvious depression or anxiety, speech and thought processing grossly intact  ASSESSMENT AND PLAN: Vaginal odor Assessment & Plan:   Previous urine culture showed insignificant growth 12/29/23, 12/17/23, 09/17/23 , 05/21/23. Emphasized the need for documented cultures to prevent antibiotic resistance. Discussed cranberry supplements and D-mannose for prevention.  Suspected bacterial vaginosis and we discussed metronidazole  use and side effects while she completes keflex .  Advised pelvic exam if symptoms persist .  Orders: -     metroNIDAZOLE ; Apply 1 Application topically daily for 5 days.  Dispense: 50 g; Refill: 0 -     Urine Culture; Future -     Urinalysis, Routine w reflex microscopic; Future -     Cervicovaginal ancillary only; Future  Candidal intertrigo -     Nystatin ; Apply 1 Application topically 2 (two) times daily.  Dispense: 30 g; Refill: 1 -     Urine Culture; Future -     Urinalysis, Routine w reflex microscopic; Future  Controlled type 2 diabetes mellitus without complication, without long-term current use of insulin  [facility]) Assessment & Plan: Chronic, stable. Diabetes is well-managed with Jardiance ,  and the microalbumin creatinine ratio has normalized. Jardiance  is tolerated well despite a history of suspected UTI and yeast infection. Of note, she will confirm Jardiance  dose and let me know if 10mg  or 25mg .    Orders: -     Microalbumin / creatinine urine ratio; Future -     Comprehensive metabolic panel with GFR; Future -      Hemoglobin A1c; Future     -we discussed possible serious and likely etiologies, options for evaluation and workup, limitations of telemedicine visit vs in person visit, treatment, treatment risks and precautions. Pt prefers to treat via telemedicine empirically rather then risking or undertaking an in person visit at this moment.    I discussed the assessment and treatment plan with the patient. The patient was provided an opportunity to ask questions and all were answered. The patient agreed with the plan and demonstrated an understanding of the instructions.   The patient was advised to call back or seek an in-person evaluation if the symptoms worsen or if the condition fails to improve as anticipated.  Advised if desired AVS can be mailed or viewed via MyChart if Mychart user.   [name], FNP

## 2024-03-27 NOTE — Patient Instructions (Signed)
 Please see assessment and plan in my note.

## 2024-03-27 NOTE — Assessment & Plan Note (Signed)
 Previous urine culture showed insignificant growth 12/29/23, 12/17/23, 09/17/23 , 05/21/23. Emphasized the need for documented cultures to prevent antibiotic resistance. Discussed cranberry supplements and D-mannose for prevention.  Suspected bacterial vaginosis and we discussed metronidazole  use and side effects while she completes keflex .  Advised pelvic exam if symptoms persist .

## 2024-03-27 NOTE — Assessment & Plan Note (Signed)
 Chronic, stable. Diabetes is well-managed with Jardiance , and the microalbumin creatinine ratio has normalized. Jardiance  is tolerated well despite a history of suspected UTI and yeast infection. Of note, she will confirm Jardiance  dose and let me know if 10mg  or 25mg .

## 2024-04-03 DIAGNOSIS — R809 Proteinuria, unspecified: Secondary | ICD-10-CM | POA: Diagnosis not present

## 2024-04-03 DIAGNOSIS — E7800 Pure hypercholesterolemia, unspecified: Secondary | ICD-10-CM | POA: Diagnosis not present

## 2024-04-03 DIAGNOSIS — E1129 Type 2 diabetes mellitus with other diabetic kidney complication: Secondary | ICD-10-CM | POA: Diagnosis not present

## 2024-04-07 ENCOUNTER — Other Ambulatory Visit: Payer: Self-pay

## 2024-04-10 ENCOUNTER — Encounter: Payer: Self-pay | Admitting: Family

## 2024-04-18 ENCOUNTER — Other Ambulatory Visit: Payer: Self-pay | Admitting: Family

## 2024-04-18 DIAGNOSIS — Z1231 Encounter for screening mammogram for malignant neoplasm of breast: Secondary | ICD-10-CM

## 2024-04-20 ENCOUNTER — Encounter: Payer: Self-pay | Admitting: Family

## 2024-04-20 NOTE — Telephone Encounter (Signed)
 Noted

## 2024-05-02 ENCOUNTER — Other Ambulatory Visit: Payer: Self-pay

## 2024-05-02 ENCOUNTER — Other Ambulatory Visit: Payer: Self-pay | Admitting: Family

## 2024-05-02 DIAGNOSIS — I700 Atherosclerosis of aorta: Secondary | ICD-10-CM

## 2024-05-02 DIAGNOSIS — E785 Hyperlipidemia, unspecified: Secondary | ICD-10-CM

## 2024-05-03 ENCOUNTER — Other Ambulatory Visit: Payer: Self-pay

## 2024-05-03 MED ORDER — ROSUVASTATIN CALCIUM 40 MG PO TABS
40.0000 mg | ORAL_TABLET | Freq: Every day | ORAL | 3 refills | Status: AC
  Filled 2024-05-03: qty 90, 90d supply, fill #0
  Filled 2024-07-28: qty 90, 90d supply, fill #1

## 2024-05-10 ENCOUNTER — Encounter: Payer: Self-pay | Admitting: Family

## 2024-05-23 ENCOUNTER — Other Ambulatory Visit: Payer: Self-pay

## 2024-06-02 ENCOUNTER — Other Ambulatory Visit: Payer: Self-pay

## 2024-06-05 ENCOUNTER — Ambulatory Visit
Admission: RE | Admit: 2024-06-05 | Discharge: 2024-06-05 | Disposition: A | Discharge status: Home / Self Care | Source: Ambulatory Visit | Attending: Family | Admitting: Family

## 2024-06-05 DIAGNOSIS — Z1231 Encounter for screening mammogram for malignant neoplasm of breast: Secondary | ICD-10-CM | POA: Diagnosis not present

## 2024-06-15 ENCOUNTER — Ambulatory Visit: Payer: Self-pay | Admitting: Family

## 2024-06-15 ENCOUNTER — Ambulatory Visit: Payer: Self-pay

## 2024-06-15 ENCOUNTER — Other Ambulatory Visit: Payer: Self-pay

## 2024-06-15 ENCOUNTER — Other Ambulatory Visit
Admission: RE | Admit: 2024-06-15 | Discharge: 2024-06-15 | Disposition: A | Discharge status: Home / Self Care | Attending: Family | Admitting: Family

## 2024-06-15 ENCOUNTER — Other Ambulatory Visit: Payer: Self-pay | Admitting: Family

## 2024-06-15 ENCOUNTER — Ambulatory Visit: Admitting: Family

## 2024-06-15 ENCOUNTER — Encounter: Payer: Self-pay | Admitting: Family

## 2024-06-15 DIAGNOSIS — N898 Other specified noninflammatory disorders of vagina: Secondary | ICD-10-CM | POA: Insufficient documentation

## 2024-06-15 DIAGNOSIS — B372 Candidiasis of skin and nail: Secondary | ICD-10-CM | POA: Diagnosis not present

## 2024-06-15 DIAGNOSIS — R8290 Unspecified abnormal findings in urine: Secondary | ICD-10-CM

## 2024-06-15 DIAGNOSIS — N3001 Acute cystitis with hematuria: Secondary | ICD-10-CM

## 2024-06-15 DIAGNOSIS — B3731 Acute candidiasis of vulva and vagina: Secondary | ICD-10-CM

## 2024-06-15 LAB — URINALYSIS, ROUTINE W REFLEX MICROSCOPIC
Bilirubin Urine: NEGATIVE
Glucose, UA: >=500 mg/dL — AB
Ketones, ur: NEGATIVE mg/dL
Nitrite: NEGATIVE
Specific Gravity, Urine: 1.015 (ref 1.005–1.030)
pH: 5.5 (ref 5.0–8.0)

## 2024-06-15 LAB — URINALYSIS, MICROSCOPIC (REFLEX): WBC, UA: >50 WBC/hpf (ref 0–5)

## 2024-06-15 MED ORDER — FLUCONAZOLE 150 MG PO TABS
150.0000 mg | ORAL_TABLET | Freq: Once | ORAL | 0 refills | Status: AC
  Filled 2024-06-15 (×2): qty 1, 1d supply, fill #0

## 2024-06-15 MED ORDER — NITROFURANTOIN MONOHYD MACRO 100 MG PO CAPS
100.0000 mg | ORAL_CAPSULE | Freq: Two times a day (BID) | ORAL | 0 refills | Status: DC
  Filled 2024-06-15: qty 10, 5d supply, fill #0

## 2024-06-15 NOTE — Telephone Encounter (Signed)
 Spoke to pt regarding message below she has responded via phone and via my chart

## 2024-06-15 NOTE — Telephone Encounter (Signed)
 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

## 2024-06-15 NOTE — Telephone Encounter (Signed)
 Answer Assessment - Initial Assessment Questions 1. REASON FOR CALL: What is the main reason for your call? or How can I best help you?   Patient called back and reports missed call from the clinic. Patient request UA results and medication. Informed patient MD has not given interpretation/recommendations at this time and will forward message to office.  Protocols used: Information Only Call - No Triage-A-AH

## 2024-06-15 NOTE — Telephone Encounter (Signed)
 Spoke to pt informed her that Macrobid  has been sent in to pharmacy, she states that she will need something for a yeast infection as well due to abx

## 2024-06-16 NOTE — Telephone Encounter (Signed)
 Copied from CRM #[id number]. 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 Please contact patient or send note through Mychart

## 2024-06-16 NOTE — Telephone Encounter (Signed)
 Please advise. Is it okay to write a frequent restroom break excuse work note?

## 2024-06-17 ENCOUNTER — Other Ambulatory Visit: Payer: Self-pay

## 2024-06-18 LAB — URINE CULTURE: Culture: 60000 — AB

## 2024-06-19 ENCOUNTER — Other Ambulatory Visit: Payer: Self-pay

## 2024-06-19 ENCOUNTER — Telehealth: Payer: Self-pay

## 2024-06-19 DIAGNOSIS — N958 Other specified menopausal and perimenopausal disorders: Secondary | ICD-10-CM

## 2024-06-19 DIAGNOSIS — B3731 Acute candidiasis of vulva and vagina: Secondary | ICD-10-CM

## 2024-06-19 MED ORDER — PREMARIN 0.625 MG/GM VA CREA
TOPICAL_CREAM | VAGINAL | 12 refills | Status: AC
  Filled 2024-06-19: qty 60, 90d supply, fill #0

## 2024-06-19 MED ORDER — FLUCONAZOLE 150 MG PO TABS
150.0000 mg | ORAL_TABLET | ORAL | 0 refills | Status: AC | PRN
  Filled 2024-06-19: qty 2, 6d supply, fill #0

## 2024-06-19 NOTE — Telephone Encounter (Signed)
 Pt drops by the office and stated that she was recently treated for a UTI w/gross hematuria by her PCP but is still having UTI symptoms (dysuria, pelvic pressure) she questions if she can have an extension of her antibiotics or change to a different antibiotic.  Pt questions what she can do to prevent further UTIs, advised pt on cranberry tablets and D-mannose. Also advised pt to restart estrogen cream, RX sent. Also sent diflucan  for yeast infection secondary to antibiotics.

## 2024-06-20 ENCOUNTER — Other Ambulatory Visit: Payer: Self-pay

## 2024-06-20 ENCOUNTER — Other Ambulatory Visit
Admission: RE | Admit: 2024-06-20 | Discharge: 2024-06-20 | Disposition: A | Discharge status: Home / Self Care | Attending: Family | Admitting: Family

## 2024-06-20 ENCOUNTER — Other Ambulatory Visit: Payer: Self-pay | Admitting: Family

## 2024-06-20 DIAGNOSIS — R8290 Unspecified abnormal findings in urine: Secondary | ICD-10-CM | POA: Insufficient documentation

## 2024-06-20 DIAGNOSIS — R300 Dysuria: Secondary | ICD-10-CM

## 2024-06-20 LAB — URINALYSIS, ROUTINE W REFLEX MICROSCOPIC
Bacteria, UA: NONE SEEN
Bilirubin Urine: NEGATIVE
Glucose, UA: >=500 mg/dL — AB
Hgb urine dipstick: NEGATIVE
Ketones, ur: NEGATIVE mg/dL
Leukocytes,Ua: NEGATIVE
Nitrite: NEGATIVE
Protein, ur: NEGATIVE mg/dL
Specific Gravity, Urine: 1.014 (ref 1.005–1.030)
pH: 5 (ref 5.0–8.0)

## 2024-06-20 MED ORDER — PHENAZOPYRIDINE HCL 100 MG PO TABS
100.0000 mg | ORAL_TABLET | Freq: Three times a day (TID) | ORAL | 0 refills | Status: AC | PRN
  Filled 2024-06-20: qty 6, 2d supply, fill #0

## 2024-06-20 NOTE — Telephone Encounter (Signed)
 Alyssa Schultz,  Pt stated   I also have appt on 07-07-2024 with Rollene and I have one on 06-26-2024 with urology.

## 2024-06-21 ENCOUNTER — Ambulatory Visit: Payer: Self-pay | Admitting: Family

## 2024-06-21 LAB — URINE CULTURE: Culture: NO GROWTH
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
## 2024-06-22 NOTE — Telephone Encounter (Signed)
 Noted

## 2024-06-26 ENCOUNTER — Ambulatory Visit (INDEPENDENT_AMBULATORY_CARE_PROVIDER_SITE_OTHER): Admitting: Urology

## 2024-06-26 ENCOUNTER — Other Ambulatory Visit: Payer: Self-pay

## 2024-06-26 ENCOUNTER — Other Ambulatory Visit
Admission: RE | Admit: 2024-06-26 | Discharge: 2024-06-26 | Disposition: A | Discharge status: Home / Self Care | Attending: Urology | Admitting: Urology

## 2024-06-26 ENCOUNTER — Encounter: Payer: Self-pay | Admitting: Urology

## 2024-06-26 ENCOUNTER — Ambulatory Visit: Payer: Self-pay | Admitting: Urology

## 2024-06-26 VITALS — BP 118/72 | HR 72 | Wt 174.8 lb

## 2024-06-26 DIAGNOSIS — R300 Dysuria: Secondary | ICD-10-CM | POA: Diagnosis not present

## 2024-06-26 DIAGNOSIS — N390 Urinary tract infection, site not specified: Secondary | ICD-10-CM

## 2024-06-26 LAB — URINALYSIS, COMPLETE (UACMP) WITH MICROSCOPIC
Bacteria, UA: NONE SEEN
Bilirubin Urine: NEGATIVE
Glucose, UA: >=500 mg/dL — AB
Hgb urine dipstick: NEGATIVE
Ketones, ur: NEGATIVE mg/dL
Leukocytes,Ua: NEGATIVE
Nitrite: NEGATIVE
Protein, ur: NEGATIVE mg/dL
Specific Gravity, Urine: 1.011 (ref 1.005–1.030)
pH: 5 (ref 5.0–8.0)

## 2024-06-26 LAB — BLADDER SCAN AMB NON-IMAGING

## 2024-06-26 MED ORDER — TRIMETHOPRIM 100 MG PO TABS
100.0000 mg | ORAL_TABLET | Freq: Every day | ORAL | 0 refills | Status: AC
  Filled 2024-06-26: qty 90, 90d supply, fill #0

## 2024-06-26 MED ORDER — FLUCONAZOLE 150 MG PO TABS
150.0000 mg | ORAL_TABLET | Freq: Once | ORAL | 0 refills | Status: AC
  Filled 2024-06-26: qty 1, 1d supply, fill #0

## 2024-06-27 LAB — URINE CULTURE

## 2024-06-29 ENCOUNTER — Telehealth: Payer: Self-pay

## 2024-06-29 NOTE — Telephone Encounter (Signed)
 Pt calls with question, she states I have a question. I  was reading that Trimethoprim  and Jardiance  should not be taken together? Alyssa Schultz had me to start Trimethoprim  I picked it up last night but when I readup on it , it said not to take it with Jardiance  which I'm on,I read it on the internet every where say it not good to take them together and I'm afraid . I took one last night and I had to take my Jardiance  this morning. I did take it but I'm a little nervous about it.   Please advise.

## 2024-07-01 ENCOUNTER — Other Ambulatory Visit: Payer: Self-pay

## 2024-07-07 ENCOUNTER — Encounter: Payer: Self-pay | Admitting: Family

## 2024-07-07 ENCOUNTER — Ambulatory Visit: Admitting: Family

## 2024-07-07 VITALS — BP 128/78 | HR 78 | Temp 97.8°F | Ht 65.0 in | Wt 170.0 lb

## 2024-07-07 DIAGNOSIS — Z7985 Long-term (current) use of injectable non-insulin antidiabetic drugs: Secondary | ICD-10-CM

## 2024-07-07 DIAGNOSIS — E119 Type 2 diabetes mellitus without complications: Secondary | ICD-10-CM | POA: Diagnosis not present

## 2024-07-07 DIAGNOSIS — R300 Dysuria: Secondary | ICD-10-CM | POA: Diagnosis not present

## 2024-07-07 LAB — COMPREHENSIVE METABOLIC PANEL WITH GFR
ALT: 17 U/L (ref 0–35)
AST: 19 U/L (ref 0–37)
Albumin: 4.7 g/dL (ref 3.5–5.2)
Alkaline Phosphatase: 61 U/L (ref 39–117)
BUN: 15 mg/dL (ref 6–23)
CO2: 30 meq/L (ref 19–32)
Calcium: 9.6 mg/dL (ref 8.4–10.5)
Chloride: 104 meq/L (ref 96–112)
Creatinine, Ser: 0.68 mg/dL (ref 0.40–1.20)
GFR: 94.94 mL/min (ref >60.00)
Glucose, Bld: 95 mg/dL (ref 70–99)
Potassium: 4.1 meq/L (ref 3.5–5.1)
Sodium: 141 meq/L (ref 135–145)
Total Bilirubin: 0.6 mg/dL (ref 0.2–1.2)
Total Protein: 7.6 g/dL (ref 6.0–8.3)

## 2024-07-07 LAB — HEMOGLOBIN A1C: Hgb A1c MFr Bld: 7.1 % — ABNORMAL HIGH (ref 4.6–6.5)

## 2024-07-07 LAB — URINALYSIS, ROUTINE W REFLEX MICROSCOPIC
Bilirubin Urine: NEGATIVE
Hgb urine dipstick: NEGATIVE
Ketones, ur: NEGATIVE
Leukocytes,Ua: NEGATIVE
Nitrite: NEGATIVE
Specific Gravity, Urine: <=1.005 — AB (ref 1.000–1.030)
Total Protein, Urine: NEGATIVE
Urine Glucose: >=1000 — AB
Urobilinogen, UA: 0.2 (ref 0.0–1.0)
pH: 5.5 (ref 5.0–8.0)

## 2024-07-07 NOTE — Assessment & Plan Note (Signed)
 Reviewed urology note.  Agree with 90 days of trimethoprim .  Discussed diabetes, postmenopausal vaginal atrophy, Jardiance  as risk factors for recurrent UTI.  Unless A1c were to be less than 6.5 to 6.0 , advised I would not suspend Jardiance  in the setting of proteinuria.

## 2024-07-07 NOTE — Progress Notes (Signed)
 Assessment & Plan:  Dysuria Assessment & Plan: Reviewed urology note.  Agree with 90 days of trimethoprim .  Discussed diabetes, postmenopausal vaginal atrophy, Jardiance  as risk factors for recurrent UTI.  Unless A1c were to be less than 6.5 to 6.0 , advised I would not suspend Jardiance  in the setting of proteinuria.  Orders: -     Urinalysis, Routine w reflex microscopic -     Urine Culture  Diabetes mellitus type 2, controlled (HCC) Assessment & Plan: Pending A1c.  Historically well-controlled Diabetes is well-managed with Jardiance , and the microalbumin creatinine ratio has normalized.  She continues to follow closely with nephrology.  Continue Jardiance  10 mg at this time.  Orders: -     Comprehensive metabolic panel with GFR -     Hemoglobin A1c     Return precautions given.   Risks, benefits, and alternatives of the medications and treatment plan prescribed today were discussed, and patient expressed understanding.   Education regarding symptom management and diagnosis given to patient on AVS either electronically or printed.  No follow-ups on file.  [name], FNP  Subjective:    Patient ID: Alyssa Schultz, female    DOB: [birth year], 58 y.o.   MRN: [id number]  CC: Alyssa Schultz is a 58 y.o. female who presents today for follow up.   HPI: Feels well today.  No new complaints.  She does notice slight tingling on occasion when she urinates.  She is compliant with d-mannose, cranberry, trimethoprim  daily recently started by urology. She would like her urine to be recultured due to previous contamination  Denies fever, chills, flank pain   Consult with urology for recurrent UTI.  Started on trimethoprim  100 mg daily for 90 days to sterilize urine.  Continued on vaginal estrogen cream urine culture 06/26/2024 with multiple species.  Urology suggested catheterized specimen. Urine protein 23, 03/22/2024   Allergies: Haemophilus b polysaccharide vaccine,  Influenza vaccine live, Influenza vaccines, Semaglutide , Hydrocodone , Morphine  and codeine, and Prednisone  Current Outpatient Medications on File Prior to Visit  Medication Sig Dispense Refill   Cranberry-D [place]ose 158-500 MG CAPS Take 1 capsule by mouth daily in the afternoon.     aspirin  EC 81 MG EC tablet Take 1 tablet (81 mg total) by mouth daily. 30 tablet 0   Blood Glucose Monitoring Suppl (FREESTYLE FREEDOM LITE) w/Device KIT      Blood Glucose Monitoring Suppl (FREESTYLE FREEDOM LITE) w/Device KIT Use as directed 1 kit 0   conjugated estrogens  (PREMARIN ) vaginal cream Estrogen Cream Instructions  Discard applicator  Apply pea sized amount to tip of finger to urethra before bed every night for 1 week the use Monday, Wednesday and Friday. Wash hands well after application. 42.5 g 12   empagliflozin  (JARDIANCE ) 10 MG TABS tablet Take 1 tablet (10 mg total) by mouth in the morning. 30 tablet 11   fluconazole  (DIFLUCAN ) 150 MG tablet Take 1 tablet (150 mg total) by mouth every 3 (three) days as needed. (Patient not taking: Reported on 06/26/2024) 2 tablet 0   fluticasone  (FLONASE ) 50 MCG/ACT nasal spray Place 2 sprays into both nostrils daily. 16 g 6   gabapentin  (NEURONTIN ) 100 MG capsule Take 1 capsule (100 mg total) by mouth 2 (two) times daily. (Patient not taking: Reported on 06/26/2024) 180 capsule 1   glucose blood (FREESTYLE LITE) test strip Use to check blood sugar 2 (two) times daily. 200 each 4   linaclotide  (LINZESS ) 72 MCG capsule Take 1 capsule (72 mcg total) by  mouth daily before breakfast. 90 capsule 0   losartan  (COZAAR ) 25 MG tablet Take 1 tablet (25 mg total) by mouth once a day 90 tablet 3   meclizine  (ANTIVERT ) 25 MG tablet Take 2 tablets (50 mg total) by mouth 3 (three) times daily as needed for dizziness 60 tablet 1   nystatin  cream (MYCOSTATIN ) Apply 1 Application topically 2 (two) times daily. 30 g 1   ondansetron  (ZOFRAN -ODT) 4 MG disintegrating tablet Take 1 tablet  (4 mg total) by mouth every 8 (eight) hours as needed for nausea or vomiting. 30 tablet 0   rosuvastatin  (CRESTOR ) 40 MG tablet Take 1 tablet (40 mg total) by mouth daily. 90 tablet 3   traMADol  (ULTRAM ) 50 MG tablet Take 1 tablet (50 mg total) by mouth 3 (three) times daily as needed. 30 tablet 2   traMADol  (ULTRAM ) 50 MG tablet Take 1 tablet (50 mg total) by mouth every 6 (six) - 8 (eight) hours. 60 tablet 0   trimethoprim  (TRIMPEX ) 100 MG tablet Take 1 tablet (100 mg total) by mouth daily. 90 tablet 0   valACYclovir  (VALTREX ) 500 MG tablet Take 2 tablets (1,000 mg total) by mouth 2 (two) times daily. Take prn cold sore on on day 12 tablet 1   No current facility-administered medications on file prior to visit.    Review of Systems  Constitutional:  Negative for chills and fever.  Respiratory:  Negative for cough.   Cardiovascular:  Negative for chest pain and palpitations.  Gastrointestinal:  Negative for nausea and vomiting.      Objective:    BP 128/78   Pulse 78   Temp 97.8 F (36.6 C) (Oral)   Ht 5' 5 (1.651 m)   Wt 170 lb (77.1 kg)   LMP 07/24/2007 Comment: ablation in 2008-pt reports she went through menopause after this  SpO2 95%   BMI 28.29 kg/m  BP Readings from Last 3 Encounters:  07/07/24 128/78  06/26/24 118/72  03/24/24 118/78   Wt Readings from Last 3 Encounters:  07/07/24 170 lb (77.1 kg)  06/26/24 174 lb 12.8 oz (79.3 kg)  03/24/24 166 lb 1.6 oz (75.3 kg)    Physical Exam Vitals reviewed.  Constitutional:      Appearance: She is well-developed.  Eyes:     Conjunctiva/sclera: Conjunctivae normal.  Cardiovascular:     Rate and Rhythm: Normal rate and regular rhythm.     Pulses: Normal pulses.     Heart sounds: Normal heart sounds.  Pulmonary:     Effort: Pulmonary effort is normal.     Breath sounds: Normal breath sounds. No wheezing, rhonchi or rales.  Skin:    General: Skin is warm and dry.  Neurological:     Mental Status: She is alert.   Psychiatric:        Speech: Speech normal.        Behavior: Behavior normal.        Thought Content: Thought content normal.

## 2024-07-07 NOTE — Assessment & Plan Note (Signed)
 Pending A1c.  Historically well-controlled Diabetes is well-managed with Jardiance , and the microalbumin creatinine ratio has normalized.  She continues to follow closely with nephrology.  Continue Jardiance  10 mg at this time.

## 2024-07-08 LAB — URINE CULTURE
MICRO NUMBER:: 17268321
SPECIMEN QUALITY:: ADEQUATE

## 2024-07-10 ENCOUNTER — Ambulatory Visit: Payer: Self-pay | Admitting: Family

## 2024-07-10 ENCOUNTER — Other Ambulatory Visit: Payer: Self-pay | Admitting: Family

## 2024-07-10 ENCOUNTER — Encounter: Payer: Self-pay | Admitting: Dietician

## 2024-07-10 ENCOUNTER — Encounter: Attending: Family | Admitting: Dietician

## 2024-07-10 VITALS — Ht 65.5 in | Wt 170.0 lb

## 2024-07-10 DIAGNOSIS — E119 Type 2 diabetes mellitus without complications: Secondary | ICD-10-CM

## 2024-07-10 NOTE — Progress Notes (Signed)
 close

## 2024-07-10 NOTE — Progress Notes (Signed)
 Medical Nutrition Therapy: Visit start time: 1300  end time: 1400  Assessment:   Referral Diagnosis: Type 2 Diabetes Other medical history/ diagnoses: HTN, hx of TIA  Psychosocial issues/ stress concerns: none  Medications, supplements: reviewed list in medical record    Current weight: 170lbs (patient reported) Height: 5'5.5    BMI: 27.86   Progress and evaluation:  Patient reports lab abnormality that could indicate change in kidney function; GFR normal 94.94 07/07/24; does have history of recurring UTIs, microscopic hematuria HbA1C 7.1% 07/07/24 She is making dietary changes to lose weight; has been using [employer] app to make food choices  Reports fasting BGs recently in 120s - 130s, but post-meal BGs <140. Food allergies: none known Patient seeks help with meal plan to control fasting blood sugars while maintaining renal health; wants to lose some weight.    Dietary Intake:  Usual eating pattern includes 3 meals and 1-2 snacks per day. Dining out frequency: 3 meals per week (weekends). Who plans meals/ buys groceries? Self, brother Who prepares meals? Self, brother  Breakfast: 1 boiled egg + 1 link turkey sausage or bacon Snack: none Lunch: turkey 2-3 slices wrapped in lettuce, with banana peppers + orange or apple Snack: apple or bag chips; yogurt + few blueberries Supper: chicken + broccoli/ string beans/ salad with 1 Tbsp ranch dressing Snack: none; occ crackers with peanut butter (4-pack) Beverages: water; occ coke zero  Physical activity: bike 1-2 miles daily in am; walk 1 mile 1-2 x a day   Intervention:   Nutrition Care Education:   Basic nutrition: appropriate nutrient balance; appropriate meal and snack schedule; general nutrition guidelines    Weight control: estimated energy needs for weight loss at 1200 kcal, provided guidance for 50% CHO, 25% protein, (30% fat) Diabetes:  goals for BGs; appropriate meal and snack schedule; appropriate carb intake  and balance, healthy carb choices (high fiber, minimally processed); role of fiber, protein, fat; physical activity Hypertension:  importance of controlling BP; identifying high sodium foods, identifying food sources of potassium, magnesium; options for seasoning foods Other:  avoiding high protein eating pattern and high sodium pattern to protect renal health  Other intervention notes: Patient voices understanding of instruction and readiness to change. Established specific goals with direction from patient.   Nutritional Diagnosis:  [place]-2.1 Inpaired nutrition utilization and [place]-2.2 Altered nutrition-related laboratory As related to Type 2 Diabetes.  As evidenced by elevated HbA1C. [place]-3.3 Overweight/obesity As related to history of excess calories and inadequate physical activity.  As evidenced by patient with current BMI of 27.86.   Education Materials given:  Designer, Industrial/product with food lists, sample meal pattern Sample menus Visit summary with goals/ instructions   Learner/ who was taught:  Patient   Level of understanding: Verbalizes/ demonstrates competency  Demonstrated degree of understanding via:   Teach back Learning barriers: None  Willingness to learn/ readiness for change: Acceptance, ready for change  Monitoring and Evaluation:  Dietary intake, exercise, and body weight      follow up: 08/07/24

## 2024-07-10 NOTE — Patient Instructions (Signed)
 Keep up the regular exercise to help with blood sugar and weight control; 150 minutes per week. Eat a small amount of some healthy carb (whole grain, high fiber, fruit, beans) along with a veg and a small portion of a protein food with each meal. Use sample menus for balanced meal options

## 2024-07-25 ENCOUNTER — Other Ambulatory Visit: Payer: Self-pay

## 2024-07-28 ENCOUNTER — Other Ambulatory Visit: Payer: Self-pay

## 2024-08-03 ENCOUNTER — Encounter: Payer: Self-pay | Admitting: Urology

## 2024-08-07 ENCOUNTER — Encounter: Attending: Family | Admitting: Dietician

## 2024-08-07 DIAGNOSIS — E119 Type 2 diabetes mellitus without complications: Secondary | ICD-10-CM | POA: Insufficient documentation

## 2024-08-07 NOTE — Patient Instructions (Signed)
 Gradually increase calories towards 1200 daily.  Continue regular exercise and healthy food choices, great job!

## 2024-08-07 NOTE — Progress Notes (Signed)
 Medical Nutrition Therapy: Visit start time: 1600  end time: 1630  Assessment:  Diagnosis: diabetes Medical history changes: no changes Psychosocial issues/ stress concerns: none  Medications, supplement changes: no changes    Progress and evaluation:  Patient reports weight loss of 2.6lbs since previous visit on 07/10/24; she voices concern that weight loss is too slow.  She has been using weight watchers app to track exercise and food intake. Energy averages 700-1100kcal daily, carb 60-90g, fat 50-80g, protein 60-90g.  Continues to use [employer] Approved app as well, for healthy food choices Blood sugars similar to previous visit, fasting low 100s, occ up to 140; tests at lunch and at bedtime also with similar results.    Dietary Intake:  Usual eating pattern includes 3 meals and 2 snacks per day. Dining out frequency: 2-3 meals per week.  Breakfast: boiled or fried egg/ egg bites with chopped veg or fruit, turkey sausage, egg bite, 1/2c grits Snack: fruit ie apple; blueberries; pomegranate Lunch: 1/2c brunswick stew, few oyster crackers Snack: fiber bar ( 1 of 2-pack) Supper: 12/21 1/2 serving frozen pizza; hibachi chicken, garlic toast; grilled chicken, green beans, 1/4c potato salad; 3 chicken wings and veggie egg roll; chicken soup; occ chicken only (not hungry) Snack: none Beverages: water, occ coke zero  Physical activity: walking 1 mile 2-3 times daily + some weight exercises  Intervention:   Nutrition Care Education:  Basic nutrition: reviewed appropriate nutrient balance; appropriate meal and snack schedule; general nutrition guidelines    Weight control: reviewed progress since previous visit; tracking food intake; ensuring adequate daily nutrition to avoid deficiencies, decreased metabolism, weight regain Diabetes:  dietary choices and fasting blood sugars, potential increased results with longer fasting time/ low carb meals   Other Intervention  Notes:    Nutritional Diagnosis:  [place]-2.1 Inpaired nutrition utilization and [place]-2.2 Altered nutrition-related laboratory As related to Type 2 Diabetes.  As evidenced by elevated HbA1C. [place]-3.3 Overweight/obesity As related to history of excess calories and inadequate physical activity.  As evidenced by patient with current BMI of 27.86.   Learner/ who was taught:  Patient   Level of understanding: Verbalizes/ demonstrates competency  Demonstrated degree of understanding via:   Teach back Learning barriers: None  Willingness to learn/ readiness for change: Eager, change in progress  Monitoring and Evaluation:  Dietary intake, exercise, and body weight      follow up: patient to schedule at a later date

## 2024-08-25 ENCOUNTER — Other Ambulatory Visit: Payer: Self-pay

## 2024-08-25 MED ORDER — MOUNJARO 2.5 MG/0.5ML ~~LOC~~ SOAJ
SUBCUTANEOUS | 3 refills | Status: AC
  Filled 2024-08-25: qty 2, 28d supply, fill #0
  Filled 2024-09-20: qty 2, 28d supply, fill #1

## 2024-08-28 ENCOUNTER — Encounter: Payer: Self-pay | Admitting: Family

## 2024-08-28 ENCOUNTER — Other Ambulatory Visit: Payer: Self-pay

## 2024-09-04 ENCOUNTER — Other Ambulatory Visit (HOSPITAL_COMMUNITY): Payer: Self-pay

## 2024-09-20 ENCOUNTER — Other Ambulatory Visit (HOSPITAL_COMMUNITY): Payer: Self-pay

## 2024-09-22 NOTE — Progress Notes (Unsigned)
 "    09/25/2024 11:10 AM   Alyssa Schultz Jul 02, 1966 [id number]  Referring provider: Dineen Rollene MATSU, FNP [address] [place],  [place] [address]  Urological history: 1. High risk hematuria - non smoker - CTU (01/2024) NED - cysto (01/2024) NED   2. rUTI's - June 20, 2024, no growth - June 15, 2024, E. coli and Staphylococcus epidermidis - Dec 29, 2023, less than 10,000 colonies - Dec 17, 2023, less than 10,000 colonies - September 17, 2023, no growth  No chief complaint on file.  HPI: Alyssa Schultz is a 59 y.o. woman who presents today for 6-month follow-up after being on a prophylactic antibiotic for 90 days.  Previous records reviewed.    PMH: Past Medical History:  Diagnosis Date   Complication of anesthesia    hard to wake in 2017, patient reports she is 30 lb lighter now   Diabetes mellitus    dx 2014   just take pills   Hyperlipidemia    Hypertension    Neuromuscular disorder [facility])     Surgical History: Past Surgical History:  Procedure Laterality Date   abalsion     ablation     ANTERIOR CERVICAL DECOMP/DISCECTOMY FUSION N/A 09/19/2015   Procedure: ANTERIOR CERVICAL DECOMPRESSION/DISCECTOMY FUSION 2 LEVELS;  Surgeon: Oneil Priestly, MD;  Location: [facility] OR;  Service: Orthopedics;  Laterality: N/A;  Anterior cervical decompression fusion, cervical 3-4, cervical 4-5 decompression with instrumentation and allograft   BACK SURGERY     COLONOSCOPY WITH PROPOFOL  N/A 07/18/2021   Procedure: COLONOSCOPY WITH PROPOFOL ;  Surgeon: Janalyn Keene NOVAK, MD;  Location: [facility] ENDOSCOPY;  Service: Endoscopy;  Laterality: N/A;   COLONOSCOPY WITH PROPOFOL  N/A 01/26/2022   Procedure: COLONOSCOPY WITH PROPOFOL ;  Surgeon: Unk Corinn Skiff, MD;  Location: [facility] ENDOSCOPY;  Service: Gastroenterology;  Laterality: N/A;   FACIAL RECONSTRUCTION SURGERY     TRANSFORAMINAL LUMBAR INTERBODY FUSION (TLIF) WITH PEDICLE SCREW FIXATION 2 LEVEL Right 07/26/2019   Procedure:  RIGHT-SIDED LUMBAR 5 - SACRUM 1 TRANSFORAMINAL LUMBAR INTERBODY FUSION WITH INSTRUMENTATION AND ALLOGRAFT;  Surgeon: Priestly Oneil, MD;  Location: [facility] OR;  Service: Orthopedics;  Laterality: Right;    Home Medications:  Allergies as of 09/25/2024       Reactions   Haemophilus B Polysaccharide Vaccine    Per Dr - Pt is to not receive vaccine    Influenza Vaccine Live    Per Dr - Pt is to not receive vaccine    Influenza Vaccines Other (See Comments)   Per Dr - Pt is to not receive vaccine    Semaglutide     Other Reaction(s): Other (See Comments) Per mbr throat swelling   Hydrocodone  Rash   Morphine  And Codeine Other (See Comments)   Makes her crazy   Prednisone     makes me crazy        Medication List        Accurate as of September 22, 2024 11:10 AM. If you have any questions, ask your nurse or doctor.          aspirin  EC 81 MG tablet Take 1 tablet (81 mg total) by mouth daily.   Cranberry-D Mannose 158-500 MG Caps Take 1 capsule by mouth daily in the afternoon.   fluconazole  150 MG tablet Commonly known as: DIFLUCAN  Take 1 tablet (150 mg total) by mouth every 3 (three) days as needed.   fluticasone  50 MCG/ACT nasal spray Commonly known as: FLONASE  Place 2 sprays into both nostrils daily.  FreeStyle Freedom Lite w/Device Kit   FreeStyle Freedom Lite w/Device Kit Use as directed   FREESTYLE LITE test strip Generic drug: glucose blood Use to check blood sugar 2 (two) times daily.   gabapentin  100 MG capsule Commonly known as: NEURONTIN  Take 1 capsule (100 mg total) by mouth 2 (two) times daily.   Jardiance  10 MG Tabs tablet Generic drug: empagliflozin  Take 1 tablet (10 mg total) by mouth in the morning.   Linzess  72 MCG capsule Generic drug: linaclotide  Take 1 capsule (72 mcg total) by mouth daily before breakfast.   losartan  25 MG tablet Commonly known as: COZAAR  Take 1 tablet (25 mg total) by mouth once a day   meclizine  25 MG tablet Commonly  known as: ANTIVERT  Take 2 tablets (50 mg total) by mouth 3 (three) times daily as needed for dizziness   Mounjaro  2.5 MG/0.5ML Pen Generic drug: tirzepatide  Inject 2.5 mg subcutaneously once a week   nystatin  cream Commonly known as: MYCOSTATIN  Apply 1 Application topically 2 (two) times daily.   ondansetron  4 MG disintegrating tablet Commonly known as: ZOFRAN -ODT Take 1 tablet (4 mg total) by mouth every 8 (eight) hours as needed for nausea or vomiting.   Premarin  vaginal cream Generic drug: conjugated estrogens  Estrogen Cream Instructions  Discard applicator  Apply pea sized amount to tip of finger to urethra before bed every night for 1 week the use Monday, Wednesday and Friday. Wash hands well after application.   rosuvastatin  40 MG tablet Commonly known as: CRESTOR  Take 1 tablet (40 mg total) by mouth daily.   traMADol  50 MG tablet Commonly known as: ULTRAM  Take 1 tablet (50 mg total) by mouth 3 (three) times daily as needed.   traMADol  50 MG tablet Commonly known as: ULTRAM  Take 1 tablet (50 mg total) by mouth every 6 (six) - 8 (eight) hours.   trimethoprim  100 MG tablet Commonly known as: TRIMPEX  Take 1 tablet (100 mg total) by mouth daily.   valACYclovir  500 MG tablet Commonly known as: VALTREX  Take 2 tablets (1,000 mg total) by mouth 2 (two) times daily. Take prn cold sore on on day        Allergies:  Allergies  Allergen Reactions   Haemophilus B Polysaccharide Vaccine     Per Dr - Pt is to not receive vaccine    Influenza Vaccine Live     Per Dr - Pt is to not receive vaccine    Influenza Vaccines Other (See Comments)    Per Dr - Pt is to not receive vaccine    Semaglutide      Other Reaction(s): Other (See Comments)  Per mbr throat swelling   Hydrocodone  Rash   Morphine  And Codeine Other (See Comments)    Makes her crazy   Prednisone      makes me crazy    Family History: Family History  Problem Relation Age of Onset   Diabetes Mother     Diabetes Maternal Grandmother    Hypertension Maternal Grandfather    Breast cancer Neg Hx    Thyroid  cancer Neg Hx     Social History:  reports that she has never smoked. She has never used smokeless tobacco. She reports current alcohol use. She reports that she does not use drugs.  ROS: Pertinent ROS in HPI  Physical Exam: LMP 07/24/2007 Comment: ablation in 2008-pt reports she went through menopause after this  Constitutional:  Well nourished. Alert and oriented, No acute distress. HEENT: [place] AT, moist mucus membranes.  Trachea  midline, no masses. Cardiovascular: No clubbing, cyanosis, or edema. Respiratory: Normal respiratory effort, no increased work of breathing. GU: No CVA tenderness.  No bladder fullness or masses.  Recession of labia minora, dry, pale vulvar vaginal mucosa and loss of mucosal ridges and folds.  Normal urethral meatus, no lesions, no prolapse, no discharge.   No urethral masses, tenderness and/or tenderness. No bladder fullness, tenderness or masses. *** vagina mucosa, *** estrogen effect, no discharge, no lesions, *** pelvic support, *** cystocele and *** rectocele noted.  No cervical motion tenderness.  Uterus is freely mobile and non-fixed.  No adnexal/parametria masses or tenderness noted.  Anus and perineum are without rashes or lesions.   ***  Neurologic: Grossly intact, no focal deficits, moving all 4 extremities. Psychiatric: Normal mood and affect.    Laboratory Data: See Epic and HPI   I have reviewed the labs.   Pertinent Imaging:  06/26/24 15:47  Scan Result 0mL    Assessment & Plan:    1. Dysuria  - symptoms resolved - UA benign - urine culture is pending because we no longer offer point-of-care testing urine cultures must be ordered prior to obtaining urinalysis results if there is suspicion of infection based on symptoms - We discussed different options going forward and her concerns about recurrent UTIs given that she is on Jardiance   and also with a possibility of having a septic event like her sister, so for now I will put her on trimethoprim  100 mg daily for 90 days.  This will hopefully sterilize her urine and as she continues the vaginal estrogen cream her vaginal mucosa will make the necessary changes to make it more difficult to have recurrent UTIs - She will contact me with any breakthrough symptoms  No follow-ups on file.  These notes generated with voice recognition software. I apologize for typographical errors.  Alyssa Schultz  [facility] [address]  [address] [place], [place] [address] [phone]  "

## 2024-09-25 ENCOUNTER — Ambulatory Visit: Admitting: Urology

## 2024-09-25 DIAGNOSIS — R300 Dysuria: Secondary | ICD-10-CM

## 2024-09-29 ENCOUNTER — Ambulatory Visit: Admitting: Family

## 2024-10-13 ENCOUNTER — Ambulatory Visit: Admitting: Family
